# Patient Record
Sex: Female | Born: 1960
Health system: Southern US, Community
[De-identification: ages and names within clinical notes are randomized; demographics above are authoritative.]

## PROBLEM LIST (undated history)

## (undated) DIAGNOSIS — F419 Anxiety disorder, unspecified: Secondary | ICD-10-CM

## (undated) DIAGNOSIS — F32A Depression, unspecified: Secondary | ICD-10-CM

## (undated) DIAGNOSIS — M1990 Unspecified osteoarthritis, unspecified site: Secondary | ICD-10-CM

## (undated) DIAGNOSIS — T7840XA Allergy, unspecified, initial encounter: Secondary | ICD-10-CM

## (undated) DIAGNOSIS — E669 Obesity, unspecified: Secondary | ICD-10-CM

## (undated) DIAGNOSIS — F329 Major depressive disorder, single episode, unspecified: Secondary | ICD-10-CM

## (undated) DIAGNOSIS — M329 Systemic lupus erythematosus, unspecified: Secondary | ICD-10-CM

## (undated) DIAGNOSIS — R350 Frequency of micturition: Secondary | ICD-10-CM

## (undated) DIAGNOSIS — K219 Gastro-esophageal reflux disease without esophagitis: Secondary | ICD-10-CM

## (undated) DIAGNOSIS — I7300 Raynaud's syndrome without gangrene: Secondary | ICD-10-CM

## (undated) DIAGNOSIS — F17201 Nicotine dependence, unspecified, in remission: Secondary | ICD-10-CM

## (undated) DIAGNOSIS — M349 Systemic sclerosis, unspecified: Secondary | ICD-10-CM

## (undated) DIAGNOSIS — IMO0002 Reserved for concepts with insufficient information to code with codable children: Secondary | ICD-10-CM

## (undated) DIAGNOSIS — R7303 Prediabetes: Secondary | ICD-10-CM

## (undated) DIAGNOSIS — K5731 Diverticulosis of large intestine without perforation or abscess with bleeding: Secondary | ICD-10-CM

## (undated) DIAGNOSIS — I10 Essential (primary) hypertension: Secondary | ICD-10-CM

## (undated) DIAGNOSIS — I739 Peripheral vascular disease, unspecified: Secondary | ICD-10-CM

## (undated) DIAGNOSIS — F418 Other specified anxiety disorders: Secondary | ICD-10-CM

## (undated) DIAGNOSIS — K5730 Diverticulosis of large intestine without perforation or abscess without bleeding: Secondary | ICD-10-CM

## (undated) HISTORY — DX: Systemic sclerosis, unspecified: M34.9

## (undated) HISTORY — PX: VASCULAR SURGERY: SHX849

## (undated) HISTORY — DX: Raynaud's syndrome without gangrene: I73.00

## (undated) HISTORY — DX: Gastro-esophageal reflux disease without esophagitis: K21.9

## (undated) HISTORY — DX: Systemic lupus erythematosus, unspecified: M32.9

## (undated) HISTORY — DX: Other specified anxiety disorders: F41.8

## (undated) HISTORY — PX: FINGER AMPUTATION: SHX636

## (undated) HISTORY — DX: Nicotine dependence, unspecified, in remission: F17.201

## (undated) HISTORY — DX: Depression, unspecified: F32.A

## (undated) HISTORY — DX: Essential (primary) hypertension: I10

## (undated) HISTORY — DX: Major depressive disorder, single episode, unspecified: F32.9

## (undated) HISTORY — DX: Allergy, unspecified, initial encounter: T78.40XA

## (undated) HISTORY — DX: Prediabetes: R73.03

## (undated) HISTORY — DX: Anxiety disorder, unspecified: F41.9

## (undated) HISTORY — DX: Obesity, unspecified: E66.9

## (undated) HISTORY — PX: HERNIA REPAIR: SHX51

## (undated) HISTORY — DX: Reserved for concepts with insufficient information to code with codable children: IMO0002

## (undated) HISTORY — PX: TUBAL LIGATION: SHX77

---

## 1988-03-07 HISTORY — PX: ABDOMINAL HYSTERECTOMY: SHX81

## 1988-03-07 HISTORY — PX: HAND SURGERY: SHX662

## 1994-03-07 DIAGNOSIS — I10 Essential (primary) hypertension: Secondary | ICD-10-CM

## 1994-03-07 HISTORY — DX: Essential (primary) hypertension: I10

## 1996-03-07 DIAGNOSIS — M329 Systemic lupus erythematosus, unspecified: Secondary | ICD-10-CM

## 1996-03-07 DIAGNOSIS — M349 Systemic sclerosis, unspecified: Secondary | ICD-10-CM

## 1996-03-07 HISTORY — DX: Systemic lupus erythematosus, unspecified: M32.9

## 1996-03-07 HISTORY — DX: Systemic sclerosis, unspecified: M34.9

## 1999-07-30 ENCOUNTER — Encounter: Payer: Self-pay | Admitting: Family Medicine

## 1999-07-30 ENCOUNTER — Ambulatory Visit (HOSPITAL_COMMUNITY): Admission: RE | Admit: 1999-07-30 | Discharge: 1999-07-30 | Payer: Self-pay | Admitting: Family Medicine

## 2000-10-28 ENCOUNTER — Encounter: Payer: Self-pay | Admitting: Emergency Medicine

## 2000-10-28 ENCOUNTER — Emergency Department (HOSPITAL_COMMUNITY): Admission: EM | Admit: 2000-10-28 | Discharge: 2000-10-28 | Payer: Self-pay | Admitting: Emergency Medicine

## 2000-11-17 ENCOUNTER — Emergency Department (HOSPITAL_COMMUNITY): Admission: EM | Admit: 2000-11-17 | Discharge: 2000-11-17 | Payer: Self-pay | Admitting: *Deleted

## 2000-11-17 ENCOUNTER — Encounter: Payer: Self-pay | Admitting: *Deleted

## 2000-12-28 ENCOUNTER — Encounter: Admission: RE | Admit: 2000-12-28 | Discharge: 2000-12-28 | Payer: Self-pay | Admitting: Family Medicine

## 2000-12-28 ENCOUNTER — Encounter: Payer: Self-pay | Admitting: Family Medicine

## 2001-01-17 ENCOUNTER — Other Ambulatory Visit: Admission: RE | Admit: 2001-01-17 | Discharge: 2001-01-17 | Payer: Self-pay | Admitting: Obstetrics and Gynecology

## 2001-03-27 ENCOUNTER — Emergency Department (HOSPITAL_COMMUNITY): Admission: EM | Admit: 2001-03-27 | Discharge: 2001-03-27 | Payer: Self-pay | Admitting: Emergency Medicine

## 2001-03-27 ENCOUNTER — Encounter: Payer: Self-pay | Admitting: Emergency Medicine

## 2002-03-22 ENCOUNTER — Emergency Department (HOSPITAL_COMMUNITY): Admission: EM | Admit: 2002-03-22 | Discharge: 2002-03-22 | Payer: Self-pay | Admitting: Emergency Medicine

## 2002-03-29 ENCOUNTER — Ambulatory Visit (HOSPITAL_COMMUNITY): Admission: RE | Admit: 2002-03-29 | Discharge: 2002-03-29 | Payer: Self-pay | Admitting: Family Medicine

## 2002-03-29 ENCOUNTER — Encounter: Payer: Self-pay | Admitting: Family Medicine

## 2002-05-02 ENCOUNTER — Emergency Department (HOSPITAL_COMMUNITY): Admission: EM | Admit: 2002-05-02 | Discharge: 2002-05-02 | Payer: Self-pay | Admitting: Pediatrics

## 2003-01-15 ENCOUNTER — Ambulatory Visit (HOSPITAL_COMMUNITY): Admission: RE | Admit: 2003-01-15 | Discharge: 2003-01-15 | Payer: Self-pay | Admitting: Obstetrics and Gynecology

## 2003-02-17 ENCOUNTER — Emergency Department (HOSPITAL_COMMUNITY): Admission: EM | Admit: 2003-02-17 | Discharge: 2003-02-17 | Payer: Self-pay | Admitting: Emergency Medicine

## 2003-03-08 HISTORY — PX: COLONOSCOPY: SHX174

## 2003-06-19 ENCOUNTER — Ambulatory Visit (HOSPITAL_COMMUNITY): Admission: RE | Admit: 2003-06-19 | Discharge: 2003-06-19 | Payer: Self-pay | Admitting: Family Medicine

## 2003-10-31 ENCOUNTER — Emergency Department (HOSPITAL_COMMUNITY): Admission: EM | Admit: 2003-10-31 | Discharge: 2003-10-31 | Payer: Self-pay | Admitting: Emergency Medicine

## 2003-11-13 ENCOUNTER — Ambulatory Visit (HOSPITAL_COMMUNITY): Admission: RE | Admit: 2003-11-13 | Discharge: 2003-11-13 | Payer: Self-pay | Admitting: Internal Medicine

## 2003-12-05 ENCOUNTER — Encounter: Payer: Self-pay | Admitting: Family Medicine

## 2003-12-05 ENCOUNTER — Ambulatory Visit (HOSPITAL_COMMUNITY): Admission: RE | Admit: 2003-12-05 | Discharge: 2003-12-05 | Payer: Self-pay | Admitting: Internal Medicine

## 2004-10-04 ENCOUNTER — Ambulatory Visit (HOSPITAL_COMMUNITY): Admission: RE | Admit: 2004-10-04 | Discharge: 2004-10-04 | Payer: Self-pay | Admitting: Family Medicine

## 2004-12-08 ENCOUNTER — Emergency Department (HOSPITAL_COMMUNITY): Admission: EM | Admit: 2004-12-08 | Discharge: 2004-12-08 | Payer: Self-pay | Admitting: Emergency Medicine

## 2005-10-20 ENCOUNTER — Ambulatory Visit (HOSPITAL_COMMUNITY): Admission: RE | Admit: 2005-10-20 | Discharge: 2005-10-20 | Payer: Self-pay | Admitting: Family Medicine

## 2006-11-14 ENCOUNTER — Ambulatory Visit (HOSPITAL_COMMUNITY): Admission: RE | Admit: 2006-11-14 | Discharge: 2006-11-14 | Payer: Self-pay | Admitting: Family Medicine

## 2006-12-13 ENCOUNTER — Other Ambulatory Visit: Admission: RE | Admit: 2006-12-13 | Discharge: 2006-12-13 | Payer: Self-pay | Admitting: Obstetrics and Gynecology

## 2007-03-19 ENCOUNTER — Encounter (HOSPITAL_COMMUNITY): Admission: RE | Admit: 2007-03-19 | Discharge: 2007-04-18 | Payer: Self-pay | Admitting: Family Medicine

## 2007-06-06 ENCOUNTER — Emergency Department (HOSPITAL_COMMUNITY): Admission: EM | Admit: 2007-06-06 | Discharge: 2007-06-06 | Payer: Self-pay | Admitting: Emergency Medicine

## 2007-06-12 ENCOUNTER — Encounter: Admission: RE | Admit: 2007-06-12 | Discharge: 2007-06-12 | Payer: Self-pay | Admitting: Family Medicine

## 2007-06-22 ENCOUNTER — Ambulatory Visit (HOSPITAL_COMMUNITY): Admission: RE | Admit: 2007-06-22 | Discharge: 2007-06-22 | Payer: Self-pay | Admitting: Family Medicine

## 2007-08-11 ENCOUNTER — Emergency Department (HOSPITAL_COMMUNITY): Admission: EM | Admit: 2007-08-11 | Discharge: 2007-08-11 | Payer: Self-pay | Admitting: Emergency Medicine

## 2007-12-04 ENCOUNTER — Ambulatory Visit (HOSPITAL_COMMUNITY): Admission: RE | Admit: 2007-12-04 | Discharge: 2007-12-04 | Payer: Self-pay | Admitting: Family Medicine

## 2008-01-15 ENCOUNTER — Other Ambulatory Visit: Admission: RE | Admit: 2008-01-15 | Discharge: 2008-01-15 | Payer: Self-pay | Admitting: Obstetrics and Gynecology

## 2008-05-14 ENCOUNTER — Emergency Department (HOSPITAL_COMMUNITY): Admission: EM | Admit: 2008-05-14 | Discharge: 2008-05-14 | Payer: Self-pay | Admitting: Emergency Medicine

## 2008-05-27 ENCOUNTER — Emergency Department (HOSPITAL_COMMUNITY): Admission: EM | Admit: 2008-05-27 | Discharge: 2008-05-27 | Payer: Self-pay | Admitting: Emergency Medicine

## 2008-12-17 ENCOUNTER — Ambulatory Visit (HOSPITAL_COMMUNITY): Admission: RE | Admit: 2008-12-17 | Discharge: 2008-12-17 | Payer: Self-pay | Admitting: Family Medicine

## 2009-01-23 ENCOUNTER — Encounter: Payer: Self-pay | Admitting: Family Medicine

## 2009-01-23 ENCOUNTER — Other Ambulatory Visit: Admission: RE | Admit: 2009-01-23 | Discharge: 2009-01-23 | Payer: Self-pay | Admitting: Obstetrics and Gynecology

## 2009-04-02 ENCOUNTER — Ambulatory Visit: Payer: Self-pay | Admitting: Family Medicine

## 2009-04-02 DIAGNOSIS — F329 Major depressive disorder, single episode, unspecified: Secondary | ICD-10-CM

## 2009-04-02 DIAGNOSIS — I10 Essential (primary) hypertension: Secondary | ICD-10-CM

## 2009-04-27 ENCOUNTER — Telehealth: Payer: Self-pay | Admitting: Family Medicine

## 2009-05-01 ENCOUNTER — Ambulatory Visit: Payer: Self-pay | Admitting: Family Medicine

## 2009-05-01 DIAGNOSIS — R51 Headache: Secondary | ICD-10-CM

## 2009-05-01 DIAGNOSIS — R519 Headache, unspecified: Secondary | ICD-10-CM | POA: Insufficient documentation

## 2009-05-20 ENCOUNTER — Telehealth: Payer: Self-pay | Admitting: Family Medicine

## 2009-05-21 ENCOUNTER — Ambulatory Visit: Payer: Self-pay | Admitting: Family Medicine

## 2009-06-04 ENCOUNTER — Ambulatory Visit: Payer: Self-pay | Admitting: Family Medicine

## 2009-07-14 ENCOUNTER — Emergency Department (HOSPITAL_COMMUNITY): Admission: EM | Admit: 2009-07-14 | Discharge: 2009-07-14 | Payer: Self-pay | Admitting: Emergency Medicine

## 2009-08-06 ENCOUNTER — Ambulatory Visit: Payer: Self-pay | Admitting: Family Medicine

## 2009-08-14 ENCOUNTER — Ambulatory Visit: Payer: Self-pay | Admitting: Family Medicine

## 2009-08-14 DIAGNOSIS — G4700 Insomnia, unspecified: Secondary | ICD-10-CM

## 2009-08-20 ENCOUNTER — Ambulatory Visit (HOSPITAL_COMMUNITY): Admission: RE | Admit: 2009-08-20 | Discharge: 2009-08-20 | Payer: Self-pay | Admitting: Family Medicine

## 2009-08-20 ENCOUNTER — Ambulatory Visit: Payer: Self-pay | Admitting: Family Medicine

## 2009-08-20 DIAGNOSIS — K3189 Other diseases of stomach and duodenum: Secondary | ICD-10-CM

## 2009-08-20 DIAGNOSIS — R05 Cough: Secondary | ICD-10-CM

## 2009-08-20 DIAGNOSIS — R110 Nausea: Secondary | ICD-10-CM

## 2009-08-20 DIAGNOSIS — R1013 Epigastric pain: Secondary | ICD-10-CM

## 2009-08-20 DIAGNOSIS — R5383 Other fatigue: Secondary | ICD-10-CM | POA: Insufficient documentation

## 2009-08-20 LAB — CONVERTED CEMR LAB
Basophils Absolute: 0.1 10*3/uL (ref 0.0–0.1)
Basophils Relative: 1 % (ref 0–1)
Eosinophils Absolute: 0.1 10*3/uL (ref 0.0–0.7)
Eosinophils Relative: 1 % (ref 0–5)
HCT: 39 % (ref 36.0–46.0)
Lymphocytes Relative: 19 % (ref 12–46)
MCHC: 33.3 g/dL (ref 30.0–36.0)
Platelets: 428 10*3/uL — ABNORMAL HIGH (ref 150–400)
RDW: 13.3 % (ref 11.5–15.5)

## 2009-08-26 ENCOUNTER — Ambulatory Visit (HOSPITAL_COMMUNITY): Admission: RE | Admit: 2009-08-26 | Discharge: 2009-08-26 | Payer: Self-pay | Admitting: Family Medicine

## 2009-08-26 ENCOUNTER — Ambulatory Visit: Payer: Self-pay | Admitting: Family Medicine

## 2009-08-31 ENCOUNTER — Encounter: Payer: Self-pay | Admitting: Family Medicine

## 2009-09-04 ENCOUNTER — Ambulatory Visit (HOSPITAL_COMMUNITY): Admission: RE | Admit: 2009-09-04 | Discharge: 2009-09-04 | Payer: Self-pay | Admitting: Pulmonary Disease

## 2009-09-10 ENCOUNTER — Ambulatory Visit: Payer: Self-pay | Admitting: Family Medicine

## 2009-11-10 ENCOUNTER — Telehealth: Payer: Self-pay | Admitting: Family Medicine

## 2009-11-12 ENCOUNTER — Ambulatory Visit: Payer: Self-pay | Admitting: Family Medicine

## 2009-11-12 DIAGNOSIS — G4730 Sleep apnea, unspecified: Secondary | ICD-10-CM | POA: Insufficient documentation

## 2009-11-12 DIAGNOSIS — J209 Acute bronchitis, unspecified: Secondary | ICD-10-CM

## 2009-12-07 ENCOUNTER — Encounter: Payer: Self-pay | Admitting: Family Medicine

## 2009-12-24 ENCOUNTER — Encounter: Payer: Self-pay | Admitting: Family Medicine

## 2009-12-24 ENCOUNTER — Ambulatory Visit: Admission: RE | Admit: 2009-12-24 | Discharge: 2009-12-24 | Payer: Self-pay | Admitting: Family Medicine

## 2009-12-24 ENCOUNTER — Ambulatory Visit (HOSPITAL_COMMUNITY): Admission: RE | Admit: 2009-12-24 | Discharge: 2009-12-24 | Payer: Self-pay | Admitting: Family Medicine

## 2010-01-14 ENCOUNTER — Encounter: Payer: Self-pay | Admitting: Family Medicine

## 2010-01-25 ENCOUNTER — Emergency Department (HOSPITAL_COMMUNITY): Admission: EM | Admit: 2010-01-25 | Discharge: 2010-01-25 | Payer: Self-pay | Admitting: Emergency Medicine

## 2010-01-26 ENCOUNTER — Encounter: Payer: Self-pay | Admitting: Family Medicine

## 2010-01-26 ENCOUNTER — Other Ambulatory Visit: Admission: RE | Admit: 2010-01-26 | Discharge: 2010-01-26 | Payer: Self-pay | Admitting: Obstetrics and Gynecology

## 2010-01-27 ENCOUNTER — Encounter: Payer: Self-pay | Admitting: Family Medicine

## 2010-03-16 ENCOUNTER — Ambulatory Visit
Admission: RE | Admit: 2010-03-16 | Discharge: 2010-03-16 | Payer: Self-pay | Source: Home / Self Care | Attending: Family Medicine | Admitting: Family Medicine

## 2010-03-29 ENCOUNTER — Encounter: Payer: Self-pay | Admitting: Family Medicine

## 2010-04-08 NOTE — Assessment & Plan Note (Signed)
Summary: sick -room 2   Vital Signs:  Patient profile:   50 year old female Height:      66 inches Weight:      233.25 pounds BMI:     37.78 O2 Sat:      98 % on Room air Pulse rate:   93 / minute Resp:     16 per minute BP sitting:   100 / 68  (left arm)  Vitals Entered By: Adella Hare LPN (August 06, 1608 8:50 AM) CC: cough, chills Is Patient Diabetic? No Pain Assessment Patient in pain? no      Comments did not bring meds to ov   CC:  cough and chills.  History of Present Illness: Pt presents today with c/o a cough. She developed hoarseness 5 days ago & it has persisted.  A couple days later she started with a  cough.  The cough has worsened & is productive with thick discolored phlegm.  She occasionally is blowing her nose, but states she doesn't really have much nasal congestion. No post nasal drainage. No fever.  She is not using an over the counter cold meds.   Allergies (verified): 1)  ! Codeine  Past History:  Past medical history reviewed for relevance to current acute and chronic problems.  Past Medical History: Reviewed history from 04/02/2009 and no changes required. Hypertension x 1996 depression [facility]  1998 scleroderma  Dr bland  1998 Disabled since  1998 OBESITY  Review of Systems General:  Denies chills and fever. ENT:  Denies earache, nasal congestion, postnasal drainage, sinus pressure, and sore throat. CV:  Denies chest pain or discomfort. Resp:  Complains of cough and sputum productive; denies shortness of breath and wheezing.  Physical Exam  General:  Well-developed,well-nourished,in no acute distress; alert,appropriate and cooperative throughout examination Head:  Normocephalic and atraumatic without obvious abnormalities. No apparent alopecia or balding. Ears:  External ear exam shows no significant lesions or deformities.  Otoscopic examination reveals clear canals, tympanic membranes are intact bilaterally without  bulging, retraction, inflammation or discharge. Hearing is grossly normal bilaterally. Nose:  External nasal examination shows no deformity or inflammation. Nasal mucosa are pink and moist without lesions or exudates. Mouth:  Oral mucosa and oropharynx without lesions or exudates.  Hoarse voice noted. Neck:  No deformities, masses, or tenderness noted. Lungs:  Coarse BS bilat. normal respiratory effort, no intercostal retractions, no accessory muscle use, no crackles, and no wheezes.   Heart:  Normal rate and regular rhythm. S1 and S2 normal without gallop, murmur, click, rub or other extra sounds. Cervical Nodes:  No lymphadenopathy noted Psych:  Cognition and judgment appear intact. Alert and cooperative with normal attention span and concentration. No apparent delusions, illusions, hallucinations   Impression & Recommendations:  Problem # 1:  ACUTE BRONCHITIS (ICD-466.0) Assessment New  Her updated medication list for this problem includes:    Septra Ds 800-160 Mg Tabs (Sulfamethoxazole-trimethoprim) .Marland Kitchen... Take 1 two times a day for 10 days    Benzonatate 100 Mg Caps (Benzonatate) .Marland Kitchen... Take 1 capsule every 8 hrs as needed for cough  Problem # 2:  LARYNGITIS, ACUTE (ICD-464.00) Assessment: New  Problem # 3:  UNSPECIFIED ESSENTIAL HYPERTENSION (ICD-401.9) Assessment: Comment Only  Her updated medication list for this problem includes:    Amlodipine Besylate 10 Mg Tabs (Amlodipine besylate) .Marland Kitchen... Take 1 tablet by mouth once a day  BP today: 100/68 Prior BP: 110/80 (06/04/2009)  Complete Medication List: 1)  Amlodipine Besylate 10 Mg Tabs (Amlodipine besylate) .... Take 1 tablet by mouth once a day 2)  Zolpidem Tartrate 10 Mg Tabs (Zolpidem tartrate) .... Take 1 tab by mouth at bedtime 3)  Nexium 40 Mg Cpdr (Esomeprazole magnesium) .... Take 1 tablet by mouth once a day 4)  Tramadol Hcl 50 Mg Tabs (Tramadol hcl) .Marland Kitchen.. 1 or 2 every 6 hrs as needed 5)  Zoloft 50 Mg Tabs (Sertraline  hcl) .... Take 1 tablet by mouth once a day 6)  Alprazolam 1 Mg Tabs (Alprazolam) .... Take 1 tablet by mouth three times a day 7)  Septra Ds 800-160 Mg Tabs (Sulfamethoxazole-trimethoprim) .... Take 1 two times a day for 10 days 8)  Benzonatate 100 Mg Caps (Benzonatate) .... Take 1 capsule every 8 hrs as needed for cough 9)  Fluconazole 150 Mg Tabs (Fluconazole) .... Take 1 by mouth  Patient Instructions: 1)  Keep your next appt. 2)  Get plenty of rest, drink lots of clear liquids, and use Tylenol or Ibuprofen for fever and comfort. Return in 7-10 days if you're not better:sooner if you're feeling worse. 3)  Increase your fluids and voice rest for your laryngitis. 4)  I have prescribed an antibiotic and cough pill for you. 5)  It is important that you exercise regularly at least 20 minutes 5 times a week. If you develop chest pain, have severe difficulty breathing, or feel very tired , stop exercising immediately and seek medical attention. 6)  You need to lose weight. Consider a lower calorie diet and regular exercise.  Prescriptions: FLUCONAZOLE 150 MG TABS (FLUCONAZOLE) take 1 by mouth  #1 x 0   Entered and Authorized by:   Esperanza Sheets PA   Signed by:   Esperanza Sheets PA on 08/06/2009   Method used:   Electronically to        [facility] (retail)       [address]       [place], [place]  [address]       Ph: [phone] or [phone]       Fax: [phone]   RxID:   [id number] BENZONATATE 100 MG CAPS (BENZONATATE) take 1 capsule every 8 hrs as needed for cough  #30 x 0   Entered and Authorized by:   Esperanza Sheets PA   Signed by:   Esperanza Sheets PA on 08/06/2009   Method used:   Electronically to        [facility] (retail)       [address]       [place], [place]  [address]       Ph: [phone] or [phone]       Fax: [phone]   RxID:   [id number] SEPTRA DS 800-160 MG TABS  (SULFAMETHOXAZOLE-TRIMETHOPRIM) take 1 two times a day for 10 days  #20 x 0   Entered and Authorized by:   Esperanza Sheets PA   Signed by:   Esperanza Sheets PA on 08/06/2009   Method used:   Electronically to        [facility] (retail)       [address]       [place], [place]  [address]       Ph: [phone] or [phone]       Fax: [phone]   RxID:   [id number]

## 2010-04-08 NOTE — Assessment & Plan Note (Signed)
Summary: SICK- room 2   Vital Signs:  Patient profile:   50 year old female Height:      66 inches Weight:      233 pounds BMI:     37.74 Pulse rate:   77 / minute Resp:     16 per minute BP sitting:   100 / 70  (left arm)  Vitals Entered By: Adella Hare LPN (May 21, 2009 4:45 PM) CC: cough and runny nose Is Patient Diabetic? No Pain Assessment Patient in pain? no        CC:  cough and runny nose.  History of Present Illness: Pt is here today with c/o cough.  This started 1-2 wks ago.  Feels like what she had in Jan & the medicines Dr Lodema Hong gave her worked well.  The cough is mostly nonprod, but occasionally brings up some clear phlegm.  Also having some nasal congestion & drainage.  No fever or chills.  Pt is frustrated that she gained wt since her last visit here.  States she has been eating healthier. Eating salads for lunch with vegetable crackers.  Current Medications (verified): 1)  Amlodipine Besylate 10 Mg Tabs (Amlodipine Besylate) .... Take 1 Tablet By Mouth Once A Day 2)  Zolpidem Tartrate 10 Mg Tabs (Zolpidem Tartrate) .... Take 1 Tab By Mouth At Bedtime 3)  Nexium 40 Mg Cpdr (Esomeprazole Magnesium) .... Take 1 Tablet By Mouth Once A Day 4)  Tramadol Hcl 50 Mg Tabs (Tramadol Hcl) .Marland Kitchen.. 1 or 2 Every 6 Hrs As Needed 5)  Zoloft 50 Mg Tabs (Sertraline Hcl) .... Take 1 Tablet By Mouth Once A Day 6)  Alprazolam 1 Mg Tabs (Alprazolam) .... Take 1 Tablet By Mouth Three Times A Day 7)  Septra Ds 800-160 Mg Tabs (Sulfamethoxazole-Trimethoprim) .... Take 1 Tablet By Mouth Two Times A Day 8)  Tessalon Perles 100 Mg Caps (Benzonatate) .... Take 1 Capsule By Mouth Three Times A Day 9)  Fluconazole 150 Mg Tabs (Fluconazole) .... Take 1 Tablet By Mouth Once A Day As Needed  Allergies (verified): 1)  ! Codeine  Past History:  Past medical history reviewed for relevance to current acute and chronic problems.  Past Medical History: Reviewed history from 04/02/2009 and  no changes required. Hypertension x 1996 [facility]  [facility]  1998 scleroderma  Dr bland  1998 Disabled since  1998 OBESITY  Review of Systems General:  Denies chills and fever. ENT:  Complains of nasal congestion and postnasal drainage; denies earache, sinus pressure, and sore throat. CV:  Denies chest pain or discomfort and palpitations. Resp:  Complains of cough and sputum productive; denies shortness of breath and wheezing. Heme:  Denies enlarge lymph nodes.  Physical Exam  General:  Well-developed,well-nourished,in no acute distress; alert,appropriate and cooperative throughout examination  Hoarse voice noted. Head:  Normocephalic and atraumatic without obvious abnormalities. No apparent alopecia or balding. Ears:  External ear exam shows no significant lesions or deformities.  Otoscopic examination reveals clear canals, tympanic membranes are intact bilaterally without bulging, retraction, inflammation or discharge. Hearing is grossly normal bilaterally. Nose:  External nasal examination shows no deformity or inflammation. Nasal mucosa are pink and moist without lesions or exudates. Mouth:  Oral mucosa and oropharynx without lesions or exudates.  Teeth in good repair. Neck:  No deformities, masses, or tenderness noted. Lungs:  Normal respiratory effort, chest expands symmetrically. Lungs are clear to auscultation, no crackles or wheezes. Heart:  Normal rate and regular rhythm. S1  and S2 normal without gallop, murmur, click, rub or other extra sounds. Cervical Nodes:  No lymphadenopathy noted Psych:  Cognition and judgment appear intact. Alert and cooperative with normal attention span and concentration. No apparent delusions, illusions, hallucinations   Impression & Recommendations:  Problem # 1:  ACUTE SINUSITIS, UNSPECIFIED (ICD-461.9) Assessment New  Her updated medication list for this problem includes:    Septra Ds 800-160 Mg Tabs  (Sulfamethoxazole-trimethoprim) .Marland Kitchen... Take 1 tablet by mouth two times a day    Tessalon Perles 100 Mg Caps (Benzonatate) .Marland Kitchen... Take 1 capsule by mouth three times a day  Problem # 2:  ACUTE BRONCHITIS (ICD-466.0) Assessment: New  Her updated medication list for this problem includes:    Septra Ds 800-160 Mg Tabs (Sulfamethoxazole-trimethoprim) .Marland Kitchen... Take 1 tablet by mouth two times a day    Tessalon Perles 100 Mg Caps (Benzonatate) .Marland Kitchen... Take 1 capsule by mouth three times a day  Problem # 3:  OBESITY (ICD-278.00) Assessment: Deteriorated  Ht: 66 (05/21/2009)   Wt: 233 (05/21/2009)   BMI: 37.74 (05/21/2009)  Discussed diet.  Specifically salads & hidden calories & fat, even though they seem healthy.  Also discussed with her that the vegetable crackers that she is eating are mostly carbs.  She needs to look at the nutrition label to see what the serving size is, calories, etc.  Complete Medication List: 1)  Amlodipine Besylate 10 Mg Tabs (Amlodipine besylate) .... Take 1 tablet by mouth once a day 2)  Zolpidem Tartrate 10 Mg Tabs (Zolpidem tartrate) .... Take 1 tab by mouth at bedtime 3)  Nexium 40 Mg Cpdr (Esomeprazole magnesium) .... Take 1 tablet by mouth once a day 4)  Tramadol Hcl 50 Mg Tabs (Tramadol hcl) .Marland Kitchen.. 1 or 2 every 6 hrs as needed 5)  Zoloft 50 Mg Tabs (Sertraline hcl) .... Take 1 tablet by mouth once a day 6)  Alprazolam 1 Mg Tabs (Alprazolam) .... Take 1 tablet by mouth three times a day 7)  Septra Ds 800-160 Mg Tabs (Sulfamethoxazole-trimethoprim) .... Take 1 tablet by mouth two times a day 8)  Tessalon Perles 100 Mg Caps (Benzonatate) .... Take 1 capsule by mouth three times a day 9)  Fluconazole 150 Mg Tabs (Fluconazole) .... Take 1 tablet by mouth once a day as needed  Patient Instructions: 1)  Please schedule a follow-up appointment as needed. 2)  Get plenty of rest, drink lots of clear liquids, and use Tylenol or Ibuprofen for fever and comfort. Return in 7-10 days  if you're not better:sooner if you're feeling worse. 3)  I have sent your prescriptions to [facility]. Prescriptions: SEPTRA DS 800-160 MG TABS (SULFAMETHOXAZOLE-TRIMETHOPRIM) Take 1 tablet by mouth two times a day  #20 x 0   Entered and Authorized by:   Esperanza Sheets PA   Signed by:   Esperanza Sheets PA on 05/21/2009   Method used:   Electronically to        [facility] (retail)       [address]       [place], [place]  [address]       Ph: [phone] or [phone]       Fax: [phone]   RxID:   [id number] TESSALON PERLES 100 MG CAPS (BENZONATATE) Take 1 capsule by mouth three times a day  #30 x 0   Entered and Authorized by:   Esperanza Sheets PA   Signed by:   Esperanza Sheets  PA on 05/21/2009   Method used:   Electronically to        [facility] (retail)       [address]       [place], [place]  [address]       Ph: [phone] or [phone]       Fax: [phone]   RxID:   [id number] FLUCONAZOLE 150 MG TABS (FLUCONAZOLE) Take 1 tablet by mouth once a day as needed  #2 x 0   Entered and Authorized by:   Esperanza Sheets PA   Signed by:   Esperanza Sheets PA on 05/21/2009   Method used:   Electronically to        [facility] (retail)       [address]       [place], [place]  [address]       Ph: [phone] or [phone]       Fax: [phone]   RxID:   [id number]

## 2010-04-08 NOTE — Assessment & Plan Note (Signed)
Summary: F UP   Vital Signs:  Patient profile:   50 year old female Menstrual status:  hysterectomy Height:      66 inches Weight:      228.75 pounds BMI:     37.05 Pulse rate:   73 / minute Pulse rhythm:   regular Resp:     16 per minute BP sitting:   110 / 80  (left arm)  Vitals Entered By: Adella Hare LPN (September 11, 8411 11:08 AM)  Nutrition Counseling: Patient's BMI is greater than 25 and therefore counseled on weight management options. CC: follow-up visit Is Patient Diabetic? No Pain Assessment Patient in pain? no      Comments did not bring meds to ov     Menstrual Status hysterectomy   CC:  follow-up visit.  History of Present Illness: Pt reports that her cough is much better, she has seen the pulmonary specialist who prescribed no new meds, questioned the possibility of pulmonary ds, a restrictive type based on her rheumatologic history, and orderd pulmonary function testing. The reports of the study is non commitalas far as any pulmonary ds is concerned. thept has 2 separate dx iin her medical record, and i would like clarification and so does she. She reportedly has both lupus and the crest synd, attemp[ts will be mande to obtain relevant documents to sort this out. Shedenies depression oranxiety, she takes meds and sees a Energy manager. seh denies any fever or chills, excessive head congestion or sore throat. She does have postr nasal drainage, and also occasional cough which is for the most part , non productive.   Allergies (verified): 1)  ! Codeine  Review of Systems      See HPI General:  Complains of fatigue and sleep disorder; controlled by medivcation. Eyes:  Denies discharge and red eye. CV:  Denies chest pain or discomfort, palpitations, and swelling of feet. GI:  Denies abdominal pain, constipation, diarrhea, nausea, and vomiting. GU:  Denies dysuria and urinary frequency. MS:  Denies joint pain and stiffness. Endo:  Denies cold  intolerance, excessive hunger, excessive thirst, excessive urination, heat intolerance, polyuria, and weight change. Heme:  Denies abnormal bruising and bleeding. Allergy:  Complains of seasonal allergies.  Physical Exam  General:  Well-developed,overweight,in no acute distress; alert,appropriate and cooperative throughout examination HEENT: No facial asymmetry,  EOMI, No sinus tenderness, TM's Clear, oropharynx  pink and moist. Mild erythema and edema of nasal mucosa  Chest: Clear to auscultation bilaterally.  CVS: S1, S2, No murmurs, No S3.   Abd: Soft, Nontender.  MS: Adequate ROM spine, hips, shoulders and knees.  Ext: No edema.   CNS: CN 2-12 intact, power tone and sensation normal throughout.   Skin: Intact, no visible lesions or rashes.  Psych: Good eye contact, normal affect.  Memory intact, not anxious or depressed appearing.    Impression & Recommendations:  Problem # 1:  COUGH (ICD-786.2) Assessment Improved my feeling is that the pt may have been infected with bordetella  Problem # 2:  INSOMNIA (ICD-780.52) Assessment: Improved  Her updated medication list for this problem includes:    Zolpidem Tartrate 10 Mg Tabs (Zolpidem tartrate) .Marland Kitchen... Take 1 tab by mouth at bedtime  Discussed sleep hygiene.   Problem # 3:  DEPRESSION (ICD-311) Assessment: Improved  Her updated medication list for this problem includes:    Zoloft 50 Mg Tabs (Sertraline hcl) .Marland Kitchen... Take 1 tablet by mouth once a day    Alprazolam 1 Mg Tabs (Alprazolam) .Marland KitchenMarland KitchenMarland KitchenMarland Kitchen  Take 1 tablet by mouth three times a day  Problem # 4:  UNSPECIFIED ESSENTIAL HYPERTENSION (ICD-401.9) Assessment: Unchanged  Her updated medication list for this problem includes:    Amlodipine Besylate 10 Mg Tabs (Amlodipine besylate) .Marland Kitchen... Take 1 tablet by mouth once a day  BP today: 110/80 Prior BP: 112/60 (08/26/2009)  Problem # 5:  OBESITY (ICD-278.00) Assessment: Deteriorated  Ht: 66 (09/10/2009)   Wt: 228.75 (09/10/2009)    BMI: 37.05 (09/10/2009)  Complete Medication List: 1)  Amlodipine Besylate 10 Mg Tabs (Amlodipine besylate) .... Take 1 tablet by mouth once a day 2)  Zolpidem Tartrate 10 Mg Tabs (Zolpidem tartrate) .... Take 1 tab by mouth at bedtime 3)  Nexium 40 Mg Cpdr (Esomeprazole magnesium) .... Take 1 tablet by mouth once a day 4)  Tramadol Hcl 50 Mg Tabs (Tramadol hcl) .Marland Kitchen.. 1 or 2 every 6 hrs as needed 5)  Zoloft 50 Mg Tabs (Sertraline hcl) .... Take 1 tablet by mouth once a day 6)  Alprazolam 1 Mg Tabs (Alprazolam) .... Take 1 tablet by mouth three times a day 7)  Clarithromycin 500 Mg Tabs (Clarithromycin) .... Take 1 tablet by mouth two times a day 8)  Ventolin Hfa 108 (90 Base) Mcg/act Aers (Albuterol sulfate) .... Use 2 puffs every 4 hours as needed 9)  Hydrocodone-acetaminophen 5-500 Mg Tabs (Hydrocodone-acetaminophen) .... Take 1 every 6 hrs as needed for cough and rib pain  Patient Instructions: 1)  Please schedule a follow-up appointment in 2 months. 2)  It is important that you exercise regularly at least 20 minutes 5 times a week. If you develop chest pain, have severe difficulty breathing, or feel very tired , stop exercising immediately and seek medical attention. 3)  You need to lose weight. Consider a lower calorie diet and regular exercise.  4)  I will send for your record from [facility]. 5)  i will send for the lung function study you recently had done

## 2010-04-08 NOTE — Op Note (Signed)
Summary: colonoscpy with biopsy  colonoscpy with biopsy   Imported By: Lind Guest 04/07/2009 09:49:46  _____________________________________________________________________  External Attachment:    Type:   Image     Comment:   External Document

## 2010-04-08 NOTE — Miscellaneous (Signed)
Summary: FLU SHOT  Clinical Lists Changes  Observations: Added new observation of FLU VAX: Fluvax Non-MCR (12/04/2009 11:07)      Influenza Immunization History:    Influenza # 1:  Fluvax Non-MCR (12/04/2009)

## 2010-04-08 NOTE — Assessment & Plan Note (Signed)
Summary: sick?- room 1   Vital Signs:  Patient profile:   50 year old female O2 Sat:      97 % on Room air Pulse rate:   90 / minute Resp:     16 per minute BP sitting:   112 / 60  (left arm)  Vitals Entered By: Adella Hare LPN (August 26, 2009 1:39 PM) CC: dizzy and feeling bad Is Patient Diabetic? No Comments did not bring meds to ov   CC:  dizzy and feeling bad.  History of Present Illness: Pt presents today with c/o cough.  This is her 4th visit in the last 20 days for the same.  Cough is worse with lying down.  Spits up clear phlegm.She has taken 2 different antibiotics and has had Depo Medrol and oral steroids without improvement.  Pt states the cough makes her throw up. Also vomits if eats or drinks anything.  Hydrocodone some help with cough, and was able to sleep.  Also tried Tessalon, but no help.  Rib cage is now hurting too.  Worse with cough and deep breath.  Pts CBC and CXR neg. Abd [place] also Neg.  A HIDA scan has been ordered for her.   Allergies (verified): 1)  ! Codeine  Past History:  Past medical history reviewed for relevance to current acute and chronic problems.  Past Medical History: Reviewed history from 04/02/2009 and no changes required. Hypertension x 1996 depression anxiety [facility]ERD [facility]  [facility]  1998 scleroderma  Dr bland  1998 Disabled since  1998 OBESITY  Review of Systems General:  Denies chills and fever. ENT:  Complains of nasal congestion; denies earache, postnasal drainage, sinus pressure, and sore throat. CV:  Complains of chest pain or discomfort; denies palpitations. Resp:  Complains of cough, shortness of breath, and sputum productive; denies wheezing.  Physical Exam  General:  alert and overweight-appearing.  Frequent cough noted during visit and pt appears to not feel well but  NAD.  Head:  Normocephalic and atraumatic without obvious abnormalities. No apparent alopecia or balding. Ears:  External ear exam shows no significant  lesions or deformities.  Otoscopic examination reveals clear canals, tympanic membranes are intact bilaterally without bulging, retraction, inflammation or discharge. Hearing is grossly normal bilaterally. Nose:  External nasal examination shows no deformity or inflammation. Nasal mucosa are pink and moist without lesions or exudates. Mouth:  Oral mucosa and oropharynx without lesions or exudates.  Teeth in good repair. Neck:  No deformities, masses, or tenderness noted. Lungs:  Normal respiratory effort, chest expands symmetrically. Lungs are clear to auscultation, no crackles or wheezes.  After NMT lungs are still CTA but pt does have improved air movement and cough is noted to have stopped. Heart:  Normal rate and regular rhythm. S1 and S2 normal without gallop, murmur, click, rub or other extra sounds. Cervical Nodes:  No lymphadenopathy noted Psych:  Cognition and judgment appear intact. Alert and cooperative with normal attention span and concentration. No apparent delusions, illusions, hallucinations   Impression & Recommendations:  Problem # 1:  COUGH (ICD-786.2) Assessment Unchanged Pt to complete current antibiotic prescription.   Rxd albuterol inhaler to use every 4 hrs as needed. Orders: Pulmonary Referral (Pulmonary) Nebulizer Tx ([address])  Complete Medication List: 1)  Amlodipine Besylate 10 Mg Tabs (Amlodipine besylate) .... Take 1 tablet by mouth once a day 2)  Zolpidem Tartrate 10 Mg Tabs (Zolpidem tartrate) .... Take 1 tab by mouth at bedtime 3)  Nexium 40 Mg Cpdr (  Esomeprazole magnesium) .... Take 1 tablet by mouth once a day 4)  Tramadol Hcl 50 Mg Tabs (Tramadol hcl) .Marland Kitchen.. 1 or 2 every 6 hrs as needed 5)  Zoloft 50 Mg Tabs (Sertraline hcl) .... Take 1 tablet by mouth once a day 6)  Alprazolam 1 Mg Tabs (Alprazolam) .... Take 1 tablet by mouth three times a day 7)  Clarithromycin 500 Mg Tabs (Clarithromycin) .... Take 1 tablet by mouth two times a day 8)  Ventolin Hfa 108  (90 Base) Mcg/act Aers (Albuterol sulfate) .... Use 2 puffs every 4 hours as needed 9)  Hydrocodone-acetaminophen 5-500 Mg Tabs (Hydrocodone-acetaminophen) .... Take 1 every 6 hrs as needed for cough and rib pain  Patient Instructions: 1)  Please schedule a follow-up appointment as needed. 2)  I have referred you to a pulmonologist (lung dr). 3)  I have refilled the Hydrocodone for cough.  This will also help with your rib pain. 4)  I have prescribed an inhaler for you to use. Prescriptions: HYDROCODONE-ACETAMINOPHEN 5-500 MG TABS (HYDROCODONE-ACETAMINOPHEN) take 1 every 6 hrs as needed for cough and rib pain  #30 x 0   Entered and Authorized by:   Esperanza Sheets PA   Signed by:   Esperanza Sheets PA on 08/26/2009   Method used:   Printed then faxed to ...       [facility]* (retail)       [address]       [place], [place]  [address]       Ph: [phone] or [phone]       Fax: [phone]   RxID:   [id number] VENTOLIN HFA 108 (90 BASE) MCG/ACT AERS (ALBUTEROL SULFATE) use 2 puffs every 4 hours as needed  #1 x 0   Entered and Authorized by:   Esperanza Sheets PA   Signed by:   Esperanza Sheets PA on 08/26/2009   Method used:   Electronically to        [facility]* (retail)       [address]       [place], [place]  [address]       Ph: [phone] or [phone]       Fax: [phone]   RxID:   [id number]  hydrocodone was given verbally to [facility]  Appended Document: sick?- room 1   Medication Administration  Medication # 1:    Medication: Albuterol Sulfate Sol 1mg  unit dose    Diagnosis: COUGH (ICD-786.2)    Dose: 2.5    Route: inhaled    Exp Date: 8/11    Lot #: [id number]    Mfr: nephron pharm    Patient tolerated medication without complications    Given by: Adella Hare LPN (August 26, 2009 4:40 PM)  Orders Added: 1)  Albuterol Sulfate Sol 1mg  unit dose [J7613] 2)  Nebulizer Tx [85462]

## 2010-04-08 NOTE — Letter (Signed)
Summary: NOCTURNAL POLYSOMNOGRAM  NOCTURNAL POLYSOMNOGRAM   Imported By: Lind Guest 01/14/2010 10:27:27  _____________________________________________________________________  External Attachment:    Type:   Image     Comment:   External Document

## 2010-04-08 NOTE — Assessment & Plan Note (Signed)
Summary: FLU   Vital Signs:  Patient profile:   50 year old female Height:      66 inches Weight:      230 pounds BMI:     37.26 O2 Sat:      96 % Temp:     98.7 degrees F oral Pulse rate:   77 / minute Pulse rhythm:   regular Resp:     16 per minute BP sitting:   110 / 88  (right arm)  Vitals Entered By: Everitt Amber LPN (May 01, 2009 10:34 AM)  Nutrition Counseling: Patient's BMI is greater than 25 and therefore counseled on weight management options. CC: Cough, keeping up all night, is able to bring up green phlegm, chills, hot and cold flashes. Just started yesetrday   CC:  Cough, keeping up all night, is able to bring up green phlegm, chills, and hot and cold flashes. Just started yesetrday.  History of Present Illness: 2 day h/o acute head and chest congestion , cough , body achesfeverabnd chills. Positive sick contact. Prior to this she was well.  Current Medications (verified): 1)  Amlodipine Besylate 10 Mg Tabs (Amlodipine Besylate) .... Take 1 Tablet By Mouth Once A Day 2)  Zolpidem Tartrate 10 Mg Tabs (Zolpidem Tartrate) .... Take 1 Tab By Mouth At Bedtime 3)  Nexium 40 Mg Cpdr (Esomeprazole Magnesium) .... Take 1 Tablet By Mouth Once A Day 4)  Tramadol Hcl 50 Mg Tabs (Tramadol Hcl) .Marland Kitchen.. 1 or 2 Every 6 Hrs As Needed 5)  Zoloft 50 Mg Tabs (Sertraline Hcl) .... Take 1 Tablet By Mouth Once A Day 6)  Alprazolam 1 Mg Tabs (Alprazolam) .... Take 1 Tablet By Mouth Three Times A Day  Allergies (verified): 1)  ! Codeine  Review of Systems      See HPI General:  Complains of chills, fatigue, fever, and malaise. Eyes:  Denies discharge and red eye. ENT:  Complains of nasal congestion, postnasal drainage, and sinus pressure; denies sore throat. CV:  Denies chest pain or discomfort and palpitations. Resp:  Complains of cough and sputum productive. GI:  Denies abdominal pain, constipation, diarrhea, nausea, and vomiting. GU:  Denies dysuria and urinary frequency. MS:   Complains of joint pain, muscle aches, and muscle weakness. Derm:  Denies itching, lesion(s), and rash. Neuro:  Complains of headaches; denies seizures and sensation of room spinning. Psych:  Complains of anxiety and depression; denies suicidal thoughts/plans, thoughts of violence, and unusual visions or sounds. Endo:  Denies cold intolerance, excessive hunger, excessive thirst, excessive urination, heat intolerance, polyuria, and weight change. Heme:  Denies abnormal bruising and bleeding. Allergy:  Denies hives or rash and sneezing.  Physical Exam  General:  Well-developed,obese,in no acute distress; alert,appropriate and cooperative throughout examination. ill appearing. HEENT: No facial asymmetry,  EOMI, posive frontal sinus tenderness, TM's Clear, oropharynx  pink and moist.   Chest: decreased air entry, scattered crackles no wheezes CVS: S1, S2, No murmurs, No S3.   Abd: Soft, Nontender.  MS: Adequate ROM spine, hips, shoulders and knees.  Ext: No edema.   CNS: CN 2-12 intact, power tone and sensation normal throughout.   Skin: Intact, no visible lesions or rashes.  Psych: Good eye contact, normal affect.  Memory intact, not anxious or depressed appearing.      Impression & Recommendations:  Problem # 1:  ACUTE SINUSITIS, UNSPECIFIED (ICD-461.9) Assessment Comment Only  Her updated medication list for this problem includes:    Septra Ds 800-160 Mg Tabs (  Sulfamethoxazole-trimethoprim) .Marland Kitchen... Take 1 tablet by mouth two times a day    Tessalon Perles 100 Mg Caps (Benzonatate) .Marland Kitchen... Take 1 capsule by mouth three times a day rocephin 500mg  Im administered  Orders: Rocephin  250mg  (W4315) Admin of Therapeutic Inj  intramuscular or subcutaneous (40086)  Problem # 2:  ACUTE BRONCHITIS (ICD-466.0) Assessment: Comment Only  Her updated medication list for this problem includes:    Septra Ds 800-160 Mg Tabs (Sulfamethoxazole-trimethoprim) .Marland Kitchen... Take 1 tablet by mouth two times a  day    Tessalon Perles 100 Mg Caps (Benzonatate) .Marland Kitchen... Take 1 capsule by mouth three times a day Rocephin 500mg  im administered  Orders: Depo- Medrol 80mg  (J1040)  Problem # 3:  HEADACHE (ICD-784.0) Assessment: Comment Only  Her updated medication list for this problem includes:    Tramadol Hcl 50 Mg Tabs (Tramadol hcl) .Marland Kitchen... 1 or 2 every 6 hrs as needed Toradol 60 mg im and depomedrol 80mg  iM administered  Orders: Ketorolac-Toradol 15mg  (P6195)  Problem # 4:  OBESITY (ICD-278.00) Assessment: Unchanged  Ht: 66 (05/01/2009)   Wt: 230 (05/01/2009)   BMI: 37.26 (05/01/2009)  Problem # 5:  UNSPECIFIED ESSENTIAL HYPERTENSION (ICD-401.9) Assessment: Improved  Her updated medication list for this problem includes:    Amlodipine Besylate 10 Mg Tabs (Amlodipine besylate) .Marland Kitchen... Take 1 tablet by mouth once a day  BP today: 110/88 Prior BP: 120/90 (04/02/2009)  Complete Medication List: 1)  Amlodipine Besylate 10 Mg Tabs (Amlodipine besylate) .... Take 1 tablet by mouth once a day 2)  Zolpidem Tartrate 10 Mg Tabs (Zolpidem tartrate) .... Take 1 tab by mouth at bedtime 3)  Nexium 40 Mg Cpdr (Esomeprazole magnesium) .... Take 1 tablet by mouth once a day 4)  Tramadol Hcl 50 Mg Tabs (Tramadol hcl) .Marland Kitchen.. 1 or 2 every 6 hrs as needed 5)  Zoloft 50 Mg Tabs (Sertraline hcl) .... Take 1 tablet by mouth once a day 6)  Alprazolam 1 Mg Tabs (Alprazolam) .... Take 1 tablet by mouth three times a day 7)  Septra Ds 800-160 Mg Tabs (Sulfamethoxazole-trimethoprim) .... Take 1 tablet by mouth two times a day 8)  Tessalon Perles 100 Mg Caps (Benzonatate) .... Take 1 capsule by mouth three times a day 9)  Fluconazole 150 Mg Tabs (Fluconazole) .... Take 1 tablet by mouth once a day as needed  Patient Instructions: 1)  f/u as before. 2)  you are being treated for acute sinusitis and bronchitis. you will get injections in the office and meds are also sent to your pharmacy 3)  Get plenty of rest, drink lots  of clear liquids, and use Tylenol or Ibuprofen for fever and comfort. calln in 7-10 days if you're not better:sooner if you're feeling worse. 4)  you will get injections today in the office and meds are also sent to your pharmacy.  Prescriptions: SEPTRA DS 800-160 MG TABS (SULFAMETHOXAZOLE-TRIMETHOPRIM) Take 1 tablet by mouth two times a day  #20 x 0   Entered by:   Adella Hare LPN   Authorized by:   Syliva Overman MD   Signed by:   Adella Hare LPN on 09/32/6712   Method used:   Electronically to        [facility]* (retail)       [address]       [place], [place]  [address]       Ph: [phone] or [phone]       Fax:  [phone]   RxID:   [id number] TESSALON PERLES 100 MG CAPS (BENZONATATE) Take 1 capsule by mouth three times a day  #30 x 0   Entered by:   Adella Hare LPN   Authorized by:   Syliva Overman MD   Signed by:   Adella Hare LPN on 30/86/5784   Method used:   Electronically to        [facility]* (retail)       [address]       [place], [place]  [address]       Ph: [phone] or [phone]       Fax: [phone]   RxID:   [id number] FLUCONAZOLE 150 MG TABS (FLUCONAZOLE) Take 1 tablet by mouth once a day as needed  #2 x 0   Entered by:   Adella Hare LPN   Authorized by:   Syliva Overman MD   Signed by:   Adella Hare LPN on 16/60/6301   Method used:   Electronically to        [facility]* (retail)       [address]       [place], [place]  [address]       Ph: [phone] or [phone]       Fax: [phone]   RxID:   [id number] FLUCONAZOLE 150 MG TABS (FLUCONAZOLE) Take 1 tablet by mouth once a day as needed  #2 x 0   Entered and Authorized by:   Syliva Overman MD   Signed by:   Syliva Overman MD on 05/01/2009   Method used:   Electronically to        [facility] (retail)       [address]       [place], [place]  [address]       Ph: [phone]       Fax: [phone]   RxID:   [id number] TESSALON PERLES 100 MG CAPS (BENZONATATE) Take 1 capsule by mouth three times a day  #30 x 0   Entered and Authorized by:   Syliva Overman MD   Signed by:   Syliva Overman MD on 05/01/2009   Method used:   Electronically to        [facility] (retail)       [address]       [place], [place]  [address]       Ph: [phone]       Fax: [phone]   RxID:   [id number] SEPTRA DS 800-160 MG TABS (SULFAMETHOXAZOLE-TRIMETHOPRIM) Take 1 tablet by mouth two times a day  #20 x 0   Entered and Authorized by:   Syliva Overman MD   Signed by:   Syliva Overman MD on 05/01/2009   Method used:   Electronically to        [facility] (retail)       [address]       [place], [place]  [address]       Ph: [phone]       Fax: [phone]   RxID:   [id number]   Prevention & Chronic Care Immunizations   Influenza vaccine: Not documented    Tetanus booster: Not documented  Pneumococcal vaccine: Not documented  Colorectal Screening   Hemoccult: Not documented    Colonoscopy: Not documented  Other Screening   Pap smear: Not documented    Mammogram: ASSESSMENT: Negative - BI-RADS 1^MM DIGITAL SCREENING  (12/17/2008)   Smoking status: Not documented  Lipids   Total Cholesterol: Not documented   LDL: Not documented   LDL Direct: Not documented   HDL: Not documented   Triglycerides: Not documented  Hypertension   Last Blood Pressure: 110 / 88  (05/01/2009)   Serum creatinine: Not documented   Serum potassium Not documented  Self-Management Support :    Hypertension self-management support: Not documented   Medication Administration  Injection # 1:    Medication: Depo- Medrol 80mg     Diagnosis: ACUTE BRONCHITIS (ICD-466.0)    Route: IM    Site: RUOQ gluteus    Exp Date:  01/2010    Lot #: [id number]    Mfr: Pharmacia    Comments: 80mg  given     Patient tolerated injection without complications    Given by: Everitt Amber LPN (May 01, 2009 12:05 PM)  Injection # 2:    Medication: Ketorolac-Toradol 15mg     Diagnosis: HEADACHE (ICD-784.0)    Route: IM    Site: RUOQ gluteus    Exp Date: 10/2010    Lot #: [id number]    Mfr: [email or web address]    Comments: 60mg  given     Patient tolerated injection without complications    Given by: Everitt Amber LPN (May 01, 2009 12:05 PM)  Injection # 3:    Medication: Rocephin  250mg     Diagnosis: ACUTE SINUSITIS, UNSPECIFIED (ICD-461.9)    Route: IM    Site: RUOQ gluteus    Exp Date: 06/2011    Lot #: [id number]    Mfr: [email or web address]    Comments: 500mg  given     Patient tolerated injection without complications    Given by: Everitt Amber LPN (May 01, 2009 12:06 PM)  Orders Added: 1)  Est. Patient Level IV [04540] 2)  Depo- Medrol 80mg  [J1040] 3)  Ketorolac-Toradol 15mg  [J1885] 4)  Rocephin  250mg  [J0696] 5)  Admin of Therapeutic Inj  intramuscular or subcutaneous [98119]

## 2010-04-08 NOTE — Assessment & Plan Note (Signed)
Summary: new patient   Vital Signs:  Patient profile:   50 year old female Height:      66 inches Weight:      230.25 pounds BMI:     37.30 O2 Sat:      96 % Pulse rate:   76 / minute Pulse rhythm:   regular Resp:     16 per minute BP sitting:   120 / 90  (right arm)  Vitals Entered By: Everitt Amber (April 02, 2009 2:04 PM)  Nutrition Counseling: Patient's BMI is greater than 25 and therefore counseled on weight management options. CC: New Patient, mom is in nursing home and she has alot on her and she is really stressed out, began crying when going over family history   CC:  New Patient, mom is in nursing home and she has alot on her and she is really stressed out, and began crying when going over family history.  History of Present Illness: This is a new pt evaluation , for this middle aged female wih problems of severe depression and isolation from her family. She reports beng stressed out , seemingly the only one responsible for her mother's routine f/u in the nursing home, since her other siblings work.She has a strained relationship with 1 son who was taken from herin her youth because of her wild behavior , she feels that from that time her family did not stand up for her, and recently at a fam gathering she states she was told in the open that a gentleman to whom she was engaged wa family so they could not marry, hi was in 2008. Since that time she has been to no family gatherings. She remains withdrawn and depressed, and in August wa suicidal, she has been in therapy since, which has helped.  she does not exercise and is aware of the need to do so as well as to change her diet to facilitate weight loss and improvedhealth.   Her cancer screening tests are reportedly up to date , and attempts will be made to obtain them.     Reports  that she is otherwise doing well. Denies recent fever or chills. Denies sinus pressure, nasal congestion , ear pain or sore throat. Denies  chest congestion, or cough productive of sputum. Denies chest pain, palpitations, PND, orthopnea or leg swelling. Denies abdominal pain, nausea, vomitting, diarrhea or constipation. Denies change in bowel movements or bloody stool. Denies dysuria , frequency, incontinence or hesitancy. Denies  joint pain, swelling, or reduced mobility. Denies headaches, vertigo, seizures.  Denies  rash, lesions, or itch.     Preventive Screening-Counseling & Management      Drug Use:  no.    Current Medications (verified): 1)  Amlodipine Besylate 10 Mg Tabs (Amlodipine Besylate) .... Take 1 Tablet By Mouth Once A Day 2)  Zolpidem Tartrate 10 Mg Tabs (Zolpidem Tartrate) .... Take 1 Tab By Mouth At Bedtime 3)  Nexium 40 Mg Cpdr (Esomeprazole Magnesium) .... Take 1 Tablet By Mouth Once A Day 4)  Tramadol Hcl 50 Mg Tabs (Tramadol Hcl) .Marland Kitchen.. 1 or 2 Every 6 Hrs As Needed 5)  Alprazolam 1 Mg Tabs (Alprazolam) .... Take 1 Tablet By Mouth Three Times A Day  Allergies (verified): No Known Drug Allergies  Past History:  Past Medical History: Hypertension x 1996 depression anxiety [facility]ERD [facility]  1998 scleroderma  Dr bland  1998 Disabled since  1998 OBESITY  Past Surgical History: Surgery (middle fingers) on both  hands, removed tips   [facility] Hyste[facility] x 20 years  cancer Caesarean section x 2  vAscular surgery   on both hands   [facility]  Family History: Mother-Living-Rheumatoid Arthritis, HTN, dementia agwe 72 in rest home for 20 months Father-deceased- at age 53 Cirrosis of the liver, alcoholic Brother-Deceased-lymph node cancer  died at age 28, another brother died at age 27, congenital heart ds 2 brothers-living-healthy 3 sisters-healthy  Social History: Occupation: disability since 1997, was employedd in [employer] Single Quit smoking in 4 years Alcohol use- was an alcoholic quit in 1610 Drug use  was on pot quit 1994 Drug Use:  no  Review of Systems      See  HPI Eyes:  Denies blurring, discharge, and red eye. Psych:  Complains of anxiety, depression, easily angered, easily tearful, irritability, mental problems, suicidal thoughts/plans, and unusual visions or sounds; depression  felt suicidal as recently as August, has been at [facility] since August, hears voices when no-one is around, s. Heme:  Denies abnormal bruising and bleeding. Allergy:  Denies hives or rash and itching eyes.  Physical Exam  General:  Well-developed,obese,in no acute distress; alert,appropriate and cooperative throughout examination. Tearful at times HEENT: No facial asymmetry,  EOMI, No sinus tenderness, TM's Clear, oropharynx  pink and moist.   Chest: Clear to auscultation bilaterally.  CVS: S1, S2, No murmurs, No S3.   Abd: Soft, Nontender.  MS: Adequate ROM spine, hips, shoulders and knees.  Ext: No edema.   CNS: CN 2-12 intact, power tone and sensation normal throughout.   Skin: Intact, no visible lesions or rashes.  Psych: Good eye contact, normal affect.  Memory intact, not anxious or depressed appearing.    Impression & Recommendations:  Problem # 1:  DEPRESSION (ICD-311) Assessment Deteriorated  Her updated medication list for this problem includes:    Zoloft 50 Mg Tabs (Sertraline hcl) .Marland Kitchen... Take 1 tablet by mouth once a day    Alprazolam 1 Mg Tabs (Alprazolam) .Marland Kitchen... Take 1 tablet by mouth three times a day Pt recently started therapy , which she has found to be helpful, she is encouraged to continue this  Problem # 2:  OBESITY (ICD-278.00) Assessment: Comment Only  Ht: 66 (04/02/2009)   Wt: 230.25 (04/02/2009)   BMI: 37.30 (04/02/2009)  Problem # 3:  UNSPECIFIED ESSENTIAL HYPERTENSION (ICD-401.9) Assessment: Comment Only  Her updated medication list for this problem includes:    Amlodipine Besylate 10 Mg Tabs (Amlodipine besylate) .Marland Kitchen... Take 1 tablet by mouth once a day  BP today: 120/90, dASH diet and impt of exercise and weight loss  stressed  Complete Medication List: 1)  Amlodipine Besylate 10 Mg Tabs (Amlodipine besylate) .... Take 1 tablet by mouth once a day 2)  Zolpidem Tartrate 10 Mg Tabs (Zolpidem tartrate) .... Take 1 tab by mouth at bedtime 3)  Nexium 40 Mg Cpdr (Esomeprazole magnesium) .... Take 1 tablet by mouth once a day 4)  Tramadol Hcl 50 Mg Tabs (Tramadol hcl) .Marland Kitchen.. 1 or 2 every 6 hrs as needed 5)  Zoloft 50 Mg Tabs (Sertraline hcl) .... Take 1 tablet by mouth once a day 6)  Alprazolam 1 Mg Tabs (Alprazolam) .... Take 1 tablet by mouth three times a day  Patient Instructions: 1)  Please schedule a follow-up appointment in 2 months. 2)  It is important that you exercise regularly at least 20 minutes 5 times a week. If you develop chest pain, have severe difficulty breathing, or feel very tired ,  stop exercising immediately and seek medical attention. 3)  You need to lose weight. Consider a lower calorie diet and regular exercise.  4)  New med for depression, continuie all other meds Prescriptions: ZOLPIDEM TARTRATE 10 MG TABS (ZOLPIDEM TARTRATE) Take 1 tab by mouth at bedtime  #30 x 5   Entered by:   Worthy Keeler LPN   Authorized by:   Syliva Overman MD   Signed by:   Worthy Keeler LPN on 52/84/1324   Method used:   Printed then faxed to ...       [facility] [address] (retail)       [address]       [place], [place]  [address]       Ph: [phone]       Fax: [phone]   RxID:   [id number] ALPRAZOLAM 1 MG TABS (ALPRAZOLAM) Take 1 tablet by mouth three times a day  #90 x 3   Entered and Authorized by:   Syliva Overman MD   Signed by:   Syliva Overman MD on 04/02/2009   Method used:   Printed then faxed to ...       [facility]  [address] (retail)       [address]       [place], [place]  [address]       Ph: [phone]       Fax: [phone]   RxID:   [id number] ZOLOFT 50 MG TABS (SERTRALINE HCL) Take 1 tablet by mouth once a  day  #30 x 3   Entered and Authorized by:   Syliva Overman MD   Signed by:   Syliva Overman MD on 04/02/2009   Method used:   Printed then faxed to ...         RxID:   [id number]

## 2010-04-08 NOTE — Letter (Signed)
Summary: family tree  family tree   Imported By: Lind Guest 01/27/2010 13:08:19  _____________________________________________________________________  External Attachment:    Type:   Image     Comment:   External Document

## 2010-04-08 NOTE — Progress Notes (Signed)
Summary: hawkins  hawkins   Imported By: Lind Guest 09/04/2009 14:34:19  _____________________________________________________________________  External Attachment:    Type:   Image     Comment:   External Document

## 2010-04-08 NOTE — Assessment & Plan Note (Signed)
Summary: office visit   Vital Signs:  Patient profile:   50 year old female Height:      66 inches Weight:      231.25 pounds BMI:     37.46 Pulse rate:   72 / minute Pulse rhythm:   regular Resp:     16 per minute BP sitting:   114 / 74  (left arm) Cuff size:   large  Vitals Entered By: Everitt Amber LPN (August 20, 2009 2:16 PM)  Nutrition Counseling: Patient's BMI is greater than 25 and therefore counseled on weight management options. CC: Follow up chronic problems, still is hoarse but feels alot better   CC:  Follow up chronic problems and still is hoarse but feels alot better.  History of Present Illness: 3 WEEK H/O UNCONTROLLED HACKING COUGH LIKE A BARK. This is her 3rd ov in the past 3 weeks. She has had no vomitting , but has nausea associated with the cough. Never had fever or chills.. She has been around someone with a similar cough at school. She is also experiencing head congestion with sinus pressure, waking up between 2 and 5 am coughing, has to put pillows behind her back using 5 pillows.. She has had doxycycline and hydrocodone tabs , no relief.  Current Medications (verified): 1)  Amlodipine Besylate 10 Mg Tabs (Amlodipine Besylate) .... Take 1 Tablet By Mouth Once A Day 2)  Zolpidem Tartrate 10 Mg Tabs (Zolpidem Tartrate) .... Take 1 Tab By Mouth At Bedtime 3)  Nexium 40 Mg Cpdr (Esomeprazole Magnesium) .... Take 1 Tablet By Mouth Once A Day 4)  Tramadol Hcl 50 Mg Tabs (Tramadol Hcl) .Marland Kitchen.. 1 or 2 Every 6 Hrs As Needed 5)  Zoloft 50 Mg Tabs (Sertraline Hcl) .... Take 1 Tablet By Mouth Once A Day 6)  Alprazolam 1 Mg Tabs (Alprazolam) .... Take 1 Tablet By Mouth Three Times A Day 7)  Doxycycline Hyclate 100 Mg Caps (Doxycycline Hyclate) .... Take 1 Two Times A Day For 10 Days 8)  Vicodin 5-500 Mg Tabs (Hydrocodone-Acetaminophen) .... Take 1 Every 6 Hrs As Needed For Cough  Allergies (verified): 1)  ! Codeine  Review of Systems      See HPI General:  Complains of  fatigue; denies chills and fever. Eyes:  Denies blurring, discharge, and red eye. ENT:  Complains of hoarseness and postnasal drainage; denies sore throat. CV:  Complains of shortness of breath with exertion; denies chest pain or discomfort, difficulty breathing while lying down, palpitations, and swelling of feet. Resp:  Complains of cough and shortness of breath; denies sputum productive. GI:  Complains of nausea; chronic bloating and belching x months and nausea. GU:  Denies dysuria and urinary frequency. MS:  Denies joint pain and stiffness. Psych:  Complains of anxiety and depression; denies mental problems, suicidal thoughts/plans, thoughts of violence, and unusual visions or sounds; improved. Endo:  Denies cold intolerance, excessive hunger, excessive thirst, excessive urination, heat intolerance, polyuria, and weight change. Heme:  Denies abnormal bruising and bleeding. Allergy:  Complains of seasonal allergies and sneezing; uncontrolled.  Physical Exam  General:  Well-developed,well-nourished,in no acute distress; alert,appropriate and cooperative throughout examination HEENT: No facial asymmetry,  EOMI, No sinus tenderness, TM's Clear, oropharynx  pink and moist.   Chest: decreased air entry , scattered crackles, , no wheezes CVS: S1, S2, No murmurs, No S3.   Abd: Soft, mild epigastric tenderness MS: Adequate ROM spine, hips, shoulders and knees.  Ext: No edema.   CNS: CN  2-12 intact, power tone and sensation normal throughout.   Skin: Intact, no visible lesions or rashes.  Psych: Good eye contact, normal affect.  Memory intact, not anxious or depressed appearing.    Impression & Recommendations:  Problem # 1:  FATIGUE (ICD-780.79) Assessment Deteriorated  Orders: T-CBC w/Diff (04540-98119)  Problem # 2:  DYSPEPSIA, CHRONIC (ICD-536.8) Assessment: Comment Only GBUS and hida if negative  Problem # 3:  COUGH (ICD-786.2) Assessment: Unchanged  Orders: CXR- 2view  (CXR) Depo- Medrol 80mg  (J1040)  Problem # 4:  DEPRESSION (ICD-311) Assessment: Improved  Her updated medication list for this problem includes:    Zoloft 50 Mg Tabs (Sertraline hcl) .Marland Kitchen... Take 1 tablet by mouth once a day    Alprazolam 1 Mg Tabs (Alprazolam) .Marland Kitchen... Take 1 tablet by mouth three times a day  Problem # 5:  HEADACHE (ICD-784.0) Assessment: Unchanged  Her updated medication list for this problem includes:    Tramadol Hcl 50 Mg Tabs (Tramadol hcl) .Marland Kitchen... 1 or 2 every 6 hrs as needed aggravated by excessive coughing  Problem # 6:  UNSPECIFIED ESSENTIAL HYPERTENSION (ICD-401.9) Assessment: Unchanged  Her updated medication list for this problem includes:    Amlodipine Besylate 10 Mg Tabs (Amlodipine besylate) .Marland Kitchen... Take 1 tablet by mouth once a day  BP today: 114/74 Prior BP: 114/84 (08/14/2009)  Problem # 7:  NAUSEA (ICD-787.02) Assessment: Deteriorated  Orders: Zofran 1mg . injection (J4782) Admin of Therapeutic Inj  intramuscular or subcutaneous (96372)Future Orders: Radiology Referral (Radiology) ... 08/21/2009  Complete Medication List: 1)  Amlodipine Besylate 10 Mg Tabs (Amlodipine besylate) .... Take 1 tablet by mouth once a day 2)  Zolpidem Tartrate 10 Mg Tabs (Zolpidem tartrate) .... Take 1 tab by mouth at bedtime 3)  Nexium 40 Mg Cpdr (Esomeprazole magnesium) .... Take 1 tablet by mouth once a day 4)  Tramadol Hcl 50 Mg Tabs (Tramadol hcl) .Marland Kitchen.. 1 or 2 every 6 hrs as needed 5)  Zoloft 50 Mg Tabs (Sertraline hcl) .... Take 1 tablet by mouth once a day 6)  Alprazolam 1 Mg Tabs (Alprazolam) .... Take 1 tablet by mouth three times a day 7)  Clarithromycin 500 Mg Tabs (Clarithromycin) .... Take 1 tablet by mouth two times a day 8)  Prednisone (pak) 5 Mg Tabs (Prednisone) .... Use as directed 9)  Fluconazole 150 Mg Tabs (Fluconazole) .... Take 1 tablet by mouth once a day as needed  Patient Instructions: 1)  Please schedule a follow-up appointment in 3  weeks. 2)  PLS STOP DOXY, AND START THE NEW ANTIBIOTIC FOR YOUR COUGH AS WELL AS A PREDNISONE DOSE PACK. iF YOUR SYMPTOMS ARE NO BETTER, THEN YOU WILL need to see a lung specialist, pls call back next week if no better. 3)  CXR today, also CBC today 4)  Injections today for nausea and cough Prescriptions: FLUCONAZOLE 150 MG TABS (FLUCONAZOLE) Take 1 tablet by mouth once a day as needed  #3 x 0   Entered and Authorized by:   Syliva Overman MD   Signed by:   Syliva Overman MD on 08/20/2009   Method used:   Electronically to        [facility] (retail)       [address]       [place], [place]  [address]       Ph: [phone]       Fax: [phone]   RxID:   [id number] PREDNISONE (PAK)  5 MG TABS (PREDNISONE) Use as directed  #21 x 0   Entered and Authorized by:   Syliva Overman MD   Signed by:   Syliva Overman MD on 08/20/2009   Method used:   Electronically to        [facility] (retail)       [address]       [place], [place]  [address]       Ph: [phone]       Fax: [phone]   RxID:   [id number] CLARITHROMYCIN 500 MG TABS (CLARITHROMYCIN) Take 1 tablet by mouth two times a day  #20 x 0   Entered and Authorized by:   Syliva Overman MD   Signed by:   Syliva Overman MD on 08/20/2009   Method used:   Electronically to        [facility] [address] (retail)       [address]       [place], [place]  [address]       Ph: [phone]       Fax: [phone]   RxID:   [id number]  meds telephoned to [facility] pharmacy  Medication Administration  Injection # 1:    Medication: Depo- Medrol 80mg     Diagnosis: COUGH (ICD-786.2)    Route: IM    Site: RUOQ gluteus    Exp Date: 04/2010    Lot #: [id number]    Mfr: Pharmacia    Comments: 80mg  given     Patient tolerated injection without complications    Given by: Everitt Amber LPN (August 20, 2009 3:41  PM)  Injection # 2:    Medication: Zofran 1mg . injection    Diagnosis: NAUSEA (ICD-787.02)    Route: IM    Site: LUOQ gluteus    Exp Date: 12/2010    Lot #: [id number]    Mfr: [email or web address]    Comments: 4mg  given     Patient tolerated injection without complications    Given by: Everitt Amber LPN (August 20, 2009 3:42 PM)  Orders Added: 1)  CXR- 2view [CXR] 2)  Est. Patient Level IV [32355] 3)  T-CBC w/Diff [73220-25427] 4)  Depo- Medrol 80mg  [J1040] 5)  Zofran 1mg . injection [J2405] 6)  Admin of Therapeutic Inj  intramuscular or subcutaneous [96372] 7)  Radiology Referral [Radiology]   Prevention & Chronic Care Immunizations   Influenza vaccine: Not documented    Tetanus booster: Not documented    Pneumococcal vaccine: Not documented  Colorectal Screening   Hemoccult: Not documented    Colonoscopy: Not documented  Other Screening   Pap smear: Not documented    Mammogram: ASSESSMENT: Negative - BI-RADS 1^MM DIGITAL SCREENING  (12/17/2008)   Smoking status: Not documented  Lipids   Total Cholesterol: Not documented   LDL: Not documented   LDL Direct: Not documented   HDL: Not documented   Triglycerides: Not documented  Hypertension   Last Blood Pressure: 114 / 74  (08/20/2009)   Serum creatinine: Not documented   Serum potassium Not documented  Self-Management Support :    Hypertension self-management support: Not documented

## 2010-04-08 NOTE — Assessment & Plan Note (Signed)
Summary: F UP   Vital Signs:  Patient profile:   50 year old female Menstrual status:  hysterectomy Height:      66 inches Weight:      232 pounds BMI:     37.58 O2 Sat:      94 % Pulse rate:   80 / minute Pulse rhythm:   regular Resp:     16 per minute BP sitting:   110 / 82  (right arm)  Vitals Entered By: Everitt Amber LPN  Nutrition Counseling: Patient's BMI is greater than 25 and therefore counseled on weight management options. CC: Follow up chronic problems   CC:  Follow up chronic problems.  History of Present Illness: c/o 4 day h/o chest congestion and cough, she has had similar symptoms in the recent past. Reports  that  prior to this she had been doing well, and she is less depressed , now she states she is  love.  Denies sinus pressure, nasal congestion , ear pain or sore throat.  Denies chest pain, palpitations, PND, orthopnea or leg swelling. Denies abdominal pain, nausea, vomitting, diarrhea or constipation. Denies change in bowel movements or bloody stool. Denies dysuria , frequency, incontinence or hesitancy. Denies  joint pain, swelling, or reduced mobility. Denies headaches, vertigo, seizures. Denies depression, anxiety or insomnia. Denies  rash, lesions, or itch.     Current Medications (verified): 1)  Amlodipine Besylate 10 Mg Tabs (Amlodipine Besylate) .... Take 1 Tablet By Mouth Once A Day 2)  Zolpidem Tartrate 10 Mg Tabs (Zolpidem Tartrate) .... Take 1 Tab By Mouth At Bedtime 3)  Nexium 40 Mg Cpdr (Esomeprazole Magnesium) .... Take 1 Tablet By Mouth Once A Day 4)  Tramadol Hcl 50 Mg Tabs (Tramadol Hcl) .Marland Kitchen.. 1 or 2 Every 6 Hrs As Needed 5)  Zoloft 50 Mg Tabs (Sertraline Hcl) .... Take 1 Tablet By Mouth Once A Day 6)  Alprazolam 1 Mg Tabs (Alprazolam) .... Take 1 Tablet By Mouth Three Times A Day 7)  Ventolin Hfa 108 (90 Base) Mcg/act Aers (Albuterol Sulfate) .... Use 2 Puffs Every 4 Hours As Needed 8)  Hydrocodone-Acetaminophen 5-500 Mg Tabs  (Hydrocodone-Acetaminophen) .... Take 1 Every 6 Hrs As Needed For Cough and Rib Pain  Allergies (verified): 1)  ! Codeine  Review of Systems      See HPI General:  Complains of fatigue and sleep disorder. Resp:  Complains of cough and sputum productive; 4 day h/o deep cough which is recurrent, no fevr or chills, has used robitussin with some relief. Endo:  Denies excessive thirst and excessive urination. Heme:  Denies abnormal bruising and bleeding. Allergy:  Complains of seasonal allergies.  Physical Exam  General:  Well-developed,overweight,in no acute distress; alert,appropriate and cooperative throughout examination HEENT: No facial asymmetry,  EOMI, No sinus tenderness, TM's Clear, oropharynx  pink and moist. Mild erythema and edema of nasal mucosa  Chest: Clear to auscultation bilaterally. decreased air entry, bilateral crackles and wheezes CVS: S1, S2, No murmurs, No S3.   Abd: Soft, Nontender.  MS: Adequate ROM spine, hips, shoulders and knees.  Ext: No edema.   CNS: CN 2-12 intact, power tone and sensation normal throughout.   Skin: Intact, no visible lesions or rashes.  Psych: Good eye contact, normal affect.  Memory intact, not anxious or depressed appearing.    Impression & Recommendations:  Problem # 1:  ACUTE BRONCHITIS (ICD-466.0) Assessment Comment Only  The following medications were removed from the medication list:    Clarithromycin  500 Mg Tabs (Clarithromycin) .Marland Kitchen... Take 1 tablet by mouth two times a day Her updated medication list for this problem includes:    Ventolin Hfa 108 (90 Base) Mcg/act Aers (Albuterol sulfate) ..... Use 2 puffs every 4 hours as needed    Metronidazole 500 Mg Tabs (Metronidazole) .Marland Kitchen... Take 1 tablet by mouth two times a day    Tessalon Perles 100 Mg Caps (Benzonatate) .Marland Kitchen... Take 1 capsule by mouth three times a day  Orders: Depo- Medrol 80mg  (J1040) Rocephin  250mg  (E4540) Admin of Therapeutic Inj  intramuscular or subcutaneous  (98119) Albuterol Sulfate Sol 1mg  unit dose (J4782) Ipratropium inhalation sol. unit dose (N5621) Nebulizer Tx (30865)  Problem # 2:  OBESITY (ICD-278.00) Assessment: Unchanged  Ht: 66 (11/12/2009)   Wt: 232 (11/12/2009)   BMI: 37.58 (11/12/2009) therapeutic lifestyle change discussed and encouraged  Problem # 3:  DEPRESSION (ICD-311) Assessment: Improved  Her updated medication list for this problem includes:    Zoloft 50 Mg Tabs (Sertraline hcl) .Marland Kitchen... Take 1 tablet by mouth once a day    Alprazolam 1 Mg Tabs (Alprazolam) .Marland Kitchen... Take 1 tablet by mouth three times a day pt treated y psych  Complete Medication List: 1)  Amlodipine Besylate 10 Mg Tabs (Amlodipine besylate) .... Take 1 tablet by mouth once a day 2)  Zolpidem Tartrate 10 Mg Tabs (Zolpidem tartrate) .... Take 1 tab by mouth at bedtime 3)  Nexium 40 Mg Cpdr (Esomeprazole magnesium) .... Take 1 tablet by mouth once a day 4)  Tramadol Hcl 50 Mg Tabs (Tramadol hcl) .Marland Kitchen.. 1 or 2 every 6 hrs as needed 5)  Zoloft 50 Mg Tabs (Sertraline hcl) .... Take 1 tablet by mouth once a day 6)  Alprazolam 1 Mg Tabs (Alprazolam) .... Take 1 tablet by mouth three times a day 7)  Ventolin Hfa 108 (90 Base) Mcg/act Aers (Albuterol sulfate) .... Use 2 puffs every 4 hours as needed 8)  Hydrocodone-acetaminophen 5-500 Mg Tabs (Hydrocodone-acetaminophen) .... Take 1 every 6 hrs as needed for cough and rib pain 9)  Metronidazole 500 Mg Tabs (Metronidazole) .... Take 1 tablet by mouth two times a day 10)  Fluconazole 150 Mg Tabs (Fluconazole) .... Take 1 tablet by mouth once a day as needed 11)  Tessalon Perles 100 Mg Caps (Benzonatate) .... Take 1 capsule by mouth three times a day  Other Orders: Sleep Disorder Referral (Sleep Disorder)  Patient Instructions: 1)  Please schedule a follow-up appointment in 4 months. 2)  It is important that you exercise regularly at least 20 minutes 5 times a week. If you develop chest pain, have severe difficulty  breathing, or feel very tired , stop exercising immediately and seek medical attention. 3)  You need to lose weight. Consider a lower calorie diet and regular exercise.  4)  You are being treated for bronchiitis Prescriptions: ALPRAZOLAM 1 MG TABS (ALPRAZOLAM) Take 1 tablet by mouth three times a day  #90 x 3   Entered by:   Adella Hare LPN   Authorized by:   Syliva Overman MD   Signed by:   Adella Hare LPN on 78/46/9629   Method used:   Printed then faxed to ...        Pharmacy* (retail)       [address]       [place], [place]  [address]       Ph: [phone] or [phone]  Fax: [phone]   RxID:   [id number] NEXIUM 40 MG CPDR (ESOMEPRAZOLE MAGNESIUM) Take 1 tablet by mouth once a day  #30 x 3   Entered by:   Adella Hare LPN   Authorized by:   Syliva Overman MD   Signed by:   Adella Hare LPN on 66/44/0347   Method used:   Electronically to        [facility]* (retail)       [address]       [place], [place]  [address]       Ph: [phone] or [phone]       Fax: [phone]   RxID:   [id number] AMLODIPINE BESYLATE 10 MG TABS (AMLODIPINE BESYLATE) Take 1 tablet by mouth once a day  #30 x 3   Entered by:   Adella Hare LPN   Authorized by:   Syliva Overman MD   Signed by:   Adella Hare LPN on 23/76/2831   Method used:   Electronically to        [facility]* (retail)       [address]       [place], [place]  [address]       Ph: [phone] or [phone]       Fax: [phone]   RxID:   [id number] PREDNISONE (PAK) 5 MG TABS (PREDNISONE) Use as directed  #21 x 0   Entered and Authorized by:   Syliva Overman MD   Signed by:   Syliva Overman MD on 11/12/2009   Method used:   Electronically to        [facility]* (retail)       [address]       [place], [place]  [address]       Ph: [phone] or  [phone]       Fax: [phone]   RxID:   [id number] TESSALON PERLES 100 MG CAPS (BENZONATATE) Take 1 capsule by mouth three times a day  #30 x 0   Entered and Authorized by:   Syliva Overman MD   Signed by:   Syliva Overman MD on 11/12/2009   Method used:   Electronically to        [facility]* (retail)       [address]       [place], [place]  [address]       Ph: [phone] or [phone]       Fax: [phone]   RxID:   [id number] FLUCONAZOLE 150 MG TABS (FLUCONAZOLE) Take 1 tablet by mouth once a day as needed  #3 x 0   Entered and Authorized by:   Syliva Overman MD   Signed by:   Syliva Overman MD on 11/12/2009   Method used:   Electronically to        [facility]* (retail)       [address]       [place], [place]  [address]       Ph: [phone] or [phone]       Fax: [phone]   RxID:   [id number] METRONIDAZOLE 500 MG TABS (METRONIDAZOLE) Take 1 tablet by mouth two times a day  #20 x 0   Entered and Authorized by:   Syliva Overman MD  Signed by:   Syliva Overman MD on 11/12/2009   Method used:   Electronically to        [facility]* (retail)       [address]       [place], [place]  [address]       Ph: [phone] or [phone]       Fax: [phone]   RxID:   [id number]    Medication Administration  Injection # 1:    Medication: Depo- Medrol 80mg     Diagnosis: ACUTE BRONCHITIS (ICD-466.0)    Route: IM    Site: RUOQ gluteus    Exp Date: 6/12    Lot #: [id number]    Mfr: Pharmacia    Patient tolerated injection without complications    Given by: Adella Hare LPN (November 12, 2009 3:48 PM)  Injection # 2:    Medication: Rocephin  250mg     Diagnosis: ACUTE BRONCHITIS (ICD-466.0)    Route: IM    Site: LUOQ gluteus    Exp Date: 12/13    Lot #: [id number]    Mfr: novaplus    Comments: rocephin 500mg  given    Patient  tolerated injection without complications    Given by: Adella Hare LPN (November 12, 2009 3:49 PM)  Medication # 1:    Medication: Albuterol Sulfate Sol 1mg  unit dose    Diagnosis: ACUTE BRONCHITIS (ICD-466.0)    Dose: 2.5mg     Route: inhaled    Exp Date: 8/12    Lot #: [id number]    Mfr: nephron pharm    Patient tolerated medication without complications    Given by: Adella Hare LPN (November 12, 2009 3:50 PM)  Medication # 2:    Medication: Ipratropium inhalation sol. unit dose    Diagnosis: ACUTE BRONCHITIS (ICD-466.0)    Dose: 0.5mg     Route: inhaled    Exp Date: 10/12    Lot #: [id number]    Mfr: nephron pham    Patient tolerated medication without complications    Given by: Adella Hare LPN (November 12, 2009 3:51 PM)  Orders Added: 1)  Est. Patient Level IV [63875] 2)  Depo- Medrol 80mg  [J1040] 3)  Rocephin  250mg  [J0696] 4)  Admin of Therapeutic Inj  intramuscular or subcutaneous [96372] 5)  Albuterol Sulfate Sol 1mg  unit dose [J7613] 6)  Ipratropium inhalation sol. unit dose [J7644] 7)  Nebulizer Tx [94640] 8)  Sleep Disorder Referral [Sleep Disorder]

## 2010-04-08 NOTE — Progress Notes (Signed)
Summary: MEDS  Phone Note Call from Patient   Summary of Call: NEEDS HER NEXIUM SEND OVER TO [facility]. Initial call taken by: Lind Guest,  April 27, 2009 10:50 AM  Follow-up for Phone Call        meds sent Follow-up by: Adella Hare LPN,  April 27, 2009 11:08 AM    Prescriptions: NEXIUM 40 MG CPDR (ESOMEPRAZOLE MAGNESIUM) Take 1 tablet by mouth once a day  #30 x 4   Entered by:   Adella Hare LPN   Authorized by:   Syliva Overman MD   Signed by:   Adella Hare LPN on 16/12/9602   Method used:   Electronically to        [facility]* (retail)       [address]       [place], [place]  [address]       Ph: [phone] or [phone]       Fax: [phone]   RxID:   [id number]

## 2010-04-08 NOTE — Assessment & Plan Note (Signed)
Summary: office visit   Vital Signs:  Patient profile:   50 year old female Height:      66 inches Weight:      227 pounds BMI:     36.77 O2 Sat:      96 % Pulse rate:   63 / minute Pulse rhythm:   regular Resp:     16 per minute BP sitting:   110 / 80  (right arm)  Vitals Entered By: Everitt Amber LPN (June 04, 2009 2:15 PM)  Nutrition Counseling: Patient's BMI is greater than 25 and therefore counseled on weight management options. CC: Follow up chronic problems, lost 6 lbs!!  16  CC:  Follow up chronic problems and lost 6 lbs!!.  History of Present Illness: Reports  that she has been doing well. Denies recent fever or chills. Denies sinus pressure, nasal congestion , ear pain or sore throat. Denies chest congestion, or cough productive of sputum. Denies chest pain, palpitations, PND, orthopnea or leg swelling. Denies abdominal pain, nausea, vomitting, diarrhea or constipation. Denies change in bowel movements or bloody stool. Denies dysuria , frequency, incontinence or hesitancy. Denies  joint pain, swelling, or reduced mobility. Denies headaches, vertigo, seizures. Denies depression, anxiety or insomnia. Denies  rash, lesions, or itch. She has changed her eaating and exercise 4 days per week for approx 1 hr with a grup of friends and states that she really feels much better as a result.     Current Medications (verified): 1)  Amlodipine Besylate 10 Mg Tabs (Amlodipine Besylate) .... Take 1 Tablet By Mouth Once A Day 2)  Zolpidem Tartrate 10 Mg Tabs (Zolpidem Tartrate) .... Take 1 Tab By Mouth At Bedtime 3)  Nexium 40 Mg Cpdr (Esomeprazole Magnesium) .... Take 1 Tablet By Mouth Once A Day 4)  Tramadol Hcl 50 Mg Tabs (Tramadol Hcl) .Marland Kitchen.. 1 or 2 Every 6 Hrs As Needed 5)  Zoloft 50 Mg Tabs (Sertraline Hcl) .... Take 1 Tablet By Mouth Once A Day 6)  Alprazolam 1 Mg Tabs (Alprazolam) .... Take 1 Tablet By Mouth Three Times A Day  Allergies (verified): 1)  !  Codeine  Review of Systems      See HPI Eyes:  Denies blurring and discharge. Psych:  Complains of anxiety, depression, and mental problems; denies irritability, sense of great danger, suicidal thoughts/plans, thoughts of violence, and unusual visions or sounds; reports marked improvement in her symptoms . Endo:  Denies cold intolerance, excessive hunger, excessive thirst, excessive urination, heat intolerance, polyuria, and weight change. Heme:  Denies abnormal bruising and bleeding. Allergy:  Complains of seasonal allergies.  Physical Exam  General:  Well-developed,obese,in no acute distress; alert,appropriate and cooperative throughout examination HEENT: No facial asymmetry,  EOMI, No sinus tenderness, TM's Clear, oropharynx  pink and moist.   Chest: Clear to auscultation bilaterally.  CVS: S1, S2, No murmurs, No S3.   Abd: Soft, Nontender.  MS: Adequate ROM spine, hips, shoulders and knees.  Ext: No edema.   CNS: CN 2-12 intact, power tone and sensation normal throughout.   Skin: Intact, no visible lesions or rashes.  Psych: Good eye contact, normal affect.  Memory intact, not anxious or depressed appearing.    Impression & Recommendations:  Problem # 1:  OBESITY (ICD-278.00) Assessment Improved  Ht: 66 (06/04/2009)   Wt: 227 (06/04/2009)   BMI: 36.77 (06/04/2009)  Problem # 2:  DEPRESSION (ICD-311) Assessment: Improved  Her updated medication list for this problem includes:    Zoloft 50 Mg  Tabs (Sertraline hcl) .Marland Kitchen... Take 1 tablet by mouth once a day    Alprazolam 1 Mg Tabs (Alprazolam) .Marland Kitchen... Take 1 tablet by mouth three times a day  Problem # 3:  UNSPECIFIED ESSENTIAL HYPERTENSION (ICD-401.9) Assessment: Unchanged  Her updated medication list for this problem includes:    Amlodipine Besylate 10 Mg Tabs (Amlodipine besylate) .Marland Kitchen... Take 1 tablet by mouth once a day  BP today: 110/80 Prior BP: 100/70 (05/21/2009)  Complete Medication List: 1)  Amlodipine Besylate  10 Mg Tabs (Amlodipine besylate) .... Take 1 tablet by mouth once a day 2)  Zolpidem Tartrate 10 Mg Tabs (Zolpidem tartrate) .... Take 1 tab by mouth at bedtime 3)  Nexium 40 Mg Cpdr (Esomeprazole magnesium) .... Take 1 tablet by mouth once a day 4)  Tramadol Hcl 50 Mg Tabs (Tramadol hcl) .Marland Kitchen.. 1 or 2 every 6 hrs as needed 5)  Zoloft 50 Mg Tabs (Sertraline hcl) .... Take 1 tablet by mouth once a day 6)  Alprazolam 1 Mg Tabs (Alprazolam) .... Take 1 tablet by mouth three times a day  Patient Instructions: 1)  Please schedule a follow-up appointment in 2.5 months. 2)  pls start daily meds for allergies, either claritin or zyrtec. 3)  It is important that you exercise regularly at least 60 minutes 4 times a week. If you develop chest pain, have severe difficulty breathing, or feel very tired , stop exercising immediately and seek medical attention. 4)  You need to lose weight. Consider a lower calorie diet and regular exercise. Congr[place]ationson weight loss pLs keep itup, goal is 7 pounds

## 2010-04-08 NOTE — Progress Notes (Signed)
Summary: HER MEDICINES WHERE TO SEND THEM  Phone Note Call from Patient   Summary of Call: HER AMLODIPINE GOES TO [facility] ZOLPIDEM GOES TO [facility] TRAMADOL GOES TO [facility] NEXIUM GOES TO [facility]. THE ONLY 2 THAT GOES TO [facility] IS ALPRAZOLAM AND  ZOLOFT  SHE DOES NOT WANT THIS TROUBLE ANYMORE LOOKING FOR HER MEDICINE Initial call taken by: Lind Guest,  April 27, 2009 1:56 PM  Follow-up for Phone Call        noted and spoke with patient reguarding this matter, advised using multiple phamacies does create confusion, patient states she does this do to cost reasons, noted chart on what meds go where, advised the best thing to do is have appropriate pharmacy send refill requests Follow-up by: Adella Hare LPN,  April 27, 2009 2:06 PM

## 2010-04-08 NOTE — Progress Notes (Signed)
  Phone Note From Pharmacy   Caller: [facility]  [address] Summary of Call: REQUESTING REFILL ON FLUCONAZOLE 150, TESSALON PEARLES, AND SULFAMETH/TRIETH 800/160 Initial call taken by: Adella Hare LPN,  May 20, 2009 11:04 AM  Follow-up for Phone Call        Please check with pt.  If she is ill she needs an appt. Follow-up by: Esperanza Sheets PA,  May 20, 2009 11:35 AM  Additional Follow-up for Phone Call Additional follow up Details #1::        called patient, left message appt. today Additional Follow-up by: Adella Hare LPN,  May 20, 2009 11:58 AM    Additional Follow-up for Phone Call Additional follow up Details #2::    no fever, no chills, no body aches, states nasal drainage green but coughs up clear sputum, states cough is keeping her up at night   [phone] Follow-up by: Adella Hare LPN,  May 21, 2009 8:28 AM  Additional Follow-up for Phone Call Additional follow up Details #3:: Details for Additional Follow-up Action Taken: Pt needs to sched an appt. Additional Follow-up by: Esperanza Sheets PA,  May 21, 2009 10:08 AM

## 2010-04-08 NOTE — Progress Notes (Signed)
Summary: cough med  Phone Note Call from Patient   Summary of Call: pt is requesting to get some cough meds. [phone]  Initial call taken by: Rudene Anda,  November 10, 2009 11:57 AM  Follow-up for Phone Call        returned call, no answer Follow-up by: Adella Hare LPN,  November 10, 2009 1:41 PM  Additional Follow-up for Phone Call Additional follow up Details #1::        cough only, runny nose, clear mucous no other symptoms  wants cough med Additional Follow-up by: Adella Hare LPN,  November 10, 2009 2:36 PM    Additional Follow-up for Phone Call Additional follow up Details #2::    adviserobitussin DM directions as on bottle, if this does not work needs oV Follow-up by: Syliva Overman MD,  November 10, 2009 4:50 PM  Additional Follow-up for Phone Call Additional follow up Details #3:: Details for Additional Follow-up Action Taken: called pt and left message for her to try robitussin dm and if didn't work call us back and we would schedule appt.  Additional Follow-up by: Rudene Anda,  November 11, 2009 8:35 AM

## 2010-04-08 NOTE — Assessment & Plan Note (Signed)
Summary: office visit   Vital Signs:  Patient profile:   50 year old female Menstrual status:  hysterectomy Height:      66 inches Weight:      240.25 pounds BMI:     38.92 O2 Sat:      94 % Pulse rate:   85 / minute Pulse rhythm:   regular Resp:     16 per minute BP sitting:   120 / 82  (left arm)  Vitals Entered By: Everitt Amber LPN (March 16, 2010 2:30 PM)  Nutrition Counseling: Patient's BMI is greater than 25 and therefore counseled on weight management options. CC: Follow up chronic problems   CC:  Follow up chronic problems.  History of Present Illness: Reports  thatshe has been doing well except for continued weight gain, she wants help. Denies recent fever or chills. Denies sinus pressure, nasal congestion , ear pain or sore throat. Denies chest congestion, or cough productive of sputum. Denies chest pain, palpitations, PND, orthopnea or leg swelling. Denies abdominal pain, nausea, vomitting, diarrhea or constipation. Denies change in bowel movements or bloody stool. Denies dysuria , frequency, incontinence or hesitancy. Denies  joint pain, swelling, or reduced mobility. Denies headaches, vertigo, seizures. Denies depression, anxiety or insomnia. Denies  rash, lesions, or itch.     Current Medications (verified): 1)  Amlodipine Besylate 10 Mg Tabs (Amlodipine Besylate) .... Take 1 Tablet By Mouth Once A Day 2)  Nexium 40 Mg Cpdr (Esomeprazole Magnesium) .... Take 1 Tablet By Mouth Once A Day 3)  Tramadol Hcl 50 Mg Tabs (Tramadol Hcl) .Marland Kitchen.. 1 or 2 Every 6 Hrs As Needed 4)  Alprazolam 1 Mg Tabs (Alprazolam) .... Take 1 Tablet By Mouth Three Times A Day  Allergies (verified): 1)  ! Codeine  Review of Systems      See HPI General:  Complains of fatigue. Eyes:  Denies blurring, discharge, eye pain, and red eye. Endo:  Denies cold intolerance, excessive hunger, excessive thirst, and excessive urination. Heme:  Denies abnormal bruising and bleeding. Allergy:   Denies hives or rash and itching eyes.  Physical Exam  General:  Well-developedobese,in no acute distress; alert,appropriate and cooperative throughout examination HEENT: No facial asymmetry,  EOMI, No sinus tenderness, TM's Clear, oropharynx  pink and moist.   Chest: Clear to auscultation bilaterally.  CVS: S1, S2, No murmurs, No S3.   Abd: Soft, Nontender.  MS: Adequate ROM spine, hips, shoulders and knees.  Ext: No edema.   CNS: CN 2-12 intact, power tone and sensation normal throughout.   Skin: Intact, no visible lesions or rashes.  Psych: Good eye contact, normal affect.  Memory intact, not anxious or depressed appearing.    Impression & Recommendations:  Problem # 1:  OBESITY (ICD-278.00) Assessment Deteriorated  Orders: Medicare Electronic Prescription [id number])  Ht: 66 (03/16/2010)   Wt: 240.25 (03/16/2010)   BMI: 38.92 (03/16/2010) therapeutic lifestyle change discussed and encouraged pt to start phentermine also discused adverse and potentially life threateneing cardiopulmonary effects, she understands  Problem # 2:  DEPRESSION (ICD-311) Assessment: Improved  The following medications were removed from the medication list:    Zoloft 50 Mg Tabs (Sertraline hcl) .Marland Kitchen... Take 1 tablet by mouth once a day Her updated medication list for this problem includes:    Alprazolam 1 Mg Tabs (Alprazolam) .Marland Kitchen... Take 1 tablet by mouth three times a day  Problem # 3:  UNSPECIFIED ESSENTIAL HYPERTENSION (ICD-401.9) Assessment: Unchanged  Her updated medication list for this problem includes:  Amlodipine Besylate 10 Mg Tabs (Amlodipine besylate) .Marland Kitchen... Take 1 tablet by mouth once a day  Orders: Medicare Electronic Prescription [phone])  BP today: 120/82 Prior BP: 110/82 (11/12/2009)  Complete Medication List: 1)  Amlodipine Besylate 10 Mg Tabs (Amlodipine besylate) .... Take 1 tablet by mouth once a day 2)  Nexium 40 Mg Cpdr (Esomeprazole magnesium) .... Take 1 tablet by mouth  once a day 3)  Tramadol Hcl 50 Mg Tabs (Tramadol hcl) .Marland Kitchen.. 1 or 2 every 6 hrs as needed 4)  Alprazolam 1 Mg Tabs (Alprazolam) .... Take 1 tablet by mouth three times a day 5)  Phentermine Hcl 37.5 Mg Tabs (Phentermine hcl) .... Take 1 tablet by mouth once a day  Patient Instructions: 1)  Please schedule a follow-up appointment in 2 months. 2)  It is important that you exercise regularly at least 30 minutes 5 times a week. If you develop chest pain, have severe difficulty breathing, or feel very tired , stop exercising immediately and seek medical attention. 3)  You need to lose weight. Consider a lower calorie diet and regular exercise.  4)  pLS mEASURE and restrict your carbs. 5)  pLS use hALF phentermine once daily, the 30 tabs will last 2 months 6)  Eat 3 meals every day on schedule, and plan on 3 snacks of fruit or almonds Prescriptions: NEXIUM 40 MG CPDR (ESOMEPRAZOLE MAGNESIUM) Take 1 tablet by mouth once a day  #30 x 3   Entered by:   Adella Hare LPN   Authorized by:   Syliva Overman MD   Signed by:   Adella Hare LPN on 60/45/4098   Method used:   Electronically to        [facility]* (retail)       [address]       [place], [place]  [address]       Ph: [phone] or [phone]       Fax: [phone]   RxID:   [id number] PHENTERMINE HCL 37.5 MG TABS (PHENTERMINE HCL) Take 1 tablet by mouth once a day  #30 x 0   Entered and Authorized by:   Syliva Overman MD   Signed by:   Syliva Overman MD on 03/16/2010   Method used:   Printed then faxed to ...       [facility]* (retail)       [address]       [place], [place]  [address]       Ph: [phone] or [phone]       Fax: [phone]   RxID:   [id number]    Orders Added: 1)  Est. Patient Level IV [23762] 2)  Medicare Electronic Prescription [phone]

## 2010-04-08 NOTE — Assessment & Plan Note (Signed)
Summary: HARDLY CAN TALK   Vital Signs:  Patient profile:   50 year old female Weight:      228 pounds Pulse rate:   76 / minute Resp:     16 per minute BP sitting:   114 / 84  History of Present Illness: Pt is here today as f/u from her 08-06-09 OV.  She states she is having icreased post nasal drainage and cough. She states it is a hacking cough, with clear mucus.  Her cough is irritating her throat and her voice is still hoarse. She is taking Lawyer and no help at all.  She has been taking 2 Xanax along with her Ambien at night to try to help her to sleep but still coughs all night.  No fever or chills.  She denies difficulty breathing or wheezing.  She states her prev dr gave her a sample of Maxair before when she was like this but it has expired.  Hx of HTN.  Is taking medications daily.  No side effects from meds.  No chest pain or pressure.   Allergies: 1)  ! Codeine  Past History:  Past medical history reviewed for relevance to current acute and chronic problems.  Past Medical History: Reviewed history from 04/02/2009 and no changes required. Hypertension x 1996 depression anxiety [facility]ERD [facility]  [facility]  1998 scleroderma  Dr bland  1998 Disabled since  1998 OBESITY  Review of Systems General:  Denies chills, fever, and loss of appetite. ENT:  Complains of postnasal drainage and sore throat; denies earache, nasal congestion, and sinus pressure. CV:  Denies chest pain or discomfort and palpitations. Resp:  Complains of cough and sputum productive; denies shortness of breath and wheezing. Heme:  Denies fevers.  Physical Exam  General:  Well-developed,well-nourished,in no acute distress; alert,appropriate and cooperative throughout examination Head:  Normocephalic and atraumatic without obvious abnormalities. No apparent alopecia or balding. Ears:  External ear exam shows no significant lesions or deformities.  Otoscopic examination reveals clear canals, tympanic  membranes are intact bilaterally without bulging, retraction, inflammation or discharge. Hearing is grossly normal bilaterally. Nose:  External nasal examination shows no deformity or inflammation. Nasal mucosa are pink and moist without lesions or exudates. Mouth:  Oral mucosa and oropharynx without lesions or exudates.  Teeth in good repair. Neck:  No deformities, masses, or tenderness noted. Lungs:  Normal respiratory effort, chest expands symmetrically. Lungs are clear to auscultation, no crackles or wheezes. Heart:  Normal rate and regular rhythm. S1 and S2 normal without gallop, murmur, click, rub or other extra sounds. Cervical Nodes:  No lymphadenopathy noted Psych:  Oriented X3, normally interactive, good eye contact, tearful, and slightly anxious.     Impression & Recommendations:  Problem # 1:  ACUTE BRONCHITIS (ICD-466.0) Assessment Deteriorated  The following medications were removed from the medication list:    Septra Ds 800-160 Mg Tabs (Sulfamethoxazole-trimethoprim) .Marland Kitchen... Take 1 two times a day for 10 days    Benzonatate 100 Mg Caps (Benzonatate) .Marland Kitchen... Take 1 capsule every 8 hrs as needed for cough    Tussionex Pennkinetic Er 8-10 Mg/35ml Lqcr (Chlorpheniramine-hydrocodone) .Marland Kitchen... Take 1 tsp two times a day for cough Her updated medication list for this problem includes:    Doxycycline Hyclate 100 Mg Caps (Doxycycline hyclate) .Marland Kitchen... Take 1 two times a day for 10 days  Orders: Depo- Medrol 80mg  (J1040) Admin of Therapeutic Inj  intramuscular or subcutaneous ([address])  Problem # 2:  LARYNGITIS, ACUTE (ICD-464.00) Assessment: Unchanged  Problem # 3:  INSOMNIA (ICD-780.52) Assessment: Deteriorated Warned pt about combining medications or increasing doses without medical recommendation to do so.  Advised pt that this can suppress or stop her breathing or increase her risk for injury from a fall.  Her updated medication list for this problem includes:    Zolpidem Tartrate 10  Mg Tabs (Zolpidem tartrate) .Marland Kitchen... Take 1 tab by mouth at bedtime  Problem # 4:  UNSPECIFIED ESSENTIAL HYPERTENSION (ICD-401.9) Assessment: Comment Only  Her updated medication list for this problem includes:    Amlodipine Besylate 10 Mg Tabs (Amlodipine besylate) .Marland Kitchen... Take 1 tablet by mouth once a day  BP today: 114/84 Prior BP: 100/68 (08/06/2009)  Complete Medication List: 1)  Amlodipine Besylate 10 Mg Tabs (Amlodipine besylate) .... Take 1 tablet by mouth once a day 2)  Zolpidem Tartrate 10 Mg Tabs (Zolpidem tartrate) .... Take 1 tab by mouth at bedtime 3)  Nexium 40 Mg Cpdr (Esomeprazole magnesium) .... Take 1 tablet by mouth once a day 4)  Tramadol Hcl 50 Mg Tabs (Tramadol hcl) .Marland Kitchen.. 1 or 2 every 6 hrs as needed 5)  Zoloft 50 Mg Tabs (Sertraline hcl) .... Take 1 tablet by mouth once a day 6)  Alprazolam 1 Mg Tabs (Alprazolam) .... Take 1 tablet by mouth three times a day 7)  Doxycycline Hyclate 100 Mg Caps (Doxycycline hyclate) .... Take 1 two times a day for 10 days 8)  Vicodin 5-500 Mg Tabs (Hydrocodone-acetaminophen) .... Take 1 every 6 hrs as needed for cough  Patient Instructions: 1)  Follow up appt in 5 days.   2)  I have changed your antibiotic. Stop taking the previously prescribed antibiotic. 3)  I have prescribed some cough medicine for you. 4)  Do not take Ambien at night with this cough medicine. 5)  You have recieved a shot of Depo Medrol today to help. Prescriptions: VICODIN 5-500 MG TABS (HYDROCODONE-ACETAMINOPHEN) take 1 every 6 hrs as needed for cough  #30 x 0   Entered and Authorized by:   Esperanza Sheets PA   Signed by:   Esperanza Sheets PA on 08/14/2009   Method used:   Historical   RxID:   [id number] DOXYCYCLINE HYCLATE 100 MG CAPS (DOXYCYCLINE HYCLATE) take 1 two times a day for 10 days  #20 x 0   Entered and Authorized by:   Esperanza Sheets PA   Signed by:   Esperanza Sheets PA on 08/14/2009   Method used:   Electronically to        [facility]*  (retail)       [address]       [place], [place]  [address]       Ph: [phone] or [phone]       Fax: [phone]   RxID:   [id number] DOXYCYCLINE HYCLATE 100 MG CAPS (DOXYCYCLINE HYCLATE) take 1 two times a day for 10 days  #20 x 0   Entered and Authorized by:   Esperanza Sheets PA   Signed by:   Esperanza Sheets PA on 08/14/2009   Method used:   Printed then faxed to ...       [facility] ([facility]ail[facility]       [address]       [place], [place]  [address]       Ph: [phone]       Fax: [phone]  RxID:   [id number] Sandria Senter ER 8-10 MG/5ML LQCR (CHLORPHENIRAMINE-HYDROCODONE) take 1 tsp two times a day for cough  #4 oz x 0   Entered and Authorized by:   Esperanza Sheets PA   Signed by:   Esperanza Sheets PA on 08/14/2009   Method used:   Printed then faxed to ...       [facility] (retail)       [address]       [place], [place]  [address]       Ph: [phone]       Fax: [phone]   RxID:   [id number]  Phone discussion with pharmacist.  Pt is allergic to codeine but states she has taken Vicoden without problem.  Tussionex is not covered by ins,nor are any other cough syrups.   Verbal order given for Vicoden 5/500 as above. Esperanza Sheets PA  August 14, 2009 11:09 AM   Medication Administration  Injection # 1:    Medication: Depo- Medrol 80mg     Diagnosis: ACUTE BRONCHITIS (ICD-466.0)    Route: IM    Site: L deltoid    Exp Date: 04/2010    Lot #: [id number]    Mfr: Pharmacia    Comments: 80mg  given     Patient tolerated injection without complications    Given by: Everitt Amber LPN (August 14, 2009 11:42 AM)  Orders Added: 1)  Est. Patient Level IV [47425] 2)  Depo- Medrol 80mg  [J1040] 3)  Admin of Therapeutic Inj  intramuscular or subcutaneous [956[address]]

## 2010-04-08 NOTE — Letter (Signed)
Summary: MEDICAL RELEASE  MEDICAL RELEASE   Imported By: Lind Guest 09/15/2009 14:50:34  _____________________________________________________________________  External Attachment:    Type:   Image     Comment:   External Document

## 2010-04-08 NOTE — Letter (Signed)
Summary: [facility]  [facility]   Imported By: Lind Guest 01/14/2010 10:17:02  _____________________________________________________________________  External Attachment:    Type:   Image     Comment:   External Document

## 2010-05-17 ENCOUNTER — Encounter: Payer: Self-pay | Admitting: Family Medicine

## 2010-05-17 ENCOUNTER — Ambulatory Visit (INDEPENDENT_AMBULATORY_CARE_PROVIDER_SITE_OTHER): Payer: No Typology Code available for payment source | Admitting: Family Medicine

## 2010-05-17 DIAGNOSIS — R05 Cough: Secondary | ICD-10-CM

## 2010-05-17 DIAGNOSIS — E669 Obesity, unspecified: Secondary | ICD-10-CM

## 2010-05-17 DIAGNOSIS — J209 Acute bronchitis, unspecified: Secondary | ICD-10-CM

## 2010-05-17 DIAGNOSIS — R059 Cough, unspecified: Secondary | ICD-10-CM

## 2010-05-17 DIAGNOSIS — J309 Allergic rhinitis, unspecified: Secondary | ICD-10-CM | POA: Insufficient documentation

## 2010-05-21 ENCOUNTER — Encounter: Payer: Self-pay | Admitting: Family Medicine

## 2010-06-03 NOTE — Assessment & Plan Note (Signed)
Summary: F UP   Vital Signs:  Patient profile:   50 year old female Menstrual status:  hysterectomy Height:      66 inches Weight:      227 pounds BMI:     36.77 O2 Sat:      98 % Pulse rate:   85 / minute Pulse rhythm:   regular Resp:     16 per minute BP sitting:   118 / 86  (right arm) Cuff size:   large  Vitals Entered By: Everitt Amber LPN (May 17, 2010 2:46 PM)  Nutrition Counseling: Patient's BMI is greater than 25 and therefore counseled on weight management options. CC: c/o cough since saturday, bringing up clear phlegm, sinus drainage   CC:  c/o cough since saturday, bringing up clear phlegm, and sinus drainage.  History of Present Illness: Reports  that she had been well up until 3 days ago when she developed cough and chest congestion. she has been exercising alot, has cut back her caloric intake an has done extremely well with the phentermne , which she wants tocontinue. Denies recent fever or chills. Denies sinus pressure, nasal congestion , ear pain or sore throat.  Denies chest pain, palpitations, PND, orthopnea or leg swelling. Denies abdominal pain, nausea, vomitting, diarrhea or constipation. Denies change in bowel movements or bloody stool. Denies dysuria , frequency, incontinence or hesitancy. Denies  joint pain, swelling, or reduced mobility. Denies headaches, vertigo, seizures. Denies uncontrolled depression, anxiety or insomnia. Denies  rash, lesions, or itch.     Current Medications (verified): 1)  Amlodipine Besylate 10 Mg Tabs (Amlodipine Besylate) .... Take 1 Tablet By Mouth Once A Day 2)  Nexium 40 Mg Cpdr (Esomeprazole Magnesium) .... Take 1 Tablet By Mouth Once A Day 3)  Tramadol Hcl 50 Mg Tabs (Tramadol Hcl) .Marland Kitchen.. 1 or 2 Every 6 Hrs As Needed 4)  Alprazolam 1 Mg Tabs (Alprazolam) .... Take 1 Tablet By Mouth Three Times A Day 5)  Phentermine Hcl 37.5 Mg Tabs (Phentermine Hcl) .... Take 1 Tablet By Mouth Once A Day  Allergies  (verified): 1)  ! Codeine  Review of Systems      See HPI Eyes:  Denies discharge and eye pain. Resp:  Complains of cough and sputum productive; 3 dasy history of cough productive of clear sputum and clear runny nose . Endo:  Denies cold intolerance, excessive hunger, excessive thirst, and excessive urination. Heme:  Denies abnormal bruising and bleeding. Allergy:  Complains of seasonal allergies; denies hives or rash and itching eyes; increased symptoms x 3 days.  Physical Exam  General:  Well-developedobese,in no acute distress; alert,appropriate and cooperative throughout examination HEENT: No facial asymmetry,  EOMI, No sinus tenderness, TM's Clear, oropharynx  pink and moist.   Chest:adequate air entry , scattered  wheezes, no crackles. CVS: S1, S2, No murmurs, No S3.   Abd: Soft, Nontender.  MS: Adequate ROM spine, hips, shoulders and knees.  Ext: No edema.   CNS: CN 2-12 intact, power tone and sensation normal throughout.   Skin: Intact, no visible lesions or rashes.  Psych: Good eye contact, normal affect.  Memory intact, not anxious or depressed appearing.    Impression & Recommendations:  Problem # 1:  COUGH (ICD-786.2) Assessment Deteriorated  Orders: Albuterol Sulfate Sol 1mg  unit dose (J8119) Ipratropium inhalation sol. unit dose (J4782) Nebulizer Tx ([address])  Problem # 2:  OBESITY (ICD-278.00)  Ht: 66 (05/17/2010)   Wt: 227 (05/17/2010)   BMI: 36.77 (05/17/2010)  Problem #  3:  UNSPECIFIED ESSENTIAL HYPERTENSION (ICD-401.9) Assessment: Unchanged  Her updated medication list for this problem includes:    Amlodipine Besylate 10 Mg Tabs (Amlodipine besylate) .Marland Kitchen... Take 1 tablet by mouth once a day  BP today: 118/86 Prior BP: 120/82 (03/16/2010)  Problem # 4:  DEPRESSION (ICD-311) Assessment: Improved  Her updated medication list for this problem includes:    Alprazolam 1 Mg Tabs (Alprazolam) .Marland Kitchen... Take 1 tablet by mouth three times a day  Complete  Medication List: 1)  Amlodipine Besylate 10 Mg Tabs (Amlodipine besylate) .... Take 1 tablet by mouth once a day 2)  Nexium 40 Mg Cpdr (Esomeprazole magnesium) .... Take 1 tablet by mouth once a day 3)  Tramadol Hcl 50 Mg Tabs (Tramadol hcl) .Marland Kitchen.. 1 or 2 every 6 hrs as needed 4)  Alprazolam 1 Mg Tabs (Alprazolam) .... Take 1 tablet by mouth three times a day 5)  Phentermine Hcl 37.5 Mg Caps (Phentermine hcl) .... Take 1 tablet by mouth once a day  Other Orders: Depo- Medrol 80mg  (J1040) Admin of Therapeutic Inj  intramuscular or subcutaneous ([address]130)  Patient Instructions: 1)  Please schedule a follow-up appointment in 3 months. 2)  It is important that you exercise regularly at least 40 minutes 6 times a week. If you develop chest pain, have severe difficulty breathing, or feel very tired , stop exercising immediately and seek medical attention. 3)  You need to lose weight. Consider a lower calorie diet and regular exerciseCONGRATS ON WEIGHT LOSS, PLS KEEP IT UP! 4)  YOU ARE GETTING DEPOMEDROL FOR THE UNCONTROLLED ALLERGIES AND COUGH Prescriptions: ALPRAZOLAM 1 MG TABS (ALPRAZOLAM) Take 1 tablet by mouth three times a day  #90 x 3   Entered by:   Adella Hare LPN   Authorized by:   Syliva Overman MD   Signed by:   Adella Hare LPN on 86/57/8469   Method used:   Printed then faxed to ...       [facility]* (retail)       [address]       [place], [place]  [address]       Ph: [phone] or [phone]       Fax: [phone]   RxID:   [id number] AMLODIPINE BESYLATE 10 MG TABS (AMLODIPINE BESYLATE) Take 1 tablet by mouth once a day  #30 x 3   Entered by:   Adella Hare LPN   Authorized by:   Syliva Overman MD   Signed by:   Adella Hare LPN on 60/63/0160   Method used:   Printed then faxed to ...       [facility]* (retail)       [address]       [place], [place]  [address]       Ph: [phone] or  [phone]       Fax: [phone]   RxID:   [id number] PREDNISONE (PAK) 5 MG TABS (PREDNISONE) Use as directed  #21 x 0   Entered and Authorized by:   Syliva Overman MD   Signed by:   Syliva Overman MD on 05/17/2010   Method used:   Electronically to        [facility]* (retail)       [address]       [place], [place]  [address]  Ph: [phone] or [phone]       Fax: [phone]   RxID:   [id number] PHENTERMINE HCL 37.5 MG CAPS (PHENTERMINE HCL) Take 1 tablet by mouth once a day  #30 x 2   Entered and Authorized by:   Syliva Overman MD   Signed by:   Syliva Overman MD on 05/17/2010   Method used:   Printed then faxed to ...       [facility]* (retail)       [address]       [place], [place]  [address]       Ph: [phone] or [phone]       Fax: [phone]   RxID:   [id number]    Medication Administration  Injection # 1:    Medication: Depo- Medrol 80mg     Diagnosis: ALLERGIC RHINITIS CAUSE UNSPECIFIED (ICD-477.9)    Route: IM    Site: RUOQ gluteus    Exp Date: 11/12    Lot #: [id number]    Mfr: novaplus    Patient tolerated injection without complications    Given by: Adella Hare LPN (May 17, 2010 4:03 PM)  Medication # 1:    Medication: Albuterol Sulfate Sol 1mg  unit dose    Diagnosis: COUGH (ICD-786.2)    Dose: 2.5mg     Route: inhaled    Exp Date: 06/13    Lot #: [id number]    Mfr: TRC    Patient tolerated medication without complications    Given by: Adella Hare LPN (May 17, 2010 4:05 PM)  Medication # 2:    Medication: Ipratropium inhalation sol. unit dose    Diagnosis: COUGH (ICD-786.2)    Dose: 0.5mg     Route: inhaled    Exp Date: 06/13    Lot #: [id number]    Mfr: nephron pharm    Patient tolerated medication without complications    Given by: Adella Hare LPN (May 17, 2010 4:05 PM)  Orders Added: 1)  Est. Patient Level IV [73220] 2)  Depo- Medrol  80mg  [J1040] 3)  Admin of Therapeutic Inj  intramuscular or subcutaneous [96372] 4)  Albuterol Sulfate Sol 1mg  unit dose [J7613] 5)  Ipratropium inhalation sol. unit dose [J7644] 6)  Nebulizer Tx [94640]     Medication Administration  Injection # 1:    Medication: Depo- Medrol 80mg     Diagnosis: ALLERGIC RHINITIS CAUSE UNSPECIFIED (ICD-477.9)    Route: IM    Site: RUOQ gluteus    Exp Date: 11/12    Lot #: [id number]    Mfr: novaplus    Patient tolerated injection without complications    Given by: Adella Hare LPN (May 17, 2010 4:03 PM)  Medication # 1:    Medication: Albuterol Sulfate Sol 1mg  unit dose    Diagnosis: COUGH (ICD-786.2)    Dose: 2.5mg     Route: inhaled    Exp Date: 06/13    Lot #: [id number]    Mfr: TRC    Patient tolerated medication without complications    Given by: Adella Hare LPN (May 17, 2010 4:05 PM)  Medication # 2:    Medication: Ipratropium inhalation sol. unit dose    Diagnosis: COUGH (ICD-786.2)    Dose: 0.5mg     Route: inhaled    Exp Date: 06/13    Lot #: [id number]    Mfr: nephron pharm    Patient tolerated medication without complications    Given by: Adella Hare LPN (May 17, 2010  4:05 PM)  Orders Added: 1)  Est. Patient Level IV [54098] 2)  Depo- Medrol 80mg  [J1040] 3)  Admin of Therapeutic Inj  intramuscular or subcutaneous [96372] 4)  Albuterol Sulfate Sol 1mg  unit dose [J7613] 5)  Ipratropium inhalation sol. unit dose [J7644] 6)  Nebulizer Tx [phone]

## 2010-06-10 ENCOUNTER — Telehealth: Payer: Self-pay | Admitting: Family Medicine

## 2010-06-12 ENCOUNTER — Emergency Department (HOSPITAL_COMMUNITY)
Admission: EM | Admit: 2010-06-12 | Discharge: 2010-06-12 | Disposition: A | Discharge status: Home / Self Care | Payer: No Typology Code available for payment source | Attending: Emergency Medicine | Admitting: Emergency Medicine

## 2010-06-12 DIAGNOSIS — S335XXA Sprain of ligaments of lumbar spine, initial encounter: Secondary | ICD-10-CM | POA: Insufficient documentation

## 2010-06-12 DIAGNOSIS — Y998 Other external cause status: Secondary | ICD-10-CM | POA: Insufficient documentation

## 2010-06-14 NOTE — Telephone Encounter (Signed)
Called patient left message

## 2010-06-15 ENCOUNTER — Telehealth: Payer: Self-pay

## 2010-06-15 NOTE — Telephone Encounter (Signed)
Called patient back again and left message stating if she still needed me to call me back at the office

## 2010-06-16 ENCOUNTER — Other Ambulatory Visit: Payer: Self-pay | Admitting: *Deleted

## 2010-06-16 MED ORDER — ESOMEPRAZOLE MAGNESIUM 40 MG PO CPDR: 40.0000 mg | DELAYED_RELEASE_CAPSULE | Freq: Every day | ORAL | Status: DC

## 2010-06-21 ENCOUNTER — Telehealth: Payer: Self-pay

## 2010-06-21 ENCOUNTER — Other Ambulatory Visit: Payer: Self-pay

## 2010-06-21 MED ORDER — CYCLOBENZAPRINE HCL 10 MG PO TABS: 10.0000 mg | ORAL_TABLET | Freq: Every evening | ORAL | Status: AC | PRN

## 2010-06-21 NOTE — Telephone Encounter (Signed)
noted 

## 2010-06-21 NOTE — Telephone Encounter (Signed)
Refilled to [facility] pharmacy
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
## 2010-07-14 ENCOUNTER — Other Ambulatory Visit: Payer: Self-pay

## 2010-07-14 MED ORDER — ESOMEPRAZOLE MAGNESIUM 40 MG PO CPDR: 40.0000 mg | DELAYED_RELEASE_CAPSULE | Freq: Every day | ORAL | Status: DC

## 2010-07-23 NOTE — Consult Note (Signed)
NAME:  Jenna Woods, Jenna Woods NO.:  [id number]   MEDICAL RECORD NO.:  [id number]                    PATIENT TYPE:   LOCATION:                                       FACILITY:   PHYSICIAN:  R. Roetta Sessions, M.D.              DATE OF BIRTH:  02-Mar-1961   DATE OF CONSULTATION:  DATE OF DISCHARGE:                                   CONSULTATION   REQUESTING PHYSICIAN:  Dr. Despina Hidden.   PRIMARY CARE PHYSICIAN:  Dr. Parke Simmers in [place].   REASON FOR CONSULTATION:  Hemoccult positive stool.   HISTORY OF PRESENT ILLNESS:  Jenna Woods is a 50 year old African-American  female who notes an episode two weeks ago where she had severe left lower  quadrant abdominal pain which she rated a 6/10. This was followed by perfuse  diarrhea as well as large amounts of both dark and bright rectal bleeding.  She also had nausea and vomiting once and was running a fever around 102.  She became dizzy and lightheaded and sought care at [facility] emergency  room. She notes they treated her pain and gave her some IV fluids and sent  her home. She was later seen by Jenna Woods, nurse practitioner at Dr.  Forestine Chute office for her yearly exam and was found to have Hemoccult-positive  stool. She continues to complain of crampy left sided abdominal pain which  she rates a 6/10. She is also complaining of constipation now with bowel  movements about every 3 days. She denies any further fever. She does have  history of chronic GERD which is well controlled on Nexium for the last 5  years. She notes history of EGD and colonoscopy about 2 to 3 years ago;  however, she does not have any details on this and cannot remember exact  location. She denies any aspirin or NSAID use.   PAST MEDICAL HISTORY:  1.  Hypertension.  2.  SLE.  3.  Anxiety.  4.  Peptic ulcer disease 10 years ago.  5.  Urinary incontinence.  6.  Raynaud's syndrome.  7.  Chronic GERD on Nexium 5 years.   PAST SURGICAL  HISTORY:  1.  Partial hysterectomy 17 years ago.  2.  Cesarean section x2.  3.  Hand surgery and right foot surgery.   CURRENT MEDICATIONS:  1.  Procardia 60 mg daily.  2.  Xanax 1 mg t.i.d.  3.  Nexium 40 mg daily.  4.  Hydrocodone 5/500 mg p.r.n.  5.  Ambien 10 mg q.h.s. p.r.n.  6.  Detrol 4 mg daily.   ALLERGIES:  CODEINE which causes rash and ASPIRIN which causes rash.   FAMILY HISTORY:  No known family history of colorectal carcinoma, liver, or  chronic GI problems. Mother is alive at age 23 with history of hypertension.  Father deceased at age 29 with history of alcohol. She has three healthy  sisters. One brother deceased  secondary to lymphoma. Two alive and healthy.   SOCIAL HISTORY:  [name] is single and lives alone. She has two grown  healthy children. She is currently disabled. She reports a 25-year history  of smoking approximately a pack or so per week. Consumes about 1 or 2 social  drinks per mouth. Denies any current drug use. Does report remote marijuana  use.   REVIEW OF SYSTEMS:  CONSTITUTIONAL:  Weight is stable. Appetite is decreased  secondary to significant abdominal pain. Denies any fevers or chills  currently. CARDIOVASCULAR:  Denies any chest pain or palpitations.  PULMONARY:  Denies any shortness of breath, dyspnea, cough, or hemoptysis.  GENITOURINARY:  Has been followed for urinary incontinence by Dr. Parke Simmers. Is  currently on Detrol. Denies any dysuria, hematuria, or increased urinary  frequency. HEMATOLOGICAL:  Denies any history of sickle cell disease or  anemia. Does report easy bruising. Denies any epistaxis. GASTROINTESTINAL:  See HPI. Denies any dysphagia or odynophagia.   PHYSICAL EXAMINATION:  VITAL SIGNS:  Weight 222.5 pounds, height 66 inches,  blood pressure 110/88, pulse 84.  GENERAL:  [name] is a 50 year old obese African-American female who  is alert, oriented, pleasant, cooperative, and in no acute distress today.  She is  accompanied by her sister.  HEENT:  Sclerae are clear. Nonicteric. Conjunctivae pink. Oropharynx pink  and moist without any lesions.  NECK:  Supple without any mass or thyromegaly.  CHEST:  Heart regular rate and rhythm with normal S1 and S2 without any  murmurs, clicks, rubs, or gallops.  LUNGS:  Clear to auscultation bilaterally.  ABDOMEN:  Protuberant with positive bowel sounds x4. No bruits auscultated.  Soft, nondistended. She does have significant left lower quadrant tenderness  on exam. She also has mild tenderness to the right lower quadrant as well as  rebound tenderness. Negative Murphy's sign.  RECTAL:  No external lesions visualized. She does have relatively poor  sphincter tone for a lady of her age. There were no internal masses  palpated. Small amount of light brown Hemoccult negative stool was obtained  from the vault.  SKIN:  Brown, warm and dry without any rashes or jaundice.   ASSESSMENT:  [name] is a 50 year old African-American female with  episode of perfuse bloody diarrhea as well as significant left sided  abdominal pain. Diarrhea has resolved. Hematochezia and hemoccult positive  stool persists. She continues to have significant left lower quadrant  abdominal pain, and she may have diverticulitis. I feel that this is  pertinent that it be ruled out. Otherwise, she may have developed food-borne  illness although I feel abdominal pain should have subsided by now. Further  evaluation is necessary. If CT is positive for diverticulitis, will treat  with antibiotics. Otherwise would recommend further evaluation with  colonoscopy to rule out colorectal carcinoma, diverticular disease, or  colitis.   RECOMMENDATIONS:  1.  Labs today to include CBC, LFTs, and serum creatinine.  2.  CT scan of abdomen and pelvis with IV normal contrast medium as soon as      possible.  3.  If evidence of diverticulitis, will recommend antibiotic treatment.     Otherwise will  need colonoscopy in the near future.  4.  Will attempt to get old records from Dr. Janine Ores in [place] as to whether      she has had recent EGD and colonoscopy.   Would like to thank Jenna Woods, nurse practitioner, and Dr. Despina Hidden for  allowing [place] to participate in  the care of Ms. Gildner.     ________________________________________  ___________________________________________  Nicholas Lose, N.P.                  Jonathon Bellows, M.D.   KC/MEDQ  D:  11/12/2003  T:  11/12/2003  Job:  [id number]   cc:   Lazaro Arms, M.D.  [address], [address]  [place]  [place] [address]  Fax: [phone]   Renaye Rakers, M.D.  [phone] [address]. [address], [address]  [place]  [place] [address]  Fax: [phone]

## 2010-07-23 NOTE — Op Note (Signed)
NAME:  Jenna Woods, Jenna Woods                 ACCOUNT NO.:  o   MEDICAL RECORD NO.:  [id number]          PATIENT TYPE:  AMB   LOCATION:  DAY                           FACILITY:  [facility]   PHYSICIAN:  R. Roetta Sessions, M.D. DATE OF BIRTH:  1960-04-18   DATE OF PROCEDURE:  12/05/2003  DATE OF DISCHARGE:                                 OPERATIVE REPORT   PROCEDURE:  Colonoscopy with biopsy.   INDICATIONS:  The patient is a 50 year old lady who approximately four to  five weeks ago developed acute illness characterized by bilateral lower  quadrant abdominal pain and probably diarrhea and then aggressive  hematochezia,  over a couple of days.  She was seen in the office and was  referred for colonoscopy earlier this month, but was not able to keep the  appointment.  She again is clinically done well, never had symptoms prior to  this episode or since this episode.  She does have SLE and she does smoke.  Lab work was unrevealing except for a slightly elevated white count of  11,000.  Subsequent abdominal CT scan demonstrated only diverticulosis.  Colonoscopy is now being done. This procedure has been discussed with the  patient at length.  The potential risks, benefits and alternatives have been  reviewed, questions answered and she is agreeable.  Please see the  documentation in the medical record and our consultation of November 12, 2003.  No interim change.  She is at low risk for conscious sedation.   DESCRIPTION OF PROCEDURE:  Oxygen saturation, blood pressure, pulse and  respiration were monitored throughout the entirety of the procedure.  Conscious sedation with Versed 7 mg IV and Demerol 100 mg IV in divided  doses.  The instrument was the Olympus video chip system.   FINDINGS:  Digital rectal exam initially revealed no abnormalities.   ENDOSCOPIC FINDINGS:  Prep was good.   Rectum:  Examination of the rectal mucosa including retroflexed view of  the  anal verge revealed only a single  anal papilla; otherwise rectal mucosa  appeared normal.   Colon:  Colonic mucosa was surveyed from the rectosigmoid junction through  the left, transverse,  right colon to the appendiceal orifice, ileocecal  valve and cecum.  From this level  the scope was slowly withdrawn.  All  previously mentioned mucosal surfaces were again seen.  The patient had  numerous pancolonic diverticula and a 3 mm polyp at 25 cm was cold biopsy  removed.  The remainder of the mucosa appeared normal.  The patient  tolerated the procedure well and was reactive after endoscopy.   IMPRESSION:  1.  Single anal papilla; otherwise normal rectum.  2.  Pancolonic diverticula.  3.  Diminutive polyp at 25 cm, cold biopsied/removed.  4.  The remainder of the colonic mucosa normal.   Clinically this lady experienced a bout of segmental or ischemic colitis.  Smoking and SLE puts her at increased risk for such a phenomenon.  Fortunately, the best majority of these episodes are not associated with a  recurrence.   RECOMMENDATIONS:  1.  The patient is strongly admonished to stop smoking.  2.  Should she have recurrence in the future, then we wound need to consider      doing mesenteric angiography.  3.  Otherwise no specific further intervention is warranted but follow up on      pathology of polyp taken out today.  4.  Daily Metamucil or Citrucel fiber supplement.  5.  Diverticulosis literature provided to Jenna Woods.  6.  Further recommendations to follow.     Otelia Sergeant   RMR/MEDQ  D:  12/05/2003  T:  12/06/2003  Job:  [id number]   cc:   Lazaro Arms, M.D.  [address]  [place]  [place] [address]  Fax: [phone]   Renaye Rakers, M.D.  [phone] [address], [address]  [place]  [place] [address]  Fax: [phone]

## 2010-08-13 ENCOUNTER — Encounter: Payer: Self-pay | Admitting: Family Medicine

## 2010-08-17 ENCOUNTER — Ambulatory Visit (INDEPENDENT_AMBULATORY_CARE_PROVIDER_SITE_OTHER): Payer: No Typology Code available for payment source | Admitting: Family Medicine

## 2010-08-17 ENCOUNTER — Encounter: Payer: Self-pay | Admitting: Family Medicine

## 2010-08-17 DIAGNOSIS — Z13220 Encounter for screening for lipoid disorders: Secondary | ICD-10-CM

## 2010-08-17 DIAGNOSIS — K3189 Other diseases of stomach and duodenum: Secondary | ICD-10-CM

## 2010-08-17 DIAGNOSIS — I10 Essential (primary) hypertension: Secondary | ICD-10-CM

## 2010-08-17 DIAGNOSIS — R1013 Epigastric pain: Secondary | ICD-10-CM

## 2010-08-17 DIAGNOSIS — R079 Chest pain, unspecified: Secondary | ICD-10-CM | POA: Insufficient documentation

## 2010-08-17 DIAGNOSIS — E669 Obesity, unspecified: Secondary | ICD-10-CM

## 2010-08-17 DIAGNOSIS — R5381 Other malaise: Secondary | ICD-10-CM

## 2010-08-17 LAB — CBC WITH DIFFERENTIAL/PLATELET
Basophils Absolute: 0 10*3/uL (ref 0.0–0.1)
Basophils Relative: 0 % (ref 0–1)
Eosinophils Absolute: 0.2 10*3/uL (ref 0.0–0.7)
Eosinophils Relative: 2 % (ref 0–5)
MCH: 30.9 pg (ref 26.0–34.0)
MCHC: 33.8 g/dL (ref 30.0–36.0)
Neutrophils Relative %: 71 % (ref 43–77)
Platelets: 345 10*3/uL (ref 150–400)
RBC: 4.3 MIL/uL (ref 3.87–5.11)
RDW: 13.8 % (ref 11.5–15.5)

## 2010-08-17 LAB — BASIC METABOLIC PANEL
Calcium: 9.3 mg/dL (ref 8.4–10.5)
Creat: 0.74 mg/dL (ref 0.50–1.10)
Sodium: 141 mEq/L (ref 135–145)

## 2010-08-17 LAB — LIPID PANEL
Cholesterol: 147 mg/dL (ref 0–200)
HDL: 62 mg/dL (ref >39)
Total CHOL/HDL Ratio: 2.4 Ratio

## 2010-08-17 NOTE — Progress Notes (Signed)
  Subjective:    Patient ID: Jenna Woods, female    DOB: 1960-05-16, 50 y.o.   MRN: [id number]  HPI Intermittent chest tightness x2 months, radiates to lower abdomen, back through both arms and to neck, No associated  Nausea, diaphoresis or light headedness. Duration 5 to 10 mins , approx 3 times per week , rated at an 8 Activity does not aggravate this, happens at rest.No palpitations, pND , orthopneaa or leg swelling. No fam h/o heart disease Denies depression and anxiety has lessened she has now "fostered" a 63 y/o girl who has been her neighbor and needs help this has caused great joy  Review of Systems Denies recent fever or chills. Denies sinus pressure, nasal congestion, ear pain or sore throat. Denies chest congestion, productive cough or wheezing.  Denies abdominal pain, nausea, vomiting,diarrhea or constipation.  Denies rectal bleeding or change in bowel movement. Denies dysuria, frequency, hesitancy or incontinence. Denies joint pain, swelling and limitation in mobility. Denies headaches, seizure, numbness, or tingling. Denies depression, anxiety or insomnia. Denies skin break down or rash.        Objective:   Physical Exam Patient alert and oriented and in no Cardiopulmonary distress.  HEENT: No facial asymmetry, EOMI, no sinus tenderness, TM's clear, Oropharynx pink and moist.  Neck supple no adenopathy.  Chest: Clear to auscultation bilaterally.No chest wall tenderness  CVS: S1, S2 no murmurs, no S3.  ABD: Soft non tender. Bowel sounds normal.  Ext: No edema  MS: Adequate ROM spine, shoulders, hips and knees.  Skin: Intact, no ulcerations or rash noted.  Psych: Good eye contact, normal affect. Memory intact not anxious or depressed appearing.  CNS: CN 2-12 intact, power, tone and sensation normal throughout.        Assessment & Plan:

## 2010-08-17 NOTE — Patient Instructions (Signed)
F/u in 8 to 10 weeks.  It is important that you exercise regularly at least 30 minutes 5 times a week. If you develop chest pain, have severe difficulty breathing, or feel very tired, stop exercising immediately and seek medical attention  A healthy diet is rich in fruit, vegetables and whole grains. Poultry fish, nuts and beans are a healthy choice for protein rather then red meat. A low sodium diet and drinking 64 ounces of water daily is generally recommended. Oils and sweet should be limited. Carbohydrates especially for those who are diabetic or overweight, should be limited to 34-45 gram per meal. It is important to eat on a regular schedule, at least 3 times daily. Snacks should be primarily fruits, vegetables or nuts.   No phentermine until the heart specialist sees you, and I will do an EKG in the office today.  Fasting labs asap

## 2010-08-18 NOTE — Assessment & Plan Note (Signed)
New onset of non specific chest pain, history is not classic for heart disease, however warrants further cardiac eval, EKG shows non specific t changes. She is to discontinue phentermine until cardiology has evaluated her

## 2010-08-18 NOTE — Assessment & Plan Note (Signed)
Unchanged, pt encouraged to reduce caloric intake, she is to continue daily exercise

## 2010-08-18 NOTE — Assessment & Plan Note (Signed)
Controlled, no change in medication  

## 2010-08-19 ENCOUNTER — Encounter: Payer: Self-pay | Admitting: Family Medicine

## 2010-08-24 ENCOUNTER — Ambulatory Visit (INDEPENDENT_AMBULATORY_CARE_PROVIDER_SITE_OTHER): Payer: No Typology Code available for payment source | Admitting: Cardiology

## 2010-08-24 ENCOUNTER — Encounter: Payer: Self-pay | Admitting: Cardiology

## 2010-08-24 DIAGNOSIS — M349 Systemic sclerosis, unspecified: Secondary | ICD-10-CM

## 2010-08-24 DIAGNOSIS — I10 Essential (primary) hypertension: Secondary | ICD-10-CM

## 2010-08-24 DIAGNOSIS — F17201 Nicotine dependence, unspecified, in remission: Secondary | ICD-10-CM

## 2010-08-24 DIAGNOSIS — K219 Gastro-esophageal reflux disease without esophagitis: Secondary | ICD-10-CM

## 2010-08-24 DIAGNOSIS — R079 Chest pain, unspecified: Secondary | ICD-10-CM

## 2010-08-24 DIAGNOSIS — G4730 Sleep apnea, unspecified: Secondary | ICD-10-CM

## 2010-08-24 DIAGNOSIS — M329 Systemic lupus erythematosus, unspecified: Secondary | ICD-10-CM

## 2010-08-24 DIAGNOSIS — E669 Obesity, unspecified: Secondary | ICD-10-CM

## 2010-08-24 NOTE — Progress Notes (Signed)
HPI:  Jenna Woods is seen at the kind request of Dr. Lodema Hong for evaluation of chest discomfort.  This nice woman has enjoyed generally excellent health with few cardiovascular risk factors.  She has had problems with obesity, but has no hypertension, diabetes nor hyperlipidemia.  She has a remote and modest history of tobacco use.  She has never had any known cardiac problems nor been evaluated by a cardiologist.  Over the past 2 months, she has noted episodes of chest tightness.  These typically occur while she is at rest and resolved spontaneously.  There is been no associated diaphoresis, dyspnea nor nausea.  There is no relationship to exertion, which she tolerates without difficulty.  She does describe some emotional issues both remote and recent.  She has chronically been treated with benzodiazepines and sees a psychologist for assistance with anxiety and depression.  She is experiencing vasomotor instability and tests by her gynecologist suggests that she is perimenopausal.  Current Outpatient Prescriptions on File Prior to Visit  Medication Sig Dispense Refill  . ALPRAZolam (XANAX) 1 MG tablet Take 1 mg by mouth 3 (three) times daily.        Marland Kitchen amLODipine (NORVASC) 10 MG tablet Take 10 mg by mouth daily.       Marland Kitchen esomeprazole (NEXIUM) 40 MG capsule Take 1 capsule (40 mg total) by mouth daily before breakfast.  30 capsule  2  . phentermine 37.5 MG capsule Take 37.5 mg by mouth daily.          Allergies  Allergen Reactions  . Codeine       Past Medical History  Diagnosis Date  . Hypertension 1996  . Depression with anxiety   . GERD (gastroesophageal reflux disease)   . Scleroderma 1998  . Obesity   . Systemic lupus erythematosus 1998    [facility] at [facility]     Past Surgical History  Procedure Date  . Finger amputation     Third finger, bilateral, distal  . Abdominal hysterectomy 1990    Neoplasm  . Cesarean section     x2  . Vascular surgery     Hands, bilaterally, [facility]  . Colonoscopy 2005     Family History  Problem Relation Age of Onset  . Rheum arthritis Mother   . Dementia Mother   . Hypertension Mother   . Cirrhosis Father     liver   . Alcohol abuse Father   . Cancer Brother     lymph node   . Heart disease Brother      History   Social History  . Marital Status: Single    Spouse Name: N/A    Number of Children: N/A  . Years of Education: N/A   Occupational History  . 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 Smoking status: Former Games developer  . Smokeless tobacco: Not on file  . Alcohol Use: Yes     quit in 2004  . Drug Use: Yes     use to use pot   . Sexually Active: Not on file   Other Topics Concern  . Not on file   Social History Narrative  . No narrative on file     ROS: Requires corrective lenses for near vision; occasional palpitations; history of peptic ulcer disease and gastroesophageal reflux disease; urinary frequency; diffuse arthritic discomfort.    All other systems reviewed and are negative.  PHYSICAL EXAM:  BP 123/86  Pulse 91  Ht 5\' 6"  (1.676 m)  Wt 224 lb (101.606 kg)  BMI 36.15 kg/m2  SpO2 94%  General-Well-developed; no acute distress Body Habitus-proportionate weight and height HEENT-[place]/AT; PERRL; EOM intact; conjunctiva and lids nl Neck-No JVD; no carotid bruits Endocrine-No thyromegaly Lungs-Clear lung fields; resonant percussion; normal I-to-E ratio Cardiovascular- normal PMI; normal S1 and S2 Abdomen-BS normal; soft and non-tender without masses or organomegaly Musculoskeletal-No deformities, cyanosis or clubbing Neurologic-Nl cranial nerves; symmetric strength and tone Skin- Warm, no significant lesions Extremities-Nl distal pulses; no edema  EKG:  Normal sinus rhythm; left atrial abnormality; borderline QT prolongation; otherwise normal.  No previous tracing for comparison.  ASSESSMENT AND PLAN:

## 2010-08-24 NOTE — Patient Instructions (Signed)
Your physician recommends that you continue on your current medications as directed. Please refer to the Current Medication list given to you today.  Your physician has requested that you have a stress echocardiogram. For further information please visit [email or web address] Please follow instruction sheet as given.  Your physician recommends that you schedule a follow-up appointment in: as needed, we will contact you with results on your Stress Echo

## 2010-08-27 ENCOUNTER — Encounter: Payer: Self-pay | Admitting: Cardiology

## 2010-08-27 ENCOUNTER — Encounter: Payer: Self-pay | Admitting: *Deleted

## 2010-08-27 DIAGNOSIS — F17218 Nicotine dependence, cigarettes, with other nicotine-induced disorders: Secondary | ICD-10-CM | POA: Insufficient documentation

## 2010-08-27 NOTE — Assessment & Plan Note (Addendum)
Chest discomfort is atypical and likely not of cardiac origin.  A stress echocardiogram will be performed to verify that impression.  If negative, I see no need for continuing Cardiology care and will not plan a routine return office visit.

## 2010-08-27 NOTE — Assessment & Plan Note (Signed)
Blood pressure control is excellent with current medication.  Recent chemistry profile, CBC and TSH are normal.

## 2010-08-27 NOTE — Assessment & Plan Note (Signed)
Weight loss, reduced caloric intake and increased exercise recommended.  I doubt that phentermine offers substantial benefit, but I see no specific risk in continuing that therapy.

## 2010-09-01 ENCOUNTER — Ambulatory Visit (HOSPITAL_COMMUNITY): Payer: No Typology Code available for payment source

## 2010-09-01 ENCOUNTER — Ambulatory Visit (HOSPITAL_COMMUNITY)
Admission: RE | Admit: 2010-09-01 | Discharge: 2010-09-01 | Disposition: A | Discharge status: Home / Self Care | Payer: No Typology Code available for payment source | Source: Ambulatory Visit | Attending: Cardiology | Admitting: Cardiology

## 2010-09-01 DIAGNOSIS — R079 Chest pain, unspecified: Secondary | ICD-10-CM | POA: Insufficient documentation

## 2010-09-01 DIAGNOSIS — R072 Precordial pain: Secondary | ICD-10-CM

## 2010-09-01 DIAGNOSIS — I10 Essential (primary) hypertension: Secondary | ICD-10-CM | POA: Insufficient documentation

## 2010-09-02 ENCOUNTER — Encounter: Payer: Self-pay | Admitting: Cardiology

## 2010-09-06 ENCOUNTER — Other Ambulatory Visit: Payer: Self-pay | Admitting: Family Medicine

## 2010-09-06 ENCOUNTER — Telehealth: Payer: Self-pay | Admitting: Family Medicine

## 2010-09-06 NOTE — Telephone Encounter (Signed)
Pt reports an approx 5 year h/o urinary incontinence in the past, has seen urologist in the past, not much help, states her incontinence is messing up her entire life ,needs referral to dr Annabell Howells or [place] for eval and mx ofincontinence, referral entered

## 2010-09-20 ENCOUNTER — Telehealth: Payer: Self-pay | Admitting: Cardiology

## 2010-09-20 NOTE — Telephone Encounter (Signed)
Patient would like results of stress echo/tg

## 2010-09-20 NOTE — Telephone Encounter (Signed)
Results of stress test left on machine

## 2010-09-21 ENCOUNTER — Ambulatory Visit (INDEPENDENT_AMBULATORY_CARE_PROVIDER_SITE_OTHER): Payer: No Typology Code available for payment source | Admitting: Urology

## 2010-09-21 DIAGNOSIS — N3941 Urge incontinence: Secondary | ICD-10-CM

## 2010-09-21 DIAGNOSIS — R3915 Urgency of urination: Secondary | ICD-10-CM

## 2010-09-21 DIAGNOSIS — R350 Frequency of micturition: Secondary | ICD-10-CM

## 2010-10-13 ENCOUNTER — Other Ambulatory Visit: Payer: Self-pay | Admitting: Family Medicine

## 2010-10-25 ENCOUNTER — Encounter: Payer: Self-pay | Admitting: Family Medicine

## 2010-10-26 ENCOUNTER — Ambulatory Visit (INDEPENDENT_AMBULATORY_CARE_PROVIDER_SITE_OTHER): Payer: No Typology Code available for payment source | Admitting: Family Medicine

## 2010-10-26 ENCOUNTER — Encounter: Payer: Self-pay | Admitting: Family Medicine

## 2010-10-26 VITALS — BP 118/84 | HR 80 | Resp 16 | Ht 65.25 in | Wt 224.8 lb

## 2010-10-26 DIAGNOSIS — R32 Unspecified urinary incontinence: Secondary | ICD-10-CM | POA: Insufficient documentation

## 2010-10-26 DIAGNOSIS — I10 Essential (primary) hypertension: Secondary | ICD-10-CM

## 2010-10-26 DIAGNOSIS — F329 Major depressive disorder, single episode, unspecified: Secondary | ICD-10-CM

## 2010-10-26 DIAGNOSIS — F3289 Other specified depressive episodes: Secondary | ICD-10-CM

## 2010-10-26 DIAGNOSIS — E669 Obesity, unspecified: Secondary | ICD-10-CM

## 2010-10-26 DIAGNOSIS — K219 Gastro-esophageal reflux disease without esophagitis: Secondary | ICD-10-CM

## 2010-10-26 MED ORDER — SOLIFENACIN SUCCINATE 10 MG PO TABS: 10.0000 mg | ORAL_TABLET | Freq: Every day | ORAL | Status: DC

## 2010-10-26 NOTE — Patient Instructions (Signed)
F/u in 3 months.  It is important that you exercise regularly at least  60 minutes 5 times a week. If you develop chest pain, have severe difficulty breathing, or feel very tired, stop exercising immediately and seek medical attention   Pls follow a 1200 to 1500 cal diet.  Resume phentermine one daily.  Goal weight loss is 10 pounds in the next 3 months  Ne [facility] ed sent for incontinence, vesicare.  tdaP today, and flu vaccine available sept 12 or after

## 2010-10-26 NOTE — Assessment & Plan Note (Signed)
Reports excellent response to vesicare, tried by urology, wants script

## 2010-10-29 ENCOUNTER — Telehealth: Payer: Self-pay

## 2010-10-29 MED ORDER — OXYBUTYNIN CHLORIDE 5 MG PO TABS: 5.0000 mg | ORAL_TABLET | Freq: Two times a day (BID) | ORAL | Status: DC

## 2010-10-29 NOTE — Telephone Encounter (Signed)
Changed to alternative per Dr Jeanice Lim

## 2010-10-29 NOTE — Telephone Encounter (Signed)
Change to alternative and let her know pls

## 2010-11-03 ENCOUNTER — Other Ambulatory Visit: Payer: Self-pay

## 2010-11-03 MED ORDER — ALPRAZOLAM 1 MG PO TABS: 1.0000 mg | ORAL_TABLET | Freq: Three times a day (TID) | ORAL | Status: DC

## 2010-11-08 NOTE — Assessment & Plan Note (Signed)
Markedly improved, on xanax only at this time

## 2010-11-08 NOTE — Progress Notes (Signed)
  Subjective:    Patient ID: Jenna Woods, female    DOB: October 31, 1960, 50 y.o.   MRN: [id number]  HPI The PT is here for follow up and re-evaluation of chronic medical conditions, medication management and review of any available recent lab and radiology data.  Preventive health is updated, specifically  Cancer screening and Immunization.   Questions or concerns regarding consultations or procedures which the PT has had in the interim are  addressed. The PT denies any adverse reactions to current medications since the last visit.  Recently had a trial of vesicare by urology, very pleased with result and wants a script There are no new concerns.  There are no specific complaints      Review of Systems See HPI Denies recent fever or chills. Denies sinus pressure, nasal congestion, ear pain or sore throat. Denies chest congestion, productive cough or wheezing. Denies chest pains, palpitations and leg swelling Denies abdominal pain, nausea, vomiting,diarrhea or constipation.   Denies dysuria, frequency, hesitancy and reports improvement in her incontinence. Denies joint pain, swelling and limitation in mobility. Denies headaches, seizures, numbness, or tingling. Denies depression,or uncontrolled  anxiety or insomnia. Denies skin break down or rash.        Objective:   Physical Exam Patient alert and oriented and in no cardiopulmonary distress.  HEENT: No facial asymmetry, EOMI, no sinus tenderness,  oropharynx pink and moist.  Neck supple no adenopathy.  Chest: Clear to auscultation bilaterally.  CVS: S1, S2 no murmurs, no S3.  ABD: Soft non tender. Bowel sounds normal.  Ext: No edema  MS: Adequate ROM spine, shoulders, hips and knees.  Skin: Intact, no ulcerations or rash noted.  Psych: Good eye contact, normal affect. Memory intact not anxious or depressed appearing.  CNS: CN 2-12 intact, power, tone and sensation normal throughout.        Assessment & Plan:

## 2010-11-08 NOTE — Assessment & Plan Note (Signed)
Controlled, no change in medication  

## 2010-11-08 NOTE — Assessment & Plan Note (Signed)
Improved. Pt applauded on succesful weight loss through lifestyle change, and encouraged to continue same. Weight loss goal set for the next several months. Continue phentermine as before, and regular exercise

## 2010-11-24 ENCOUNTER — Telehealth: Payer: Self-pay | Admitting: Family Medicine

## 2010-11-24 NOTE — Telephone Encounter (Signed)
CALLED PATIENT, NO ANSWER °

## 2010-11-25 NOTE — Telephone Encounter (Signed)
Called patient, no answer 

## 2010-12-02 ENCOUNTER — Other Ambulatory Visit: Payer: Self-pay | Admitting: Family Medicine

## 2010-12-02 DIAGNOSIS — Z139 Encounter for screening, unspecified: Secondary | ICD-10-CM

## 2010-12-12 ENCOUNTER — Other Ambulatory Visit: Payer: Self-pay | Admitting: Family Medicine

## 2010-12-27 ENCOUNTER — Ambulatory Visit (HOSPITAL_COMMUNITY)
Admission: RE | Admit: 2010-12-27 | Discharge: 2010-12-27 | Disposition: A | Discharge status: Home / Self Care | Payer: No Typology Code available for payment source | Source: Ambulatory Visit | Attending: Family Medicine | Admitting: Family Medicine

## 2010-12-27 ENCOUNTER — Telehealth: Payer: Self-pay | Admitting: Family Medicine

## 2010-12-27 DIAGNOSIS — Z1231 Encounter for screening mammogram for malignant neoplasm of breast: Secondary | ICD-10-CM | POA: Insufficient documentation

## 2010-12-27 DIAGNOSIS — Z139 Encounter for screening, unspecified: Secondary | ICD-10-CM

## 2010-12-27 NOTE — Telephone Encounter (Signed)
I tried to directly speak with pt,had  To leave a message.  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 Any other concerns let me know

## 2010-12-28 ENCOUNTER — Encounter: Payer: Self-pay | Admitting: Family Medicine

## 2010-12-29 ENCOUNTER — Encounter: Payer: Self-pay | Admitting: Family Medicine

## 2010-12-29 ENCOUNTER — Ambulatory Visit (INDEPENDENT_AMBULATORY_CARE_PROVIDER_SITE_OTHER): Payer: No Typology Code available for payment source | Admitting: Family Medicine

## 2010-12-29 VITALS — BP 108/78 | HR 81 | Ht 66.0 in | Wt 216.1 lb

## 2010-12-29 DIAGNOSIS — Z0289 Encounter for other administrative examinations: Secondary | ICD-10-CM

## 2010-12-29 DIAGNOSIS — R32 Unspecified urinary incontinence: Secondary | ICD-10-CM

## 2010-12-29 DIAGNOSIS — K219 Gastro-esophageal reflux disease without esophagitis: Secondary | ICD-10-CM

## 2010-12-29 DIAGNOSIS — Z024 Encounter for examination for driving license: Secondary | ICD-10-CM

## 2010-12-29 DIAGNOSIS — Z23 Encounter for immunization: Secondary | ICD-10-CM

## 2010-12-29 DIAGNOSIS — E669 Obesity, unspecified: Secondary | ICD-10-CM

## 2010-12-29 DIAGNOSIS — I10 Essential (primary) hypertension: Secondary | ICD-10-CM

## 2010-12-29 MED ORDER — OXYBUTYNIN CHLORIDE ER 5 MG PO TB24: 5.0000 mg | ORAL_TABLET | Freq: Every day | ORAL | Status: DC

## 2010-12-29 MED ORDER — INFLUENZA VAC TYPES A & B PF IM SUSP: 0.5000 mL | Freq: Once | INTRAMUSCULAR | Status: DC

## 2010-12-29 NOTE — Progress Notes (Signed)
  Subjective:    Patient ID: Jenna Woods, female    DOB: 17-Aug-1960, 50 y.o.   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 Current dose states xanax 1mg  3 times daily, last filled 08/30. Pt states she has been taking one daily since telephone  discussion, she has 42 left. She wants to be able to drive to inc her income Dec 31, she is to take 1 daily for 3 more weeks then half daily . She has no other concerns and will f/u on her chronic problems while here  Review of Systems See HPI Denies recent fever or chills. Denies sinus pressure, nasal congestion, ear pain or sore throat. Denies chest congestion, productive cough or wheezing. Denies chest pains, palpitations and leg swelling Denies abdominal pain, nausea, vomiting,diarrhea or constipation.   Denies dysuria, frequency, hesitancy or incontinence. Denies joint pain, swelling and limitation in mobility. Denies headaches, seizures, numbness, or tingling. Denies depression, anxiety or insomnia. Denies skin break down or rash.        Objective:   Physical Exam Patient alert and oriented and in no cardiopulmonary distress.  HEENT: No facial asymmetry, EOMI, no sinus tenderness,  oropharynx pink and moist.  Neck supple no adenopathy.  Chest: Clear to auscultation bilaterally.  CVS: S1, S2 no murmurs, no S3.  ABD: Soft non tender. Bowel sounds normal.  Ext: No edema  MS: Adequate ROM spine, shoulders, hips and knees.  Skin: Intact, no ulcerations or rash noted.  Psych: Good eye contact, normal affect. Memory intact not anxious or depressed appearing.  CNS: CN 2-12 intact, power, tone and sensation normal throughout.        Assessment & Plan:

## 2010-12-29 NOTE — Telephone Encounter (Signed)
Has a appointment today.

## 2010-12-29 NOTE — Patient Instructions (Addendum)
Cancel November appt., f/u end January  Congrats on weight loss keep it up!!  You may collect your driver's license form today.   TdAP and flu vaccine today  Reduce xanax to half tablet at night by the 3rd week in November, and by Dec 31 no more xanax.

## 2011-01-10 ENCOUNTER — Ambulatory Visit: Payer: No Typology Code available for payment source | Admitting: Family Medicine

## 2011-01-10 NOTE — Assessment & Plan Note (Signed)
Controlled, no change in medication  

## 2011-01-10 NOTE — Assessment & Plan Note (Signed)
Improved. Pt applauded on succesful weight loss through lifestyle change, and encouraged to continue same. Weight loss goal set for the next several months.  

## 2011-01-10 NOTE — Assessment & Plan Note (Signed)
Improved and controlled on current med 

## 2011-01-31 ENCOUNTER — Ambulatory Visit: Payer: No Typology Code available for payment source | Admitting: Family Medicine

## 2011-01-31 ENCOUNTER — Emergency Department (HOSPITAL_COMMUNITY)
Admission: EM | Admit: 2011-01-31 | Discharge: 2011-01-31 | Discharge status: Against Medical Advice | Payer: No Typology Code available for payment source

## 2011-02-15 ENCOUNTER — Telehealth: Payer: Self-pay | Admitting: Family Medicine

## 2011-02-15 ENCOUNTER — Encounter: Payer: Self-pay | Admitting: Family Medicine

## 2011-02-15 NOTE — Telephone Encounter (Signed)
pls give her appt tomorrow in the slot we discussed, I have also sent a flag

## 2011-02-15 NOTE — Telephone Encounter (Signed)
Appointment scheduled for 02/16/11 at 2:30

## 2011-02-16 ENCOUNTER — Encounter: Payer: Self-pay | Admitting: Family Medicine

## 2011-02-16 ENCOUNTER — Ambulatory Visit (INDEPENDENT_AMBULATORY_CARE_PROVIDER_SITE_OTHER): Payer: Medicaid Other | Admitting: Family Medicine

## 2011-02-16 VITALS — BP 130/70 | HR 98 | Temp 98.6°F | Resp 16 | Ht 66.0 in | Wt 212.1 lb

## 2011-02-16 DIAGNOSIS — H109 Unspecified conjunctivitis: Secondary | ICD-10-CM | POA: Insufficient documentation

## 2011-02-16 DIAGNOSIS — I10 Essential (primary) hypertension: Secondary | ICD-10-CM

## 2011-02-16 DIAGNOSIS — J209 Acute bronchitis, unspecified: Secondary | ICD-10-CM

## 2011-02-16 DIAGNOSIS — R32 Unspecified urinary incontinence: Secondary | ICD-10-CM

## 2011-02-16 DIAGNOSIS — J019 Acute sinusitis, unspecified: Secondary | ICD-10-CM | POA: Insufficient documentation

## 2011-02-16 MED ORDER — ALBUTEROL SULFATE (2.5 MG/3ML) 0.083% IN NEBU
2.5000 mg | INHALATION_SOLUTION | Freq: Once | RESPIRATORY_TRACT | Status: AC
  Administered 2011-02-16: 2.5 mg via RESPIRATORY_TRACT

## 2011-02-16 MED ORDER — PROMETHAZINE-DM 6.25-15 MG/5ML PO SYRP: ORAL_SOLUTION | ORAL | Status: DC

## 2011-02-16 MED ORDER — POLYMYXIN B-TRIMETHOPRIM 10000-0.1 UNIT/ML-% OP SOLN: 1.0000 [drp] | OPHTHALMIC | Status: AC

## 2011-02-16 MED ORDER — IPRATROPIUM BROMIDE 0.02 % IN SOLN
0.5000 mg | Freq: Once | RESPIRATORY_TRACT | Status: AC
  Administered 2011-02-16: 0.5 mg via RESPIRATORY_TRACT

## 2011-02-16 MED ORDER — OXYBUTYNIN CHLORIDE ER 5 MG PO TB24: 5.0000 mg | ORAL_TABLET | Freq: Every day | ORAL | Status: DC

## 2011-02-16 MED ORDER — ESOMEPRAZOLE MAGNESIUM 40 MG PO CPDR: DELAYED_RELEASE_CAPSULE | ORAL | Status: DC

## 2011-02-16 MED ORDER — PENICILLIN V POTASSIUM 500 MG PO TABS: 500.0000 mg | ORAL_TABLET | Freq: Three times a day (TID) | ORAL | Status: AC

## 2011-02-16 MED ORDER — CEFTRIAXONE SODIUM 500 MG IJ SOLR
500.0000 mg | Freq: Once | INTRAMUSCULAR | Status: AC
  Administered 2011-02-16: 500 mg via INTRAMUSCULAR

## 2011-02-16 MED ORDER — BENZONATATE 100 MG PO CAPS: 100.0000 mg | ORAL_CAPSULE | Freq: Four times a day (QID) | ORAL | Status: DC | PRN

## 2011-02-16 NOTE — Assessment & Plan Note (Signed)
Rocephin in the office and penicillin prescribed

## 2011-02-16 NOTE — Assessment & Plan Note (Addendum)
3 day history topical antibiotic prescribed

## 2011-02-16 NOTE — Patient Instructions (Addendum)
F/u as before.  You are being treated for conjunctivitis of both eyes, and will get information on this.  You are being treated for bronchitis, neb ion office and rocephin, cxr, and meds to pharmacy, this will also treat the sinusitis   Conjunctivitis Conjunctivitis is commonly called "pink eye." Conjunctivitis can be caused by bacterial or viral infection, allergies, or injuries. There is usually redness of the lining of the eye, itching, discomfort, and sometimes discharge. There may be deposits of matter along the eyelids. A viral infection usually causes a watery discharge, while a bacterial infection causes a yellowish, thick discharge. Pink eye is very contagious and spreads by direct contact. You may be given antibiotic eyedrops as part of your treatment. Before using your eye medicine, remove all drainage from the eye by washing gently with warm water and cotton balls. Continue to use the medication until you have awakened 2 mornings in a row without discharge from the eye. Do not rub your eye. This increases the irritation and helps spread infection. Use separate towels from other household members. Wash your hands with soap and water before and after touching your eyes. Use cold compresses to reduce pain and sunglasses to relieve irritation from light. Do not wear contact lenses or wear eye makeup until the infection is gone. SEEK MEDICAL CARE IF:   Your symptoms are not better after 3 days of treatment.   You have increased pain or trouble seeing.   The outer eyelids become very red or swollen.  Document Released: 03/31/2004 Document Revised: 11/03/2010 Document Reviewed: 02/21/2005 [facility] Patient Information 2012 [place], [place].

## 2011-02-17 ENCOUNTER — Other Ambulatory Visit: Payer: Self-pay

## 2011-02-18 ENCOUNTER — Other Ambulatory Visit: Payer: Self-pay

## 2011-02-18 MED ORDER — FLUCONAZOLE 150 MG PO TABS: 150.0000 mg | ORAL_TABLET | Freq: Once | ORAL | Status: AC

## 2011-02-18 NOTE — Progress Notes (Signed)
Subjective:     Patient ID: Charlynn Court, female   DOB: 1960/06/30, 50 y.o.   MRN: [id number]  HPI 5 day h/o head and chest congestion, chills, low grade fever with yellow nasal drainage and cough productive of yellow sputu. Eyes have been itchy with yellow drainage and stickiness. No pain, no loss of vision  Review of Systems See HPI  Denies chest pains, palpitations and leg swelling Denies abdominal pain, nausea, vomiting,diarrhea or constipation.   Denies dysuria, frequency, hesitancy or incontinence. Denies joint pain, swelling and limitation in mobility. Denies headaches, seizures, numbness, or tingling. Denies depression, anxiety or insomnia. Denies skin break down or rash.        Objective:   Physical Exam Patient alert and oriented and in no cardiopulmonary distress.  HEENT: No facial asymmetry, EOMI, maxillary  sinus tenderness,  oropharynx pink and moist.  Neck supple anterior adenopathy.Erythema of conjunctiva with yellow drainage  Chest: decreased air entry bilateral crackles, no wheezes CVS: S1, S2 no murmurs, no S3.  ABD: Soft non tender. Bowel sounds normal.  Ext: No edema  MS: Adequate ROM spine, shoulders, hips and knees.  Skin: Intact, no ulcerations or rash noted.  Psych: Good eye contact, normal affect. Memory intact not anxious or depressed appearing.  CNS: CN 2-12 intact, power, tone and sensation normal throughout.     Assessment:         Plan:

## 2011-02-18 NOTE — Assessment & Plan Note (Signed)
Antibiotics prescribed and administered

## 2011-02-18 NOTE — Assessment & Plan Note (Signed)
Controlled, no change in medication  

## 2011-02-24 ENCOUNTER — Telehealth: Payer: Self-pay | Admitting: Family Medicine

## 2011-02-24 ENCOUNTER — Other Ambulatory Visit: Payer: Self-pay | Admitting: Family Medicine

## 2011-02-24 MED ORDER — FLUCONAZOLE 150 MG PO TABS: 150.0000 mg | ORAL_TABLET | Freq: Once | ORAL | Status: AC

## 2011-02-24 NOTE — Telephone Encounter (Signed)
rx sent in pls let her know

## 2011-02-24 NOTE — Telephone Encounter (Signed)
Can I send in diflucan? She wanted this sent at the visit and I thought I sent a flag, sorry

## 2011-03-08 DIAGNOSIS — R7303 Prediabetes: Secondary | ICD-10-CM

## 2011-03-08 HISTORY — DX: Prediabetes: R73.03

## 2011-03-23 ENCOUNTER — Encounter: Payer: Self-pay | Admitting: Family Medicine

## 2011-03-28 ENCOUNTER — Ambulatory Visit (HOSPITAL_COMMUNITY)
Admission: RE | Admit: 2011-03-28 | Discharge: 2011-03-28 | Disposition: A | Discharge status: Home / Self Care | Payer: No Typology Code available for payment source | Source: Ambulatory Visit | Attending: Family Medicine | Admitting: Family Medicine

## 2011-03-28 ENCOUNTER — Encounter: Payer: Self-pay | Admitting: Family Medicine

## 2011-03-28 ENCOUNTER — Ambulatory Visit (INDEPENDENT_AMBULATORY_CARE_PROVIDER_SITE_OTHER): Payer: No Typology Code available for payment source | Admitting: Family Medicine

## 2011-03-28 VITALS — BP 124/76 | HR 105 | Resp 18 | Ht 66.0 in | Wt 217.1 lb

## 2011-03-28 DIAGNOSIS — I10 Essential (primary) hypertension: Secondary | ICD-10-CM | POA: Insufficient documentation

## 2011-03-28 DIAGNOSIS — R059 Cough, unspecified: Secondary | ICD-10-CM | POA: Insufficient documentation

## 2011-03-28 DIAGNOSIS — E669 Obesity, unspecified: Secondary | ICD-10-CM

## 2011-03-28 DIAGNOSIS — J209 Acute bronchitis, unspecified: Secondary | ICD-10-CM

## 2011-03-28 DIAGNOSIS — R32 Unspecified urinary incontinence: Secondary | ICD-10-CM

## 2011-03-28 DIAGNOSIS — R918 Other nonspecific abnormal finding of lung field: Secondary | ICD-10-CM | POA: Insufficient documentation

## 2011-03-28 DIAGNOSIS — R05 Cough: Secondary | ICD-10-CM | POA: Insufficient documentation

## 2011-03-28 DIAGNOSIS — J309 Allergic rhinitis, unspecified: Secondary | ICD-10-CM

## 2011-03-28 DIAGNOSIS — J189 Pneumonia, unspecified organism: Secondary | ICD-10-CM

## 2011-03-28 MED ORDER — ESOMEPRAZOLE MAGNESIUM 40 MG PO CPDR: DELAYED_RELEASE_CAPSULE | ORAL | Status: DC

## 2011-03-28 MED ORDER — OXYBUTYNIN CHLORIDE ER 5 MG PO TB24: 5.0000 mg | ORAL_TABLET | Freq: Every day | ORAL | Status: DC

## 2011-03-28 MED ORDER — AMLODIPINE BESYLATE 10 MG PO TABS: 10.0000 mg | ORAL_TABLET | Freq: Every day | ORAL | Status: DC

## 2011-03-28 MED ORDER — FLUTICASONE PROPIONATE 50 MCG/ACT NA SUSP: 2.0000 | Freq: Every day | NASAL | Status: DC

## 2011-03-28 MED ORDER — ALBUTEROL SULFATE HFA 108 (90 BASE) MCG/ACT IN AERS: 2.0000 | INHALATION_SPRAY | Freq: Four times a day (QID) | RESPIRATORY_TRACT | Status: DC | PRN

## 2011-03-28 MED ORDER — FLUCONAZOLE 150 MG PO TABS: ORAL_TABLET | ORAL | Status: DC

## 2011-03-28 MED ORDER — MOXIFLOXACIN HCL 400 MG PO TABS: 400.0000 mg | ORAL_TABLET | Freq: Every day | ORAL | Status: AC

## 2011-03-28 MED ORDER — CEFTRIAXONE SODIUM 1 G IJ SOLR
500.0000 mg | Freq: Once | INTRAMUSCULAR | Status: AC
  Administered 2011-03-28: 500 mg via INTRAMUSCULAR

## 2011-03-28 MED ORDER — BENZONATATE 100 MG PO CAPS: 100.0000 mg | ORAL_CAPSULE | Freq: Four times a day (QID) | ORAL | Status: DC | PRN

## 2011-03-28 NOTE — Assessment & Plan Note (Signed)
Controlled, no change in medication  

## 2011-03-28 NOTE — Patient Instructions (Addendum)
F/U feb 27 or after. You have pneumonia on cXR. Antibiotic is prescribed for 10 days, you need to take all of this. You will get rocephin in the office today also.  You need CBC and dif today  It is vital you get a f/u cXR on feb 25, this has been ordered , you will get the form.  Pls call if you feel worse

## 2011-03-28 NOTE — Assessment & Plan Note (Signed)
Deteriorated. Patient re-educated about  the importance of commitment to a  minimum of 150 minutes of exercise per week. The importance of healthy food choices with portion control discussed. Encouraged to start a food diary, count calories and to consider  joining a support group. Sample diet sheets offered. Goals set by the patient for the next several months.    

## 2011-03-29 ENCOUNTER — Ambulatory Visit (INDEPENDENT_AMBULATORY_CARE_PROVIDER_SITE_OTHER): Payer: No Typology Code available for payment source

## 2011-03-29 VITALS — BP 130/80 | Wt 217.0 lb

## 2011-03-29 DIAGNOSIS — J189 Pneumonia, unspecified organism: Secondary | ICD-10-CM

## 2011-03-29 LAB — CBC WITH DIFFERENTIAL/PLATELET
Basophils Absolute: 0 10*3/uL (ref 0.0–0.1)
Basophils Relative: 0 % (ref 0–1)
Lymphocytes Relative: 11 % — ABNORMAL LOW (ref 12–46)
Neutro Abs: 14.1 10*3/uL — ABNORMAL HIGH (ref 1.7–7.7)
Neutrophils Relative %: 85 % — ABNORMAL HIGH (ref 43–77)
Platelets: 639 10*3/uL — ABNORMAL HIGH (ref 150–400)
RDW: 13.7 % (ref 11.5–15.5)
WBC: 16.5 10*3/uL — ABNORMAL HIGH (ref 4.0–10.5)

## 2011-03-29 MED ORDER — CEFTRIAXONE SODIUM 500 MG IJ SOLR
500.0000 mg | Freq: Once | INTRAMUSCULAR | Status: AC
  Administered 2011-03-29: 500 mg via INTRAMUSCULAR

## 2011-03-30 ENCOUNTER — Ambulatory Visit (INDEPENDENT_AMBULATORY_CARE_PROVIDER_SITE_OTHER): Payer: No Typology Code available for payment source

## 2011-03-30 VITALS — BP 120/80 | Wt 218.0 lb

## 2011-03-30 DIAGNOSIS — J189 Pneumonia, unspecified organism: Secondary | ICD-10-CM

## 2011-03-30 MED ORDER — CEFTRIAXONE SODIUM 500 MG IJ SOLR
500.0000 mg | Freq: Once | INTRAMUSCULAR | Status: AC
  Administered 2011-03-30: 500 mg via INTRAMUSCULAR

## 2011-03-31 ENCOUNTER — Ambulatory Visit (INDEPENDENT_AMBULATORY_CARE_PROVIDER_SITE_OTHER): Payer: No Typology Code available for payment source

## 2011-03-31 DIAGNOSIS — J189 Pneumonia, unspecified organism: Secondary | ICD-10-CM

## 2011-03-31 MED ORDER — CEFTRIAXONE SODIUM 1 G IJ SOLR
500.0000 mg | Freq: Once | INTRAMUSCULAR | Status: AC
  Administered 2011-03-31: 500 mg via INTRAMUSCULAR

## 2011-03-31 NOTE — Progress Notes (Signed)
Pt in for rocephin injection #4 of 5.  Rocephin given in left gluteal. No complications noted.

## 2011-04-01 ENCOUNTER — Observation Stay (HOSPITAL_COMMUNITY): Payer: No Typology Code available for payment source

## 2011-04-01 ENCOUNTER — Encounter (HOSPITAL_COMMUNITY): Payer: Self-pay | Admitting: General Practice

## 2011-04-01 ENCOUNTER — Observation Stay (HOSPITAL_COMMUNITY)
Admission: AD | Admit: 2011-04-01 | Discharge: 2011-04-02 | Disposition: A | Discharge status: Home / Self Care | Payer: No Typology Code available for payment source | Source: Ambulatory Visit | Attending: Internal Medicine | Admitting: Internal Medicine

## 2011-04-01 ENCOUNTER — Ambulatory Visit (INDEPENDENT_AMBULATORY_CARE_PROVIDER_SITE_OTHER): Payer: No Typology Code available for payment source

## 2011-04-01 VITALS — BP 126/68 | Wt 218.1 lb

## 2011-04-01 DIAGNOSIS — K219 Gastro-esophageal reflux disease without esophagitis: Secondary | ICD-10-CM | POA: Insufficient documentation

## 2011-04-01 DIAGNOSIS — Z79899 Other long term (current) drug therapy: Secondary | ICD-10-CM | POA: Insufficient documentation

## 2011-04-01 DIAGNOSIS — J189 Pneumonia, unspecified organism: Principal | ICD-10-CM | POA: Insufficient documentation

## 2011-04-01 DIAGNOSIS — E669 Obesity, unspecified: Secondary | ICD-10-CM

## 2011-04-01 DIAGNOSIS — G4733 Obstructive sleep apnea (adult) (pediatric): Secondary | ICD-10-CM | POA: Insufficient documentation

## 2011-04-01 DIAGNOSIS — J209 Acute bronchitis, unspecified: Secondary | ICD-10-CM

## 2011-04-01 DIAGNOSIS — J019 Acute sinusitis, unspecified: Secondary | ICD-10-CM

## 2011-04-01 DIAGNOSIS — M349 Systemic sclerosis, unspecified: Secondary | ICD-10-CM | POA: Insufficient documentation

## 2011-04-01 DIAGNOSIS — F17201 Nicotine dependence, unspecified, in remission: Secondary | ICD-10-CM

## 2011-04-01 DIAGNOSIS — I10 Essential (primary) hypertension: Secondary | ICD-10-CM | POA: Insufficient documentation

## 2011-04-01 DIAGNOSIS — R5381 Other malaise: Secondary | ICD-10-CM

## 2011-04-01 DIAGNOSIS — M329 Systemic lupus erythematosus, unspecified: Secondary | ICD-10-CM | POA: Insufficient documentation

## 2011-04-01 DIAGNOSIS — H109 Unspecified conjunctivitis: Secondary | ICD-10-CM

## 2011-04-01 DIAGNOSIS — F341 Dysthymic disorder: Secondary | ICD-10-CM | POA: Insufficient documentation

## 2011-04-01 DIAGNOSIS — G4730 Sleep apnea, unspecified: Secondary | ICD-10-CM

## 2011-04-01 DIAGNOSIS — J309 Allergic rhinitis, unspecified: Secondary | ICD-10-CM

## 2011-04-01 DIAGNOSIS — F329 Major depressive disorder, single episode, unspecified: Secondary | ICD-10-CM

## 2011-04-01 LAB — COMPREHENSIVE METABOLIC PANEL
ALT: 8 U/L (ref 0–35)
AST: 9 U/L (ref 0–37)
Alkaline Phosphatase: 71 U/L (ref 39–117)
CO2: 28 mEq/L (ref 19–32)
Calcium: 9 mg/dL (ref 8.4–10.5)
GFR calc Af Amer: >90 mL/min (ref >90)
Glucose, Bld: 110 mg/dL — ABNORMAL HIGH (ref 70–99)
Potassium: 3.5 mEq/L (ref 3.5–5.1)
Sodium: 138 mEq/L (ref 135–145)
Total Protein: 6.8 g/dL (ref 6.0–8.3)

## 2011-04-01 LAB — CBC WITH DIFFERENTIAL/PLATELET
Basophils Relative: 0 % (ref 0–1)
Hemoglobin: 11.8 g/dL — ABNORMAL LOW (ref 12.0–15.0)
Lymphs Abs: 1 10*3/uL (ref 0.7–4.0)
Monocytes Relative: 3 % (ref 3–12)
Neutro Abs: 16.1 10*3/uL — ABNORMAL HIGH (ref 1.7–7.7)
Neutrophils Relative %: 90 % — ABNORMAL HIGH (ref 43–77)
Platelets: 595 10*3/uL — ABNORMAL HIGH (ref 150–400)
RBC: 3.98 MIL/uL (ref 3.87–5.11)

## 2011-04-01 LAB — CBC
HCT: 33.2 % — ABNORMAL LOW (ref 36.0–46.0)
Hemoglobin: 11.1 g/dL — ABNORMAL LOW (ref 12.0–15.0)
MCH: 30.2 pg (ref 26.0–34.0)
MCHC: 33.4 g/dL (ref 30.0–36.0)
RDW: 13.6 % (ref 11.5–15.5)

## 2011-04-01 LAB — DIFFERENTIAL
Basophils Relative: 0 % (ref 0–1)
Eosinophils Absolute: 0.2 10*3/uL (ref 0.0–0.7)
Eosinophils Relative: 2 % (ref 0–5)
Lymphs Abs: 1.8 10*3/uL (ref 0.7–4.0)
Neutrophils Relative %: 78 % — ABNORMAL HIGH (ref 43–77)

## 2011-04-01 MED ORDER — ALUM & MAG HYDROXIDE-SIMETH 200-200-20 MG/5ML PO SUSP: 30.0000 mL | Freq: Four times a day (QID) | ORAL | Status: DC | PRN

## 2011-04-01 MED ORDER — ACETAMINOPHEN 650 MG RE SUPP: 650.0000 mg | Freq: Four times a day (QID) | RECTAL | Status: DC | PRN

## 2011-04-01 MED ORDER — AMLODIPINE BESYLATE 5 MG PO TABS
10.0000 mg | ORAL_TABLET | Freq: Every day | ORAL | Status: DC
  Administered 2011-04-01 – 2011-04-02 (×2): 10 mg via ORAL
  Filled 2011-04-01 (×2): qty 2

## 2011-04-01 MED ORDER — CEFTAROLINE FOSAMIL 600 MG IV SOLR
600.0000 mg | Freq: Two times a day (BID) | INTRAVENOUS | Status: DC
  Administered 2011-04-01 – 2011-04-02 (×3): 600 mg via INTRAVENOUS
  Filled 2011-04-01 (×7): qty 600

## 2011-04-01 MED ORDER — PANTOPRAZOLE SODIUM 40 MG PO TBEC
40.0000 mg | DELAYED_RELEASE_TABLET | Freq: Every day | ORAL | Status: DC
  Administered 2011-04-01 – 2011-04-02 (×2): 40 mg via ORAL
  Filled 2011-04-01 (×2): qty 1

## 2011-04-01 MED ORDER — OXYBUTYNIN CHLORIDE ER 5 MG PO TB24
5.0000 mg | ORAL_TABLET | Freq: Every day | ORAL | Status: DC
  Administered 2011-04-01 – 2011-04-02 (×2): 5 mg via ORAL
  Filled 2011-04-01 (×2): qty 1

## 2011-04-01 MED ORDER — SODIUM CHLORIDE 0.9 % IJ SOLN
3.0000 mL | Freq: Two times a day (BID) | INTRAMUSCULAR | Status: DC
  Administered 2011-04-01 – 2011-04-02 (×3): 3 mL via INTRAVENOUS
  Filled 2011-04-01 (×2): qty 3

## 2011-04-01 MED ORDER — ALBUTEROL SULFATE (5 MG/ML) 0.5% IN NEBU: 2.5000 mg | INHALATION_SOLUTION | RESPIRATORY_TRACT | Status: DC | PRN

## 2011-04-01 MED ORDER — ACETAMINOPHEN 325 MG PO TABS: 650.0000 mg | ORAL_TABLET | Freq: Four times a day (QID) | ORAL | Status: DC | PRN

## 2011-04-01 MED ORDER — SODIUM CHLORIDE 0.9 % IJ SOLN
3.0000 mL | INTRAMUSCULAR | Status: DC | PRN
  Administered 2011-04-01: 3 mL via INTRAVENOUS
  Filled 2011-04-01: qty 3

## 2011-04-01 MED ORDER — CEFTRIAXONE SODIUM 1 G IJ SOLR
500.0000 mg | Freq: Once | INTRAMUSCULAR | Status: AC
  Administered 2011-04-01: 500 mg via INTRAMUSCULAR

## 2011-04-01 MED ORDER — AZITHROMYCIN 250 MG PO TABS
250.0000 mg | ORAL_TABLET | Freq: Every day | ORAL | Status: DC
  Administered 2011-04-02: 250 mg via ORAL
  Filled 2011-04-01: qty 1

## 2011-04-01 MED ORDER — AZITHROMYCIN 250 MG PO TABS
500.0000 mg | ORAL_TABLET | Freq: Every day | ORAL | Status: AC
Start: 2011-04-01 — End: 2011-04-01
  Administered 2011-04-01: 500 mg via ORAL
  Filled 2011-04-01: qty 2

## 2011-04-01 MED ORDER — FLUTICASONE PROPIONATE 50 MCG/ACT NA SUSP
2.0000 | Freq: Every day | NASAL | Status: DC
  Administered 2011-04-01 – 2011-04-02 (×2): 2 via NASAL
  Filled 2011-04-01: qty 16

## 2011-04-01 MED ORDER — MAGNESIUM HYDROXIDE 400 MG/5ML PO SUSP: 30.0000 mL | Freq: Every day | ORAL | Status: DC | PRN

## 2011-04-01 MED ORDER — DM-GUAIFENESIN ER 30-600 MG PO TB12
1.0000 | ORAL_TABLET | Freq: Two times a day (BID) | ORAL | Status: DC
  Administered 2011-04-01 – 2011-04-02 (×3): 1 via ORAL
  Filled 2011-04-01 (×7): qty 1

## 2011-04-01 MED ORDER — TRAZODONE HCL 50 MG PO TABS
50.0000 mg | ORAL_TABLET | Freq: Every evening | ORAL | Status: DC | PRN
  Administered 2011-04-01: 50 mg via ORAL
  Filled 2011-04-01: qty 1

## 2011-04-01 MED ORDER — HYDROCODONE-HOMATROPINE 5-1.5 MG/5ML PO SYRP
5.0000 mL | ORAL_SOLUTION | Freq: Four times a day (QID) | ORAL | Status: DC | PRN
  Administered 2011-04-02: 5 mL via ORAL
  Filled 2011-04-01: qty 5

## 2011-04-01 MED ORDER — SODIUM CHLORIDE 0.9 % IV SOLN: 250.0000 mL | INTRAVENOUS | Status: DC | PRN

## 2011-04-01 MED ORDER — PROMETHAZINE HCL 25 MG/ML IJ SOLN: 12.5000 mg | Freq: Four times a day (QID) | INTRAMUSCULAR | Status: DC | PRN

## 2011-04-01 MED ORDER — PROMETHAZINE HCL 12.5 MG PO TABS
12.5000 mg | ORAL_TABLET | Freq: Four times a day (QID) | ORAL | Status: DC | PRN
  Administered 2011-04-01: 12.5 mg via ORAL
  Filled 2011-04-01: qty 1

## 2011-04-01 NOTE — Progress Notes (Signed)
Pt in for injection of rocephin.  Rocephin given in right gluteal. No complications noted

## 2011-04-01 NOTE — Assessment & Plan Note (Signed)
Radiologic evidence of pneumonia supported by elevated WBC with a shift, aggressive out pt antibiotics with close follow up

## 2011-04-01 NOTE — Progress Notes (Signed)
  Subjective:    Patient ID: Jenna Woods, female    DOB: [birth year], 51 y.o.   MRN: [id number]  HPI Pt in for f/u and states she continues to have chest congestion ,the sputum when she does have any is clear. She denies significant sinus pressure . She has no other significant concerns, just a bit frustrated with her weight but intends to work on this. Of note, pt should have had a cXR in the fall but did not, at that time she also had a cough   Review of Systems See HPI Denies chest congestion, productive cough or wheezing. Denies chest pains, palpitations and leg swelling Denies abdominal pain, nausea, vomiting,diarrhea or constipation.   Denies dysuria, frequency, hesitancy or incontinence. Denies joint pain, swelling and limitation in mobility. Denies headaches, seizures, numbness, or tingling. Denies depression, anxiety or insomnia. Denies skin break down or rash.        Objective:   Physical Exam Patient alert and oriented and in no cardiopulmonary distress.  HEENT: No facial asymmetry, EOMI, no sinus tenderness,  oropharynx pink and moist.  Neck supple no adenopathy.  Chest: decreased though adequate air entry, scattered crackles, no wheezes  CVS: S1, S2 no murmurs, no S3.  ABD: Soft non tender. Bowel sounds normal.  Ext: No edema  MS: Adequate ROM spine, shoulders, hips and knees.  Skin: Intact, no ulcerations or rash noted.  Psych: Good eye contact, normal affect. Memory intact not anxious or depressed appearing.  CNS: CN 2-12 intact, power, tone and sensation normal throughout.        Assessment & Plan:

## 2011-04-01 NOTE — H&P (Signed)
Hospital Admission Note Date: 04/01/2011  Patient name: Jenna Woods Medical record number: [id number] Date of birth: [birth year] Age: 51 y.o. Gender: female PCP: Syliva Overman, MD, MD  Attending physician: Christiane Ha, MD  Chief Complaint: Pneumonia.  History of Present Illness:  Jenna Woods is an 51 y.o. female who was directly admitted from Dr. Anthony Sar office with bilateral community acquired pneumonia. She has been treated as an outpatient for the past 4 days or so with oral levofloxacin and intramuscular Rocephin injections. Her white blood cell count has been hovering about 16,000 to 17,000. Dr. Lodema Hong is concerned about lack of improvement in her white count and therefore requests inpatient management for IV antibiotics. The patient reports that her cough started about a month ago. She had no other symptoms initially. However, about a week ago she began feeling poorly with subjective fevers chills myalgias rhinorrhea worsening cough productive of clear sputum. She thought she might have the flu. Since then, she feels a bit better but still has a cough and rhinorrhea.  Past Medical History  Diagnosis Date  . Hypertension 1996  . Depression with anxiety   . GERD (gastroesophageal reflux disease)   . Scleroderma 1998  . Obesity   . Systemic lupus erythematosus 1998    Treated at [facility]  . Tobacco abuse, in remission     10-pack-years discontinued in 2007  . Obstructive sleep apnea     Mild    Meds: Prescriptions prior to admission  Medication Sig Dispense Refill  . albuterol (PROVENTIL HFA;VENTOLIN HFA) 108 (90 BASE) MCG/ACT inhaler Inhale 2 puffs into the lungs every 6 (six) hours as needed for wheezing.  18 g  0  . amLODipine (NORVASC) 10 MG tablet Take 1 tablet (10 mg total) by mouth daily.  30 tablet  3  . benzonatate (TESSALON PERLES) 100 MG capsule Take 1 capsule (100 mg total) by mouth every 6 (six) hours as needed for cough.  30 capsule  0  .  cetirizine (ZYRTEC) 10 MG tablet Take 10 mg by mouth daily.      Marland Kitchen esomeprazole (NEXIUM) 40 MG capsule TAKE ONE CAPSULE DAILY BEFORE BREAKFAST.  30 capsule  4  . fluticasone (FLONASE) 50 MCG/ACT nasal spray Place 2 sprays into the nose daily.  16 g  2  . moxifloxacin (AVELOX) 400 MG tablet Take 1 tablet (400 mg total) by mouth daily.  10 tablet  0  . oxybutynin (DITROPAN-XL) 5 MG 24 hr tablet Take 1 tablet (5 mg total) by mouth daily.  30 tablet  4    Allergies: Bee; Codeine; and Sesame oil History   Social History  . Marital Status: Single    Spouse Name: N/A    Number of Children: N/A  . Years of Education: N/A   Occupational History  . 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 Smoking status: Former Games developer  . Smokeless tobacco: Not on file  . Alcohol Use: Yes     quit in 2004  . Drug Use: Yes     use to use pot   . Sexually Active: Not on file   Other Topics Concern  . Not on file   Social History Narrative  . No narrative on file   Family History  Problem Relation Age of Onset  . Arthritis Mother     Rheumatoid  . Dementia Mother   . Hypertension Mother   .  Cirrhosis Father   . Alcohol abuse Father   . Cancer Brother     lymph node   . Heart disease Brother    Past Surgical History  Procedure Date  . Finger amputation     Third finger, bilateral, distal  . Abdominal hysterectomy 1990    Neoplasm  . Cesarean section     x2  . Vascular surgery     Hands, bilaterally, [facility]  . Colonoscopy 2005    Review of Systems: Systems reviewed and as per HPI, otherwise negative.  Physical Exam: There were no vitals taken for this visit. There were no vitals taken for this visit.  General Appearance:    Alert, cooperative, no distress, appears stated age. Noted to be walking around the nursing unit without any difficulty.   Head:    Normocephalic, without obvious abnormality, atraumatic    Eyes:    PERRL, conjunctiva/corneas clear, EOM's intact, fundi    benign, both eyes  Ears:    Normal TM's and external ear canals, both ears  Nose:   Nares normal, septum midline, mucosa normal, no drainage    or sinus tenderness  Throat:   Lips, mucosa, and tongue normal; teeth and gums normal. Voice is hoarse   Neck:   supple. Shotty lymphadenopathy submandibular area   Back:     Symmetric, no curvature, ROM normal, no CVA tenderness  Lungs:     Clear to auscultation bilaterally, respirations unlabored  Chest Wall:    No tenderness or deformity   Heart:    Regular rate and rhythm, S1 and S2 normal, no murmur, rub   or gallop     Abdomen:     Soft, non-tender, bowel sounds active all four quadrants,    no masses, no organomegaly        Extremities:   Extremities normal, atraumatic, no cyanosis or edema  Pulses:   2+ and symmetric all extremities  Skin:   Skin color, texture, turgor normal, no rashes or lesions  Lymph nodes:   Cervical, supraclavicular, and axillary nodes normal  Neurologic:   CNII-XII intact, normal strength, sensation and reflexes    throughout    Psychiatric: Normal affect.   Imaging results:  No results found.  Assessment & Plan: Principal Problem:  *Pneumonia Active Problems:  Hypertension  GERD (gastroesophageal reflux disease)  Will repeat labs and PA and lateral chest x-ray. Change antibiotics to Ceftin fairly and azithromycin. Check urine for Legionella and strep pneumonia antigen. Clinically the patient appears stable. Continue other outpatient medications.  Christiane Ha 04/01/2011, 3:28 PM

## 2011-04-02 LAB — STREP PNEUMONIAE URINARY ANTIGEN: Strep Pneumo Urinary Antigen: NEGATIVE

## 2011-04-02 MED ORDER — HYDROCODONE-HOMATROPINE 5-1.5 MG/5ML PO SYRP: 5.0000 mL | ORAL_SOLUTION | Freq: Four times a day (QID) | ORAL | Status: AC | PRN

## 2011-04-02 NOTE — Discharge Summary (Signed)
Physician Discharge Summary  Patient ID: Jenna Woods MRN: [id number] DOB/AGE: 1961-02-28 51 y.o.  Admit date: 04/01/2011 Discharge date: 04/02/2011  Discharge Diagnoses:  Principal Problem:  *Pneumonia Active Problems:  Hypertension  GERD (gastroesophageal reflux disease)   Medication List  As of 04/02/2011  9:59 AM   TAKE these medications         acetaminophen 500 MG tablet   Commonly known as: TYLENOL   Take 1,000 mg by mouth every 6 (six) hours as needed. Pain      albuterol 108 (90 BASE) MCG/ACT inhaler   Commonly known as: PROVENTIL HFA;VENTOLIN HFA   Inhale 2 puffs into the lungs every 6 (six) hours as needed for wheezing.      amLODipine 10 MG tablet   Commonly known as: NORVASC   Take 1 tablet (10 mg total) by mouth daily.      benzonatate 100 MG capsule   Commonly known as: TESSALON   Take 1 capsule (100 mg total) by mouth every 6 (six) hours as needed for cough.      cetirizine 10 MG tablet   Commonly known as: ZYRTEC   Take 10 mg by mouth daily.      esomeprazole 40 MG capsule   Commonly known as: NEXIUM   Take 40 mg by mouth daily before breakfast.      fluticasone 50 MCG/ACT nasal spray   Commonly known as: FLONASE   Place 2 sprays into the nose daily.      Hair/Skin/Nails Tabs   Take 1 tablet by mouth daily.      HYDROcodone-homatropine 5-1.5 MG/5ML syrup   Commonly known as: HYCODAN   Take 5 mLs by mouth every 6 (six) hours as needed for cough.      moxifloxacin 400 MG tablet   Commonly known as: AVELOX   Take 400 mg by mouth daily.      oxybutynin 5 MG 24 hr tablet   Commonly known as: DITROPAN-XL   Take 1 tablet (5 mg total) by mouth daily.            Discharge Orders    Future Appointments: Provider: Department: Dept Phone: Center:   05/09/2011 2:30 PM Syliva Overman, MD [facility] [phone] [facility]     Future Orders Please Complete By Expires   Diet - low sodium heart healthy      Activity as tolerated - No  restrictions         Follow-up Information    Follow up with Syliva Overman, MD. (If symptoms worsen)    Contact information:   [address] [place] [address] [phone]          Disposition: home  Discharged Condition: stable  Consults:  none  Labs:   Results for orders placed during the hospital encounter of 04/01/11 (from the past 48 hour(s))  COMPREHENSIVE METABOLIC PANEL     Status: Abnormal   Collection Time   04/01/11  3:58 PM      Component Value Range Comment   Sodium 138  135 - 145 (mEq/L)    Potassium 3.5  3.5 - 5.1 (mEq/L)    Chloride 102  96 - 112 (mEq/L)    CO2 28  19 - 32 (mEq/L)    Glucose, Bld 110 (*) 70 - 99 (mg/dL)    BUN 11  6 - 23 (mg/dL)    Creatinine, Ser 7.84  0.50 - 1.10 (mg/dL)    Calcium 9.0  8.4 - 10.5 (mg/dL)    Total Protein 6.8  6.0 - 8.3 (g/dL)    Albumin 3.1 (*) 3.5 - 5.2 (g/dL)    AST 9  0 - 37 (U/L)    ALT 8  0 - 35 (U/L)    Alkaline Phosphatase 71  39 - 117 (U/L)    Total Bilirubin 0.1 (*) 0.3 - 1.2 (mg/dL)    GFR calc non Af Amer >90  >90 (mL/min)    GFR calc Af Amer >90  >90 (mL/min)   CBC     Status: Abnormal   Collection Time   04/01/11  3:58 PM      Component Value Range Comment   WBC 10.7 (*) 4.0 - 10.5 (K/uL)    RBC 3.68 (*) 3.87 - 5.11 (MIL/uL)    Hemoglobin 11.1 (*) 12.0 - 15.0 (g/dL)    HCT 40.9 (*) 81.1 - 46.0 (%)    MCV 90.2  78.0 - 100.0 (fL)    MCH 30.2  26.0 - 34.0 (pg)    MCHC 33.4  30.0 - 36.0 (g/dL)    RDW 91.4  78.2 - 95.6 (%)    Platelets 539 (*) 150 - 400 (K/uL)   DIFFERENTIAL     Status: Abnormal   Collection Time   04/01/11  3:58 PM      Component Value Range Comment   Neutrophils Relative 78 (*) 43 - 77 (%)    Neutro Abs 8.3 (*) 1.7 - 7.7 (K/uL)    Lymphocytes Relative 17  12 - 46 (%)    Lymphs Abs 1.8  0.7 - 4.0 (K/uL)    Monocytes Relative 3  3 - 12 (%)    Monocytes Absolute 0.3  0.1 - 1.0 (K/uL)    Eosinophils Relative 2  0 - 5 (%)    Eosinophils Absolute 0.2   0.0 - 0.7 (K/uL)    Basophils Relative 0  0 - 1 (%)    Basophils Absolute 0.0  0.0 - 0.1 (K/uL)     Diagnostics:  Dg Chest 2 View  04/01/2011  *RADIOLOGY REPORT*  Clinical Data: History of pneumonia.  Follow-up.  CHEST - 2 VIEW  Comparison: 03/28/2011.  Findings: Cardiac silhouette is upper normal size.  Aortic tortuosity is stable.  The haziness in the right midlung zone is no longer evident.  There is decrease in the area of increased markings in the left lung base.  No consolidation, pleural effusion, or new lesion is evident.  There is slightly osteopenic appearance of bones.  IMPRESSION: Haziness in right midlung zone is no longer evident.  In the area of left basilar infiltrative density on prior study there is decreased  density with only a few residual increased markings.  No consolidation or new lesion is evident.  Original Report Authenticated By: Crawford Givens, M.D.   Dg Chest 2 View  03/28/2011  *RADIOLOGY REPORT*  Clinical Data: Cough, hypertension, acute bronchitis  CHEST - 2 VIEW  Comparison: 08/20/2009  Findings: Upper-normal size of cardiac silhouette. Tortuous aorta. Pulmonary vascularity normal. Hazy right mid lung and lingular infiltrates consistent with pneumonia. No pleural effusion or pneumothorax. Bones demineralized.  IMPRESSION: Bilateral pulmonary infiltrates consistent with pneumonia.  Original Report Authenticated By: Lollie Marrow, M.D.    Hospital Course: See H&P for complete admission details. The patient is a 51 year old female with pneumonia. She was directly admitted from Dr. Anthony Sar office. She had been on Avelox and Rocephin IM daily for several days for community-acquired pneumonia. She  had a white blood cell count of 16,000. Her white count was staying high, so Dr. Lodema Hong felt patient would benefit from admission for IV antibiotics. Patient has had no fevers or chills. She has had no shortness of breath or wheezing. She has had a productive cough. Her appetite has  been good. Here, her repeat chest x-ray shows improvement in her bilateral pneumonia. Also, her leukocytosis has resolved. She has been able to ambulate without difficulty. She has clear lung sounds. She feels well and is stable for discharge. She can resume Avelox but does not need the Rocephin, as it does not add anything to coverage for community-acquired pneumonia. I have given her a prescription for Hycodan, as hacking cough at night is her main complaint. She's not to drive while taking this. She does have Tessalon Perles at home which she may take during the day.  Discharge Exam:  Blood pressure 112/74, pulse 86, temperature 98 F (36.7 C), temperature source Oral, resp. rate 18, height 5\' 6"  (1.676 m), weight 99.383 kg (219 lb 1.6 oz), SpO2 92.00%.  Exam unchanged from 04/01/2011   Signed: Crista Curb L 04/02/2011, 9:59 AM

## 2011-04-02 NOTE — Progress Notes (Signed)
Patient to be discharged, discharge instructions provided to patient - patient verbalized understanding, iv site removed, no voiced complaints or questions at this time, patient left  floor via ambulation to personal vehicle

## 2011-04-03 LAB — LEGIONELLA ANTIGEN, URINE

## 2011-04-22 ENCOUNTER — Encounter (HOSPITAL_COMMUNITY): Payer: Self-pay | Admitting: *Deleted

## 2011-04-22 ENCOUNTER — Emergency Department (HOSPITAL_COMMUNITY)
Admission: EM | Admit: 2011-04-22 | Discharge: 2011-04-22 | Disposition: A | Discharge status: Home / Self Care | Payer: No Typology Code available for payment source | Attending: Emergency Medicine | Admitting: Emergency Medicine

## 2011-04-22 DIAGNOSIS — M329 Systemic lupus erythematosus, unspecified: Secondary | ICD-10-CM | POA: Insufficient documentation

## 2011-04-22 DIAGNOSIS — T7840XA Allergy, unspecified, initial encounter: Secondary | ICD-10-CM

## 2011-04-22 DIAGNOSIS — M349 Systemic sclerosis, unspecified: Secondary | ICD-10-CM | POA: Insufficient documentation

## 2011-04-22 DIAGNOSIS — S68118A Complete traumatic metacarpophalangeal amputation of other finger, initial encounter: Secondary | ICD-10-CM | POA: Insufficient documentation

## 2011-04-22 DIAGNOSIS — I10 Essential (primary) hypertension: Secondary | ICD-10-CM | POA: Insufficient documentation

## 2011-04-22 DIAGNOSIS — K219 Gastro-esophageal reflux disease without esophagitis: Secondary | ICD-10-CM | POA: Insufficient documentation

## 2011-04-22 DIAGNOSIS — Z9079 Acquired absence of other genital organ(s): Secondary | ICD-10-CM | POA: Insufficient documentation

## 2011-04-22 DIAGNOSIS — Z87891 Personal history of nicotine dependence: Secondary | ICD-10-CM | POA: Insufficient documentation

## 2011-04-22 DIAGNOSIS — F341 Dysthymic disorder: Secondary | ICD-10-CM | POA: Insufficient documentation

## 2011-04-22 DIAGNOSIS — L500 Allergic urticaria: Secondary | ICD-10-CM | POA: Insufficient documentation

## 2011-04-22 HISTORY — DX: Allergy, unspecified, initial encounter: T78.40XA

## 2011-04-22 MED ORDER — DIPHENHYDRAMINE HCL 25 MG PO CAPS
50.0000 mg | ORAL_CAPSULE | Freq: Once | ORAL | Status: AC
  Administered 2011-04-22: 50 mg via ORAL
  Filled 2011-04-22: qty 2

## 2011-04-22 MED ORDER — DEXAMETHASONE SODIUM PHOSPHATE 4 MG/ML IJ SOLN
10.0000 mg | Freq: Once | INTRAMUSCULAR | Status: AC
  Administered 2011-04-22: 10 mg via INTRAMUSCULAR
  Filled 2011-04-22: qty 3

## 2011-04-22 MED ORDER — DEXAMETHASONE SODIUM PHOSPHATE 4 MG/ML IJ SOLN: 10.0000 mg | Freq: Once | INTRAMUSCULAR | Status: DC

## 2011-04-22 MED ORDER — FAMOTIDINE 20 MG PO TABS
20.0000 mg | ORAL_TABLET | Freq: Once | ORAL | Status: AC
  Administered 2011-04-22: 20 mg via ORAL
  Filled 2011-04-22: qty 1

## 2011-04-22 NOTE — ED Notes (Signed)
See triage note.

## 2011-04-22 NOTE — Discharge Instructions (Signed)

## 2011-04-22 NOTE — ED Provider Notes (Signed)
History     CSN: [id number]  Arrival date & time 04/22/11  1448   First MD Initiated Contact with Patient 04/22/11 1453      Chief Complaint  Patient presents with  . Rash    (Consider location/radiation/quality/duration/timing/severity/associated sxs/prior treatment) Patient is a 51 y.o. female presenting with rash. The history is provided by the patient. No language interpreter was used.  Rash  This is a new problem. The current episode started 1 to 2 hours ago. The problem has not changed since onset.Associated with: pt ate a small bag of cheetos.  she thinks they had sesame oil in them which she skows she is allergic to.  she has broken out in hives primarily on her arms.  no diff breathing or swallowing. The patient is experiencing no pain. She has tried nothing for the symptoms.    Past Medical History  Diagnosis Date  . Hypertension 1996  . Depression with anxiety   . GERD (gastroesophageal reflux disease)   . Scleroderma 1998  . Obesity   . Systemic lupus erythematosus 1998    Treated at [facility]  . Tobacco abuse, in remission     10-pack-years discontinued in 2007  . Depression   . Anxiety   . Allergic reaction     Past Surgical History  Procedure Date  . Finger amputation     Third finger, bilateral, distal  . Abdominal hysterectomy 1990    Neoplasm  . Cesarean section     x2  . Vascular surgery     Hands, bilaterally, [facility]  . Colonoscopy 2005    Family History  Problem Relation Age of Onset  . Arthritis Mother     Rheumatoid  . Dementia Mother   . Hypertension Mother   . Cirrhosis Father   . Alcohol abuse Father   . Cancer Brother     lymph node   . Heart disease Brother     History  Substance Use Topics  . Smoking status: Former Games developer  . Smokeless tobacco: Not on file  . Alcohol Use: No     quit in 2004    OB History    Grav Para Term Preterm Abortions TAB SAB Ect Mult Living                  Review of Systems  Skin:  Positive for rash.  All other systems reviewed and are negative.    Allergies  Sesame oil; Bee; and Codeine  Home Medications   Current Outpatient Rx  Name Route Sig Dispense Refill  . ACETAMINOPHEN 500 MG PO TABS Oral Take 1,000 mg by mouth every 6 (six) hours as needed. Pain    . ALBUTEROL SULFATE HFA 108 (90 BASE) MCG/ACT IN AERS Inhalation Inhale 2 puffs into the lungs every 6 (six) hours as needed for wheezing. 18 g 0  . AMLODIPINE BESYLATE 10 MG PO TABS Oral Take 1 tablet (10 mg total) by mouth daily. 30 tablet 3  . BENZONATATE 100 MG PO CAPS Oral Take 1 capsule (100 mg total) by mouth every 6 (six) hours as needed for cough. 30 capsule 0  . CETIRIZINE HCL 10 MG PO TABS Oral Take 10 mg by mouth daily.    Marland Kitchen ESOMEPRAZOLE MAGNESIUM 40 MG PO CPDR Oral Take 40 mg by mouth daily before breakfast.    . FLUTICASONE PROPIONATE 50 MCG/ACT NA SUSP Nasal Place 2 sprays into the nose daily. 16 g 2  . MOXIFLOXACIN HCL 400 MG  PO TABS Oral Take 400 mg by mouth daily.    Marland Kitchen HAIR/SKIN/NAILS PO TABS Oral Take 1 tablet by mouth daily.    . OXYBUTYNIN CHLORIDE ER 5 MG PO TB24 Oral Take 1 tablet (5 mg total) by mouth daily. 30 tablet 4    Dose change effective 12/29/2010    BP 135/86  Pulse 97  Temp(Src) 97.7 F (36.5 C) (Oral)  Resp 20  Ht 5\' 6"  (1.676 m)  Wt 219 lb (99.338 kg)  BMI 35.35 kg/m2  SpO2 98%  Physical Exam  Nursing note and vitals reviewed. Constitutional: She is oriented to person, place, and time. She appears well-developed and well-nourished. No distress.  HENT:  Head: Normocephalic and atraumatic.  Mouth/Throat: Oropharynx is clear and moist.  Eyes: EOM are normal.  Neck: Normal range of motion.  Cardiovascular: Normal rate, regular rhythm and normal heart sounds.   Pulmonary/Chest: Effort normal and breath sounds normal. No stridor. No respiratory distress. She has no decreased breath sounds. She has no wheezes. She has no rhonchi. She has no rales. She exhibits no  tenderness.  Abdominal: Soft. She exhibits no distension. There is no tenderness.  Musculoskeletal: Normal range of motion.  Neurological: She is alert and oriented to person, place, and time.  Skin: Skin is warm and dry. Rash noted. Rash is urticarial.  Psychiatric: She has a normal mood and affect. Judgment normal.    ED Course  Procedures (including critical care time)  Labs Reviewed - No data to display No results found.   No diagnosis found.    MDM          Worthy Rancher, PA 04/22/11 1525

## 2011-04-22 NOTE — ED Notes (Addendum)
Pt presents to er with c/o rash to bilateral arms,  States that it started suddenly this afternoon prior to arrival, pt denies any new exposures, pt does have swelling to hands,  pt denies any sob, does have hx of allergic reactions, has epi pen prescribed for her. Pt states that she did eat baked cheeto's at school around 10:30 am, thinks that the company may bake the cheetos in sesame oil, pt states that she is allergic to sesome oil, pt denies any swelling to lips or throat area.

## 2011-04-23 NOTE — ED Provider Notes (Signed)
Medical screening examination/treatment/procedure(s) were performed by non-physician practitioner and as supervising physician I was immediately available for consultation/collaboration.   Shelda Jakes, MD 04/23/11 [phone]

## 2011-05-09 ENCOUNTER — Encounter: Payer: Self-pay | Admitting: Family Medicine

## 2011-05-09 ENCOUNTER — Ambulatory Visit: Payer: No Typology Code available for payment source | Admitting: Family Medicine

## 2011-05-10 ENCOUNTER — Encounter: Payer: Self-pay | Admitting: Family Medicine

## 2011-05-10 ENCOUNTER — Ambulatory Visit (INDEPENDENT_AMBULATORY_CARE_PROVIDER_SITE_OTHER): Payer: No Typology Code available for payment source | Admitting: Family Medicine

## 2011-05-10 VITALS — BP 128/80 | HR 97 | Resp 18 | Ht 66.0 in | Wt 218.0 lb

## 2011-05-10 DIAGNOSIS — I10 Essential (primary) hypertension: Secondary | ICD-10-CM

## 2011-05-10 DIAGNOSIS — F411 Generalized anxiety disorder: Secondary | ICD-10-CM

## 2011-05-10 DIAGNOSIS — J309 Allergic rhinitis, unspecified: Secondary | ICD-10-CM

## 2011-05-10 DIAGNOSIS — F419 Anxiety disorder, unspecified: Secondary | ICD-10-CM

## 2011-05-10 DIAGNOSIS — Z0000 Encounter for general adult medical examination without abnormal findings: Secondary | ICD-10-CM

## 2011-05-10 DIAGNOSIS — E669 Obesity, unspecified: Secondary | ICD-10-CM

## 2011-05-10 DIAGNOSIS — Z0189 Encounter for other specified special examinations: Secondary | ICD-10-CM

## 2011-05-10 DIAGNOSIS — K219 Gastro-esophageal reflux disease without esophagitis: Secondary | ICD-10-CM

## 2011-05-10 MED ORDER — LORATADINE 10 MG PO TABS: 10.0000 mg | ORAL_TABLET | Freq: Every day | ORAL | Status: DC

## 2011-05-10 MED ORDER — PHENTERMINE HCL 37.5 MG PO TABS: 37.5000 mg | ORAL_TABLET | Freq: Every day | ORAL | Status: DC

## 2011-05-10 MED ORDER — ALPRAZOLAM 1 MG PO TABS: 1.0000 mg | ORAL_TABLET | Freq: Three times a day (TID) | ORAL | Status: DC

## 2011-05-10 NOTE — Patient Instructions (Signed)
F/u in 4 month  You can resume xanax as before, you will get the script today.  Please break the phentermine in HALF and take half daily to curb appetite   Cbc, fasting lipid, chem 7, HBA1c, and TSH in 4 months  It is important that you exercise regularly at least 30 minutes 5 times a week. If you develop chest pain, have severe difficulty breathing, or feel very tired, stop exercising immediately and seek medical attention    A healthy diet is rich in fruit, vegetables and whole grains. Poultry fish, nuts and beans are a healthy choice for protein rather then red meat. A low sodium diet and drinking 64 ounces of water daily is generally recommended. Oils and sweet should be limited. Carbohydrates especially for those who are diabetic or overweight, should be limited to 30-45 gram per meal. It is important to eat on a regular schedule, at least 3 times daily. Snacks should be primarily fruits, vegetables or nuts.  Weight loss goal of 6 to 8 pounds  Use flonase, and loratidine every day for allergies, flush your nostrils twice daily as needed with salt water flushes , we will  provide the info as to how to make it

## 2011-05-15 NOTE — Assessment & Plan Note (Signed)
Controlled, no change in medication  

## 2011-05-15 NOTE — Assessment & Plan Note (Signed)
Deteriorated. Patient re-educated about  the importance of commitment to a  minimum of 150 minutes of exercise per week. The importance of healthy food choices with portion control discussed. Encouraged to start a food diary, count calories and to consider  joining a support group. Sample diet sheets offered. Goals set by the patient for the next several months.   Pt toresume phentermine  

## 2011-05-15 NOTE — Assessment & Plan Note (Signed)
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

## 2011-05-15 NOTE — Progress Notes (Signed)
  Subjective:    Patient ID: Jenna Woods, female    DOB: Aug 04, 1960, 51 y.o.   MRN: [id number]  HPI The PT is here for follow up and re-evaluation of chronic medical conditions, medication management and review of any available recent lab and radiology data.  Preventive health is updated, specifically  Cancer screening and Immunization.   Questions or concerns regarding consultations or procedures which the PT has had in the interim are  Addressed.Recently hospitalized with pneumonia and symptoms have totally resolved The PT denies any adverse reactions to current medications since the last visit.  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 Denies sinus pressure, nasal congestion, ear pain or sore throat.Increased allergy symptoms in the past week which are expected to worsen as Spring comes in Denies chest congestion, productive cough or wheezing. Denies chest pains, palpitations and leg swelling Denies abdominal pain, nausea, vomiting,diarrhea or constipation.   Denies dysuria, frequency, hesitancy or incontinence. Denies joint pain, swelling and limitation in mobility. Denies headaches, seizures, numbness, or tingling. Denies skin break down or rash.        Objective:   Physical Exam Patient alert and oriented and in no cardiopulmonary distress.  HEENT: No facial asymmetry, EOMI, no sinus tenderness,  oropharynx pink and moist.  Neck supple no adenopathy.  Chest: Clear to auscultation bilaterally.  CVS: S1, S2 no murmurs, no S3.  ABD: Soft non tender. Bowel sounds normal.  Ext: No edema  MS: Adequate ROM spine, shoulders, hips and knees.  Skin: Intact, no ulcerations or rash noted.  Psych: Good eye contact, normal affect. Memory intact  anxious  appearing.  CNS: CN 2-12 intact, power, tone  and sensation normal throughout.        Assessment & Plan:

## 2011-05-15 NOTE — Assessment & Plan Note (Signed)
Increased and uncontrolled symptoms, start claritin

## 2011-07-07 ENCOUNTER — Other Ambulatory Visit: Payer: Self-pay | Admitting: Family Medicine

## 2011-07-18 ENCOUNTER — Other Ambulatory Visit: Payer: Self-pay | Admitting: Family Medicine

## 2011-07-18 ENCOUNTER — Telehealth: Payer: Self-pay | Admitting: Family Medicine

## 2011-07-18 DIAGNOSIS — J329 Chronic sinusitis, unspecified: Secondary | ICD-10-CM

## 2011-07-18 NOTE — Telephone Encounter (Signed)
pls refer to Dr Suszanne Conners eval and treat chronic sinusitis, let her know

## 2011-07-19 NOTE — Telephone Encounter (Signed)
Pt was referred to dr. Suszanne Conners and has appt in June. Pt is aware

## 2011-08-05 ENCOUNTER — Other Ambulatory Visit: Payer: Self-pay | Admitting: Family Medicine

## 2011-08-18 ENCOUNTER — Ambulatory Visit (INDEPENDENT_AMBULATORY_CARE_PROVIDER_SITE_OTHER): Payer: No Typology Code available for payment source | Admitting: Otolaryngology

## 2011-08-18 DIAGNOSIS — H6120 Impacted cerumen, unspecified ear: Secondary | ICD-10-CM

## 2011-08-18 DIAGNOSIS — H903 Sensorineural hearing loss, bilateral: Secondary | ICD-10-CM

## 2011-08-19 ENCOUNTER — Other Ambulatory Visit (INDEPENDENT_AMBULATORY_CARE_PROVIDER_SITE_OTHER): Payer: Self-pay | Admitting: Otolaryngology

## 2011-08-19 DIAGNOSIS — J310 Chronic rhinitis: Secondary | ICD-10-CM

## 2011-08-23 ENCOUNTER — Ambulatory Visit (HOSPITAL_COMMUNITY): Payer: No Typology Code available for payment source

## 2011-08-23 ENCOUNTER — Other Ambulatory Visit (INDEPENDENT_AMBULATORY_CARE_PROVIDER_SITE_OTHER): Payer: Self-pay | Admitting: Otolaryngology

## 2011-08-26 ENCOUNTER — Ambulatory Visit (HOSPITAL_COMMUNITY)
Admission: RE | Admit: 2011-08-26 | Discharge: 2011-08-26 | Disposition: A | Discharge status: Home / Self Care | Payer: No Typology Code available for payment source | Source: Ambulatory Visit | Attending: Otolaryngology | Admitting: Otolaryngology

## 2011-08-26 ENCOUNTER — Other Ambulatory Visit (INDEPENDENT_AMBULATORY_CARE_PROVIDER_SITE_OTHER): Payer: Self-pay | Admitting: Otolaryngology

## 2011-08-26 ENCOUNTER — Encounter (HOSPITAL_COMMUNITY): Payer: Self-pay

## 2011-08-26 DIAGNOSIS — J3489 Other specified disorders of nose and nasal sinuses: Secondary | ICD-10-CM | POA: Insufficient documentation

## 2011-08-26 DIAGNOSIS — G9601 Cranial cerebrospinal fluid leak, spontaneous: Secondary | ICD-10-CM | POA: Insufficient documentation

## 2011-08-26 MED ORDER — ACETAMINOPHEN 325 MG PO TABS
ORAL_TABLET | ORAL | Status: AC
  Filled 2011-08-26: qty 2

## 2011-08-26 MED ORDER — IOHEXOL 180 MG/ML  SOLN
20.0000 mL | Freq: Once | INTRAMUSCULAR | Status: AC | PRN
  Administered 2011-08-26: 20 mL via INTRATHECAL

## 2011-08-26 MED ORDER — ACETAMINOPHEN 325 MG PO TABS
650.0000 mg | ORAL_TABLET | Freq: Four times a day (QID) | ORAL | Status: DC | PRN
  Administered 2011-08-26: 650 mg via ORAL

## 2011-08-26 NOTE — Procedures (Signed)
Lumbar puncture L34 for injection of Omnipaque.  CT to follow.  No complications

## 2011-08-26 NOTE — Discharge Instructions (Signed)
Myelography Care After These instructions give you information on caring for yourself after your procedure. Your doctor may also give you specific instructions. Call your doctor if you have any problems or questions after your procedure. HOME CARE  Lie down for 24 hours. Lie in any position with 1 pillow under your head.   For 24 hours, get up only to eat or use the bathroom. Take only 10 minutes to eat.   For 24 hours, drink enough fluids to keep your pee (urine) clear or pale yellow. No alcohol.   Take all medicine as told by your doctor.   Avoid heavy lifting and activity for 48 hours.   You may take the bandage off the day after your myelography.   Do not take a bath for 24 hours. Ask your doctor if it is okay to take a shower.  Finding out the results of your test Ask your doctor when your test results will be ready. Make sure you follow up and get the test results. GET HELP RIGHT AWAY IF:   Any of the places where the needles were put in:   Are puffy (swollen) or red.   Are sore or hot to the touch.   Are draining yellowish-white fluid (pus).   Are bleeding after 10 minutes of pressing down on the site. Have someone press on any place that is bleeding until seen by a doctor.   You have a lasting headache that is not helped by medicine.   You have a bad headache with a stiff neck or fever.   You have trouble breathing.   You feel sick to your stomach (nauseous) or throw up (vomit).   You have pain or cramping in your belly (abdomen).   You have a fever.  If you go to the emergency room, tell the doctor you had a myelogram. Take this paper with you to show the doctor. MAKE SURE YOU:  Understand these instructions.   Will watch your condition.   Will get help right away if you are not doing well or get worse.  Document Released: 12/01/2007 Document Revised: 02/10/2011 Document Reviewed: 12/01/2007 Exit[employer] Patient Information 2012 [employer]. 

## 2011-09-01 ENCOUNTER — Other Ambulatory Visit: Payer: Self-pay | Admitting: Family Medicine

## 2011-09-01 ENCOUNTER — Ambulatory Visit (INDEPENDENT_AMBULATORY_CARE_PROVIDER_SITE_OTHER): Payer: No Typology Code available for payment source | Admitting: Otolaryngology

## 2011-09-05 ENCOUNTER — Encounter (HOSPITAL_BASED_OUTPATIENT_CLINIC_OR_DEPARTMENT_OTHER)
Admission: RE | Admit: 2011-09-05 | Discharge: 2011-09-05 | Disposition: A | Discharge status: Home / Self Care | Payer: No Typology Code available for payment source | Source: Ambulatory Visit | Attending: Otolaryngology | Admitting: Otolaryngology

## 2011-09-05 ENCOUNTER — Encounter (HOSPITAL_BASED_OUTPATIENT_CLINIC_OR_DEPARTMENT_OTHER): Payer: Self-pay | Admitting: *Deleted

## 2011-09-05 LAB — BASIC METABOLIC PANEL
Chloride: 105 mEq/L (ref 96–112)
Creatinine, Ser: 0.75 mg/dL (ref 0.50–1.10)
GFR calc Af Amer: >90 mL/min (ref >90)
GFR calc non Af Amer: >90 mL/min (ref >90)
Potassium: 4.6 mEq/L (ref 3.5–5.1)

## 2011-09-05 NOTE — Progress Notes (Signed)
To come in for bmet-ekg Has had this leak for 5 months

## 2011-09-06 ENCOUNTER — Encounter (HOSPITAL_BASED_OUTPATIENT_CLINIC_OR_DEPARTMENT_OTHER): Payer: Self-pay | Admitting: Anesthesiology

## 2011-09-06 ENCOUNTER — Ambulatory Visit (HOSPITAL_BASED_OUTPATIENT_CLINIC_OR_DEPARTMENT_OTHER)
Admission: RE | Admit: 2011-09-06 | Discharge: 2011-09-06 | Disposition: A | Discharge status: Home / Self Care | Payer: No Typology Code available for payment source | Source: Ambulatory Visit | Attending: Otolaryngology | Admitting: Otolaryngology

## 2011-09-06 ENCOUNTER — Encounter (HOSPITAL_BASED_OUTPATIENT_CLINIC_OR_DEPARTMENT_OTHER): Payer: Self-pay

## 2011-09-06 ENCOUNTER — Ambulatory Visit (HOSPITAL_BASED_OUTPATIENT_CLINIC_OR_DEPARTMENT_OTHER): Payer: No Typology Code available for payment source | Admitting: Anesthesiology

## 2011-09-06 ENCOUNTER — Encounter (HOSPITAL_BASED_OUTPATIENT_CLINIC_OR_DEPARTMENT_OTHER)
Admission: RE | Disposition: A | Discharge status: Home / Self Care | Payer: Self-pay | Source: Ambulatory Visit | Attending: Otolaryngology

## 2011-09-06 DIAGNOSIS — G9601 Cranial cerebrospinal fluid leak, spontaneous: Secondary | ICD-10-CM | POA: Insufficient documentation

## 2011-09-06 DIAGNOSIS — Z01812 Encounter for preprocedural laboratory examination: Secondary | ICD-10-CM | POA: Insufficient documentation

## 2011-09-06 DIAGNOSIS — G960 Cerebrospinal fluid leak: Secondary | ICD-10-CM

## 2011-09-06 DIAGNOSIS — Z01810 Encounter for preprocedural cardiovascular examination: Secondary | ICD-10-CM | POA: Insufficient documentation

## 2011-09-06 HISTORY — DX: Peripheral vascular disease, unspecified: I73.9

## 2011-09-06 HISTORY — DX: Frequency of micturition: R35.0

## 2011-09-06 HISTORY — PX: NASAL SINUS SURGERY: SHX719

## 2011-09-06 LAB — POCT HEMOGLOBIN-HEMACUE: Hemoglobin: 11.8 g/dL — ABNORMAL LOW (ref 12.0–15.0)

## 2011-09-06 SURGERY — SINUS SURGERY, ENDOSCOPIC
Anesthesia: General | Site: Nose | Wound class: Clean Contaminated

## 2011-09-06 MED ORDER — FENTANYL CITRATE 0.05 MG/ML IJ SOLN
INTRAMUSCULAR | Status: DC | PRN
  Administered 2011-09-06 (×2): 50 ug via INTRAVENOUS

## 2011-09-06 MED ORDER — LIDOCAINE HCL 4 % MT SOLN
OROMUCOSAL | Status: DC | PRN
  Administered 2011-09-06: 4 mL via TOPICAL

## 2011-09-06 MED ORDER — MIDAZOLAM HCL 5 MG/5ML IJ SOLN
INTRAMUSCULAR | Status: DC | PRN
  Administered 2011-09-06: 2 mg via INTRAVENOUS

## 2011-09-06 MED ORDER — DEXTROSE 5 % IV SOLN
1.0000 g | Freq: Once | INTRAVENOUS | Status: DC
  Filled 2011-09-06: qty 10

## 2011-09-06 MED ORDER — LIDOCAINE HCL (CARDIAC) 20 MG/ML IV SOLN
INTRAVENOUS | Status: DC | PRN
  Administered 2011-09-06: 60 mg via INTRAVENOUS

## 2011-09-06 MED ORDER — BACITRACIN ZINC 500 UNIT/GM EX OINT
TOPICAL_OINTMENT | CUTANEOUS | Status: DC | PRN
  Administered 2011-09-06: 1 via TOPICAL

## 2011-09-06 MED ORDER — HYDROCODONE-ACETAMINOPHEN 5-500 MG PO TABS: 1.0000 | ORAL_TABLET | ORAL | Status: AC | PRN

## 2011-09-06 MED ORDER — FLUCONAZOLE 150 MG PO TABS: 150.0000 mg | ORAL_TABLET | Freq: Once | ORAL | Status: DC

## 2011-09-06 MED ORDER — LIDOCAINE-EPINEPHRINE 1 %-1:100000 IJ SOLN
INTRAMUSCULAR | Status: DC | PRN
  Administered 2011-09-06: 3 mL

## 2011-09-06 MED ORDER — PROPOFOL 10 MG/ML IV EMUL
INTRAVENOUS | Status: DC | PRN
  Administered 2011-09-06: 200 mg via INTRAVENOUS

## 2011-09-06 MED ORDER — AMOXICILLIN-POT CLAVULANATE 875-125 MG PO TABS: 1.0000 | ORAL_TABLET | Freq: Two times a day (BID) | ORAL | Status: AC

## 2011-09-06 MED ORDER — OXYCODONE HCL 5 MG PO TABS
5.0000 mg | ORAL_TABLET | Freq: Once | ORAL | Status: AC | PRN
  Administered 2011-09-06: 5 mg via ORAL

## 2011-09-06 MED ORDER — HYDROMORPHONE HCL PF 1 MG/ML IJ SOLN
0.2500 mg | INTRAMUSCULAR | Status: DC | PRN
  Administered 2011-09-06 (×4): 0.5 mg via INTRAVENOUS

## 2011-09-06 MED ORDER — ACETAMINOPHEN 10 MG/ML IV SOLN
1000.0000 mg | Freq: Once | INTRAVENOUS | Status: AC
  Administered 2011-09-06: 1000 mg via INTRAVENOUS

## 2011-09-06 MED ORDER — METOCLOPRAMIDE HCL 5 MG/ML IJ SOLN
INTRAMUSCULAR | Status: DC | PRN
  Administered 2011-09-06: 10 mg via INTRAVENOUS

## 2011-09-06 MED ORDER — SUCCINYLCHOLINE CHLORIDE 20 MG/ML IJ SOLN
INTRAMUSCULAR | Status: DC | PRN
  Administered 2011-09-06: 100 mg via INTRAVENOUS

## 2011-09-06 MED ORDER — ONDANSETRON HCL 4 MG/2ML IJ SOLN
INTRAMUSCULAR | Status: DC | PRN
  Administered 2011-09-06: 4 mg via INTRAVENOUS

## 2011-09-06 MED ORDER — METOCLOPRAMIDE HCL 5 MG/ML IJ SOLN: 10.0000 mg | Freq: Once | INTRAMUSCULAR | Status: DC | PRN

## 2011-09-06 MED ORDER — OXYMETAZOLINE HCL 0.05 % NA SOLN
NASAL | Status: DC | PRN
  Administered 2011-09-06: 1 via NASAL

## 2011-09-06 MED ORDER — LACTATED RINGERS IV SOLN
INTRAVENOUS | Status: DC
  Administered 2011-09-06: 11:00:00 via INTRAVENOUS
  Administered 2011-09-06: 20 mL/h via INTRAVENOUS
  Administered 2011-09-06: 12:00:00 via INTRAVENOUS

## 2011-09-06 MED ORDER — DEXTROSE 5 % IV SOLN
1.0000 g | Freq: Once | INTRAVENOUS | Status: AC
  Administered 2011-09-06: 1 g via INTRAVENOUS

## 2011-09-06 MED ORDER — EPHEDRINE SULFATE 50 MG/ML IJ SOLN
INTRAMUSCULAR | Status: DC | PRN
  Administered 2011-09-06: 10 mg via INTRAVENOUS

## 2011-09-06 MED ORDER — DEXAMETHASONE SODIUM PHOSPHATE 4 MG/ML IJ SOLN
INTRAMUSCULAR | Status: DC | PRN
  Administered 2011-09-06: 10 mg via INTRAVENOUS

## 2011-09-06 SURGICAL SUPPLY — 56 items
ADH SKN CLS APL DERMABOND .7 (GAUZE/BANDAGES/DRESSINGS) ×1
ATTRACTOMAT 16X20 MAGNETIC DRP (DRAPES) IMPLANT
BLADE RAD40 ROTATE 4M 4 5PK (BLADE) IMPLANT
BLADE RAD60 ROTATE M4 4 5PK (BLADE) IMPLANT
BLADE TRICUT ROTATE M4 4 5PK (BLADE) IMPLANT
BUR HS RAD FRONTAL 3 (BURR) IMPLANT
CANISTER SUC SOCK COL 7 IN (MISCELLANEOUS) ×2 IMPLANT
CANISTER SUCTION 1200CC (MISCELLANEOUS) ×2 IMPLANT
CATH SINUS BALLN 7X16 (CATHETERS) IMPLANT
CATH SINUS BALLN RELIEV 6X16 (SINUPLASTY) IMPLANT
CATH SINUS GUIDE F-70 (CATHETERS) IMPLANT
CATH SINUS GUIDE M/110 (CATHETERS) IMPLANT
CATH SINUS IRRIGATION 2.0 (CATHETERS) IMPLANT
CLOTH BEACON ORANGE TIMEOUT ST (SAFETY) ×2 IMPLANT
COAGULATOR SUCT 8FR VV (MISCELLANEOUS) ×1 IMPLANT
COAGULATOR SUCT SWTCH 10FR 6 (ELECTROSURGICAL) IMPLANT
DECANTER SPIKE VIAL GLASS SM (MISCELLANEOUS) IMPLANT
DERMABOND ADVANCED (GAUZE/BANDAGES/DRESSINGS) ×1
DERMABOND ADVANCED .7 DNX12 (GAUZE/BANDAGES/DRESSINGS) IMPLANT
DEVICE INFLATION 20/61 (MISCELLANEOUS) IMPLANT
DRSG NASAL KENNEDY LMNT 8CM (GAUZE/BANDAGES/DRESSINGS) ×2 IMPLANT
DRSG NASOPORE 8CM (GAUZE/BANDAGES/DRESSINGS) ×1 IMPLANT
DRSG TELFA 3X8 NADH (GAUZE/BANDAGES/DRESSINGS) IMPLANT
ELECT REM PT RETURN 9FT ADLT (ELECTROSURGICAL) ×2
ELECTRODE REM PT RTRN 9FT ADLT (ELECTROSURGICAL) IMPLANT
GLOVE BIO SURGEON STRL SZ7.5 (GLOVE) ×2 IMPLANT
GLOVE INDICATOR 7.0 STRL GRN (GLOVE) ×1 IMPLANT
GLOVE SKINSENSE NS SZ7.0 (GLOVE) ×2
GLOVE SKINSENSE STRL SZ7.0 (GLOVE) IMPLANT
GOWN PREVENTION PLUS XLARGE (GOWN DISPOSABLE) ×4 IMPLANT
HANDLE SINUS GUIDE LP (INSTRUMENTS) IMPLANT
HANDPIECE HYDRODEBRIDER FRONT (BLADE) IMPLANT
HEMOSTAT SURGICEL 2X14 (HEMOSTASIS) IMPLANT
IV NS 500ML (IV SOLUTION)
IV NS 500ML BAXH (IV SOLUTION) ×1 IMPLANT
NEEDLE 27GAX1X1/2 (NEEDLE) ×2 IMPLANT
NEEDLE SPNL 25GX3.5 QUINCKE BL (NEEDLE) IMPLANT
NS IRRIG 1000ML POUR BTL (IV SOLUTION) ×2 IMPLANT
PACK BASIN DAY SURGERY FS (CUSTOM PROCEDURE TRAY) ×2 IMPLANT
PACK ENT DAY SURGERY (CUSTOM PROCEDURE TRAY) ×2 IMPLANT
SET EXT MALE ROTATING LL 32IN (MISCELLANEOUS) ×2 IMPLANT
SET IV EXT TUBING FEMALE 31 (MISCELLANEOUS) IMPLANT
SLEEVE SCD COMPRESS KNEE MED (MISCELLANEOUS) ×1 IMPLANT
SOLUTION BUTLER CLEAR DIP (MISCELLANEOUS) ×3 IMPLANT
SPLINT NASAL DOYLE BI-VL (GAUZE/BANDAGES/DRESSINGS) IMPLANT
SPONGE GAUZE 2X2 8PLY STRL LF (GAUZE/BANDAGES/DRESSINGS) ×2 IMPLANT
SPONGE NEURO XRAY DETECT 1X3 (DISPOSABLE) ×2 IMPLANT
SUT ETHILON 3 0 PS 1 (SUTURE) IMPLANT
SUT PLAIN 4 0 ~~LOC~~ 1 (SUTURE) IMPLANT
SUT VICRYL 4-0 PS2 18IN ABS (SUTURE) ×2 IMPLANT
SYSTEM HYDRODEBRIDER (MISCELLANEOUS) IMPLANT
SYSTEM RELIEVA LUMA ILLUM (SINUPLASTY) IMPLANT
TOWEL OR 17X24 6PK STRL BLUE (TOWEL DISPOSABLE) ×2 IMPLANT
TUBE CONNECTING 20X1/4 (TUBING) ×2 IMPLANT
WATER STERILE IRR 1000ML POUR (IV SOLUTION) ×1 IMPLANT
YANKAUER SUCT BULB TIP NO VENT (SUCTIONS) ×1 IMPLANT

## 2011-09-06 NOTE — Anesthesia Postprocedure Evaluation (Signed)
  Anesthesia Post-op Note  Patient: Jenna Woods  Procedure(s) Performed: Procedure(s) (LRB): ENDOSCOPIC SINUS SURGERY (N/A)  Patient Location: PACU  Anesthesia Type: General  Level of Consciousness: awake, alert  and oriented  Airway and Oxygen Therapy: Patient Spontanous Breathing  Post-op Pain: mild  Post-op Assessment: Post-op Vital signs reviewed  Post-op Vital Signs: Reviewed  Complications: No apparent anesthesia complications

## 2011-09-06 NOTE — Anesthesia Preprocedure Evaluation (Signed)
Anesthesia Evaluation  Patient identified by MRN, date of birth, ID band Patient awake    Reviewed: Allergy & Precautions, H&P , NPO status , Patient's Chart, lab work & pertinent test results, reviewed documented beta blocker date and time   Airway Mallampati: II TM Distance: >3 FB Neck ROM: full    Dental   Pulmonary sleep apnea ,          Cardiovascular hypertension, On Medications     Neuro/Psych PSYCHIATRIC DISORDERS negative neurological ROS     GI/Hepatic Neg liver ROS, GERD-  Medicated and Controlled,  Endo/Other  Morbid obesity  Renal/GU negative Renal ROS  negative genitourinary   Musculoskeletal   Abdominal   Peds  Hematology negative hematology ROS (+)   Anesthesia Other Findings See surgeon's H&P   Reproductive/Obstetrics negative OB ROS                           Anesthesia Physical Anesthesia Plan  ASA: III  Anesthesia Plan: General   Post-op Pain Management:    Induction: Intravenous  Airway Management Planned: Oral ETT  Additional Equipment:   Intra-op Plan:   Post-operative Plan: Extubation in OR  Informed Consent: I have reviewed the patients History and Physical, chart, labs and discussed the procedure including the risks, benefits and alternatives for the proposed anesthesia with the patient or authorized representative who has indicated his/her understanding and acceptance.   Dental Advisory Given  Plan Discussed with: CRNA and Surgeon  Anesthesia Plan Comments:         Anesthesia Quick Evaluation

## 2011-09-06 NOTE — H&P (Signed)
  H&P Update  Pt's original H&P dated 08/18/11 reviewed and placed in chart (to be scanned).  I personally examined the patient today.  No change in health. Proceed with endoscopic repair of cerebrospinal fluid rhinorrhea. Temporalis fascia graft and auricular cartilage graft will be used as the repair materials.

## 2011-09-06 NOTE — Transfer of Care (Signed)
Immediate Anesthesia Transfer of Care Note  Patient: Jenna Woods  Procedure(s) Performed: Procedure(s) (LRB): ENDOSCOPIC SINUS SURGERY (N/A)  Patient Location: PACU  Anesthesia Type: General  Level of Consciousness: sedated  Airway & Oxygen Therapy: Patient Spontanous Breathing and Patient connected to face mask oxygen  Post-op Assessment: Report given to PACU RN and Post -op Vital signs reviewed and stable  Post vital signs: Reviewed and stable  Complications: No apparent anesthesia complications

## 2011-09-06 NOTE — Brief Op Note (Signed)
09/06/2011  5:57 PM  PATIENT:  Jenna Woods  51 y.o. female  PRE-OPERATIVE DIAGNOSIS:  cerebrospinal fluid leak  POST-OPERATIVE DIAGNOSIS:  cerebrospinal fluid leak  PROCEDURE:  Procedure(s) (LRB): 1) ENDOSCOPIC repair of nasal cerebrospinal fluid leak, ethmoid region 2) Left auricular cartilage graft 3) Left temporalis fascia graft  SURGEON:  Surgeon(s) and Role:    * Darletta Moll, MD - Primary  PHYSICIAN ASSISTANT:   ASSISTANTS: none   ANESTHESIA:   general  EBL:  Total I/O In: 2810 [P.O.:210; I.V.:2600] Out: -   BLOOD ADMINISTERED:none  DRAINS: none   LOCAL MEDICATIONS USED:  LIDOCAINE   SPECIMEN:  No Specimen  DISPOSITION OF SPECIMEN:  N/A  COUNTS:  YES  TOURNIQUET:  * No tourniquets in log *  DICTATION: .Other Dictation: Dictation Number [id number]  PLAN OF CARE: Discharge to home after PACU  PATIENT DISPOSITION:  PACU - hemodynamically stable.   Delay start of Pharmacological VTE agent (>24hrs) due to surgical blood loss or risk of bleeding: not applicable

## 2011-09-07 NOTE — Op Note (Signed)
NAMEPHILIS, DOKE NO.:  [id number]  MEDICAL RECORD NO.:  [id number]  LOCATION:                                 FACILITY:  PHYSICIAN:  Newman Pies, MD            DATE OF BIRTH:  [birth year]  DATE OF PROCEDURE:  09/06/2011 DATE OF DISCHARGE:                              OPERATIVE REPORT   SURGEON:  Newman Pies, MD  PREOPERATIVE DIAGNOSES:  Cerebrospinal fluid rhinorrhea (from cribriform plate/ethmoid region).  POSTOPERATIVE DIAGNOSIS:  Cerebrospinal fluid rhinorrhea (from cribriform plate/ethmoid region).  PROCEDURE PERFORMED: 1. Endoscopic repair of cerebrospinal fluid rhinorrhea (cribriform plate/ethmoid     region). 2. Left auricular cartilage graft. 3. Left temporalis fascia graft (separate incision).  ANESTHESIA:  General endotracheal tube anesthesia.  COMPLICATIONS:  None.  ESTIMATED BLOOD LOSS:  Minimal.  INDICATION FOR THE PROCEDURE:  The patient is a 51 year old female with a 35-month history of left-sided clear rhinorrhea.  The patient was previously treated for allergy, however, she continues to be symptomatic despite medical treatment.  On examination, she was noted to have a copious amount of clear left-sided rhinorrhea.  The rhinorrhea was collected and was tested for beta 2 transferrin.  It was strongly positive.  She subsequently underwent a CT cisternogram.  The results were suggestive of CSF leak from the cribriform plate region.  Based on the above findings, the decision was made for the patient to undergo endoscopic repair of her CSF leak with auricular cartilage and temporalis fascia graft.  The risks, benefits, alternatives, and details of the procedure were discussed with the patient.  Questions were invited and answered.  Informed consent was obtained.  DESCRIPTION OF THE PROCEDURE:  The patient was taken to the operating room and placed supine on the operating table.  General endotracheal tube anesthesia was administered by the  anesthesiologist.  Preop IV antibiotics (Rocephin) was given to the patient.  Pledgets soaked with Afrin were placed in the left nasal cavity for vasoconstriction.  The pledgets were subsequently removed.  With a 0 degree endoscope, the left nasal cavity was examined.  Clear rhinorrhea was noted within the left nasal cavity.  Inspection of the superior nasal cavity reveals an area of dehiscence, with approximately 1 cm of dura tissue herniating into the nasal cavity.  Clear rhinorrhea could be seen from the exposed dura.  Attention was then focused on obtaining the graft necessary for the CSF leak repair.  A 1% lidocaine with 1:100,000 epinephrine was injected onto the left auricular cartilage and the postauricular crease. Attention was first focused on obtaining the temporalis fascia graft. The 2 cm incisions were made along the superior left postauricular crease.  The incision was carried down to the level of the temporalis fascia.  A 2 x 3 cm temporalis fascia graft was obtained.  The surgical site was copiously irrigated.  Hemostasis was achieved with suction electrocautery.  The incision was closed in layers with 4-0 Vicryl and Dermabond.  Attention was then focused on obtaining the cartilage graft.  A 2 cm curved incision was made on the posterior aspect of the left auricle. The incision was carried to the level of the auricular  cartilage.  The soft tissue was carefully elevated free from the auricular cartilage. The entire concha cartilage was then removed from its skin attachment. The surgical site was again irrigated.  Through and through quilting sutures were placed.  The incision was closed with 4-0 Vicryl sutures.  With a 0 degree endoscope, the left nasal cavity was again examined. The herniated portion of the dura was carefully reduced.  Strips of cartilage was then used to reinforce the superior aspect of the nasal cavity.  The surrounding nasal mucosa was then carefully  stripped from its underlying bony attachment, creating recipient bed.  The temporalis fascia graft was then used in an overlay fashion to cover the entire superior nasal cavity.  In order to reinforce the layered repair, strips of Nasopore was then used to buttress the layered repair.  Strips of 0.5 inch sterile gauze was then used to pack the superior and the medial aspect of the nasal cavity.  Finally 8 cm Merocel was placed in the inferior portion of the nasal cavity.  That conclude the CSF leak repair for the patient.  The care of the patient was turned over to the anesthesiologist.  The patient was awakened from anesthesia without difficulty.  She was extubated and transferred to the recovery room in good condition.  OPERATIVE FINDINGS:  A 1 cm dehiscent area was noted at the left superior nasal cavity.  Herniating dura was noted.  Active CSF leak was noted from that area.  The CSF leak was repaired in layers with auricular cartilage and temporalis fascia graft.  The repair was reinforced with Nasapore, 0.5 inch sterile strips, and Merocel packing.  SPECIMEN:  None.  FOLLOWUP CARE:  The patient will follow up in my office in 10 days for packing removal.  The patient was instructed to avoid any heavy exertion.  She will be placed on Augmentin 875 mg p.o. b.i.d. for 10 days, and Vicodin p.r.n. pain.     Newman Pies, MD     ST/MEDQ  D:  09/06/2011  T:  09/07/2011  Job:  [id number]  cc:   Milus Mallick. Lodema Hong, M.D.

## 2011-09-09 ENCOUNTER — Encounter (HOSPITAL_BASED_OUTPATIENT_CLINIC_OR_DEPARTMENT_OTHER): Payer: Self-pay | Admitting: Otolaryngology

## 2011-09-11 ENCOUNTER — Other Ambulatory Visit: Payer: Self-pay | Admitting: Family Medicine

## 2011-09-13 ENCOUNTER — Encounter (HOSPITAL_BASED_OUTPATIENT_CLINIC_OR_DEPARTMENT_OTHER): Payer: Self-pay

## 2011-09-15 ENCOUNTER — Telehealth: Payer: Self-pay | Admitting: Family Medicine

## 2011-09-15 ENCOUNTER — Ambulatory Visit (INDEPENDENT_AMBULATORY_CARE_PROVIDER_SITE_OTHER): Payer: No Typology Code available for payment source | Admitting: Otolaryngology

## 2011-09-15 MED ORDER — ESOMEPRAZOLE MAGNESIUM 40 MG PO CPDR: 40.0000 mg | DELAYED_RELEASE_CAPSULE | Freq: Every day | ORAL | Status: DC

## 2011-09-15 NOTE — Telephone Encounter (Signed)
Refilled as requested  

## 2011-09-22 ENCOUNTER — Ambulatory Visit (INDEPENDENT_AMBULATORY_CARE_PROVIDER_SITE_OTHER): Payer: No Typology Code available for payment source | Admitting: Family Medicine

## 2011-09-22 ENCOUNTER — Encounter: Payer: Self-pay | Admitting: Family Medicine

## 2011-09-22 VITALS — BP 120/82 | HR 90 | Resp 16 | Ht 66.0 in | Wt 239.4 lb

## 2011-09-22 DIAGNOSIS — E669 Obesity, unspecified: Secondary | ICD-10-CM

## 2011-09-22 DIAGNOSIS — E559 Vitamin D deficiency, unspecified: Secondary | ICD-10-CM

## 2011-09-22 DIAGNOSIS — F419 Anxiety disorder, unspecified: Secondary | ICD-10-CM

## 2011-09-22 DIAGNOSIS — F411 Generalized anxiety disorder: Secondary | ICD-10-CM

## 2011-09-22 DIAGNOSIS — J309 Allergic rhinitis, unspecified: Secondary | ICD-10-CM

## 2011-09-22 DIAGNOSIS — Z13220 Encounter for screening for lipoid disorders: Secondary | ICD-10-CM

## 2011-09-22 DIAGNOSIS — I10 Essential (primary) hypertension: Secondary | ICD-10-CM

## 2011-09-22 DIAGNOSIS — F329 Major depressive disorder, single episode, unspecified: Secondary | ICD-10-CM

## 2011-09-22 DIAGNOSIS — F3289 Other specified depressive episodes: Secondary | ICD-10-CM

## 2011-09-22 DIAGNOSIS — R5381 Other malaise: Secondary | ICD-10-CM

## 2011-09-22 LAB — LIPID PANEL
HDL: 56 mg/dL (ref >39)
LDL Cholesterol: 70 mg/dL (ref 0–99)
Total CHOL/HDL Ratio: 2.5 Ratio

## 2011-09-22 NOTE — Patient Instructions (Addendum)
Annual wellnes exam in 3 month  HBA1C, tSH, vit D, fasting lipid today.  It is important that you exercise regularly at least 30 minutes 5 times a week. If you develop chest pain, have severe difficulty breathing, or feel very tired, stop exercising immediately and seek medical attention   A healthy diet is rich in fruit, vegetables and whole grains. Poultry fish, nuts and beans are a healthy choice for protein rather then red meat. A low sodium diet and drinking 64 ounces of water daily is generally recommended. Oils and sweet should be limited. Carbohydrates especially for those who are diabetic or overweight, should be limited to 30-45 gram per meal. It is important to eat on a regular schedule, at least 3 times daily. Snacks should be primarily fruits, vegetables or nuts.   OK to resume phentermine

## 2011-09-23 DIAGNOSIS — E559 Vitamin D deficiency, unspecified: Secondary | ICD-10-CM | POA: Insufficient documentation

## 2011-09-24 ENCOUNTER — Encounter: Payer: Self-pay | Admitting: Family Medicine

## 2011-09-24 NOTE — Assessment & Plan Note (Signed)
Recently decompensated due to poor health and actually had to call suicide line, had become withdrawn from everyone

## 2011-09-24 NOTE — Assessment & Plan Note (Signed)
Controlled, no change in medication  

## 2011-09-24 NOTE — Assessment & Plan Note (Signed)
Chronic , uncontrolled, continue xanax

## 2011-09-24 NOTE — Progress Notes (Signed)
  Subjective:    Patient ID: Jenna Woods, female    DOB: [birth year], 51 y.o.   MRN: [id number]  HPI The PT is here for follow up and re-evaluation of chronic medical conditions, medication management and review of any available recent lab and radiology data.  Preventive health is updated, specifically  Cancer screening and Immunization.   Pt has been significantly ill since March with unilateral nasal drainage due to CSF leak, she recently had successful surgery and feels much better. She fortunately hd no physical complications like headache or infection, but emotionally  and mentally decompensated, became withdrawn and siucicdal The PT denies any adverse reactions to current medications since the last visit.        Review of Systems .cho1 Denies recent fever or chills. Denies sinus pressure, nasal congestion, ear pain or sore throat. Denies chest congestion, productive cough or wheezing. Denies chest pains, palpitations and leg swelling Denies abdominal pain, nausea, vomiting,diarrhea or constipation.   Denies dysuria, frequency, hesitancy or incontinence. Denies joint pain, swelling and limitation in mobility. Denies headaches, seizures, numbness, or tingling.  Denies skin break down or rash.        Objective:   Physical Exam  Patient alert and oriented and in no cardiopulmonary distress.  HEENT: No facial asymmetry, EOMI, no sinus tenderness,  oropharynx pink and moist.  Neck supple no adenopathy.  Chest: Clear to auscultation bilaterally.  CVS: S1, S2 no murmurs, no S3.  ABD: Soft non tender. Bowel sounds normal.  Ext: No edema  MS: Adequate ROM spine, shoulders, hips and knees.  Skin: Intact, no ulcerations or rash noted.  Psych: Good eye contact, normal affect. Memory intact  Mildly anxious  And  depressed appearing.  CNS: CN 2-12 intact, power, tone and sensation normal throughout.       Assessment & Plan:

## 2011-09-24 NOTE — Assessment & Plan Note (Signed)
Deteriorated. Patient re-educated about  the importance of commitment to a  minimum of 150 minutes of exercise per week. The importance of healthy food choices with portion control discussed. Encouraged to start a food diary, count calories and to consider  joining a support group. Sample diet sheets offered. Goals set by the patient for the next several months.    

## 2011-09-28 ENCOUNTER — Other Ambulatory Visit: Payer: Self-pay

## 2011-09-28 DIAGNOSIS — E559 Vitamin D deficiency, unspecified: Secondary | ICD-10-CM

## 2011-09-28 MED ORDER — ERGOCALCIFEROL 1.25 MG (50000 UT) PO CAPS: 50000.0000 [IU] | ORAL_CAPSULE | ORAL | Status: DC

## 2011-10-06 ENCOUNTER — Ambulatory Visit (INDEPENDENT_AMBULATORY_CARE_PROVIDER_SITE_OTHER): Payer: No Typology Code available for payment source | Admitting: Otolaryngology

## 2011-10-30 ENCOUNTER — Other Ambulatory Visit: Payer: Self-pay | Admitting: Family Medicine

## 2011-10-31 ENCOUNTER — Other Ambulatory Visit: Payer: Self-pay

## 2011-10-31 ENCOUNTER — Telehealth: Payer: Self-pay | Admitting: Family Medicine

## 2011-10-31 MED ORDER — ALPRAZOLAM 1 MG PO TABS: 1.0000 mg | ORAL_TABLET | Freq: Three times a day (TID) | ORAL | Status: DC

## 2011-10-31 NOTE — Telephone Encounter (Signed)
Med phoned in °

## 2011-11-09 ENCOUNTER — Ambulatory Visit (INDEPENDENT_AMBULATORY_CARE_PROVIDER_SITE_OTHER): Payer: No Typology Code available for payment source

## 2011-11-09 VITALS — BP 122/88 | Wt 230.0 lb

## 2011-11-09 DIAGNOSIS — Z111 Encounter for screening for respiratory tuberculosis: Secondary | ICD-10-CM

## 2011-11-10 ENCOUNTER — Other Ambulatory Visit: Payer: Self-pay | Admitting: Family Medicine

## 2011-11-11 LAB — TB SKIN TEST
Induration: 0 mm
TB Skin Test: NEGATIVE

## 2011-11-23 ENCOUNTER — Other Ambulatory Visit: Payer: Self-pay | Admitting: Family Medicine

## 2011-11-23 DIAGNOSIS — Z139 Encounter for screening, unspecified: Secondary | ICD-10-CM

## 2011-11-29 ENCOUNTER — Other Ambulatory Visit: Payer: Self-pay | Admitting: Family Medicine

## 2011-12-05 ENCOUNTER — Telehealth: Payer: Self-pay | Admitting: Family Medicine

## 2011-12-05 ENCOUNTER — Other Ambulatory Visit: Payer: Self-pay

## 2011-12-05 MED ORDER — PHENTERMINE HCL 37.5 MG PO TABS: 37.5000 mg | ORAL_TABLET | Freq: Every day | ORAL | Status: DC

## 2011-12-05 NOTE — Telephone Encounter (Signed)
Do you want to refill phentermine? 

## 2011-12-05 NOTE — Telephone Encounter (Signed)
pls send in 1 month, and advise she needs appt in Novemebr  Remind her to follow calorie restricted diet and start regular physical activity

## 2011-12-06 NOTE — Telephone Encounter (Signed)
Patient aware and states that she has an appointment in oct.

## 2011-12-29 ENCOUNTER — Ambulatory Visit (HOSPITAL_COMMUNITY)
Admission: RE | Admit: 2011-12-29 | Discharge: 2011-12-29 | Disposition: A | Discharge status: Home / Self Care | Payer: No Typology Code available for payment source | Source: Ambulatory Visit | Attending: Family Medicine | Admitting: Family Medicine

## 2011-12-29 DIAGNOSIS — Z139 Encounter for screening, unspecified: Secondary | ICD-10-CM

## 2011-12-29 DIAGNOSIS — Z1231 Encounter for screening mammogram for malignant neoplasm of breast: Secondary | ICD-10-CM | POA: Insufficient documentation

## 2012-01-03 ENCOUNTER — Encounter: Payer: No Typology Code available for payment source | Admitting: Family Medicine

## 2012-01-08 ENCOUNTER — Other Ambulatory Visit: Payer: Self-pay | Admitting: Family Medicine

## 2012-01-10 ENCOUNTER — Other Ambulatory Visit: Payer: Self-pay

## 2012-01-10 ENCOUNTER — Ambulatory Visit (INDEPENDENT_AMBULATORY_CARE_PROVIDER_SITE_OTHER): Payer: No Typology Code available for payment source

## 2012-01-10 DIAGNOSIS — Z23 Encounter for immunization: Secondary | ICD-10-CM

## 2012-01-10 MED ORDER — ESOMEPRAZOLE MAGNESIUM 40 MG PO CPDR: 40.0000 mg | DELAYED_RELEASE_CAPSULE | Freq: Every day | ORAL | Status: DC

## 2012-01-10 MED ORDER — ALPRAZOLAM 1 MG PO TABS: 1.0000 mg | ORAL_TABLET | Freq: Three times a day (TID) | ORAL | Status: DC

## 2012-01-10 MED ORDER — PHENTERMINE HCL 37.5 MG PO TABS: 37.5000 mg | ORAL_TABLET | Freq: Every day | ORAL | Status: DC

## 2012-01-29 ENCOUNTER — Other Ambulatory Visit: Payer: Self-pay | Admitting: Family Medicine

## 2012-02-10 ENCOUNTER — Encounter: Payer: Self-pay | Admitting: Family Medicine

## 2012-02-10 ENCOUNTER — Other Ambulatory Visit: Payer: Self-pay | Admitting: Family Medicine

## 2012-02-10 ENCOUNTER — Ambulatory Visit (INDEPENDENT_AMBULATORY_CARE_PROVIDER_SITE_OTHER): Payer: No Typology Code available for payment source | Admitting: Family Medicine

## 2012-02-10 VITALS — BP 112/80 | HR 84 | Resp 15 | Ht 66.0 in | Wt 223.8 lb

## 2012-02-10 DIAGNOSIS — I10 Essential (primary) hypertension: Secondary | ICD-10-CM

## 2012-02-10 DIAGNOSIS — R5381 Other malaise: Secondary | ICD-10-CM

## 2012-02-10 DIAGNOSIS — D649 Anemia, unspecified: Secondary | ICD-10-CM

## 2012-02-10 DIAGNOSIS — E559 Vitamin D deficiency, unspecified: Secondary | ICD-10-CM

## 2012-02-10 DIAGNOSIS — Z0000 Encounter for general adult medical examination without abnormal findings: Secondary | ICD-10-CM

## 2012-02-10 DIAGNOSIS — E669 Obesity, unspecified: Secondary | ICD-10-CM

## 2012-02-10 DIAGNOSIS — R5383 Other fatigue: Secondary | ICD-10-CM

## 2012-02-10 NOTE — Patient Instructions (Addendum)
F/U in 3 month, call if you need me before  Congrats on weight loss.Continue half phentermine once daily  Work on improved eating habits, and continue exercise.   Weight loss goal of 4 pounds per month  Start once daily calcium with D 1200mg  /1000Iu  Gel capsule.  Fasting chem 7, HBA1C , vit D, tsh cbc  And anemia panel in 3 month
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
## 2012-02-12 DIAGNOSIS — Z0000 Encounter for general adult medical examination without abnormal findings: Secondary | ICD-10-CM | POA: Insufficient documentation

## 2012-02-12 NOTE — Assessment & Plan Note (Addendum)
Annual wellness exam documented. Pt is very functional, initially placed on disability for autoimmune disease and she has a long standing /o depression with past suicidal ideation. Advanaced directives were discussed, she assures me that she already has a living will  Full code, bur does not want to be kept on lifesupport "indefinietely" if prognosis is poor Jenna Woods is brain dead The need to continue to develop good eating habits and continue and increase physical activity to promote improved health and weight loss is discussed. Cancer screening and immunization is Up to date

## 2012-02-13 ENCOUNTER — Other Ambulatory Visit: Payer: Self-pay

## 2012-02-13 ENCOUNTER — Other Ambulatory Visit: Payer: Self-pay | Admitting: Family Medicine

## 2012-02-13 DIAGNOSIS — J309 Allergic rhinitis, unspecified: Secondary | ICD-10-CM

## 2012-02-13 MED ORDER — LORATADINE 10 MG PO TABS: 10.0000 mg | ORAL_TABLET | Freq: Every day | ORAL | Status: DC

## 2012-02-13 MED ORDER — ALPRAZOLAM 1 MG PO TABS: ORAL_TABLET | ORAL | Status: DC

## 2012-02-13 MED ORDER — ESOMEPRAZOLE MAGNESIUM 40 MG PO CPDR: 40.0000 mg | DELAYED_RELEASE_CAPSULE | Freq: Every day | ORAL | Status: DC

## 2012-02-13 MED ORDER — AMLODIPINE BESYLATE 10 MG PO TABS: ORAL_TABLET | ORAL | Status: DC

## 2012-03-21 DIAGNOSIS — M722 Plantar fascial fibromatosis: Secondary | ICD-10-CM | POA: Diagnosis not present

## 2012-03-21 DIAGNOSIS — M79609 Pain in unspecified limb: Secondary | ICD-10-CM | POA: Diagnosis not present

## 2012-04-26 ENCOUNTER — Ambulatory Visit (INDEPENDENT_AMBULATORY_CARE_PROVIDER_SITE_OTHER): Payer: No Typology Code available for payment source | Admitting: Otolaryngology

## 2012-05-18 ENCOUNTER — Ambulatory Visit: Payer: No Typology Code available for payment source | Admitting: Family Medicine

## 2012-05-22 ENCOUNTER — Ambulatory Visit: Payer: No Typology Code available for payment source | Admitting: Family Medicine

## 2012-05-29 ENCOUNTER — Other Ambulatory Visit: Payer: Self-pay | Admitting: Family Medicine

## 2012-06-02 ENCOUNTER — Other Ambulatory Visit: Payer: Self-pay | Admitting: Family Medicine

## 2012-06-05 ENCOUNTER — Encounter: Payer: Self-pay | Admitting: Family Medicine

## 2012-06-05 ENCOUNTER — Ambulatory Visit (INDEPENDENT_AMBULATORY_CARE_PROVIDER_SITE_OTHER): Payer: No Typology Code available for payment source | Admitting: Family Medicine

## 2012-06-05 VITALS — BP 110/80 | HR 92 | Resp 18 | Ht 66.0 in | Wt 223.0 lb

## 2012-06-05 DIAGNOSIS — E559 Vitamin D deficiency, unspecified: Secondary | ICD-10-CM

## 2012-06-05 DIAGNOSIS — R7309 Other abnormal glucose: Secondary | ICD-10-CM

## 2012-06-05 DIAGNOSIS — F419 Anxiety disorder, unspecified: Secondary | ICD-10-CM

## 2012-06-05 DIAGNOSIS — K219 Gastro-esophageal reflux disease without esophagitis: Secondary | ICD-10-CM

## 2012-06-05 DIAGNOSIS — F411 Generalized anxiety disorder: Secondary | ICD-10-CM

## 2012-06-05 DIAGNOSIS — J309 Allergic rhinitis, unspecified: Secondary | ICD-10-CM

## 2012-06-05 DIAGNOSIS — R5381 Other malaise: Secondary | ICD-10-CM

## 2012-06-05 DIAGNOSIS — I10 Essential (primary) hypertension: Secondary | ICD-10-CM

## 2012-06-05 DIAGNOSIS — E669 Obesity, unspecified: Secondary | ICD-10-CM

## 2012-06-05 DIAGNOSIS — R7302 Impaired glucose tolerance (oral): Secondary | ICD-10-CM | POA: Insufficient documentation

## 2012-06-05 MED ORDER — MOMETASONE FUROATE 50 MCG/ACT NA SUSP: 2.0000 | Freq: Every day | NASAL | Status: DC

## 2012-06-05 MED ORDER — EPINEPHRINE 0.3 MG/0.3ML IJ DEVI: INTRAMUSCULAR | Status: AC

## 2012-06-05 NOTE — Assessment & Plan Note (Signed)
Updated lab needed Patient educated about the importance of limiting  Carbohydrate intake , the need to commit to daily physical activity for a minimum of 30 minutes , and to commit weight loss. The fact that changes in all these areas will reduce or eliminate all together the development of diabetes is stressed.    

## 2012-06-05 NOTE — Patient Instructions (Addendum)
F/u in 4 month  Weight loss goal of 8 pounds.   CBC, TSH. Chem 7, HBa1C, vit D  It is important that you exercise regularly at least 30 minutes 5 times a week. If you develop chest pain, have severe difficulty breathing, or feel very tired, stop exercising immediately and seek medical attention   A healthy diet is rich in fruit, vegetables and whole grains. Poultry fish, nuts and beans are a healthy choice for protein rather then red meat. A low sodium diet and drinking 64 ounces of water daily is generally recommended. Oils and sweet should be limited. Carbohydrates especially for those who are diabetic or overweight, should be limited to 30-45 gram per meal. It is important to eat on a regular schedule, at least 3 times daily. Snacks should be primarily fruits, vegetables or nuts.  Blood pressure is excellent

## 2012-06-05 NOTE — Assessment & Plan Note (Signed)
Unchanged. Patient re-educated about  the importance of commitment to a  minimum of 150 minutes of exercise per week. The importance of healthy food choices with portion control discussed. Encouraged to start a food diary, count calories and to consider  joining a support group. Sample diet sheets offered. Goals set by the patient for the next several months.    

## 2012-06-05 NOTE — Assessment & Plan Note (Signed)
Increased symptoms in the Spring , add nasal steroid

## 2012-06-05 NOTE — Progress Notes (Signed)
  Subjective:    Patient ID: Jenna Woods, female    DOB: Nov 09, 1960, 52 y.o.   MRN: [id number]  HPI The PT is here for follow up and re-evaluation of chronic medical conditions, medication management and review of any available recent lab and radiology data.  Preventive health is updated, specifically  Cancer screening and Immunization.   Questions or concerns regarding consultations or procedures which the PT has had in the interim are  addressed. The PT denies any adverse reactions to current medications since the last visit.  There are no new concerns. Anticipates increased allergies in the Spring with nasal congestion and sneeze, currently on tablet Plans to commit to more regular exercise and focus on ongoing weight loss     Review of Systems See HPI Denies recent fever or chills. Denies sinus pressure, nasal congestion, ear pain or sore throat. Denies chest congestion, productive cough or wheezing. Denies chest pains, palpitations and leg swelling Denies abdominal pain, nausea, vomiting,diarrhea or constipation.   Denies dysuria, frequency, hesitancy or incontinence. Denies joint pain, swelling and limitation in mobility. Denies headaches, seizures, numbness, or tingling. Denies depression, anxiety or insomnia. Denies skin break down or rash.        Objective:   Physical Exam  Patient alert and oriented and in no cardiopulmonary distress.  HEENT: No facial asymmetry, EOMI, no sinus tenderness,  oropharynx pink and moist.  Neck supple no adenopathy.  Chest: Clear to auscultation bilaterally.  CVS: S1, S2 no murmurs, no S3.  ABD: Soft non tender. Bowel sounds normal.  Ext: No edema  MS: Adequate ROM spine, shoulders, hips and knees.  Skin: Intact, no ulcerations or rash noted.  Psych: Good eye contact, normal affect. Memory intact not anxious or depressed appearing.  CNS: CN 2-12 intact, power, tone and sensation normal throughout.       Assessment &  Plan:

## 2012-06-05 NOTE — Assessment & Plan Note (Signed)
Controlled, no change in medication  

## 2012-06-05 NOTE — Assessment & Plan Note (Signed)
Controlled, no change in medication DASH diet and commitment to daily physical activity for a minimum of 30 minutes discussed and encouraged, as a part of hypertension management. The importance of attaining a healthy weight is also discussed.  

## 2012-06-06 ENCOUNTER — Other Ambulatory Visit: Payer: Self-pay

## 2012-06-06 ENCOUNTER — Telehealth: Payer: Self-pay | Admitting: Family Medicine

## 2012-06-06 MED ORDER — ALPRAZOLAM 1 MG PO TABS: ORAL_TABLET | ORAL | Status: DC

## 2012-06-06 MED ORDER — PHENTERMINE HCL 37.5 MG PO TABS: ORAL_TABLET | ORAL | Status: DC

## 2012-06-07 LAB — HEMOGLOBIN A1C: Mean Plasma Glucose: 134 mg/dL — ABNORMAL HIGH (ref <117)

## 2012-06-07 LAB — BASIC METABOLIC PANEL
BUN: 15 mg/dL (ref 6–23)
CO2: 28 mEq/L (ref 19–32)
Chloride: 108 mEq/L (ref 96–112)
Creat: 0.8 mg/dL (ref 0.50–1.10)
Glucose, Bld: 94 mg/dL (ref 70–99)
Potassium: 4.2 mEq/L (ref 3.5–5.3)

## 2012-06-07 LAB — CBC
HCT: 37.9 % (ref 36.0–46.0)
Hemoglobin: 13.1 g/dL (ref 12.0–15.0)
MCH: 29.6 pg (ref 26.0–34.0)
MCHC: 34.6 g/dL (ref 30.0–36.0)
MCV: 85.7 fL (ref 78.0–100.0)
RDW: 13.2 % (ref 11.5–15.5)

## 2012-06-07 NOTE — Telephone Encounter (Signed)
Already sent in

## 2012-06-25 ENCOUNTER — Other Ambulatory Visit: Payer: Self-pay | Admitting: Family Medicine

## 2012-08-02 ENCOUNTER — Other Ambulatory Visit: Payer: Self-pay | Admitting: Family Medicine

## 2012-10-05 ENCOUNTER — Encounter: Payer: Self-pay | Admitting: Family Medicine

## 2012-10-05 ENCOUNTER — Ambulatory Visit (INDEPENDENT_AMBULATORY_CARE_PROVIDER_SITE_OTHER): Payer: No Typology Code available for payment source | Admitting: Family Medicine

## 2012-10-05 VITALS — BP 128/82 | HR 90 | Resp 18 | Ht 66.0 in | Wt 215.0 lb

## 2012-10-05 DIAGNOSIS — R32 Unspecified urinary incontinence: Secondary | ICD-10-CM

## 2012-10-05 DIAGNOSIS — E669 Obesity, unspecified: Secondary | ICD-10-CM

## 2012-10-05 DIAGNOSIS — I10 Essential (primary) hypertension: Secondary | ICD-10-CM

## 2012-10-05 DIAGNOSIS — F411 Generalized anxiety disorder: Secondary | ICD-10-CM

## 2012-10-05 DIAGNOSIS — R7302 Impaired glucose tolerance (oral): Secondary | ICD-10-CM

## 2012-10-05 DIAGNOSIS — F419 Anxiety disorder, unspecified: Secondary | ICD-10-CM

## 2012-10-05 DIAGNOSIS — J309 Allergic rhinitis, unspecified: Secondary | ICD-10-CM

## 2012-10-05 DIAGNOSIS — R7309 Other abnormal glucose: Secondary | ICD-10-CM

## 2012-10-05 DIAGNOSIS — Z13220 Encounter for screening for lipoid disorders: Secondary | ICD-10-CM

## 2012-10-05 MED ORDER — PHENTERMINE HCL 37.5 MG PO TABS: 37.5000 mg | ORAL_TABLET | Freq: Every day | ORAL | Status: DC

## 2012-10-05 MED ORDER — ALPRAZOLAM 1 MG PO TABS: ORAL_TABLET | ORAL | Status: DC

## 2012-10-05 MED ORDER — AMLODIPINE BESYLATE 10 MG PO TABS: ORAL_TABLET | ORAL | Status: DC

## 2012-10-05 NOTE — Patient Instructions (Addendum)
F/U in 4 month, call if you need me before   Flu vaccine available in October   Fasting lipid, chem 7, HBA1C, I will send result to you on "My chart"  Non fasting HBa1C in December   Weight loss goal of 3 pounds per month  pls sched mammogram for October   It is important that you exercise regularly at least 30 minutes 5 times a week. If you develop chest pain, have severe difficulty breathing, or feel very tired, stop exercising immediately and seek medical attention   No med changes at this time

## 2012-10-05 NOTE — Progress Notes (Signed)
  Subjective:    Patient ID: Jenna Woods, female    DOB: January 08, 1961, 52 y.o.   MRN: [id number]  HPI The PT is here for follow up and re-evaluation of chronic medical conditions, medication management and review of any available recent lab and radiology data.  Preventive health is updated, specifically  Cancer screening and Immunization.   Questions or concerns regarding consultations or procedures which the PT has had in the interim are  addressed. The PT denies any adverse reactions to current medications since the last visit.  There are no new concerns.  There are no specific complaints       Review of Systems See HPI Denies recent fever or chills. Denies sinus pressure, nasal congestion, ear pain or sore throat. Denies chest congestion, productive cough or wheezing. Denies chest pains, palpitations and leg swelling Denies abdominal pain, nausea, vomiting,diarrhea or constipation.   Denies dysuria, frequency, hesitancy or incontinence. Denies joint pain, swelling and limitation in mobility. Denies headaches, seizures, numbness, or tingling. Denies depression, anxiety or insomnia. Denies skin break down or rash.        Objective:   Physical Exam  Patient alert and oriented and in no cardiopulmonary distress.  HEENT: No facial asymmetry, EOMI, no sinus tenderness,  oropharynx pink and moist.  Neck supple no adenopathy.  Chest: Clear to auscultation bilaterally.  CVS: S1, S2 no murmurs, no S3.  ABD: Soft non tender. Bowel sounds normal.  Ext: No edema  MS: Adequate ROM spine, shoulders, hips and knees.  Skin: Intact, no ulcerations or rash noted.  Psych: Good eye contact, normal affect. Memory intact not anxious or depressed appearing.  CNS: CN 2-12 intact, power, tone and sensation normal throughout.       Assessment & Plan:

## 2012-10-06 NOTE — Assessment & Plan Note (Signed)
Controlled, no change in medication  

## 2012-10-06 NOTE — Assessment & Plan Note (Signed)
Improved. Pt applauded on succesful weight loss through lifestyle change, and encouraged to continue same. Weight loss goal set for the next several months.  

## 2012-10-06 NOTE — Assessment & Plan Note (Signed)
Updated lab needed Patient educated about the importance of limiting  Carbohydrate intake , the need to commit to daily physical activity for a minimum of 30 minutes , and to commit weight loss. The fact that changes in all these areas will reduce or eliminate all together the development of diabetes is stressed.    

## 2012-10-06 NOTE — Assessment & Plan Note (Signed)
Controlled, no change in medication DASH diet and commitment to daily physical activity for a minimum of 30 minutes discussed and encouraged, as a part of hypertension management. The importance of attaining a healthy weight is also discussed.  

## 2012-10-09 LAB — LIPID PANEL
LDL Cholesterol: 58 mg/dL (ref 0–99)
Triglycerides: 85 mg/dL (ref <150)
VLDL: 17 mg/dL (ref 0–40)

## 2012-10-09 LAB — BASIC METABOLIC PANEL
CO2: 26 mEq/L (ref 19–32)
Glucose, Bld: 103 mg/dL — ABNORMAL HIGH (ref 70–99)
Potassium: 4.1 mEq/L (ref 3.5–5.3)
Sodium: 140 mEq/L (ref 135–145)

## 2012-11-21 ENCOUNTER — Other Ambulatory Visit: Payer: Self-pay | Admitting: Family Medicine

## 2012-11-21 DIAGNOSIS — Z139 Encounter for screening, unspecified: Secondary | ICD-10-CM

## 2012-11-26 ENCOUNTER — Other Ambulatory Visit: Payer: Self-pay | Admitting: Family Medicine

## 2012-11-29 ENCOUNTER — Other Ambulatory Visit: Payer: Self-pay

## 2012-11-29 ENCOUNTER — Encounter: Payer: Self-pay | Admitting: Family Medicine

## 2012-11-29 ENCOUNTER — Ambulatory Visit (INDEPENDENT_AMBULATORY_CARE_PROVIDER_SITE_OTHER): Payer: No Typology Code available for payment source | Admitting: Family Medicine

## 2012-11-29 ENCOUNTER — Ambulatory Visit (HOSPITAL_COMMUNITY)
Admission: RE | Admit: 2012-11-29 | Discharge: 2012-11-29 | Disposition: A | Discharge status: Home / Self Care | Payer: No Typology Code available for payment source | Source: Ambulatory Visit | Attending: Family Medicine | Admitting: Family Medicine

## 2012-11-29 VITALS — BP 132/82 | HR 60 | Resp 16 | Wt 216.1 lb

## 2012-11-29 DIAGNOSIS — R7302 Impaired glucose tolerance (oral): Secondary | ICD-10-CM

## 2012-11-29 DIAGNOSIS — M7989 Other specified soft tissue disorders: Secondary | ICD-10-CM | POA: Insufficient documentation

## 2012-11-29 DIAGNOSIS — S6990XA Unspecified injury of unspecified wrist, hand and finger(s), initial encounter: Secondary | ICD-10-CM | POA: Insufficient documentation

## 2012-11-29 DIAGNOSIS — M79609 Pain in unspecified limb: Secondary | ICD-10-CM

## 2012-11-29 DIAGNOSIS — J309 Allergic rhinitis, unspecified: Secondary | ICD-10-CM

## 2012-11-29 DIAGNOSIS — K219 Gastro-esophageal reflux disease without esophagitis: Secondary | ICD-10-CM

## 2012-11-29 DIAGNOSIS — F419 Anxiety disorder, unspecified: Secondary | ICD-10-CM

## 2012-11-29 DIAGNOSIS — F411 Generalized anxiety disorder: Secondary | ICD-10-CM

## 2012-11-29 DIAGNOSIS — Z23 Encounter for immunization: Secondary | ICD-10-CM

## 2012-11-29 DIAGNOSIS — M349 Systemic sclerosis, unspecified: Secondary | ICD-10-CM | POA: Insufficient documentation

## 2012-11-29 DIAGNOSIS — R7309 Other abnormal glucose: Secondary | ICD-10-CM

## 2012-11-29 DIAGNOSIS — IMO0002 Reserved for concepts with insufficient information to code with codable children: Secondary | ICD-10-CM | POA: Insufficient documentation

## 2012-11-29 DIAGNOSIS — M329 Systemic lupus erythematosus, unspecified: Secondary | ICD-10-CM

## 2012-11-29 DIAGNOSIS — M79644 Pain in right finger(s): Secondary | ICD-10-CM

## 2012-11-29 DIAGNOSIS — I10 Essential (primary) hypertension: Secondary | ICD-10-CM

## 2012-11-29 DIAGNOSIS — E669 Obesity, unspecified: Secondary | ICD-10-CM

## 2012-11-29 MED ORDER — CEPHALEXIN 500 MG PO CAPS: 500.0000 mg | ORAL_CAPSULE | Freq: Four times a day (QID) | ORAL | Status: AC

## 2012-11-29 MED ORDER — HYDROCODONE-ACETAMINOPHEN 5-300 MG PO TABS: ORAL_TABLET | ORAL | Status: DC

## 2012-11-29 MED ORDER — HYDROCODONE-ACETAMINOPHEN 5-325 MG PO TABS: 1.0000 | ORAL_TABLET | Freq: Two times a day (BID) | ORAL | Status: DC | PRN

## 2012-11-29 MED ORDER — FLUCONAZOLE 150 MG PO TABS: ORAL_TABLET | ORAL | Status: AC

## 2012-11-29 NOTE — Patient Instructions (Addendum)
F/u  In 5 weeks, call if you need me before  Flu vaccine today  X ray of right index finger today  You are referred to orthopedic doc specializing in labs, [place] at 1:15 if no call , to see who, when and where  Keflex, fluconazole and hydrocodone sent to your pharmacy

## 2012-11-29 NOTE — Progress Notes (Signed)
  Subjective:    Patient ID: Jenna Woods, female    DOB: Feb 18, 1961, 52 y.o.   MRN: [id number]  HPI Crushed hand on the job accidentally, has not seen MD since the incident, left the ED too many people. Now comes in afraid, dose not want her case to worsen, wants best management asap No bleeding since incident, finger deformed, painful and has limited mobility Prior to this, she reports that she has been doing well, has been focused on lifestyle change to improve blood sugar and health, with success   Review of Systems ,See HPI Denies recent fever or chills. Denies sinus pressure, nasal congestion, ear pain or sore throat. Denies chest congestion, productive cough or wheezing. Denies chest pains, palpitations and leg swelling Denies abdominal pain, nausea, vomiting,diarrhea or constipation.   Denies dysuria, frequency, hesitancy or incontinence.  Denies headaches, seizures, numbness, or tingling. Denies depression, anxiety or insomnia. Denies skin break down or rash.         Objective:   Physical Exam  Patient alert and oriented and in no cardiopulmonary distress.  HEENT: No facial asymmetry, EOMI, no sinus tenderness,  oropharynx pink and moist.  Neck supple no adenopathy.  Chest: Clear to auscultation bilaterally.  CVS: S1, S2 no murmurs, no S3.  ABD: Soft non tender. Bowel sounds normal.  Ext: No edema  MS: Adequate ROM spine, shoulders, hips and knees.Right mid finger deformed like a crush injury , with limitation in movement , and tender to touch  Skin: Intact, no ulcerations or rash noted.  Psych: Good eye contact, normal affect. Memory intact tearful and  anxious and depressed appearing.  CNS: CN 2-12 intact, power, tone and sensation normal throughout.       Assessment & Plan:

## 2012-12-01 ENCOUNTER — Encounter: Payer: Self-pay | Admitting: Family Medicine

## 2012-12-01 NOTE — Assessment & Plan Note (Signed)
Controlled, no change in medication  

## 2012-12-01 NOTE — Assessment & Plan Note (Signed)
Controlled, no change in medication DASH diet and commitment to daily physical activity for a minimum of 30 minutes discussed and encouraged, as a part of hypertension management. The importance of attaining a healthy weight is also discussed.  

## 2012-12-01 NOTE — Assessment & Plan Note (Signed)
Improved, pt to continue lifestyle change to furhter imporve Patient educated about the importance of limiting  Carbohydrate intake , the need to commit to daily physical activity for a minimum of 30 minutes , and to commit weight loss. The fact that changes in all these areas will reduce or eliminate all together the development of diabetes is stressed.

## 2012-12-01 NOTE — Assessment & Plan Note (Signed)
Acute pain and reduced mobility following injury 5 days ago. Ortho specializing in hands asap, also antibiotic coverage, and xray today

## 2012-12-01 NOTE — Assessment & Plan Note (Signed)
recnt xray fo finger showed soft tissue changes associated with scleroderma, will refer for rheumatology re eval in mid  November , I discussed this with pt, she agrees, and is choosing to have medical eval after upcoming surgery next week on the damaged finger, and then a cruise planned forearly November

## 2012-12-01 NOTE — Assessment & Plan Note (Signed)
Currently increased and uncontrolled, verbalizes extreme fear of medical mismanagement, no change in med at this time

## 2012-12-01 NOTE — Assessment & Plan Note (Signed)
Unchanged. Patient re-educated about  the importance of commitment to a  minimum of 150 minutes of exercise per week. The importance of healthy food choices with portion control discussed. Encouraged to start a food diary, count calories and to consider  joining a support group. Sample diet sheets offered. Goals set by the patient for the next several months.    

## 2012-12-07 ENCOUNTER — Telehealth: Payer: Self-pay | Admitting: Family Medicine

## 2012-12-07 NOTE — Telephone Encounter (Signed)
Please advise.  Would it be correct to tell patient that she needs to contact surgeon?

## 2012-12-07 NOTE — Telephone Encounter (Signed)
Definitely needs to be in contact with the surgeon about her concerns, psl explain to her why so that she follows through and does not get the wrong impression that we are "putting her off"

## 2012-12-07 NOTE — Telephone Encounter (Signed)
Called and left message for patient with advise.

## 2012-12-14 ENCOUNTER — Telehealth: Payer: Self-pay | Admitting: Family Medicine

## 2012-12-14 ENCOUNTER — Other Ambulatory Visit: Payer: Self-pay | Admitting: Family Medicine

## 2012-12-14 MED ORDER — HYDROCODONE-ACETAMINOPHEN 5-325 MG PO TABS: ORAL_TABLET | ORAL | Status: DC

## 2012-12-14 NOTE — Telephone Encounter (Signed)
Please advise 

## 2012-12-14 NOTE — Telephone Encounter (Signed)
Multiple attempts made to contact patient about pain medicine. Messages left on home and cell #s.

## 2012-12-14 NOTE — Telephone Encounter (Signed)
pls provide info on how many pills, what pills and when collected by pt from surgeon for pain,. Call pharmacy pls

## 2012-12-14 NOTE — Telephone Encounter (Signed)
Advise I have prescribed 12 tablets for her and will be unable to prescribe anymore, she will need re eval by the surgeon if she requires more

## 2012-12-14 NOTE — Telephone Encounter (Signed)
[facility] and they confirmed that patient received Hydrocodone 5/325 #40 on 10/2 from Dr. Melvyn Novas

## 2012-12-17 NOTE — Telephone Encounter (Signed)
Patient returned call and stated that she would come and collect script today.

## 2012-12-28 ENCOUNTER — Other Ambulatory Visit: Payer: Self-pay | Admitting: Family Medicine

## 2012-12-28 MED ORDER — SCOPOLAMINE 1 MG/3DAYS TD PT72: 1.0000 | MEDICATED_PATCH | TRANSDERMAL | Status: DC

## 2012-12-28 NOTE — Telephone Encounter (Signed)
Patient is calling back about going on a cruise and needing a patch for nausea. Her return phone number is [phone].

## 2012-12-28 NOTE — Telephone Encounter (Signed)
Trans scop patch sent in please let her know

## 2012-12-28 NOTE — Telephone Encounter (Signed)
Please advise.  Patient is asking for a patch for motion sickness.  Do you want to advise otc?

## 2012-12-28 NOTE — Telephone Encounter (Signed)
Patient aware.

## 2012-12-31 ENCOUNTER — Ambulatory Visit (HOSPITAL_COMMUNITY)
Admission: RE | Admit: 2012-12-31 | Discharge: 2012-12-31 | Disposition: A | Discharge status: Home / Self Care | Payer: No Typology Code available for payment source | Source: Ambulatory Visit | Attending: Family Medicine | Admitting: Family Medicine

## 2012-12-31 DIAGNOSIS — Z1231 Encounter for screening mammogram for malignant neoplasm of breast: Secondary | ICD-10-CM | POA: Insufficient documentation

## 2012-12-31 DIAGNOSIS — Z139 Encounter for screening, unspecified: Secondary | ICD-10-CM

## 2013-01-05 ENCOUNTER — Other Ambulatory Visit: Payer: Self-pay | Admitting: Family Medicine

## 2013-01-09 ENCOUNTER — Ambulatory Visit: Payer: No Typology Code available for payment source | Attending: Orthopedic Surgery | Admitting: Occupational Therapy

## 2013-01-09 DIAGNOSIS — M25549 Pain in joints of unspecified hand: Secondary | ICD-10-CM | POA: Insufficient documentation

## 2013-01-09 DIAGNOSIS — IMO0001 Reserved for inherently not codable concepts without codable children: Secondary | ICD-10-CM | POA: Insufficient documentation

## 2013-01-24 ENCOUNTER — Other Ambulatory Visit: Payer: Self-pay | Admitting: Family Medicine

## 2013-02-04 ENCOUNTER — Telehealth: Payer: Self-pay | Admitting: Family Medicine

## 2013-02-04 NOTE — Telephone Encounter (Signed)
I spoke with pt, she needs to see ortho on 12/9 to get a return to work date that i can see before I can finish completing the form, please look out for this form and et me get  It , she understands.I will leave the form with you, Luann, until the information is in Pls also send for last OV from the ortho Doc nothing in her electronic record, name of Doc is under referrla column, I will send another msg wit the name  Dr is Melvyn Novas, Mer[facility]

## 2013-02-05 ENCOUNTER — Ambulatory Visit: Payer: No Typology Code available for payment source | Admitting: Family Medicine

## 2013-02-05 DIAGNOSIS — M359 Systemic involvement of connective tissue, unspecified: Secondary | ICD-10-CM | POA: Insufficient documentation

## 2013-02-05 DIAGNOSIS — I7300 Raynaud's syndrome without gangrene: Secondary | ICD-10-CM | POA: Insufficient documentation

## 2013-02-06 ENCOUNTER — Ambulatory Visit: Payer: No Typology Code available for payment source | Admitting: Family Medicine

## 2013-02-12 ENCOUNTER — Telehealth: Payer: Self-pay | Admitting: Family Medicine

## 2013-02-12 NOTE — Telephone Encounter (Signed)
Noted  

## 2013-02-12 NOTE — Telephone Encounter (Signed)
pls send for a copy of Dr Marney Doctor note .She wants work excuse and I need to see this Only 1 month xanaxx, did not keep recent appt , no phentermine if requested or 1 month only

## 2013-02-12 NOTE — Telephone Encounter (Signed)
Patient called and stated that she went seen by Dr. Orlan Leavens and signed the papers to have her notes released to this office and that the doctor stated that she will not work for at least 6 to  8 months because it is healing from the inside to the outside and her place of work will not let her wear the finger guard she has another appointment with Dr. Orlan Leavens 1.9.2015

## 2013-02-13 ENCOUNTER — Telehealth: Payer: Self-pay | Admitting: Family Medicine

## 2013-02-13 NOTE — Telephone Encounter (Signed)
Noted, Will wait on notes

## 2013-02-13 NOTE — Telephone Encounter (Signed)
Patient has signed a release at Dr. Tonna Corner office  for her office notes to come here

## 2013-02-13 NOTE — Telephone Encounter (Signed)
Faxed back and said that they have no release on file for patient and Jenna Woods states she pain $10 to have the records sent to [place]. She is going to call them back and let us know what they say

## 2013-02-14 NOTE — Telephone Encounter (Signed)
Office called and said when the office notes were available they will fax. See previous message they left.

## 2013-02-25 ENCOUNTER — Other Ambulatory Visit: Payer: Self-pay | Admitting: Family Medicine

## 2013-03-15 ENCOUNTER — Telehealth: Payer: Self-pay | Admitting: Family Medicine

## 2013-03-15 DIAGNOSIS — IMO0002 Reserved for concepts with insufficient information to code with codable children: Secondary | ICD-10-CM | POA: Diagnosis not present

## 2013-03-15 DIAGNOSIS — Z4789 Encounter for other orthopedic aftercare: Secondary | ICD-10-CM | POA: Diagnosis not present

## 2013-03-19 NOTE — Telephone Encounter (Signed)
This form was completed and handed to front desk staff last week. Pls call pt and let her know about it

## 2013-03-20 NOTE — Telephone Encounter (Signed)
Patient is aware that paper is ready

## 2013-03-27 ENCOUNTER — Telehealth: Payer: Self-pay | Admitting: Family Medicine

## 2013-03-27 NOTE — Telephone Encounter (Signed)
Patient has appt with surgeon tomorrow at 9:15

## 2013-03-28 ENCOUNTER — Other Ambulatory Visit: Payer: Self-pay | Admitting: Family Medicine

## 2013-03-28 DIAGNOSIS — IMO0002 Reserved for concepts with insufficient information to code with codable children: Secondary | ICD-10-CM | POA: Diagnosis not present

## 2013-03-29 ENCOUNTER — Other Ambulatory Visit: Payer: Self-pay

## 2013-03-29 MED ORDER — FLUCONAZOLE 150 MG PO TABS: 150.0000 mg | ORAL_TABLET | Freq: Once | ORAL | Status: AC

## 2013-04-01 ENCOUNTER — Other Ambulatory Visit: Payer: Self-pay | Admitting: Family Medicine

## 2013-04-02 ENCOUNTER — Encounter: Payer: Self-pay | Admitting: Family Medicine

## 2013-04-02 ENCOUNTER — Telehealth: Payer: Self-pay | Admitting: Family Medicine

## 2013-04-02 ENCOUNTER — Ambulatory Visit (INDEPENDENT_AMBULATORY_CARE_PROVIDER_SITE_OTHER): Payer: Medicare Other | Admitting: Family Medicine

## 2013-04-02 VITALS — BP 112/76 | HR 80 | Resp 18 | Ht 66.0 in | Wt 229.1 lb

## 2013-04-02 DIAGNOSIS — F419 Anxiety disorder, unspecified: Secondary | ICD-10-CM

## 2013-04-02 DIAGNOSIS — F341 Dysthymic disorder: Secondary | ICD-10-CM

## 2013-04-02 DIAGNOSIS — S6990XA Unspecified injury of unspecified wrist, hand and finger(s), initial encounter: Secondary | ICD-10-CM

## 2013-04-02 DIAGNOSIS — R32 Unspecified urinary incontinence: Secondary | ICD-10-CM

## 2013-04-02 DIAGNOSIS — E669 Obesity, unspecified: Secondary | ICD-10-CM

## 2013-04-02 DIAGNOSIS — R7309 Other abnormal glucose: Secondary | ICD-10-CM

## 2013-04-02 DIAGNOSIS — K219 Gastro-esophageal reflux disease without esophagitis: Secondary | ICD-10-CM

## 2013-04-02 DIAGNOSIS — J309 Allergic rhinitis, unspecified: Secondary | ICD-10-CM

## 2013-04-02 DIAGNOSIS — F411 Generalized anxiety disorder: Secondary | ICD-10-CM

## 2013-04-02 DIAGNOSIS — R7302 Impaired glucose tolerance (oral): Secondary | ICD-10-CM

## 2013-04-02 DIAGNOSIS — S6980XA Other specified injuries of unspecified wrist, hand and finger(s), initial encounter: Secondary | ICD-10-CM

## 2013-04-02 DIAGNOSIS — F418 Other specified anxiety disorders: Secondary | ICD-10-CM | POA: Insufficient documentation

## 2013-04-02 DIAGNOSIS — I10 Essential (primary) hypertension: Secondary | ICD-10-CM

## 2013-04-02 LAB — HEMOGLOBIN A1C
Hgb A1c MFr Bld: 5.8 % — ABNORMAL HIGH (ref <5.7)
MEAN PLASMA GLUCOSE: 120 mg/dL — AB (ref <117)

## 2013-04-02 MED ORDER — ALPRAZOLAM 1 MG PO TABS: 1.0000 mg | ORAL_TABLET | Freq: Three times a day (TID) | ORAL | Status: DC

## 2013-04-02 MED ORDER — VENLAFAXINE HCL ER 37.5 MG PO CP24: 37.5000 mg | ORAL_CAPSULE | Freq: Every day | ORAL | Status: DC

## 2013-04-02 MED ORDER — SERTRALINE HCL 25 MG PO TABS: 25.0000 mg | ORAL_TABLET | Freq: Every day | ORAL | Status: DC

## 2013-04-02 MED ORDER — PHENTERMINE HCL 37.5 MG PO TABS: 37.5000 mg | ORAL_TABLET | Freq: Every day | ORAL | Status: DC

## 2013-04-02 NOTE — Patient Instructions (Addendum)
F/u in 3 month HBa1C today  New for depression is effexor one daily and you are referred to therapist at [facility]  Xanax as before  I hope that you do well with the upcoming surgery  Pls sign for notes from rheumatology visit in December  It is important that you exercise regularly at least 30 minutes 5 times a week. If you develop chest pain, have severe difficulty breathing, or feel very tired, stop exercising immediately and seek medical attention   Weight loss goal  Of 3 pounds per month  Hope  Your stress lessens over time, I am sure that it will, and CONGRATS on engagement!

## 2013-04-02 NOTE — Progress Notes (Signed)
Subjective:    Patient ID: Jenna Woods, female    DOB: [birth year], 53 y.o.   MRN: [id number]  HPI  Pt in for follow up. She has upcoming surgery this Thursday in 2 days for further partial amputation of the finger which she injures on the job last Fall. Has been under a lot of stress her Mom has been very ill so still needs more time out of work.Very  Depressed about her Mom. Now engaged, states she was the only one of the  6 living kids who is looking after her mom, and she is upset aboput this. States she was fit for mental hospital. Interested in therapy and any needed help for her mental state, as she feels overwhelmed and is depressed.Not suicidal or homicidal, but has overwhelming sense of worthlessness, no matter what she does Saw rheumatology in December no further appts, however, she is unclear as to test results and ask that I look into this     Review of Systems See HPI Denies recent fever or chills. Denies sinus pressure, nasal congestion, ear pain or sore throat. Denies chest congestion, productive cough or wheezing. Denies chest pains, palpitations and leg swelling Denies abdominal pain, nausea, vomiting,diarrhea or constipation.   Denies dysuria, frequency, hesitancy or uncontrolled  incontinence. Denies headaches, seizures, numbness, or tingling.  Denies skin break down or rash.        Objective:   Physical Exam BP 112/76  Pulse 80  Resp 18  Ht 5\' 6"  (1.676 m)  Wt 229 lb 1.3 oz (103.91 kg)  BMI 36.99 kg/m2  SpO2 99% Patient alert and oriented and in no cardiopulmonary distress.Pt anxiouis , tearful and depressed  HEENT: No facial asymmetry, EOMI, no sinus tenderness,  oropharynx pink and moist.  Neck supple no adenopathy.  Chest: Clear to auscultation bilaterally.  CVS: S1, S2 no murmurs, no S3.  ABD: Soft non tender. Bowel sounds normal.  Ext: No edema  MS: Adequate ROM spine, shoulders, hips and knees.  Skin:open surgical wound , no erythema  or drainage, in area of previous surgery of affected finger  Psych: Good eye contact, anxious and  depressed appearing.  CNS: CN 2-12 intact, power, tone and sensation normal throughout.        Assessment & Plan:  Depression with anxiety Pt started on effexor and referred to therapist, she may also need to see psychiatrist, depending on her response to medication and counselling  Urinary incontinence Improved and conbtrolled on current med  Anxiety Increased and uncontrolled , due to ill health of her mother and family infighting, feels as if all the responsibility of caring for her mother is being left to her  IGT (impaired glucose tolerance) Improved Patient educated about the importance of limiting  Carbohydrate intake , the need to commit to daily physical activity for a minimum of 30 minutes , and to commit weight loss. The fact that changes in all these areas will reduce or eliminate all together the development of diabetes is stressed.     Hypertension Controlled, no change in medication DASH diet and commitment to daily physical activity for a minimum of 30 minutes discussed and encouraged, as a part of hypertension management. The importance of attaining a healthy weight is also discussed.   OBESITY Deteriorated. Patient re-educated about  the importance of commitment to a  minimum of 150 minutes of exercise per week. The importance of healthy food choices with portion control discussed. Encouraged to start a food diary,  count calories and to consider  joining a support group. Sample diet sheets offered. Goals set by the patient for the next several months.     Finger injury Has upcoming surgery this week again, finger not healing as was expected, followingg traumatic injury on the job approx 4 months ago. Out of work since the incident  GERD (gastroesophageal reflux disease) Controlled, no change in medication   ALLERGIC RHINITIS CAUSE  UNSPECIFIED Controlled, no change in medication

## 2013-04-02 NOTE — Telephone Encounter (Signed)
Her medication was faxed at her office visit and then refaxed again after this message was received

## 2013-04-03 ENCOUNTER — Encounter: Payer: Self-pay | Admitting: Family Medicine

## 2013-04-04 ENCOUNTER — Telehealth: Payer: Self-pay | Admitting: Family Medicine

## 2013-04-04 ENCOUNTER — Telehealth: Payer: Self-pay

## 2013-04-04 DIAGNOSIS — S090XXS Injury of blood vessels of head, not elsewhere classified, sequela: Secondary | ICD-10-CM | POA: Diagnosis not present

## 2013-04-04 DIAGNOSIS — S55909S Unspecified injury of unspecified blood vessel at forearm level, unspecified arm, sequela: Secondary | ICD-10-CM | POA: Diagnosis not present

## 2013-04-04 DIAGNOSIS — IMO0002 Reserved for concepts with insufficient information to code with codable children: Secondary | ICD-10-CM | POA: Diagnosis not present

## 2013-04-04 DIAGNOSIS — L98499 Non-pressure chronic ulcer of skin of other sites with unspecified severity: Secondary | ICD-10-CM | POA: Diagnosis not present

## 2013-04-04 DIAGNOSIS — X58XXXS Exposure to other specified factors, sequela: Secondary | ICD-10-CM | POA: Diagnosis not present

## 2013-04-04 DIAGNOSIS — X58XXXA Exposure to other specified factors, initial encounter: Secondary | ICD-10-CM | POA: Diagnosis not present

## 2013-04-04 NOTE — Telephone Encounter (Signed)
Yes will continue with both, pls let pharmacist know

## 2013-04-04 NOTE — Telephone Encounter (Signed)
Spoke directly with pharmacist to clarify and give the go ahead for the effexor with the phentermine , as I have not had a problem with this. I am aware of possibility of serootonin syndrome and  Asked the pharmacist to counsel the pt about this.

## 2013-04-09 ENCOUNTER — Telehealth: Payer: Self-pay | Admitting: Family Medicine

## 2013-04-09 NOTE — Telephone Encounter (Signed)
Pls contact pt and let her know that I did get a response from the rheumatologist. They have tried to contact her re the labs , some of which are not normal. She needs to contact the office , if she has not been able to spk with Doc or make an arrangement for a return visit  For f/u of labs if she/ the Doc prefer that

## 2013-04-11 NOTE — Telephone Encounter (Signed)
Patient aware and states that she will contact the office.  She has been preoccupied with caring for her mother.

## 2013-04-14 NOTE — Assessment & Plan Note (Signed)
Deteriorated. Patient re-educated about  the importance of commitment to a  minimum of 150 minutes of exercise per week. The importance of healthy food choices with portion control discussed. Encouraged to start a food diary, count calories and to consider  joining a support group. Sample diet sheets offered. Goals set by the patient for the next several months.    

## 2013-04-14 NOTE — Assessment & Plan Note (Signed)
Controlled, no change in medication  

## 2013-04-14 NOTE — Assessment & Plan Note (Signed)
Controlled, no change in medication DASH diet and commitment to daily physical activity for a minimum of 30 minutes discussed and encouraged, as a part of hypertension management. The importance of attaining a healthy weight is also discussed.  

## 2013-04-14 NOTE — Assessment & Plan Note (Signed)
Pt started on effexor and referred to therapist, she may also need to see psychiatrist, depending on her response to medication and counselling

## 2013-04-14 NOTE — Assessment & Plan Note (Signed)
Improved Patient educated about the importance of limiting  Carbohydrate intake , the need to commit to daily physical activity for a minimum of 30 minutes , and to commit weight loss. The fact that changes in all these areas will reduce or eliminate all together the development of diabetes is stressed.

## 2013-04-14 NOTE — Assessment & Plan Note (Signed)
Increased and uncontrolled , due to ill health of her mother and family infighting, feels as if all the responsibility of caring for her mother is being left to her

## 2013-04-14 NOTE — Assessment & Plan Note (Addendum)
Has upcoming surgery this week again, finger not healing as was expected, followingg traumatic injury on the job approx 4 months ago. Out of work since the incident

## 2013-04-14 NOTE — Assessment & Plan Note (Signed)
Improved and conbtrolled on current med

## 2013-04-19 ENCOUNTER — Other Ambulatory Visit: Payer: Self-pay

## 2013-04-19 DIAGNOSIS — J189 Pneumonia, unspecified organism: Secondary | ICD-10-CM

## 2013-04-19 MED ORDER — FLUCONAZOLE 150 MG PO TABS: ORAL_TABLET | ORAL | Status: AC

## 2013-04-20 ENCOUNTER — Other Ambulatory Visit: Payer: Self-pay | Admitting: Family Medicine

## 2013-04-26 ENCOUNTER — Telehealth (HOSPITAL_COMMUNITY): Payer: Self-pay | Admitting: *Deleted

## 2013-04-29 ENCOUNTER — Telehealth (HOSPITAL_COMMUNITY): Payer: Self-pay | Admitting: *Deleted

## 2013-05-30 DIAGNOSIS — IMO0002 Reserved for concepts with insufficient information to code with codable children: Secondary | ICD-10-CM | POA: Diagnosis not present

## 2013-06-06 ENCOUNTER — Other Ambulatory Visit: Payer: Self-pay | Admitting: Family Medicine

## 2013-07-09 DIAGNOSIS — Z0279 Encounter for issue of other medical certificate: Secondary | ICD-10-CM

## 2013-07-10 ENCOUNTER — Other Ambulatory Visit: Payer: Self-pay | Admitting: Family Medicine

## 2013-07-25 DIAGNOSIS — Z5189 Encounter for other specified aftercare: Secondary | ICD-10-CM | POA: Diagnosis not present

## 2013-08-01 ENCOUNTER — Other Ambulatory Visit: Payer: Self-pay | Admitting: Family Medicine

## 2013-08-05 ENCOUNTER — Ambulatory Visit (INDEPENDENT_AMBULATORY_CARE_PROVIDER_SITE_OTHER): Payer: Medicare Other | Admitting: Family Medicine

## 2013-08-05 ENCOUNTER — Encounter: Payer: Self-pay | Admitting: Family Medicine

## 2013-08-05 VITALS — BP 120/84 | HR 80 | Resp 16 | Wt 230.1 lb

## 2013-08-05 DIAGNOSIS — R7301 Impaired fasting glucose: Secondary | ICD-10-CM | POA: Diagnosis not present

## 2013-08-05 DIAGNOSIS — IMO0002 Reserved for concepts with insufficient information to code with codable children: Secondary | ICD-10-CM | POA: Diagnosis not present

## 2013-08-05 DIAGNOSIS — R5383 Other fatigue: Secondary | ICD-10-CM

## 2013-08-05 DIAGNOSIS — R7302 Impaired glucose tolerance (oral): Secondary | ICD-10-CM

## 2013-08-05 DIAGNOSIS — K219 Gastro-esophageal reflux disease without esophagitis: Secondary | ICD-10-CM

## 2013-08-05 DIAGNOSIS — M1712 Unilateral primary osteoarthritis, left knee: Secondary | ICD-10-CM

## 2013-08-05 DIAGNOSIS — N951 Menopausal and female climacteric states: Secondary | ICD-10-CM

## 2013-08-05 DIAGNOSIS — R5381 Other malaise: Secondary | ICD-10-CM | POA: Diagnosis not present

## 2013-08-05 DIAGNOSIS — F341 Dysthymic disorder: Secondary | ICD-10-CM

## 2013-08-05 DIAGNOSIS — M1710 Unilateral primary osteoarthritis, unspecified knee: Secondary | ICD-10-CM | POA: Diagnosis not present

## 2013-08-05 DIAGNOSIS — I10 Essential (primary) hypertension: Secondary | ICD-10-CM

## 2013-08-05 DIAGNOSIS — R7309 Other abnormal glucose: Secondary | ICD-10-CM

## 2013-08-05 DIAGNOSIS — E559 Vitamin D deficiency, unspecified: Secondary | ICD-10-CM

## 2013-08-05 DIAGNOSIS — E669 Obesity, unspecified: Secondary | ICD-10-CM

## 2013-08-05 DIAGNOSIS — F418 Other specified anxiety disorders: Secondary | ICD-10-CM

## 2013-08-05 MED ORDER — IBUPROFEN 800 MG PO TABS: 800.0000 mg | ORAL_TABLET | Freq: Three times a day (TID) | ORAL | Status: DC | PRN

## 2013-08-05 MED ORDER — PHENTERMINE HCL 37.5 MG PO TABS: 37.5000 mg | ORAL_TABLET | Freq: Every day | ORAL | Status: DC

## 2013-08-05 MED ORDER — VENLAFAXINE HCL ER 37.5 MG PO CP24: 37.5000 mg | ORAL_CAPSULE | Freq: Every day | ORAL | Status: DC

## 2013-08-05 MED ORDER — METHYLPREDNISOLONE ACETATE 80 MG/ML IJ SUSP
80.0000 mg | Freq: Once | INTRAMUSCULAR | Status: AC
  Administered 2013-08-05: 80 mg via INTRAMUSCULAR

## 2013-08-05 MED ORDER — ESOMEPRAZOLE MAGNESIUM 40 MG PO CPDR: DELAYED_RELEASE_CAPSULE | ORAL | Status: DC

## 2013-08-05 MED ORDER — KETOROLAC TROMETHAMINE 60 MG/2ML IM SOLN
60.0000 mg | Freq: Once | INTRAMUSCULAR | Status: AC
  Administered 2013-08-05: 60 mg via INTRAMUSCULAR

## 2013-08-05 MED ORDER — PREDNISONE 5 MG PO TABS: 5.0000 mg | ORAL_TABLET | Freq: Two times a day (BID) | ORAL | Status: AC

## 2013-08-05 NOTE — Patient Instructions (Signed)
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

## 2013-08-06 LAB — HEMOGLOBIN A1C
Hgb A1c MFr Bld: 6 % — ABNORMAL HIGH (ref <5.7)
MEAN PLASMA GLUCOSE: 126 mg/dL — AB (ref <117)

## 2013-08-06 LAB — BASIC METABOLIC PANEL
BUN: 10 mg/dL (ref 6–23)
CHLORIDE: 106 meq/L (ref 96–112)
CO2: 27 meq/L (ref 19–32)
CREATININE: 0.68 mg/dL (ref 0.50–1.10)
Calcium: 9.3 mg/dL (ref 8.4–10.5)
GLUCOSE: 93 mg/dL (ref 70–99)
POTASSIUM: 4.3 meq/L (ref 3.5–5.3)
Sodium: 138 mEq/L (ref 135–145)

## 2013-08-06 LAB — VITAMIN D 25 HYDROXY (VIT D DEFICIENCY, FRACTURES): Vit D, 25-Hydroxy: 35 ng/mL (ref 30–89)

## 2013-08-06 LAB — TSH: TSH: 0.93 u[IU]/mL (ref 0.350–4.500)

## 2013-08-18 DIAGNOSIS — N951 Menopausal and female climacteric states: Secondary | ICD-10-CM | POA: Insufficient documentation

## 2013-08-18 NOTE — Assessment & Plan Note (Signed)
Unchnaged. Patient re-educated about  the importance of commitment to a  minimum of 150 minutes of exercise per week. The importance of healthy food choices with portion control discussed. Encouraged to start a food diary, count calories and to consider  joining a support group. Sample diet sheets offered. Goals set by the patient for the next several months.    

## 2013-08-18 NOTE — Assessment & Plan Note (Signed)
impoved but still out of control, since her Mother's condition may be worsening , and is certainly not improving Will start effexor for hot flashes and this should be of benefit to her mental health also Resumption of daily walking also encouraged

## 2013-08-18 NOTE — Assessment & Plan Note (Signed)
Increase in symptom severity of hot flashes. General pt ed discussed and she is to start effexor also

## 2013-08-18 NOTE — Assessment & Plan Note (Signed)
Deteriorated Patient educated about the importance of limiting  Carbohydrate intake , the need to commit to daily physical activity for a minimum of 30 minutes , and to commit weight loss. The fact that changes in all these areas will reduce or eliminate all together the development of diabetes is stressed.    

## 2013-08-18 NOTE — Assessment & Plan Note (Signed)
Controlled, no change in medication DASH diet and commitment to daily physical activity for a minimum of 30 minutes discussed and encouraged, as a part of hypertension management. The importance of attaining a healthy weight is also discussed.  

## 2013-08-18 NOTE — Progress Notes (Signed)
Subjective:    Patient ID: Jenna Woods, female    DOB: [birth year], 53 y.o.   MRN: [id number]  HPI The PT is here for follow up and re-evaluation of chronic medical conditions, medication management and review of any available recent lab and radiology data.  Preventive health is updated, specifically  Cancer screening and Immunization.    The PT denies any adverse reactions to current medications since the last visit.  2 week h/o increased knee pain and swelling, no direct trauma incited this, at times pain causes some instability, no falls Increased and uncontrolled anxiety an also mild depression, not suicidal or homicidal, mainly due to her Mom's health and the fact tat she continues to feel as though she is taking the brunt of the responsibility of care for her Mom since her other 2 sisters work      Review of Systems See HPI Denies recent fever or chills. Denies sinus pressure, nasal congestion, ear pain or sore throat. Denies chest congestion, productive cough or wheezing. Denies chest pains, palpitations and leg swelling Denies abdominal pain, nausea, vomiting,diarrhea or constipation.   Denies dysuria, frequency, hesitancy or incontinence.  Denies headaches, seizures, numbness, or tingling. Denies skin break down or rash.        Objective:   Physical Exam  BP 120/84  Pulse 80  Resp 16  Wt 230 lb 1.9 oz (104.382 kg) Patient alert and oriented and in no cardiopulmonary distress.Pt in pain  HEENT: No facial asymmetry, EOMI,   oropharynx pink and moist.  Neck supple no JVD, no mass.  Chest: Clear to auscultation bilaterally.  CVS: S1, S2 no murmurs, no S3.  ABD: Soft non tender.   Ext: No edema  MS: Adequate ROM spine shoulders, hips and right  Knee. Swelling, tenderness , warmth and crepitus in left knee with reduced mobility  Skin: Intact, no ulcerations or rash noted.  Psych: Good eye contact, normal affect. Memory intact not anxious or depressed  appearing.  CNS: CN 2-12 intact, power,  normal throughout.no focal deficits noted.       Assessment & Plan:  Osteoarthritis of left knee 2 week flare of pain and swelling, anti inflammatories in office , then oral meds to follow. Weight loss encouraged to reduce rate of deterioration of joint  OBESITY Unchnaged Patient re-educated about  the importance of commitment to a  minimum of 150 minutes of exercise per week. The importance of healthy food choices with portion control discussed. Encouraged to start a food diary, count calories and to consider  joining a support group. Sample diet sheets offered. Goals set by the patient for the next several months.     Depression with anxiety impoved but still out of control, since her Mother's condition may be worsening , and is certainly not improving Will start effexor for hot flashes and this should be of benefit to her mental health also Resumption of daily walking also encouraged  IGT (impaired glucose tolerance) Deteriorated Patient educated about the importance of limiting  Carbohydrate intake , the need to commit to daily physical activity for a minimum of 30 minutes , and to commit weight loss. The fact that changes in all these areas will reduce or eliminate all together the development of diabetes is stressed.     Hypertension Controlled, no change in medication DASH diet and commitment to daily physical activity for a minimum of 30 minutes discussed and encouraged, as a part of hypertension management. The importance of attaining a  healthy weight is also discussed.   GERD (gastroesophageal reflux disease) Controlled, no change in medication   Hot flashes, menopausal Increase in symptom severity of hot flashes. General pt ed discussed and she is to start effexor also

## 2013-08-18 NOTE — Assessment & Plan Note (Signed)
Controlled, no change in medication  

## 2013-08-18 NOTE — Assessment & Plan Note (Signed)
2 week flare of pain and swelling, anti inflammatories in office , then oral meds to follow. Weight loss encouraged to reduce rate of deterioration of joint

## 2013-09-21 ENCOUNTER — Encounter (HOSPITAL_COMMUNITY): Payer: Self-pay | Admitting: Emergency Medicine

## 2013-09-21 ENCOUNTER — Emergency Department (HOSPITAL_COMMUNITY)
Admission: EM | Admit: 2013-09-21 | Discharge: 2013-09-21 | Disposition: A | Discharge status: Home / Self Care | Payer: Medicare Other | Attending: Emergency Medicine | Admitting: Emergency Medicine

## 2013-09-21 DIAGNOSIS — F329 Major depressive disorder, single episode, unspecified: Secondary | ICD-10-CM | POA: Insufficient documentation

## 2013-09-21 DIAGNOSIS — E669 Obesity, unspecified: Secondary | ICD-10-CM | POA: Insufficient documentation

## 2013-09-21 DIAGNOSIS — Z79899 Other long term (current) drug therapy: Secondary | ICD-10-CM | POA: Insufficient documentation

## 2013-09-21 DIAGNOSIS — M436 Torticollis: Secondary | ICD-10-CM | POA: Insufficient documentation

## 2013-09-21 DIAGNOSIS — I10 Essential (primary) hypertension: Secondary | ICD-10-CM | POA: Diagnosis not present

## 2013-09-21 DIAGNOSIS — K219 Gastro-esophageal reflux disease without esophagitis: Secondary | ICD-10-CM | POA: Insufficient documentation

## 2013-09-21 DIAGNOSIS — M25519 Pain in unspecified shoulder: Secondary | ICD-10-CM | POA: Insufficient documentation

## 2013-09-21 DIAGNOSIS — Z87891 Personal history of nicotine dependence: Secondary | ICD-10-CM | POA: Insufficient documentation

## 2013-09-21 DIAGNOSIS — F411 Generalized anxiety disorder: Secondary | ICD-10-CM | POA: Diagnosis not present

## 2013-09-21 DIAGNOSIS — F3289 Other specified depressive episodes: Secondary | ICD-10-CM | POA: Insufficient documentation

## 2013-09-21 DIAGNOSIS — M542 Cervicalgia: Secondary | ICD-10-CM | POA: Diagnosis present

## 2013-09-21 MED ORDER — CYCLOBENZAPRINE HCL 10 MG PO TABS: 10.0000 mg | ORAL_TABLET | Freq: Three times a day (TID) | ORAL | Status: DC | PRN

## 2013-09-21 MED ORDER — HYDROCODONE-ACETAMINOPHEN 5-325 MG PO TABS
1.0000 | ORAL_TABLET | Freq: Once | ORAL | Status: AC
  Administered 2013-09-21: 1 via ORAL
  Filled 2013-09-21: qty 1

## 2013-09-21 MED ORDER — CYCLOBENZAPRINE HCL 10 MG PO TABS
10.0000 mg | ORAL_TABLET | Freq: Once | ORAL | Status: AC
  Administered 2013-09-21: 10 mg via ORAL
  Filled 2013-09-21: qty 1

## 2013-09-21 MED ORDER — HYDROCODONE-ACETAMINOPHEN 5-325 MG PO TABS: ORAL_TABLET | ORAL | Status: DC

## 2013-09-21 NOTE — ED Notes (Signed)
Patient with no complaints at this time. Respirations even and unlabored. Skin warm/dry. Discharge instructions reviewed with patient at this time. Patient given opportunity to voice concerns/ask questions. Patient discharged at this time and left Emergency Department with steady gait.   

## 2013-09-21 NOTE — ED Notes (Signed)
Pt c/o left shoulder pain that is worse with movement of left shoulder and turning her head to the left. Cms intact distal, denies any injury.

## 2013-09-21 NOTE — ED Notes (Signed)
Woke one morning this week w/L neck and shoulder stiffness.  Pain has progressively worsened throughout week.  Has been using ibuprofen and heat w/out much relief.

## 2013-09-21 NOTE — Discharge Instructions (Signed)

## 2013-09-22 NOTE — ED Provider Notes (Signed)
CSN: [id number]     Arrival date & time 09/21/13  1337 History   First MD Initiated Contact with Patient 09/21/13 1420     Chief Complaint  Patient presents with  . Shoulder Pain     (Consider location/radiation/quality/duration/timing/severity/associated sxs/prior Treatment) Patient is a 53 y.o. female presenting with shoulder pain. The history is provided by the patient.  Shoulder Pain This is a new problem. The current episode started in the past 7 days. The problem occurs constantly. The problem has been unchanged. Associated symptoms include arthralgias, myalgias and neck pain. Pertinent negatives include no abdominal pain, chest pain, chills, fever, headaches, joint swelling, numbness, rash, sore throat, swollen glands, urinary symptoms, vertigo, visual change, vomiting or weakness. The symptoms are aggravated by twisting. She has tried NSAIDs and heat for the symptoms. The treatment provided mild relief.   Jenna Woods is a 53 y.o. female who presents to the Emergency Department complaining of left neck and shoulder pain for several days.  She states that she has been under increased stress recently and that she was sleeping in a chair in the hospital last week while staying with her elderly mother who died last week.  She states the pain is worse with movement of her head and left arm.  She denies chest pain, shortness of breath, fever, chills, numbness or weakness of the extremities,  or facial weakness.   Past Medical History  Diagnosis Date  . Hypertension 1996  . Depression with anxiety   . GERD (gastroesophageal reflux disease)   . Scleroderma 1998  . Obesity   . Systemic lupus erythematosus 1998    T[facility] at [facility]  . Tobacco abuse, in remission     10-pack-years discontinued in 2007  . Anxiety   . Allergic reaction   . Urinary frequency   . Peripheral vascular disease   . Depression     h/o suicidal ideation in 2011  . Prediabetes 2013  . Allergy     perrenial     Past Surgical History  Procedure Laterality Date  . Finger amputation      Third finger, bilateral, distal  . Abdominal hysterectomy  1990    Neoplasm  . Cesarean section      x2  . Colonoscopy  2005  . Vascular surgery      Hands, bilaterally, [facility]  . Nasal sinus surgery  09/06/2011    Procedure: ENDOSCOPIC SINUS SURGERY;  Surgeon: Ascencion Dike, MD;  Location: [facility];  Service: ENT;  Laterality: N/A;  Nasal cerebrospinal fluid leak repair with Left temporalis fascia graft and Left Ear cartilage graft   Family History  Problem Relation Age of Onset  . Arthritis Mother     Rheumatoid  . Dementia Mother   . Hypertension Mother   . Cirrhosis Father   . Alcohol abuse Father   . Cancer Brother     lymph node   . Heart disease Brother    History  Substance Use Topics  . Smoking status: Former Smoker    Quit date: 10/05/2009  . Smokeless tobacco: Not on file  . Alcohol Use: No     Comment: quit in 2004   OB History   Grav Para Term Preterm Abortions TAB SAB Ect Mult Living                 Review of Systems  Constitutional: Negative for fever and chills.  HENT: Negative for sore throat.   Cardiovascular: Negative  for chest pain.  Gastrointestinal: Negative for vomiting and abdominal pain.  Genitourinary: Negative for dysuria and difficulty urinating.  Musculoskeletal: Positive for arthralgias, myalgias and neck pain. Negative for back pain and joint swelling.  Skin: Negative for color change, rash and wound.  Neurological: Negative for dizziness, vertigo, syncope, facial asymmetry, speech difficulty, weakness, numbness and headaches.  All other systems reviewed and are negative.     Allergies  Sesame oil; Molds & smuts; Codeine; and Nutritional supplements  Home Medications   Prior to Admission medications   Medication Sig Start Date End Date Taking? Authorizing Provider  ALPRAZolam Duanne Moron) 1 MG tablet TAKE ONE (1) TABLET THREE (3)  TIMES EACH DAY   Yes Fayrene Helper, MD  amLODipine (NORVASC) 10 MG tablet TAKE ONE (1) TABLET BY MOUTH EVERY DAY 07/10/13  Yes Fayrene Helper, MD  Calcium Carbonate-Vit D-Min (CALCIUM 1200) 1200-1000 MG-UNIT CHEW Chew 1 tablet by mouth daily.   Yes Historical Provider, MD  esomeprazole (NEXIUM) 40 MG capsule TAKE ONE CAPSULE BY MOUTH DAILY BEFORE BREAKFAST 08/05/13  Yes Fayrene Helper, MD  NASONEX 50 MCG/ACT nasal spray USE 2 SPRAYS IN EACH NOSTRIL DAILY   Yes Fayrene Helper, MD  oxybutynin (DITROPAN-XL) 5 MG 24 hr tablet TAKE ONE (1) TABLET EACH DAY   Yes Fayrene Helper, MD  venlafaxine XR (EFFEXOR-XR) 37.5 MG 24 hr capsule Take 1 capsule (37.5 mg total) by mouth daily with breakfast. 08/05/13  Yes Fayrene Helper, MD  cyclobenzaprine (FLEXERIL) 10 MG tablet Take 1 tablet (10 mg total) by mouth 3 (three) times daily as needed. 09/21/13   Meesha Sek L. Waino Mounsey, PA-C  HYDROcodone-acetaminophen (NORCO/VICODIN) 5-325 MG per tablet Take one-two tabs po q 4-6 hrs prn pain 09/21/13   Meagen Limones L. Demauri Advincula, PA-C   BP 140/85  Pulse 102  Temp(Src) 98.2 F (36.8 C) (Oral)  Resp 20  Ht 5\' 8"  (1.727 m)  Wt 198 lb (89.812 kg)  BMI 30.11 kg/m2  SpO2 97% Physical Exam  Nursing note and vitals reviewed. Constitutional: She is oriented to person, place, and time. She appears well-developed and well-nourished. No distress.  HENT:  Head: Normocephalic and atraumatic.  Mouth/Throat: Oropharynx is clear and moist.  Eyes: EOM are normal. Pupils are equal, round, and reactive to light.  Neck: Phonation normal. Muscular tenderness present. No spinous process tenderness present. No rigidity. Decreased range of motion present. No erythema present. No Brudzinski's sign and no Kernig's sign noted. No thyromegaly present.  ttp of the left cervical paraspinal muscles and along the left trapezius muscle.  Grip strength is strong and equal bilaterally.  Distal sensation intact,  CR < 2 sec.  Pain to the left neck  is reproduced with rotation and palpation.    Cardiovascular: Normal rate, regular rhythm, normal heart sounds and intact distal pulses.   No murmur heard. Pulmonary/Chest: Effort normal and breath sounds normal. No respiratory distress. She exhibits no tenderness.  Musculoskeletal: She exhibits tenderness. She exhibits no edema.       Cervical back: She exhibits tenderness. She exhibits normal range of motion, no bony tenderness, no swelling, no deformity, no spasm and normal pulse.  See neck exam  Lymphadenopathy:    She has no cervical adenopathy.  Neurological: She is alert and oriented to person, place, and time. She has normal strength. No sensory deficit. She exhibits normal muscle tone. Coordination normal.  Reflex Scores:      Tricep reflexes are 2+ on the right side and 2+  on the left side.      Bicep reflexes are 2+ on the right side and 2+ on the left side. Skin: Skin is warm and dry.    ED Course  Procedures (including critical care time) Labs Review Labs Reviewed - No data to display  Imaging Review No results found.   EKG Interpretation None      MDM   Final diagnoses:  Torticollis    Pain to left neck is reproduced with palpation and neck movement.  No focal neuro deficits, pt denies other sx's.  She is well appearing and VSS.  She agrees to symptomatic treatment with heat/ice, flexeril and vicodin.  i have also advised her to return here for any worsening symptoms and she agrees to plan and appears stable for d/c    Raif Chachere L. Van[place], PA-C 09/22/13 1822

## 2013-09-23 NOTE — ED Provider Notes (Signed)
History/physical exam/procedure(s) were performed by non-physician practitioner and as supervising physician I was immediately available for consultation/collaboration. I have reviewed all notes and am in agreement with care and plan.   Shaune Pollack, MD 09/23/13 [phone]

## 2013-09-24 DIAGNOSIS — Z5189 Encounter for other specified aftercare: Secondary | ICD-10-CM | POA: Diagnosis not present

## 2013-10-02 ENCOUNTER — Ambulatory Visit (INDEPENDENT_AMBULATORY_CARE_PROVIDER_SITE_OTHER): Payer: Medicare Other | Admitting: Adult Health

## 2013-10-02 ENCOUNTER — Encounter: Payer: Self-pay | Admitting: Adult Health

## 2013-10-02 VITALS — BP 110/90 | HR 76 | Ht 64.0 in | Wt 228.5 lb

## 2013-10-02 DIAGNOSIS — N951 Menopausal and female climacteric states: Secondary | ICD-10-CM

## 2013-10-02 DIAGNOSIS — Z01419 Encounter for gynecological examination (general) (routine) without abnormal findings: Secondary | ICD-10-CM | POA: Diagnosis not present

## 2013-10-02 NOTE — Progress Notes (Signed)
Patient ID: Jenna Woods, female   DOB: 10-Jan-1961, 53 y.o.   MRN: [id number] History of Present Illness: Jenna Woods is a 53 year old black female, engaged in for a gyn physical and she is complaining of hot flashes and Dr Moshe Cipro has placed her on Effexor.Her Mom died in 05-Oct-2022. She also had right 3rd finger amputated this year after injury at work.  Current Medications, Allergies, Past Medical History, Past Surgical History, Family History and Social History were reviewed in [employer] record.   Past Medical History  Diagnosis Date  . Hypertension 1996  . Depression with anxiety   . GERD (gastroesophageal reflux disease)   . Scleroderma 1998  . Obesity   . Systemic lupus erythematosus 1998    Treated at [facility]  . Tobacco abuse, in remission     10-pack-years discontinued in 2007  . Anxiety   . Allergic reaction   . Urinary frequency   . Peripheral vascular disease   . Depression     h/o suicidal ideation in 2011  . Prediabetes 2013  . Allergy     perrenial   Current outpatient prescriptions:ALPRAZolam (XANAX) 1 MG tablet, TAKE ONE (1) TABLET THREE (3) TIMES EACH DAY, Disp: 90 tablet, Rfl: 3;  amLODipine (NORVASC) 10 MG tablet, TAKE ONE (1) TABLET BY MOUTH EVERY DAY, Disp: 30 tablet, Rfl: 2;  Calcium Carbonate-Vit D-Min (CALCIUM 1200) 1200-1000 MG-UNIT CHEW, Chew 1 tablet by mouth daily., Disp: , Rfl:  cyclobenzaprine (FLEXERIL) 10 MG tablet, Take 1 tablet (10 mg total) by mouth 3 (three) times daily as needed., Disp: 21 tablet, Rfl: 0;  esomeprazole (NEXIUM) 40 MG capsule, TAKE ONE CAPSULE BY MOUTH DAILY BEFORE BREAKFAST, Disp: 30 capsule, Rfl: 3;  HYDROcodone-acetaminophen (NORCO/VICODIN) 5-325 MG per tablet, Take one-two tabs po q 4-6 hrs prn pain, Disp: 20 tablet, Rfl: 0 oxybutynin (DITROPAN-XL) 5 MG 24 hr tablet, TAKE ONE (1) TABLET EACH DAY, Disp: 30 tablet, Rfl: 3;  venlafaxine XR (EFFEXOR-XR) 37.5 MG 24 hr capsule, Take 1 capsule (37.5 mg total) by mouth  daily with breakfast., Disp: 90 capsule, Rfl: 0  Review of Systems: Patient denies any headaches, blurred vision, shortness of breath, chest pain, abdominal pain, problems with bowel movements, urination, or intercourse. No joint pain or mood swings.But is having hot flashes and night sweats.    Physical Exam:BP 110/90  Pulse 76  Ht 5\' 4"  (1.626 m)  Wt 228 lb 8 oz (103.647 kg)  BMI 39.20 kg/m2 General:  Well developed, well nourished, no acute distress Skin:  Warm and dry Neck:  Midline trachea, normal thyroid Lungs; Clear to auscultation bilaterally Breast:  No dominant palpable mass, retraction, or nipple discharge Cardiovascular: Regular rate and rhythm Abdomen:  Soft, non tender, no hepatosplenomegaly Pelvic:  External genitalia is normal in appearance.  The vagina has loss of color, moisture and rugae.The cervix and uterus are absent.  No   adnexal masses or tenderness noted. Rectal: Good sphincter tone, no polyps, or hemorrhoids felt.  Hemoccult negative.+ rectocele. Extremities:  No swelling or varicosities noted Psych:  No mood changes, alert and cooperative, seems a little sad but OK. Discussed with her scleroderma, ET not good option, and I discussed with Dr Elonda Husky and her agrees, keep using Effexor, that should help.Discussed sleeping in cool dark room,dress light.  Impression: Yearly gyn exam no pap Hot flashes    Plan: Physical in 1  Year Mammogram yearly  Colonoscopy per GI Labs with Dr Moshe Cipro Review handout on menopause

## 2013-10-02 NOTE — Patient Instructions (Signed)
Menopause Menopause is the normal time of life when menstrual periods stop completely. Menopause is complete when you have missed 12 consecutive menstrual periods. It usually occurs between the ages of 48 years and 55 years. Very rarely does a woman develop menopause before the age of 40 years. At menopause, your ovaries stop producing the female hormones estrogen and progesterone. This can cause undesirable symptoms and also affect your health. Sometimes the symptoms may occur 4-5 years before the menopause begins. There is no relationship between menopause and:  Oral contraceptives.  Number of children you had.  Race.  The age your menstrual periods started (menarche). Heavy smokers and very thin women may develop menopause earlier in life. CAUSES  The ovaries stop producing the female hormones estrogen and progesterone.  Other causes include:  Surgery to remove both ovaries.  The ovaries stop functioning for no known reason.  Tumors of the pituitary gland in the brain.  Medical disease that affects the ovaries and hormone production.  Radiation treatment to the abdomen or pelvis.  Chemotherapy that affects the ovaries. SYMPTOMS   Hot flashes.  Night sweats.  Decrease in sex drive.  Vaginal dryness and thinning of the vagina causing painful intercourse.  Dryness of the skin and developing wrinkles.  Headaches.  Tiredness.  Irritability.  Memory problems.  Weight gain.  Bladder infections.  Hair growth of the face and chest.  Infertility. More serious symptoms include:  Loss of bone (osteoporosis) causing breaks (fractures).  Depression.  Hardening and narrowing of the arteries (atherosclerosis) causing heart attacks and strokes. DIAGNOSIS   When the menstrual periods have stopped for 12 straight months.  Physical exam.  Hormone studies of the blood. TREATMENT  There are many treatment choices and nearly as many questions about them. The  decisions to treat or not to treat menopausal changes is an individual choice made with your health care provider. Your health care provider can discuss the treatments with you. Together, you can decide which treatment will work best for you. Your treatment choices may include:   Hormone therapy (estrogen and progesterone).  Non-hormonal medicines.  Treating the individual symptoms with medicine (for example antidepressants for depression).  Herbal medicines that may help specific symptoms.  Counseling by a psychiatrist or psychologist.  Group therapy.  Lifestyle changes including:  Eating healthy.  Regular exercise.  Limiting caffeine and alcohol.  Stress management and meditation.  No treatment. HOME CARE INSTRUCTIONS   Take the medicine your health care provider gives you as directed.  Get plenty of sleep and rest.  Exercise regularly.  Eat a diet that contains calcium (good for the bones) and soy products (acts like estrogen hormone).  Avoid alcoholic beverages.  Do not smoke.  If you have hot flashes, dress in layers.  Take supplements, calcium, and vitamin D to strengthen bones.  You can use over-the-counter lubricants or moisturizers for vaginal dryness.  Group therapy is sometimes very helpful.  Acupuncture may be helpful in some cases. SEEK MEDICAL CARE IF:   You are not sure you are in menopause.  You are having menopausal symptoms and need advice and treatment.  You are still having menstrual periods after age 55 years.  You have pain with intercourse.  Menopause is complete (no menstrual period for 12 months) and you develop vaginal bleeding.  You need a referral to a specialist (gynecologist, psychiatrist, or psychologist) for treatment. SEEK IMMEDIATE MEDICAL CARE IF:   You have severe depression.  You have excessive vaginal bleeding.    You fell and think you have a broken bone.  You have pain when you urinate.  You develop leg or  chest pain.  You have a fast pounding heart beat (palpitations).  You have severe headaches.  You develop vision problems.  You feel a lump in your breast.  You have abdominal pain or severe indigestion. Document Released: 05/14/2003 Document Revised: 10/24/2012 Document Reviewed: 09/20/2012 [facility] Patient Information 2015 [place], [place]. This information is not intended to replace advice given to you by your health care provider. Make sure you discuss any questions you have with your health care provider. Physical in 1 year Mammogram yearly Colonoscopy per [facility] PCP

## 2013-10-09 ENCOUNTER — Other Ambulatory Visit: Payer: Self-pay | Admitting: Family Medicine

## 2013-10-28 ENCOUNTER — Encounter: Payer: Self-pay | Admitting: Family Medicine

## 2013-10-28 ENCOUNTER — Ambulatory Visit (INDEPENDENT_AMBULATORY_CARE_PROVIDER_SITE_OTHER): Payer: Medicare Other | Admitting: Family Medicine

## 2013-10-28 VITALS — BP 122/82 | HR 87 | Resp 16 | Ht 66.0 in | Wt 213.8 lb

## 2013-10-28 DIAGNOSIS — K219 Gastro-esophageal reflux disease without esophagitis: Secondary | ICD-10-CM

## 2013-10-28 DIAGNOSIS — I10 Essential (primary) hypertension: Secondary | ICD-10-CM | POA: Diagnosis not present

## 2013-10-28 DIAGNOSIS — F418 Other specified anxiety disorders: Secondary | ICD-10-CM

## 2013-10-28 DIAGNOSIS — F341 Dysthymic disorder: Secondary | ICD-10-CM | POA: Diagnosis not present

## 2013-10-28 DIAGNOSIS — S6990XD Unspecified injury of unspecified wrist, hand and finger(s), subsequent encounter: Secondary | ICD-10-CM

## 2013-10-28 DIAGNOSIS — E669 Obesity, unspecified: Secondary | ICD-10-CM

## 2013-10-28 DIAGNOSIS — S6980XA Other specified injuries of unspecified wrist, hand and finger(s), initial encounter: Secondary | ICD-10-CM

## 2013-10-28 DIAGNOSIS — N951 Menopausal and female climacteric states: Secondary | ICD-10-CM

## 2013-10-28 DIAGNOSIS — R7302 Impaired glucose tolerance (oral): Secondary | ICD-10-CM

## 2013-10-28 DIAGNOSIS — N39498 Other specified urinary incontinence: Secondary | ICD-10-CM

## 2013-10-28 DIAGNOSIS — R7309 Other abnormal glucose: Secondary | ICD-10-CM | POA: Diagnosis not present

## 2013-10-28 DIAGNOSIS — Z23 Encounter for immunization: Secondary | ICD-10-CM | POA: Diagnosis not present

## 2013-10-28 DIAGNOSIS — S6990XA Unspecified injury of unspecified wrist, hand and finger(s), initial encounter: Secondary | ICD-10-CM

## 2013-10-28 DIAGNOSIS — Z5189 Encounter for other specified aftercare: Secondary | ICD-10-CM

## 2013-10-28 MED ORDER — CYCLOBENZAPRINE HCL 10 MG PO TABS: 10.0000 mg | ORAL_TABLET | Freq: Three times a day (TID) | ORAL | Status: DC | PRN

## 2013-10-28 NOTE — Patient Instructions (Signed)
F/u in 4 month, call if you need me before    It is important that you exercise regularly at least 30 minutes 5 times a week. If you develop chest pain, have severe difficulty breathing, or feel very tired, stop exercising immediately and seek medical attention

## 2013-11-11 DIAGNOSIS — Z23 Encounter for immunization: Secondary | ICD-10-CM | POA: Insufficient documentation

## 2013-11-11 NOTE — Progress Notes (Signed)
Subjective:    Patient ID: Jenna Woods, female    DOB: September 06, 1960, 53 y.o.   MRN: [id number]  HPI  The PT is here for follow up and re-evaluation of chronic medical conditions, medication management and review of any available recent lab and radiology data.  Preventive health is updated, specifically  Cancer screening and Immunization.   Recently lost her chronically ill mother and is still dealing with the distress of this, as well as to be expected. Does not feel the need for therapy or change in medication. She is currently somewhat withdrawn and has poor appetite , has lost a significant amt of weight during this period of grief and re adjustment The PT denies any adverse reactions to current medications since the last visit.  There are no new concerns.  There are no specific complaints  Still being treated by ortho for partially amputated digit in the Fall of 2014 which occurred on the job     Review of Systems See HPI Denies recent fever or chills. Denies sinus pressure, nasal congestion, ear pain or sore throat. Denies chest congestion, productive cough or wheezing. Denies chest pains, palpitations and leg swelling Denies abdominal pain, nausea, vomiting,diarrhea or constipation.   Denies dysuria, frequency, hesitancy or incontinence. Denies uncontrolled  joint pain, swelling and limitation in mobility. Denies headaches, seizures, numbness, or tingling.  Denies skin break down or rash.        Objective:   Physical Exam  BP 122/82  Pulse 87  Resp 16  Ht 5\' 6"  (1.676 m)  Wt 213 lb 12.8 oz (96.979 kg)  BMI 34.52 kg/m2  SpO2 96%.  Patient alert and oriented and in no cardiopulmonary distress.  HEENT: No facial asymmetry, EOMI,   oropharynx pink and moist.  Neck supple no JVD, no mass.  Chest: Clear to auscultation bilaterally.  CVS: S1, S2 no murmurs, no S3.Regular rate.  ABD: Soft non tender.   Ext: No edema  MS: Adequate ROM spine, shoulders, hips  and knees.  Skin: Intact, no ulcerations or rash noted.  Psych: Good eye contact, normal affect. Memory intact not anxious mildly tearful at times and  depressed appearing.  CNS: CN 2-12 intact, power,  normal throughout.no focal deficits noted.        Assessment & Plan:  Hypertension Controlled, no change in medication DASH diet and commitment to daily physical activity for a minimum of 30 minutes discussed and encouraged, as a part of hypertension management. The importance of attaining a healthy weight is also discussed.   IGT (impaired glucose tolerance) Deteriorated Patient educated about the importance of limiting  Carbohydrate intake , the need to commit to daily physical activity for a minimum of 30 minutes , and to commit weight loss. The fact that changes in all these areas will reduce or eliminate all together the development of diabetes is stressed.     OBESITY Improved. Pt applauded on succesful weight loss through lifestyle change, and encouraged to continue same. Weight loss goal set for the next several months.   Depression with anxiety Controlled, no change in medication   Hot flashes, menopausal Improved with effexor  Urinary incontinence Controlled, no change in medication   Finger injury Improving, continues to be followed by ortho, still not back to full time work, but anticipoates starting in the Fall but with different resposibilities per ortho  GERD (gastroesophageal reflux disease) Controlled, no change in medication   Need for prophylactic vaccination and inoculation against influenza Vaccine  administered at visit.

## 2013-11-11 NOTE — Assessment & Plan Note (Signed)
Controlled, no change in medication  

## 2013-11-11 NOTE — Assessment & Plan Note (Signed)
Vaccine administered at visit.  

## 2013-11-11 NOTE — Assessment & Plan Note (Signed)
Improved with effexor

## 2013-11-11 NOTE — Assessment & Plan Note (Signed)
Improving, continues to be followed by ortho, still not back to full time work, but anticipoates starting in the Fall but with different resposibilities per ortho

## 2013-11-11 NOTE — Assessment & Plan Note (Signed)
Improved. Pt applauded on succesful weight loss through lifestyle change, and encouraged to continue same. Weight loss goal set for the next several months.  

## 2013-11-11 NOTE — Assessment & Plan Note (Signed)
Deteriorated Patient educated about the importance of limiting  Carbohydrate intake , the need to commit to daily physical activity for a minimum of 30 minutes , and to commit weight loss. The fact that changes in all these areas will reduce or eliminate all together the development of diabetes is stressed.    

## 2013-11-11 NOTE — Assessment & Plan Note (Signed)
Controlled, no change in medication DASH diet and commitment to daily physical activity for a minimum of 30 minutes discussed and encouraged, as a part of hypertension management. The importance of attaining a healthy weight is also discussed.  

## 2013-11-14 ENCOUNTER — Other Ambulatory Visit: Payer: Self-pay

## 2013-11-14 MED ORDER — AMLODIPINE BESYLATE 10 MG PO TABS: ORAL_TABLET | ORAL | Status: DC

## 2013-11-15 DIAGNOSIS — Z5189 Encounter for other specified aftercare: Secondary | ICD-10-CM | POA: Diagnosis not present

## 2013-11-25 ENCOUNTER — Other Ambulatory Visit: Payer: Self-pay | Admitting: Family Medicine

## 2013-12-03 ENCOUNTER — Other Ambulatory Visit: Payer: Self-pay | Admitting: Family Medicine

## 2013-12-03 DIAGNOSIS — Z1231 Encounter for screening mammogram for malignant neoplasm of breast: Secondary | ICD-10-CM

## 2013-12-06 ENCOUNTER — Other Ambulatory Visit: Payer: Self-pay | Admitting: Family Medicine

## 2013-12-11 ENCOUNTER — Other Ambulatory Visit: Payer: Self-pay | Admitting: Family Medicine

## 2014-01-01 ENCOUNTER — Inpatient Hospital Stay (HOSPITAL_COMMUNITY): Admission: RE | Admit: 2014-01-01 | Payer: PRIVATE HEALTH INSURANCE | Source: Ambulatory Visit

## 2014-01-06 ENCOUNTER — Encounter: Payer: Self-pay | Admitting: Family Medicine

## 2014-01-27 ENCOUNTER — Ambulatory Visit (HOSPITAL_COMMUNITY)
Admission: RE | Admit: 2014-01-27 | Discharge: 2014-01-27 | Disposition: A | Discharge status: Home / Self Care | Payer: Medicare Other | Source: Ambulatory Visit | Attending: Family Medicine | Admitting: Family Medicine

## 2014-01-27 DIAGNOSIS — Z1231 Encounter for screening mammogram for malignant neoplasm of breast: Secondary | ICD-10-CM | POA: Diagnosis not present

## 2014-03-03 DIAGNOSIS — I10 Essential (primary) hypertension: Secondary | ICD-10-CM | POA: Diagnosis not present

## 2014-03-03 DIAGNOSIS — R7302 Impaired glucose tolerance (oral): Secondary | ICD-10-CM | POA: Diagnosis not present

## 2014-03-03 LAB — CBC
HCT: 40.3 % (ref 36.0–46.0)
HEMOGLOBIN: 13.2 g/dL (ref 12.0–15.0)
MCH: 29.3 pg (ref 26.0–34.0)
MCHC: 32.8 g/dL (ref 30.0–36.0)
MCV: 89.6 fL (ref 78.0–100.0)
MPV: 9.2 fL — AB (ref 9.4–12.4)
Platelets: 371 10*3/uL (ref 150–400)
RBC: 4.5 MIL/uL (ref 3.87–5.11)
RDW: 13.1 % (ref 11.5–15.5)
WBC: 8.1 10*3/uL (ref 4.0–10.5)

## 2014-03-03 LAB — BASIC METABOLIC PANEL
BUN: 11 mg/dL (ref 6–23)
CALCIUM: 9.4 mg/dL (ref 8.4–10.5)
CHLORIDE: 107 meq/L (ref 96–112)
CO2: 27 mEq/L (ref 19–32)
CREATININE: 0.71 mg/dL (ref 0.50–1.10)
Glucose, Bld: 99 mg/dL (ref 70–99)
Potassium: 4.2 mEq/L (ref 3.5–5.3)
SODIUM: 139 meq/L (ref 135–145)

## 2014-03-03 LAB — LIPID PANEL
CHOL/HDL RATIO: 2.6 ratio
Cholesterol: 116 mg/dL (ref 0–200)
HDL: 45 mg/dL (ref >39)
LDL Cholesterol: 56 mg/dL (ref 0–99)
Triglycerides: 75 mg/dL (ref <150)
VLDL: 15 mg/dL (ref 0–40)

## 2014-03-04 LAB — HEMOGLOBIN A1C
Hgb A1c MFr Bld: 6.2 % — ABNORMAL HIGH (ref <5.7)
Mean Plasma Glucose: 131 mg/dL — ABNORMAL HIGH (ref <117)

## 2014-03-05 ENCOUNTER — Encounter: Payer: Self-pay | Admitting: Family Medicine

## 2014-03-05 ENCOUNTER — Ambulatory Visit (INDEPENDENT_AMBULATORY_CARE_PROVIDER_SITE_OTHER): Payer: Medicare Other | Admitting: Family Medicine

## 2014-03-05 VITALS — BP 128/74 | HR 84 | Resp 18 | Ht 66.0 in | Wt 240.0 lb

## 2014-03-05 DIAGNOSIS — K219 Gastro-esophageal reflux disease without esophagitis: Secondary | ICD-10-CM

## 2014-03-05 DIAGNOSIS — Z1211 Encounter for screening for malignant neoplasm of colon: Secondary | ICD-10-CM | POA: Diagnosis not present

## 2014-03-05 DIAGNOSIS — F418 Other specified anxiety disorders: Secondary | ICD-10-CM

## 2014-03-05 DIAGNOSIS — I10 Essential (primary) hypertension: Secondary | ICD-10-CM | POA: Diagnosis not present

## 2014-03-05 DIAGNOSIS — R7302 Impaired glucose tolerance (oral): Secondary | ICD-10-CM | POA: Diagnosis not present

## 2014-03-05 DIAGNOSIS — N3946 Mixed incontinence: Secondary | ICD-10-CM

## 2014-03-05 DIAGNOSIS — IMO0001 Reserved for inherently not codable concepts without codable children: Secondary | ICD-10-CM

## 2014-03-05 MED ORDER — PHENTERMINE HCL 37.5 MG PO TABS: 37.5000 mg | ORAL_TABLET | Freq: Every day | ORAL | Status: DC

## 2014-03-05 MED ORDER — SCOPOLAMINE 1 MG/3DAYS TD PT72: 1.0000 | MEDICATED_PATCH | TRANSDERMAL | Status: DC

## 2014-03-05 MED ORDER — ALPRAZOLAM 1 MG PO TABS: ORAL_TABLET | ORAL | Status: DC

## 2014-03-05 MED ORDER — VENLAFAXINE HCL ER 75 MG PO CP24: 75.0000 mg | ORAL_CAPSULE | Freq: Every day | ORAL | Status: DC

## 2014-03-05 NOTE — Patient Instructions (Addendum)
F/u in 3 month, call if you need me before  Start phentermine one daily, change eating habits and commit to exercise for 30 minutes 5 days per week  Weight loss goal of 4 to 5 pounds per m,onth  Please call [facility] and start going back to therapist, also effexor for hot flashes is also for depression and the dose is increased to 75 mg daily, OK to take  TWOmg 37.5 mg capsules daily till done  Blood sugar avg has increased, you are nearer to becnming diabetic , so you need to work on changing this , and I know that you will  All the best for 2016, things WILL improve

## 2014-03-05 NOTE — Progress Notes (Signed)
Subjective:    Patient ID: Jenna Woods, female    DOB: 11/02/1960, 53 y.o.   MRN: [id number]  HPI The PT is here for follow up and re-evaluation of chronic medical conditions, medication management and review of any available recent lab and radiology data.  Preventive health is updated, specifically  Cancer screening and Immunization.  Needs colonoscopy . The PT denies any adverse reactions to current medications since the last visit.  Increased and uncontrolled stress, anxiety and depression , since her Mom's death, also one son had her first grandchild in November , and the situation is unsettled as far as parenting is concerned but she is working on this, also has lost interest in  A relationship that she described as being "the one"      Review of Systems See HPI Denies recent fever or chills. Denies sinus pressure, nasal congestion, ear pain or sore throat. Denies chest congestion, productive cough or wheezing. Denies chest pains, palpitations and leg swelling Denies abdominal pain, nausea, vomiting,diarrhea or constipation.   Denies dysuria, frequency, hesitancy or uncontrolled  incontinence. Denies joint pain, swelling and limitation in mobility. Denies headaches, seizures, numbness, or tingling. Denies skin break down or rash.        Objective:   Physical Exam BP 128/74 mmHg  Pulse 84  Resp 18  Ht 5\' 6"  (1.676 m)  Wt 240 lb (108.863 kg)  BMI 38.76 kg/m2  SpO2 96% Patient alert and oriented and in no cardiopulmonary distress.Tearful, depressed and anxious  HEENT: No facial asymmetry, EOMI,   oropharynx pink and moist.  Neck supple no JVD, no mass.  Chest: Clear to auscultation bilaterally.  CVS: S1, S2 no murmurs, no S3.Regular rate.  ABD: Soft non tender.   Ext: No edema  MS: Adequate ROM spine, shoulders, hips and knees.  Skin: Intact, no ulcerations or rash noted.  Psych: Good eye contact, . Memory intact , not suicidal or homicidal, no  hallucinations  CNS: CN 2-12 intact, power,  normal throughout.no focal deficits noted.        Assessment & Plan:  Hypertension Controlled, no change in medication DASH diet and commitment to daily physical activity for a minimum of 30 minutes discussed and encouraged, as a part of hypertension management. The importance of attaining a healthy weight is also discussed.   Depression with anxiety Deteriorated, since losing her Mom approx 6 months ago, increased personal stress with family and relationships. Has become withdrawn, not suicidal or homicidal, states she will cal her therapist for help Effexor dose increased Remains anchored in her faith and does have family support  Urinary incontinence Controlled, no change in medication   Obesity, Class II, BMI 35-39.9, with comorbidity Deteriorated. Patient re-educated about  the importance of commitment to a  minimum of 150 minutes of exercise per week. The importance of healthy food choices with portion control discussed. Encouraged to start a food diary, count calories and to consider  joining a support group. Sample diet sheets offered. Goals set by the patient for the next several months.     IGT (impaired glucose tolerance) Deteriorated Patient educated about the importance of limiting  Carbohydrate intake , the need to commit to daily physical activity for a minimum of 30 minutes , and to commit weight loss. The fact that changes in all these areas will reduce or eliminate all together the development of diabetes is stressed.     GERD (gastroesophageal reflux disease) Controlled, no change in medication

## 2014-03-08 NOTE — Assessment & Plan Note (Signed)
Controlled, no change in medication DASH diet and commitment to daily physical activity for a minimum of 30 minutes discussed and encouraged, as a part of hypertension management. The importance of attaining a healthy weight is also discussed.  

## 2014-03-08 NOTE — Assessment & Plan Note (Signed)
Deteriorated. Patient re-educated about  the importance of commitment to a  minimum of 150 minutes of exercise per week. The importance of healthy food choices with portion control discussed. Encouraged to start a food diary, count calories and to consider  joining a support group. Sample diet sheets offered. Goals set by the patient for the next several months.    

## 2014-03-08 NOTE — Assessment & Plan Note (Signed)
Controlled, no change in medication  

## 2014-03-08 NOTE — Assessment & Plan Note (Signed)
Deteriorated Patient educated about the importance of limiting  Carbohydrate intake , the need to commit to daily physical activity for a minimum of 30 minutes , and to commit weight loss. The fact that changes in all these areas will reduce or eliminate all together the development of diabetes is stressed.    

## 2014-03-08 NOTE — Assessment & Plan Note (Signed)
Deteriorated, since losing her Mom approx 6 months ago, increased personal stress with family and relationships. Has become withdrawn, not suicidal or homicidal, states she will cal her therapist for help Effexor dose increased Remains anchored in her faith and does have family support

## 2014-04-10 ENCOUNTER — Other Ambulatory Visit: Payer: Self-pay | Admitting: Family Medicine

## 2014-05-13 ENCOUNTER — Other Ambulatory Visit: Payer: Self-pay | Admitting: Family Medicine

## 2014-05-31 ENCOUNTER — Other Ambulatory Visit: Payer: Self-pay | Admitting: Family Medicine

## 2014-06-04 ENCOUNTER — Encounter: Payer: Self-pay | Admitting: Family Medicine

## 2014-06-04 ENCOUNTER — Ambulatory Visit (INDEPENDENT_AMBULATORY_CARE_PROVIDER_SITE_OTHER): Payer: Medicare Other | Admitting: Family Medicine

## 2014-06-04 VITALS — BP 130/72 | HR 110 | Resp 18 | Ht 66.0 in | Wt 242.0 lb

## 2014-06-04 DIAGNOSIS — R7302 Impaired glucose tolerance (oral): Secondary | ICD-10-CM

## 2014-06-04 DIAGNOSIS — F418 Other specified anxiety disorders: Secondary | ICD-10-CM

## 2014-06-04 DIAGNOSIS — I10 Essential (primary) hypertension: Secondary | ICD-10-CM

## 2014-06-04 DIAGNOSIS — K219 Gastro-esophageal reflux disease without esophagitis: Secondary | ICD-10-CM

## 2014-06-04 DIAGNOSIS — N39498 Other specified urinary incontinence: Secondary | ICD-10-CM

## 2014-06-04 DIAGNOSIS — N951 Menopausal and female climacteric states: Secondary | ICD-10-CM

## 2014-06-04 DIAGNOSIS — IMO0001 Reserved for inherently not codable concepts without codable children: Secondary | ICD-10-CM

## 2014-06-04 NOTE — Patient Instructions (Signed)
Annual wellness in 3 month, call if you need me before  Jenna Woods are being referred to therapist and psychiatrist, things will get better

## 2014-06-22 NOTE — Assessment & Plan Note (Signed)
Deteriorated. Patient re-educated about  the importance of commitment to a  minimum of 150 minutes of exercise per week.  The importance of healthy food choices with portion control discussed. Encouraged to start a food diary, count calories and to consider  joining a support group. Sample diet sheets offered. Goals set by the patient for the next several months.   Weight /BMI 06/04/2014 03/05/2014 10/28/2013  WEIGHT 242 lb 240 lb 213 lb 12.8 oz  HEIGHT 5\' 6"  5\' 6"  5\' 6"   BMI 39.08 kg/m2 38.76 kg/m2 34.52 kg/m2    Current exercise per week 60 minutes.

## 2014-06-22 NOTE — Progress Notes (Signed)
Subjective:    Patient ID: Jenna Woods, female    DOB: 16-Jul-1960, 54 y.o.   MRN: [id number]  HPI The PT is here for follow up and re-evaluation of chronic medical conditions, medication management and review of any available recent lab and radiology data.  Preventive health is updated, specifically  Cancer screening and Immunization.    The PT denies any adverse reactions to current medications since the last visit.  C/o increased and uncontrolled depression and anxiety, not exercising regularly and over eating as a compensation strategy       Review of Systems See HPI Denies recent fever or chills. Denies sinus pressure, nasal congestion, ear pain or sore throat. Denies chest congestion, productive cough or wheezing. Denies chest pains, palpitations and leg swelling Denies abdominal pain, nausea, vomiting,diarrhea or constipation.   Denies dysuria, frequency, hesitancy or incontinence. Denies joint pain, swelling and limitation in mobility. Denies headaches, seizures, numbness, or tingling. C/o increased and uncontrolled  depression, anxiety and  insomnia. Denies skin break down or rash.        Objective:   Physical Exam BP 130/72 mmHg  Pulse 110  Resp 18  Ht 5\' 6"  (1.676 m)  Wt 242 lb (109.77 kg)  BMI 39.08 kg/m2  SpO2 95% Patient alert and oriented and in no cardiopulmonary distress.  HEENT: No facial asymmetry, EOMI,   oropharynx pink and moist.  Neck supple no JVD, no mass.  Chest: Clear to auscultation bilaterally.  CVS: S1, S2 no murmurs, no S3.Regular rate.  ABD: Soft non tender.   Ext: No edema  MS: Adequate ROM spine, shoulders, hips and knees.  Skin: Intact, no ulcerations or rash noted.  Psych: Good eye contact, tearful affect. Memory intact  anxious and  depressed appearing.  CNS: CN 2-12 intact, power,  normal throughout.no focal deficits noted.        Assessment & Plan:  Depression with anxiety Chronic depression ,  inadequately treated, past h/o suicidal ideation, has been followed iun the past by mental health, wants to establish with new Provider, Not currently suicidal or homicidal, a lot of family issues which contribute her feeling like an "outcast"all the time, will refer both to therapy and psychiatry   Hypertension Controlled, no change in medication DASH diet and commitment to daily physical activity for a minimum of 30 minutes discussed and encouraged, as a part of hypertension management. The importance of attaining a healthy weight is also discussed.  BP/Weight 06/04/2014 03/05/2014 10/28/2013 10/02/2013 09/21/2013 08/05/2013 8/85/0277  Systolic BP 412 878 676 720 947 096 283  Diastolic BP 72 74 82 90 85 84 76  Wt. (Lbs) 242 240 213.8 228.5 198 230.12 229.08  BMI 39.08 38.76 34.52 39.2 30.11 37.16 36.99         Obesity, Class II, BMI 35-39.9, with comorbidity Deteriorated. Patient re-educated about  the importance of commitment to a  minimum of 150 minutes of exercise per week.  The importance of healthy food choices with portion control discussed. Encouraged to start a food diary, count calories and to consider  joining a support group. Sample diet sheets offered. Goals set by the patient for the next several months.   Weight /BMI 06/04/2014 03/05/2014 10/28/2013  WEIGHT 242 lb 240 lb 213 lb 12.8 oz  HEIGHT 5\' 6"  5\' 6"  5\' 6"   BMI 39.08 kg/m2 38.76 kg/m2 34.52 kg/m2    Current exercise per week 60 minutes.    Urinary incontinence Controlled, no change in medication  Hot flashes, menopausal Improving with effexor , continue same   IGT (impaired glucose tolerance) Deteriorated.Updated lab needed at/ before next visit.  Patient educated about the importance of limiting  Carbohydrate intake , the need to commit to daily physical activity for a minimum of 30 minutes , and to commit weight loss. The fact that changes in all these areas will reduce or eliminate all together  the development of diabetes is stressed.   Diabetic Labs Latest Ref Rng 03/03/2014 08/05/2013 04/02/2013 10/09/2012 06/05/2012  HbA1c <5.7 % 6.2(H) 6.0(H) 5.8(H) 5.9(H) 6.3(H)  Chol 0 - 200 mg/dL 116 - - 128 -  HDL >39 mg/dL 45 - - 53 -  Calc LDL 0 - 99 mg/dL 56 - - 58 -  Triglycerides <150 mg/dL 75 - - 85 -  Creatinine 0.50 - 1.10 mg/dL 0.71 0.68 - 0.79 0.80   BP/Weight 06/04/2014 03/05/2014 10/28/2013 10/02/2013 09/21/2013 08/05/2013 09/03/5282  Systolic BP 132 440 102 725 366 440 347  Diastolic BP 72 74 82 90 85 84 76  Wt. (Lbs) 242 240 213.8 228.5 198 230.12 229.08  BMI 39.08 38.76 34.52 39.2 30.11 37.16 36.99   No flowsheet data found.      GERD (gastroesophageal reflux disease) Controlled, no change in medication

## 2014-06-22 NOTE — Assessment & Plan Note (Addendum)
Deteriorated.Updated lab needed at/ before next visit.  Patient educated about the importance of limiting  Carbohydrate intake , the need to commit to daily physical activity for a minimum of 30 minutes , and to commit weight loss. The fact that changes in all these areas will reduce or eliminate all together the development of diabetes is stressed.   Diabetic Labs Latest Ref Rng 03/03/2014 08/05/2013 04/02/2013 10/09/2012 06/05/2012  HbA1c <5.7 % 6.2(H) 6.0(H) 5.8(H) 5.9(H) 6.3(H)  Chol 0 - 200 mg/dL 116 - - 128 -  HDL >39 mg/dL 45 - - 53 -  Calc LDL 0 - 99 mg/dL 56 - - 58 -  Triglycerides <150 mg/dL 75 - - 85 -  Creatinine 0.50 - 1.10 mg/dL 0.71 0.68 - 0.79 0.80   BP/Weight 06/04/2014 03/05/2014 10/28/2013 10/02/2013 09/21/2013 08/05/2013 7/78/2423  Systolic BP 536 144 315 400 867 619 509  Diastolic BP 72 74 82 90 85 84 76  Wt. (Lbs) 242 240 213.8 228.5 198 230.12 229.08  BMI 39.08 38.76 34.52 39.2 30.11 37.16 36.99   No flowsheet data found.

## 2014-06-22 NOTE — Assessment & Plan Note (Signed)
Chronic depression , inadequately treated, past h/o suicidal ideation, has been followed iun the past by mental health, wants to establish with new Provider, Not currently suicidal or homicidal, a lot of family issues which contribute her feeling like an "outcast"all the time, will refer both to therapy and psychiatry

## 2014-06-22 NOTE — Assessment & Plan Note (Signed)
Improving with effexor , continue same

## 2014-06-22 NOTE — Assessment & Plan Note (Signed)
Controlled, no change in medication  

## 2014-06-22 NOTE — Assessment & Plan Note (Signed)
Controlled, no change in medication DASH diet and commitment to daily physical activity for a minimum of 30 minutes discussed and encouraged, as a part of hypertension management. The importance of attaining a healthy weight is also discussed.  BP/Weight 06/04/2014 03/05/2014 10/28/2013 10/02/2013 09/21/2013 08/05/2013 07/01/621  Systolic BP 762 831 517 616 073 710 626  Diastolic BP 72 74 82 90 85 84 76  Wt. (Lbs) 242 240 213.8 228.5 198 230.12 229.08  BMI 39.08 38.76 34.52 39.2 30.11 37.16 36.99

## 2014-06-26 ENCOUNTER — Telehealth (HOSPITAL_COMMUNITY): Payer: Self-pay | Admitting: *Deleted

## 2014-06-27 ENCOUNTER — Telehealth (HOSPITAL_COMMUNITY): Payer: Self-pay | Admitting: *Deleted

## 2014-07-02 ENCOUNTER — Other Ambulatory Visit: Payer: Self-pay | Admitting: Family Medicine

## 2014-07-23 ENCOUNTER — Telehealth (HOSPITAL_COMMUNITY): Payer: Self-pay | Admitting: *Deleted

## 2014-08-16 ENCOUNTER — Other Ambulatory Visit: Payer: Self-pay | Admitting: Family Medicine

## 2014-08-18 ENCOUNTER — Ambulatory Visit (HOSPITAL_COMMUNITY): Payer: Self-pay | Admitting: Psychiatry

## 2014-08-29 ENCOUNTER — Ambulatory Visit (INDEPENDENT_AMBULATORY_CARE_PROVIDER_SITE_OTHER): Payer: Medicare Other | Admitting: Psychiatry

## 2014-08-29 ENCOUNTER — Encounter (HOSPITAL_COMMUNITY): Payer: Self-pay | Admitting: Psychiatry

## 2014-08-29 VITALS — BP 100/82 | Ht 66.0 in | Wt 234.0 lb

## 2014-08-29 DIAGNOSIS — F329 Major depressive disorder, single episode, unspecified: Secondary | ICD-10-CM | POA: Insufficient documentation

## 2014-08-29 DIAGNOSIS — F331 Major depressive disorder, recurrent, moderate: Secondary | ICD-10-CM

## 2014-08-29 MED ORDER — TRAZODONE HCL 50 MG PO TABS: 50.0000 mg | ORAL_TABLET | Freq: Every day | ORAL | Status: DC

## 2014-08-29 MED ORDER — VENLAFAXINE HCL ER 150 MG PO CP24: 150.0000 mg | ORAL_CAPSULE | Freq: Every day | ORAL | Status: DC
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
## 2014-09-04 ENCOUNTER — Ambulatory Visit: Payer: Medicare Other | Admitting: Family Medicine

## 2014-09-06 ENCOUNTER — Other Ambulatory Visit: Payer: Self-pay | Admitting: Family Medicine

## 2014-09-15 ENCOUNTER — Telehealth (HOSPITAL_COMMUNITY): Payer: Self-pay | Admitting: *Deleted

## 2014-09-15 ENCOUNTER — Encounter: Payer: Self-pay | Admitting: Family Medicine

## 2014-09-15 ENCOUNTER — Ambulatory Visit (INDEPENDENT_AMBULATORY_CARE_PROVIDER_SITE_OTHER): Payer: Medicare Other | Admitting: Family Medicine

## 2014-09-15 VITALS — BP 108/76 | HR 92 | Resp 18 | Ht 66.0 in | Wt 239.0 lb

## 2014-09-15 DIAGNOSIS — Z114 Encounter for screening for human immunodeficiency virus [HIV]: Secondary | ICD-10-CM

## 2014-09-15 DIAGNOSIS — F418 Other specified anxiety disorders: Secondary | ICD-10-CM

## 2014-09-15 DIAGNOSIS — N39498 Other specified urinary incontinence: Secondary | ICD-10-CM

## 2014-09-15 DIAGNOSIS — F331 Major depressive disorder, recurrent, moderate: Secondary | ICD-10-CM

## 2014-09-15 DIAGNOSIS — K219 Gastro-esophageal reflux disease without esophagitis: Secondary | ICD-10-CM

## 2014-09-15 DIAGNOSIS — F17218 Nicotine dependence, cigarettes, with other nicotine-induced disorders: Secondary | ICD-10-CM | POA: Diagnosis not present

## 2014-09-15 DIAGNOSIS — N951 Menopausal and female climacteric states: Secondary | ICD-10-CM

## 2014-09-15 DIAGNOSIS — I10 Essential (primary) hypertension: Secondary | ICD-10-CM | POA: Diagnosis not present

## 2014-09-15 DIAGNOSIS — Z113 Encounter for screening for infections with a predominantly sexual mode of transmission: Secondary | ICD-10-CM | POA: Diagnosis not present

## 2014-09-15 DIAGNOSIS — R7302 Impaired glucose tolerance (oral): Secondary | ICD-10-CM

## 2014-09-15 DIAGNOSIS — F489 Nonpsychotic mental disorder, unspecified: Secondary | ICD-10-CM

## 2014-09-15 DIAGNOSIS — Z1159 Encounter for screening for other viral diseases: Secondary | ICD-10-CM | POA: Diagnosis not present

## 2014-09-15 DIAGNOSIS — M349 Systemic sclerosis, unspecified: Secondary | ICD-10-CM

## 2014-09-15 DIAGNOSIS — M329 Systemic lupus erythematosus, unspecified: Secondary | ICD-10-CM

## 2014-09-15 DIAGNOSIS — R5382 Chronic fatigue, unspecified: Secondary | ICD-10-CM

## 2014-09-15 DIAGNOSIS — F5105 Insomnia due to other mental disorder: Secondary | ICD-10-CM | POA: Insufficient documentation

## 2014-09-15 NOTE — Patient Instructions (Signed)
Annual wellness in 4 month, call if you need me before  You are referred to urologist re incontinence  Thankful you are doing better since starting psychiatry  Quit date for cigarette is 09/19/2014, continue to use alternative methods for coping with stress  It is important that you exercise regularly at least 30 minutes 5 times a week. If you develop chest pain, have severe difficulty breathing, or feel very tired, stop exercising immediately and seek medical attention   HIV, HBA1C , chem 7 and EGFR, TSH today  I am asking Dr Harrington Challenger to prescribe your xanax, also stop by and discuss the trazodone dose with her as able

## 2014-09-15 NOTE — Assessment & Plan Note (Addendum)
Improved. Patient re-educated about  the importance of commitment to a  minimum of 150 minutes of exercise per week.  The importance of healthy food choices with portion control discussed. Encouraged to start a food diary, count calories and to consider  joining a support group. Sample diet sheets offered. Goals set by the patient for the next several months.   Weight /BMI 09/15/2014 06/04/2014 03/05/2014  WEIGHT 239 lb 0.6 oz 242 lb 240 lb  HEIGHT 5\' 6"  5\' 6"  5\' 6"   BMI 38.6 kg/m2 39.08 kg/m2 38.76 kg/m2  Some encounter information is confidential and restricted. Go to Review Flowsheets activity to see all data.    Current exercise per week 90 minutes.

## 2014-09-15 NOTE — Assessment & Plan Note (Signed)
Controlled, no change in medication DASH diet and commitment to daily physical activity for a minimum of 30 minutes discussed and encouraged, as a part of hypertension management. The importance of attaining a healthy weight is also discussed.  BP/Weight 09/15/2014 06/04/2014 03/05/2014 10/28/2013 10/02/2013 0/32/1224 10/06/5001  Systolic BP 704 888 916 945 038 882 800  Diastolic BP 76 72 74 82 90 85 84  Wt. (Lbs) 239.04 242 240 213.8 228.5 198 230.12  BMI 38.6 39.08 38.76 34.52 39.2 30.11 37.16  Some encounter information is confidential and restricted. Go to Review Flowsheets activity to see all data.

## 2014-09-15 NOTE — Telephone Encounter (Signed)
Patient came in, said she been taking 2 of the Trazodone 50 mg. pills at night and she will run out of pills.

## 2014-09-15 NOTE — Assessment & Plan Note (Signed)
Patient educated about the importance of limiting  Carbohydrate intake , the need to commit to daily physical activity for a minimum of 30 minutes , and to commit weight loss. The fact that changes in all these areas will reduce or eliminate all together the development of diabetes is stressed.   Diabetic Labs Latest Ref Rng 03/03/2014 08/05/2013 04/02/2013 10/09/2012 06/05/2012  HbA1c <5.7 % 6.2(H) 6.0(H) 5.8(H) 5.9(H) 6.3(H)  Chol 0 - 200 mg/dL 116 - - 128 -  HDL >39 mg/dL 45 - - 53 -  Calc LDL 0 - 99 mg/dL 56 - - 58 -  Triglycerides <150 mg/dL 75 - - 85 -  Creatinine 0.50 - 1.10 mg/dL 0.71 0.68 - 0.79 0.80   BP/Weight 09/15/2014 06/04/2014 03/05/2014 10/28/2013 10/02/2013 8/93/7342 10/11/6809  Systolic BP 572 620 355 974 163 845 364  Diastolic BP 76 72 74 82 90 85 84  Wt. (Lbs) 239.04 242 240 213.8 228.5 198 230.12  BMI 38.6 39.08 38.76 34.52 39.2 30.11 37.16  Some encounter information is confidential and restricted. Go to Review Flowsheets activity to see all data.   No flowsheet data found.   Updated lab needed.

## 2014-09-15 NOTE — Assessment & Plan Note (Signed)
Improved since seeing psych and I am sure that she now has the support to continue to do so

## 2014-09-15 NOTE — Assessment & Plan Note (Signed)
Current is 3 per day, ready to quit Patient counseled for approximately 5 minutes regarding the health risks of ongoing nicotine use, specifically all types of cancer, heart disease, stroke and respiratory failure. The options available for help with cessation ,the behavioral changes to assist the process, and the option to either gradully reduce usage  Or abruptly stop.is also discussed. Pt is also encouraged to set specific goals in number of cigarettes used daily, as well as to set a quit date.  Number of cigarettes/cigars currently smoking daily:3

## 2014-09-15 NOTE — Assessment & Plan Note (Addendum)
No response to medication , wants surguical intervention, referral made to Doc she has seen in the past

## 2014-09-15 NOTE — Assessment & Plan Note (Signed)
Inadequate management with,  trazodone 50 mg dose, sleep hygiene reviewed, med management by psych, will send a message

## 2014-09-15 NOTE — Assessment & Plan Note (Signed)
Controlled, no change in medication  

## 2014-09-15 NOTE — Progress Notes (Signed)
Jenna Woods     MRN: [id number]      DOB: 1960-06-20   HPI Jenna Woods is here for follow up and re-evaluation of chronic medical conditions, medication management and review of any available recent lab and radiology data.  Preventive health is updated, specifically  Cancer screening and Immunization.   Is currently seeing psychiatrist and reports  Good benefit, wiill also start seeing therapist.No longer suicidal, Has ended relationship with an alcoholic. Still struggling with Mom's death but improving. staes she needs to double sleeping pils for sleep she is to address with psych The PT denies any adverse reactions to current medications since the last visit. States her incontinence is disabling and she wants surgical intervention, does however report poor experience with anaesthesia attempt in the past    Wi ll refer to urologistROS Denies recent fever or chills. Denies sinus pressure, nasal congestion, ear pain or sore throat. Denies chest congestion, productive cough or wheezing. Denies chest pains, palpitations and leg swelling Denies abdominal pain, nausea, vomiting,diarrhea or constipation.    Denies joint pain, swelling and limitation in mobility. Denies headaches, seizures, numbness, or tingling. Denies skin break down or rash.   PE  BP 108/76 mmHg  Pulse 92  Resp 18  Ht 5\' 6"  (1.676 m)  Wt 239 lb 0.6 oz (108.428 kg)  BMI 38.60 kg/m2  SpO2 93%  Patient alert and oriented and in no cardiopulmonary distress.  HEENT: No facial asymmetry, EOMI,   oropharynx pink and moist.  Neck supple no JVD, no mass.  Chest: Clear to auscultation bilaterally.  CVS: S1, S2 no murmurs, no S3.Regular rate.  ABD: Soft non tender.   Ext: No edema  MS: Adequate ROM spine, shoulders, hips and knees.  Skin: Intact, no ulcerations or rash noted.  Psych: Good eye contact, normal affect. Memory intact not anxious or depressed appearing.  CNS: CN 2-12 intact, power,  normal  throughout.no focal deficits noted.   Assessment & Plan   Nicotine dependence, cigarettes, with other nicotine-induced disorders Current is 3 per day, ready to quit Patient counseled for approximately 5 minutes regarding the health risks of ongoing nicotine use, specifically all types of cancer, heart disease, stroke and respiratory failure. The options available for help with cessation ,the behavioral changes to assist the process, and the option to either gradully reduce usage  Or abruptly stop.is also discussed. Pt is also encouraged to set specific goals in number of cigarettes used daily, as well as to set a quit date.  Number of cigarettes/cigars currently smoking daily:3   Urinary incontinence No response to medication , wants surguical intervention, referral made to Doc she has seen in the past  Hypertension Controlled, no change in medication DASH diet and commitment to daily physical activity for a minimum of 30 minutes discussed and encouraged, as a part of hypertension management. The importance of attaining a healthy weight is also discussed.  BP/Weight 09/15/2014 06/04/2014 03/05/2014 10/28/2013 10/02/2013 04/18/9415 4/0/8144  Systolic BP 818 563 149 702 637 858 850  Diastolic BP 76 72 74 82 90 85 84  Wt. (Lbs) 239.04 242 240 213.8 228.5 198 230.12  BMI 38.6 39.08 38.76 34.52 39.2 30.11 37.16  Some encounter information is confidential and restricted. Go to Review Flowsheets activity to see all data.        GERD (gastroesophageal reflux disease) Controlled, no change in medication   IGT (impaired glucose tolerance) Patient educated about the importance of limiting  Carbohydrate intake , the  need to commit to daily physical activity for a minimum of 30 minutes , and to commit weight loss. The fact that changes in all these areas will reduce or eliminate all together the development of diabetes is stressed.   Diabetic Labs Latest Ref Rng 03/03/2014 08/05/2013 04/02/2013  10/09/2012 06/05/2012  HbA1c <5.7 % 6.2(H) 6.0(H) 5.8(H) 5.9(H) 6.3(H)  Chol 0 - 200 mg/dL 116 - - 128 -  HDL >39 mg/dL 45 - - 53 -  Calc LDL 0 - 99 mg/dL 56 - - 58 -  Triglycerides <150 mg/dL 75 - - 85 -  Creatinine 0.50 - 1.10 mg/dL 0.71 0.68 - 0.79 0.80   BP/Weight 09/15/2014 06/04/2014 03/05/2014 10/28/2013 10/02/2013 6/60/6004 07/13/9772  Systolic BP 142 395 320 233 435 686 168  Diastolic BP 76 72 74 82 90 85 84  Wt. (Lbs) 239.04 242 240 213.8 228.5 198 230.12  BMI 38.6 39.08 38.76 34.52 39.2 30.11 37.16  Some encounter information is confidential and restricted. Go to Review Flowsheets activity to see all data.   No flowsheet data found.   Updated lab needed.   Morbid obesity Improved. Patient re-educated about  the importance of commitment to a  minimum of 150 minutes of exercise per week.  The importance of healthy food choices with portion control discussed. Encouraged to start a food diary, count calories and to consider  joining a support group. Sample diet sheets offered. Goals set by the patient for the next several months.   Weight /BMI 09/15/2014 06/04/2014 03/05/2014  WEIGHT 239 lb 0.6 oz 242 lb 240 lb  HEIGHT 5\' 6"  5\' 6"  5\' 6"   BMI 38.6 kg/m2 39.08 kg/m2 38.76 kg/m2  Some encounter information is confidential and restricted. Go to Review Flowsheets activity to see all data.    Current exercise per week 90 minutes.   Depression with anxiety Improved since seeing psych and I am sure that she now has the support to continue to do so  Insomnia due to mental disorder Inadequate management with,  trazodone 50 mg dose, sleep hygiene reviewed, med management by psych, will send a message

## 2014-09-16 ENCOUNTER — Encounter: Payer: Self-pay | Admitting: Family Medicine

## 2014-09-16 ENCOUNTER — Other Ambulatory Visit (HOSPITAL_COMMUNITY): Payer: Self-pay | Admitting: Psychiatry

## 2014-09-16 LAB — COMPLETE METABOLIC PANEL WITH GFR
ALT: 10 U/L (ref 0–35)
AST: 12 U/L (ref 0–37)
Albumin: 3.7 g/dL (ref 3.5–5.2)
Alkaline Phosphatase: 87 U/L (ref 39–117)
BUN: 9 mg/dL (ref 6–23)
CALCIUM: 9.2 mg/dL (ref 8.4–10.5)
CHLORIDE: 102 meq/L (ref 96–112)
CO2: 29 meq/L (ref 19–32)
CREATININE: 0.71 mg/dL (ref 0.50–1.10)
GFR, Est African American: >89 mL/min
GLUCOSE: 100 mg/dL — AB (ref 70–99)
Potassium: 3.8 mEq/L (ref 3.5–5.3)
SODIUM: 139 meq/L (ref 135–145)
Total Bilirubin: 0.2 mg/dL (ref 0.2–1.2)
Total Protein: 6.4 g/dL (ref 6.0–8.3)

## 2014-09-16 LAB — TSH: TSH: 0.633 u[IU]/mL (ref 0.350–4.500)

## 2014-09-16 LAB — HEMOGLOBIN A1C
HEMOGLOBIN A1C: 6.1 % — AB (ref <5.7)
MEAN PLASMA GLUCOSE: 128 mg/dL — AB (ref <117)

## 2014-09-16 LAB — HIV ANTIBODY (ROUTINE TESTING W REFLEX): HIV: NONREACTIVE

## 2014-09-16 MED ORDER — TRAZODONE HCL 100 MG PO TABS: 100.0000 mg | ORAL_TABLET | Freq: Every day | ORAL | Status: DC

## 2014-09-16 NOTE — Telephone Encounter (Signed)
100mg dose sent in.  

## 2014-09-26 ENCOUNTER — Encounter (HOSPITAL_COMMUNITY): Payer: Self-pay | Admitting: Psychiatry

## 2014-09-26 ENCOUNTER — Ambulatory Visit (INDEPENDENT_AMBULATORY_CARE_PROVIDER_SITE_OTHER): Payer: Medicare Other | Admitting: Psychiatry

## 2014-09-26 VITALS — BP 115/82 | HR 85 | Ht 66.0 in | Wt 239.2 lb

## 2014-09-26 DIAGNOSIS — F331 Major depressive disorder, recurrent, moderate: Secondary | ICD-10-CM | POA: Diagnosis not present

## 2014-09-26 MED ORDER — VENLAFAXINE HCL ER 150 MG PO CP24: 150.0000 mg | ORAL_CAPSULE | Freq: Every day | ORAL | Status: DC

## 2014-09-26 MED ORDER — TRAZODONE HCL 100 MG PO TABS: 100.0000 mg | ORAL_TABLET | Freq: Every day | ORAL | Status: DC

## 2014-09-26 MED ORDER — ALPRAZOLAM 1 MG PO TABS: 1.0000 mg | ORAL_TABLET | Freq: Three times a day (TID) | ORAL | Status: DC
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
## 2014-10-01 ENCOUNTER — Other Ambulatory Visit: Payer: Self-pay | Admitting: Family Medicine

## 2014-10-31 ENCOUNTER — Encounter (HOSPITAL_COMMUNITY): Payer: Self-pay | Admitting: Emergency Medicine

## 2014-10-31 ENCOUNTER — Emergency Department (HOSPITAL_COMMUNITY)
Admission: EM | Admit: 2014-10-31 | Discharge: 2014-10-31 | Disposition: A | Discharge status: Home / Self Care | Payer: Medicare Other | Attending: Emergency Medicine | Admitting: Emergency Medicine

## 2014-10-31 DIAGNOSIS — S79922A Unspecified injury of left thigh, initial encounter: Secondary | ICD-10-CM | POA: Diagnosis not present

## 2014-10-31 DIAGNOSIS — W19XXXA Unspecified fall, initial encounter: Secondary | ICD-10-CM

## 2014-10-31 DIAGNOSIS — Z79899 Other long term (current) drug therapy: Secondary | ICD-10-CM | POA: Insufficient documentation

## 2014-10-31 DIAGNOSIS — I10 Essential (primary) hypertension: Secondary | ICD-10-CM | POA: Insufficient documentation

## 2014-10-31 DIAGNOSIS — Y9289 Other specified places as the place of occurrence of the external cause: Secondary | ICD-10-CM | POA: Insufficient documentation

## 2014-10-31 DIAGNOSIS — S59902A Unspecified injury of left elbow, initial encounter: Secondary | ICD-10-CM | POA: Diagnosis present

## 2014-10-31 DIAGNOSIS — Z87891 Personal history of nicotine dependence: Secondary | ICD-10-CM | POA: Diagnosis not present

## 2014-10-31 DIAGNOSIS — Z8739 Personal history of other diseases of the musculoskeletal system and connective tissue: Secondary | ICD-10-CM | POA: Diagnosis not present

## 2014-10-31 DIAGNOSIS — K219 Gastro-esophageal reflux disease without esophagitis: Secondary | ICD-10-CM | POA: Diagnosis not present

## 2014-10-31 DIAGNOSIS — W01198A Fall on same level from slipping, tripping and stumbling with subsequent striking against other object, initial encounter: Secondary | ICD-10-CM | POA: Insufficient documentation

## 2014-10-31 DIAGNOSIS — F418 Other specified anxiety disorders: Secondary | ICD-10-CM | POA: Insufficient documentation

## 2014-10-31 DIAGNOSIS — Y998 Other external cause status: Secondary | ICD-10-CM | POA: Insufficient documentation

## 2014-10-31 DIAGNOSIS — E669 Obesity, unspecified: Secondary | ICD-10-CM | POA: Diagnosis not present

## 2014-10-31 DIAGNOSIS — Y9389 Activity, other specified: Secondary | ICD-10-CM | POA: Diagnosis not present

## 2014-10-31 DIAGNOSIS — S50312A Abrasion of left elbow, initial encounter: Secondary | ICD-10-CM | POA: Diagnosis not present

## 2014-10-31 MED ORDER — TRAMADOL HCL 50 MG PO TABS
50.0000 mg | ORAL_TABLET | Freq: Once | ORAL | Status: AC
  Administered 2014-10-31: 50 mg via ORAL
  Filled 2014-10-31: qty 1

## 2014-10-31 MED ORDER — TRAMADOL HCL 50 MG PO TABS: 50.0000 mg | ORAL_TABLET | Freq: Four times a day (QID) | ORAL | Status: DC | PRN

## 2014-10-31 NOTE — ED Notes (Addendum)
Patient states she fell approximately 40 minutes ago. States when she fell her left elbow "scraped against the wall." Patient complaining of pain in left elbow and left outer thigh. Patient denies head injury or LOC. Patient ambulatory with no assistance at triage.

## 2014-10-31 NOTE — ED Provider Notes (Signed)
CSN: [id number]     Arrival date & time 10/31/14  1848 History   First MD Initiated Contact with Patient 10/31/14 1944     Chief Complaint  Patient presents with  . Fall  . Arm Pain  . Leg Pain     (Consider location/radiation/quality/duration/timing/severity/associated sxs/prior Treatment) HPI Comments: Patient presents today with left elbow pain and left upper leg pain that has been present since a fall that occurred around 5:30 PM this evening.  She states that she tripped and hit her left upper leg and elbow on a wall and then landed on her buttocks.  She denies hitting her head or LOC.  She states that she has been ambulatory since the fall.  She sustained a small abrasion of the left elbow.  She has not taken anything for pain prior to arrival.  She states that her Tetanus is UTD.  Denies numbness, tingling, nausea, vomiting, or vision changes.    The history is provided by the patient.    Past Medical History  Diagnosis Date  . Hypertension 1996  . Depression with anxiety   . GERD (gastroesophageal reflux disease)   . Scleroderma 1998  . Obesity   . Systemic lupus erythematosus 1998    Treated at [facility]  . Tobacco abuse, in remission     10-pack-years discontinued in 2007  . Anxiety   . Allergic reaction   . Urinary frequency   . Peripheral vascular disease   . Depression     h/o suicidal ideation in 2011  . Prediabetes 2013  . Allergy     perrenial    Past Surgical History  Procedure Laterality Date  . Finger amputation      Third finger, bilateral, distal  . Abdominal hysterectomy  1990    Neoplasm  . Cesarean section      x2  . Colonoscopy  2005  . Vascular surgery      Hands, bilaterally, [facility]  . Nasal sinus surgery  09/06/2011    Procedure: ENDOSCOPIC SINUS SURGERY;  Surgeon: Ascencion Dike, MD;  Location: [facility];  Service: ENT;  Laterality: N/A;  Nasal cerebrospinal fluid leak repair with Left temporalis fascia graft and Left  Ear cartilage graft   Family History  Problem Relation Age of Onset  . Arthritis Mother     Rheumatoid  . Dementia Mother   . Hypertension Mother   . Cirrhosis Father   . Alcohol abuse Father   . Arthritis Maternal Grandmother   . Drug abuse Sister    Social History  Substance Use Topics  . Smoking status: Former Smoker    Types: Cigarettes    Quit date: 10/05/2009  . Smokeless tobacco: Never Used  . Alcohol Use: No     Comment: quit in 2004   OB History    Gravida Para Term Preterm AB TAB SAB Ectopic Multiple Living   4 2   2  2   2      Review of Systems  All other systems reviewed and are negative.     Allergies  Bee venom; Sesame oil; Molds & smuts; and Codeine  Home Medications   Prior to Admission medications   Medication Sig Start Date End Date Taking? Authorizing Provider  ALPRAZolam Duanne Moron) 1 MG tablet Take 1 tablet (1 mg total) by mouth 3 (three) times daily. 09/26/14   Cloria Spring, MD  amLODipine (NORVASC) 10 MG tablet TAKE ONE (1) TABLET EACH DAY 08/18/14  Fayrene Helper, MD  Calcium Carbonate-Vit D-Min (CALCIUM 1200) 1200-1000 MG-UNIT CHEW Chew 1 tablet by mouth daily.    Historical Provider, MD  NEXIUM 40 MG capsule TAKE ONE (1) CAPSULE EACH DAY BEFORE BREAKFAST 09/09/14   Fayrene Helper, MD  oxybutynin (DITROPAN-XL) 5 MG 24 hr tablet TAKE ONE (1) TABLET EACH DAY 10/01/14   Fayrene Helper, MD  traZODone (DESYREL) 100 MG tablet Take 1 tablet (100 mg total) by mouth at bedtime. 09/26/14   Cloria Spring, MD  venlafaxine XR (EFFEXOR XR) 150 MG 24 hr capsule Take 1 capsule (150 mg total) by mouth daily with breakfast. 09/26/14   Cloria Spring, MD   BP 115/95 mmHg  Pulse 100  Temp(Src) 98.6 F (37 C) (Oral)  Resp 16  Ht 5\' 7"  (1.702 m)  Wt 186 lb (84.369 kg)  BMI 29.12 kg/m2  SpO2 98% Physical Exam  Constitutional: She appears well-developed and well-nourished.  HENT:  Head: Normocephalic and atraumatic.  Neck: Normal range of motion.  Neck supple.  Cardiovascular: Normal rate, regular rhythm and normal heart sounds.   Pulmonary/Chest: Effort normal and breath sounds normal.  Musculoskeletal: Normal range of motion.       Left shoulder: She exhibits normal range of motion, no tenderness and no bony tenderness.       Left elbow: She exhibits normal range of motion and no swelling. Tenderness found.       Left wrist: She exhibits normal range of motion and no bony tenderness.       Cervical back: She exhibits normal range of motion, no tenderness, no bony tenderness, no swelling, no edema and no deformity.       Thoracic back: She exhibits normal range of motion, no tenderness, no bony tenderness, no swelling, no edema and no deformity.       Lumbar back: She exhibits normal range of motion, no tenderness, no bony tenderness, no swelling, no edema and no deformity.  Mild tenderness to palpation of the left elbow Small very superficial abrasion of the left elbow.  No active bleeding.  Neurological: She is alert.  Skin: Skin is warm and dry.  No bruising of the left leg  Psychiatric: She has a normal mood and affect.  Nursing note and vitals reviewed.   ED Course  Procedures (including critical care time) Labs Review Labs Reviewed - No data to display  Imaging Review No results found. I have personally reviewed and evaluated these images and lab results as part of my medical decision-making.   EKG Interpretation None      MDM   Final diagnoses:  None   Patient presents today with left upper leg pain and left elbow pain.  Patient declined xrays.  She is ambulatory.  Full ROM of extremities.  Neurovascularly intact.  Stable for discharge.  Return precautions given.    Hyman Bible, PA-C 11/02/14 [place], MD 11/02/14 1450

## 2014-11-04 ENCOUNTER — Other Ambulatory Visit: Payer: Self-pay | Admitting: Family Medicine

## 2014-11-18 ENCOUNTER — Ambulatory Visit (INDEPENDENT_AMBULATORY_CARE_PROVIDER_SITE_OTHER): Payer: Medicare Other | Admitting: Urology

## 2014-11-18 ENCOUNTER — Ambulatory Visit (INDEPENDENT_AMBULATORY_CARE_PROVIDER_SITE_OTHER): Payer: Medicare Other

## 2014-11-18 DIAGNOSIS — N3946 Mixed incontinence: Secondary | ICD-10-CM | POA: Diagnosis not present

## 2014-11-18 DIAGNOSIS — Z23 Encounter for immunization: Secondary | ICD-10-CM | POA: Diagnosis not present

## 2014-11-27 ENCOUNTER — Telehealth (HOSPITAL_COMMUNITY): Payer: Self-pay | Admitting: *Deleted

## 2014-11-27 ENCOUNTER — Ambulatory Visit (HOSPITAL_COMMUNITY): Payer: Self-pay | Admitting: Psychiatry

## 2014-12-10 ENCOUNTER — Other Ambulatory Visit: Payer: Self-pay | Admitting: Family Medicine

## 2014-12-17 ENCOUNTER — Other Ambulatory Visit: Payer: Self-pay | Admitting: Family Medicine

## 2014-12-29 ENCOUNTER — Telehealth (HOSPITAL_COMMUNITY): Payer: Self-pay | Admitting: *Deleted

## 2014-12-29 NOTE — Telephone Encounter (Signed)
You may call in a 30 day supply 

## 2014-12-29 NOTE — Telephone Encounter (Signed)
Opened in Error.

## 2014-12-29 NOTE — Telephone Encounter (Signed)
Pt called stating that she need refills for her Xanax. Pt have an appt tomorrow 12-30-14 at 1pm. Informed pt that she can get printed script at that time but pt stated she went the entire weekend without her Xanax and now she have to wait until tomorrow at 1:00pm. Pt request for Xanax was received today.

## 2014-12-29 NOTE — Telephone Encounter (Signed)
Phone call from patient, she need refill of Xanax.    She has an appointment tomorrow at 1:00

## 2014-12-29 NOTE — Telephone Encounter (Signed)
Lmtcb, number provided 

## 2014-12-30 ENCOUNTER — Encounter (HOSPITAL_COMMUNITY): Payer: Self-pay | Admitting: Psychiatry

## 2014-12-30 ENCOUNTER — Telehealth (HOSPITAL_COMMUNITY): Payer: Self-pay | Admitting: *Deleted

## 2014-12-30 ENCOUNTER — Ambulatory Visit (INDEPENDENT_AMBULATORY_CARE_PROVIDER_SITE_OTHER): Payer: Medicare Other | Admitting: Psychiatry

## 2014-12-30 VITALS — BP 117/87 | HR 101 | Ht 67.0 in | Wt 238.0 lb

## 2014-12-30 DIAGNOSIS — F331 Major depressive disorder, recurrent, moderate: Secondary | ICD-10-CM | POA: Diagnosis not present

## 2014-12-30 MED ORDER — ALPRAZOLAM 1 MG PO TABS: 1.0000 mg | ORAL_TABLET | Freq: Three times a day (TID) | ORAL | Status: DC

## 2014-12-30 MED ORDER — TRAZODONE HCL 100 MG PO TABS: 100.0000 mg | ORAL_TABLET | Freq: Every day | ORAL | Status: DC

## 2014-12-30 MED ORDER — VENLAFAXINE HCL ER 150 MG PO CP24: 150.0000 mg | ORAL_CAPSULE | Freq: Every day | ORAL | Status: DC

## 2014-12-30 NOTE — Telephone Encounter (Signed)
Called in pt medication to pharmacy.  
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
## 2014-12-30 NOTE — Telephone Encounter (Signed)
Spoke with Melissa at [facility] and called in pt script. Informed Melissa that per Dr. Harrington Challenger to call it in and Melissa showed understanding.

## 2015-01-28 ENCOUNTER — Other Ambulatory Visit: Payer: Self-pay | Admitting: Family Medicine

## 2015-01-28 DIAGNOSIS — Z1231 Encounter for screening mammogram for malignant neoplasm of breast: Secondary | ICD-10-CM

## 2015-02-02 ENCOUNTER — Ambulatory Visit (HOSPITAL_COMMUNITY): Payer: Self-pay

## 2015-02-04 ENCOUNTER — Ambulatory Visit (HOSPITAL_COMMUNITY)
Admission: RE | Admit: 2015-02-04 | Discharge: 2015-02-04 | Disposition: A | Discharge status: Home / Self Care | Payer: Medicare Other | Source: Ambulatory Visit | Attending: Family Medicine | Admitting: Family Medicine

## 2015-02-04 DIAGNOSIS — Z1231 Encounter for screening mammogram for malignant neoplasm of breast: Secondary | ICD-10-CM | POA: Insufficient documentation

## 2015-02-12 ENCOUNTER — Encounter: Payer: Self-pay | Admitting: Family Medicine

## 2015-02-13 ENCOUNTER — Ambulatory Visit (HOSPITAL_COMMUNITY): Payer: Self-pay | Admitting: Psychiatry

## 2015-02-17 ENCOUNTER — Ambulatory Visit: Payer: Self-pay | Admitting: Urology

## 2015-03-05 ENCOUNTER — Other Ambulatory Visit: Payer: Self-pay | Admitting: Family Medicine

## 2015-03-13 ENCOUNTER — Telehealth (HOSPITAL_COMMUNITY): Payer: Self-pay | Admitting: *Deleted

## 2015-03-27 ENCOUNTER — Ambulatory Visit (INDEPENDENT_AMBULATORY_CARE_PROVIDER_SITE_OTHER): Payer: Medicare Other | Admitting: Family Medicine

## 2015-03-27 ENCOUNTER — Encounter: Payer: Self-pay | Admitting: Family Medicine

## 2015-03-27 VITALS — BP 110/82 | HR 100 | Resp 18 | Ht 66.0 in | Wt 245.0 lb

## 2015-03-27 DIAGNOSIS — E559 Vitamin D deficiency, unspecified: Secondary | ICD-10-CM

## 2015-03-27 DIAGNOSIS — Z0000 Encounter for general adult medical examination without abnormal findings: Secondary | ICD-10-CM

## 2015-03-27 DIAGNOSIS — Z1159 Encounter for screening for other viral diseases: Secondary | ICD-10-CM

## 2015-03-27 DIAGNOSIS — I10 Essential (primary) hypertension: Secondary | ICD-10-CM | POA: Diagnosis not present

## 2015-03-27 DIAGNOSIS — R7302 Impaired glucose tolerance (oral): Secondary | ICD-10-CM | POA: Diagnosis not present

## 2015-03-27 NOTE — Patient Instructions (Addendum)
F/u in 3.5 month, call if t you need me before  Fasting labs as soon as possible  Please work on good  health habits so that your health will improve. 1. Commitment to daily physical activity for 30 to 60  minutes, if you are able to do this.  2. Commitment to wise food choices. Aim for half of your  food intake to be vegetable and fruit, one quarter starchy foods, and one quarter protein. Try to eat on a regular schedule  3 meals per day, snacking between meals should be limited to vegetables or fruits or small portions of nuts. 64 ounces of water per day is generally recommended, unless you have specific health conditions, like heart failure or kidney failure where you will need to limit fluid intake.  3. Commitment to sufficient and a  good quality of physical and mental rest daily, generally between 6 to 8 hours per day.  WITH PERSISTANCE AND PERSEVERANCE, THE IMPOSSIBLE , BECOMES THE NORM!   I will be in touch with dr Gala Romney and get back with you re colon cancer screening available , you need testing

## 2015-03-27 NOTE — Progress Notes (Signed)
Subjective:    Patient ID: Jenna Woods, female    DOB: November 29, 1960, 55 y.o.   MRN: [id number]  HPI  Preventive Screening-Counseling & Management   Patient present here today for a Medicare annual wellness visit.   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 Yes Over the past two weeks, have you felt little interest or pleasure in doing things? Yes Have you lost interest or pleasure in daily life? Yes Do you often feel hopeless? Yes Do you cry easily over simple problems? No  Treated by psychiatry, not suicidal or homicidal  Activities of Daily Living  In your present state of health, do you have any difficulty performing the following activities?  Driving?: No Managing money?: No Feeding yourself?:No Getting from bed to chair?:No Climbing a flight of stairs?:No Preparing food and eating?:No Bathing or showering?:No Getting dressed?:No Getting to the toilet?:No Using the toilet?:No Moving around from place to place?: No  Fall Risk Assessment In the past year have you fallen or had a near fall?:No Are you currently taking any medications that make you dizzy?:No   Hearing Difficulties: No Do you often ask people to speak up or repeat themselves?:No Do you experience ringing or noises in your ears?:No Do you have difficulty understanding soft or whispered voices?:No  Cognitive Testing  Alert? Yes Normal Appearance?Yes  Oriented to  person? Yes Place? Yes  Time? Yes  Displays appropriate judgment?Yes  Can read the correct time from a watch face? yes Are you having problems remembering things?No  Advanced Directives have been discussed with the patient?Yes , full code, and forms provided    List the Names of Other Physician/Practitioners you currently use:  careteams up to date    Indicate any recent Medical Services you may have received from other than [facility] providers in the past year (date may be approximate).   Assessment:    Annual Wellness Exam   Plan:     Medicare Attestation  I have personally reviewed:  The patient's medical and social history  Their use of alcohol, tobacco or illicit drugs  Their current medications and supplements  The patient's functional ability including ADLs,fall risks, home safety risks, cognitive, and hearing and visual impairment  Diet and physical activities  Evidence for depression or mood disorders  The patient's weight, height, BMI, and visual acuity have been recorded in the chart. I have made referrals, counseling, and provided education to the patient based on review of the above and I have provided the patient with a written personalized care plan for preventive services.     Review of Systems     Objective:   Physical Exam BP 110/82 mmHg  Pulse 100  Resp 18  Ht 5\' 6"  (1.676 m)  Wt 245 lb (111.131 kg)  BMI 39.56 kg/m2  SpO2 97%        Assessment & Plan:  Medicare annual wellness visit, subsequent Annual exam as documented. Counseling done  re healthy lifestyle involving commitment to 150 minutes exercise per week, heart healthy diet, and attaining healthy weight.The importance of adequate sleep also discussed. Regular seat belt use and home safety, is also discussed. Changes in health habits are decided on by the patient with goals and time frames  set for achieving them. Immunization and cancer screening needs are specifically addressed at this  visit.

## 2015-03-28 DIAGNOSIS — Z0289 Encounter for other administrative examinations: Secondary | ICD-10-CM | POA: Insufficient documentation

## 2015-03-28 NOTE — Assessment & Plan Note (Signed)

## 2015-03-30 ENCOUNTER — Ambulatory Visit (HOSPITAL_COMMUNITY): Payer: Self-pay | Admitting: Psychiatry

## 2015-04-02 DIAGNOSIS — Z1159 Encounter for screening for other viral diseases: Secondary | ICD-10-CM | POA: Diagnosis not present

## 2015-04-02 DIAGNOSIS — I10 Essential (primary) hypertension: Secondary | ICD-10-CM | POA: Diagnosis not present

## 2015-04-02 DIAGNOSIS — R7302 Impaired glucose tolerance (oral): Secondary | ICD-10-CM | POA: Diagnosis not present

## 2015-04-02 DIAGNOSIS — E559 Vitamin D deficiency, unspecified: Secondary | ICD-10-CM | POA: Diagnosis not present

## 2015-04-03 LAB — VITAMIN D 25 HYDROXY (VIT D DEFICIENCY, FRACTURES): Vit D, 25-Hydroxy: 26 ng/mL — ABNORMAL LOW (ref 30–100)

## 2015-04-03 LAB — CBC
HCT: 38.7 % (ref 36.0–46.0)
Hemoglobin: 12.7 g/dL (ref 12.0–15.0)
MCH: 29 pg (ref 26.0–34.0)
MCHC: 32.8 g/dL (ref 30.0–36.0)
MCV: 88.4 fL (ref 78.0–100.0)
MPV: 9.8 fL (ref 8.6–12.4)
PLATELETS: 400 10*3/uL (ref 150–400)
RBC: 4.38 MIL/uL (ref 3.87–5.11)
RDW: 13.4 % (ref 11.5–15.5)
WBC: 10 10*3/uL (ref 4.0–10.5)

## 2015-04-03 LAB — LIPID PANEL
CHOL/HDL RATIO: 2.7 ratio (ref <=5.0)
CHOLESTEROL: 109 mg/dL — AB (ref 125–200)
HDL: 40 mg/dL — AB (ref >=46)
LDL Cholesterol: 45 mg/dL (ref <130)
Triglycerides: 118 mg/dL (ref <150)
VLDL: 24 mg/dL (ref <30)

## 2015-04-03 LAB — HEMOGLOBIN A1C
HEMOGLOBIN A1C: 6.4 % — AB (ref <5.7)
Mean Plasma Glucose: 137 mg/dL — ABNORMAL HIGH (ref <117)

## 2015-04-03 LAB — BASIC METABOLIC PANEL
BUN: 14 mg/dL (ref 7–25)
CALCIUM: 8.9 mg/dL (ref 8.6–10.4)
CO2: 27 mmol/L (ref 20–31)
Chloride: 107 mmol/L (ref 98–110)
Creat: 0.87 mg/dL (ref 0.50–1.05)
GLUCOSE: 91 mg/dL (ref 65–99)
POTASSIUM: 4 mmol/L (ref 3.5–5.3)
Sodium: 140 mmol/L (ref 135–146)

## 2015-04-03 LAB — HEPATITIS C ANTIBODY: HCV Ab: NEGATIVE

## 2015-04-08 ENCOUNTER — Ambulatory Visit (INDEPENDENT_AMBULATORY_CARE_PROVIDER_SITE_OTHER): Payer: Medicare Other | Admitting: Psychiatry

## 2015-04-08 ENCOUNTER — Encounter (HOSPITAL_COMMUNITY): Payer: Self-pay | Admitting: Psychiatry

## 2015-04-08 VITALS — BP 124/96 | HR 100 | Ht 66.0 in | Wt 244.4 lb

## 2015-04-08 DIAGNOSIS — F331 Major depressive disorder, recurrent, moderate: Secondary | ICD-10-CM

## 2015-04-08 MED ORDER — ALPRAZOLAM 1 MG PO TABS: 1.0000 mg | ORAL_TABLET | Freq: Three times a day (TID) | ORAL | Status: DC

## 2015-04-08 MED ORDER — VENLAFAXINE HCL ER 150 MG PO CP24: 150.0000 mg | ORAL_CAPSULE | Freq: Every day | ORAL | Status: DC

## 2015-04-08 MED ORDER — TRAZODONE HCL 100 MG PO TABS: 100.0000 mg | ORAL_TABLET | Freq: Every day | ORAL | Status: DC
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
## 2015-04-16 ENCOUNTER — Other Ambulatory Visit: Payer: Self-pay | Admitting: Family Medicine

## 2015-06-29 ENCOUNTER — Other Ambulatory Visit: Payer: Self-pay | Admitting: Family Medicine

## 2015-07-06 ENCOUNTER — Encounter (HOSPITAL_COMMUNITY): Payer: Self-pay | Admitting: Psychiatry

## 2015-07-06 ENCOUNTER — Ambulatory Visit (INDEPENDENT_AMBULATORY_CARE_PROVIDER_SITE_OTHER): Payer: Medicare Other | Admitting: Psychiatry

## 2015-07-06 VITALS — BP 109/87 | HR 90 | Ht 66.0 in | Wt 237.8 lb

## 2015-07-06 DIAGNOSIS — F331 Major depressive disorder, recurrent, moderate: Secondary | ICD-10-CM | POA: Diagnosis not present

## 2015-07-06 MED ORDER — TRAZODONE HCL 100 MG PO TABS: 100.0000 mg | ORAL_TABLET | Freq: Every day | ORAL | Status: DC

## 2015-07-06 MED ORDER — ALPRAZOLAM 1 MG PO TABS: 1.0000 mg | ORAL_TABLET | Freq: Three times a day (TID) | ORAL | Status: DC
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
## 2015-07-14 ENCOUNTER — Ambulatory Visit (INDEPENDENT_AMBULATORY_CARE_PROVIDER_SITE_OTHER): Payer: Medicare Other | Admitting: Family Medicine

## 2015-07-14 ENCOUNTER — Encounter: Payer: Self-pay | Admitting: Family Medicine

## 2015-07-14 VITALS — BP 118/68 | HR 94 | Resp 18 | Ht 66.0 in | Wt 240.0 lb

## 2015-07-14 DIAGNOSIS — R7302 Impaired glucose tolerance (oral): Secondary | ICD-10-CM | POA: Diagnosis not present

## 2015-07-14 DIAGNOSIS — F418 Other specified anxiety disorders: Secondary | ICD-10-CM

## 2015-07-14 DIAGNOSIS — Z1211 Encounter for screening for malignant neoplasm of colon: Secondary | ICD-10-CM | POA: Diagnosis not present

## 2015-07-14 DIAGNOSIS — I10 Essential (primary) hypertension: Secondary | ICD-10-CM

## 2015-07-14 DIAGNOSIS — E559 Vitamin D deficiency, unspecified: Secondary | ICD-10-CM | POA: Diagnosis not present

## 2015-07-14 DIAGNOSIS — K219 Gastro-esophageal reflux disease without esophagitis: Secondary | ICD-10-CM

## 2015-07-14 DIAGNOSIS — N39498 Other specified urinary incontinence: Secondary | ICD-10-CM

## 2015-07-14 MED ORDER — METFORMIN HCL 500 MG PO TABS: 500.0000 mg | ORAL_TABLET | Freq: Two times a day (BID) | ORAL | Status: DC

## 2015-07-14 MED ORDER — AMLODIPINE BESYLATE 10 MG PO TABS: ORAL_TABLET | ORAL | Status: DC

## 2015-07-14 NOTE — Progress Notes (Signed)
Subjective:    Patient ID: Jenna Woods, female    DOB: Jul 10, 1960, 55 y.o.   MRN: [id number]  HPI   Jenna Woods     MRN: [id number]      DOB: 05/18/1960   HPI Jenna Woods is here for follow up and re-evaluation of chronic medical conditions, medication management and review of any available recent lab and radiology data.  Preventive health is updated, specifically  Cancer screening and Immunization.   Questions or concerns regarding consultations or procedures which the PT has had in the interim are  addressed. The PT denies any adverse reactions to current medications since the last visit.  There are no new concerns.  There are no specific complaints   ROS Denies recent fever or chills. Denies sinus pressure, nasal congestion, ear pain or sore throat. Denies chest congestion, productive cough or wheezing. Denies chest pains, palpitations and leg swelling Denies abdominal pain, nausea, vomiting,diarrhea or constipation.   Denies dysuria, frequency, hesitancy or incontinence. Denies joint pain, swelling and limitation in mobility. Denies headaches, seizures, numbness, or tingling. Denies uncontrolled depression, anxiety or insomnia. Denies skin break down or rash.   PE  BP 118/68 mmHg  Pulse 94  Resp 18  Ht 5\' 6"  (1.676 m)  Wt 240 lb (108.863 kg)  BMI 38.76 kg/m2  SpO2 96%  Patient alert and oriented and in no cardiopulmonary distress.  HEENT: No facial asymmetry, EOMI,   oropharynx pink and moist.  Neck supple no JVD, no mass.  Chest: Clear to auscultation bilaterally.  CVS: S1, S2 no murmurs, no S3.Regular rate.  ABD: Soft non tender.   Ext: No edema  MS: Adequate ROM spine, shoulders, hips and knees.  Skin: Intact, no ulcerations or rash noted.  Psych: Good eye contact, normal affect. Memory intact not anxious or depressed appearing.  CNS: CN 2-12 intact, power,  normal throughout.no focal deficits noted.   Assessment &  Plan   Hypertension Controlled, no change in medication DASH diet and commitment to daily physical activity for a minimum of 30 minutes discussed and encouraged, as a part of hypertension management. The importance of attaining a healthy weight is also discussed.  BP/Weight 07/14/2015 03/27/2015 10/31/2014 09/15/2014 06/04/2014 03/05/2014 [id number]  Systolic BP 123456 [id number]  Diastolic BP 68 82 95 76 72 74 82  Wt. (Lbs) 240 245 186 239.04 242 240 213.8  BMI 38.76 39.56 29.12 38.6 39.08 38.76 34.52  Some encounter information is confidential and restricted. Go to Review Flowsheets activity to see all data.        IGT (impaired glucose tolerance) Deteriorated , start metformin Jenna Woods is reminded of the importance of commitment to daily physical activity for 30 minutes or more, as able and the need to limit carbohydrate intake to 30 to 60 grams per meal to help with blood sugar control.   The need to take medication as prescribed, test blood sugar as directed, and to call between visits if there is a concern that blood sugar is uncontrolled is also discussed.   Jenna Woods is reminded of the importance of daily foot exam, annual eye examination, and good blood sugar, blood pressure and cholesterol control.  Diabetic Labs Latest Ref Rng 04/02/2015 09/15/2014 03/03/2014 08/05/2013 04/02/2013  HbA1c <5.7 % 6.4(H) 6.1(H) 6.2(H) 6.0(H) 5.8(H)  Chol 125 - 200 mg/dL 109(L) - 116 - -  HDL >=46 mg/dL 40(L) - 45 - -  Calc LDL <130 mg/dL 45 - 56 - -  Triglycerides <150 mg/dL 118 - 75 - -  Creatinine 0.50 - 1.05 mg/dL 0.87 0.71 0.71 0.68 -   BP/Weight 07/14/2015 03/27/2015 10/31/2014 09/15/2014 06/04/2014 03/05/2014 [id number]  Systolic BP 123456 [id number]  Diastolic BP 68 82 95 76 72 74 82  Wt. (Lbs) 240 245 186 239.04 242 240 213.8  BMI 38.76 39.56 29.12 38.6 39.08 38.76 34.52  Some encounter information is confidential and restricted. Go to Review Flowsheets activity to  see all data.   No flowsheet data found.       Depression with anxiety Improved, managed by psychiatry, not suicidal or homicidal. Improved relationship with 1 of her 2 sons  Morbid obesity Improved. Pt applauded on succesful weight loss through lifestyle change, and encouraged to continue same. Weight loss goal set for the next several months.   Urinary incontinence Controlled, no change in medication   GERD (gastroesophageal reflux disease) Controlled, no change in medication        Review of Systems     Objective:   Physical Exam        Assessment & Plan:

## 2015-07-14 NOTE — Patient Instructions (Addendum)
F/u in 4.5 month, call if you need me  Before  You are referred to Dr Laural Golden  Hba1c, CHEM 7 AND egfR, vIT d NON FAST IN 4.5 MONTH  START ONCE DAILY ASPIRIN 81 MG DAILY (COATED)  NEW TO HELP WITH BLOOD SUGAR AND HOPEFULLY WEIGHT LOSS, IS METFORFMIN ONE DAILY  It is important that you exercise regularly at least 30 minutes 5 times a week. If you develop chest pain, have severe difficulty breathing, or feel very tired, stop exercising immediately and seek medical attention  Thank you  for choosing [facility]. We consider it a privelige to serve you.  Delivering excellent health care in a caring and  compassionate way is our goal.  Partnering with you,  so that together we can achieve this goal is our strategy.

## 2015-07-18 NOTE — Assessment & Plan Note (Signed)
Controlled, no change in medication  

## 2015-07-18 NOTE — Assessment & Plan Note (Signed)
Improved, managed by psychiatry, not suicidal or homicidal. Improved relationship with 1 of her 2 sons

## 2015-07-18 NOTE — Assessment & Plan Note (Signed)
Controlled, no change in medication DASH diet and commitment to daily physical activity for a minimum of 30 minutes discussed and encouraged, as a part of hypertension management. The importance of attaining a healthy weight is also discussed.  BP/Weight 07/14/2015 03/27/2015 10/31/2014 09/15/2014 06/04/2014 03/05/2014 [id number]  Systolic BP 123456 [id number] 0000000 [id number]  Diastolic BP 68 82 95 76 72 74 82  Wt. (Lbs) 240 245 186 239.04 242 240 213.8  BMI 38.76 39.56 29.12 38.6 39.08 38.76 34.52  Some encounter information is confidential and restricted. Go to Review Flowsheets activity to see all data.

## 2015-07-18 NOTE — Assessment & Plan Note (Signed)
Improved. Pt applauded on succesful weight loss through lifestyle change, and encouraged to continue same. Weight loss goal set for the next several months.  

## 2015-07-18 NOTE — Assessment & Plan Note (Signed)
Deteriorated , start metformin Jenna Woods is reminded of the importance of commitment to daily physical activity for 30 minutes or more, as able and the need to limit carbohydrate intake to 30 to 60 grams per meal to help with blood sugar control.   The need to take medication as prescribed, test blood sugar as directed, and to call between visits if there is a concern that blood sugar is uncontrolled is also discussed.   Jenna Woods is reminded of the importance of daily foot exam, annual eye examination, and good blood sugar, blood pressure and cholesterol control.  Diabetic Labs Latest Ref Rng 04/02/2015 09/15/2014 03/03/2014 08/05/2013 04/02/2013  HbA1c <5.7 % 6.4(H) 6.1(H) 6.2(H) 6.0(H) 5.8(H)  Chol 125 - 200 mg/dL 109(L) - 116 - -  HDL >=46 mg/dL 40(L) - 45 - -  Calc LDL <130 mg/dL 45 - 56 - -  Triglycerides <150 mg/dL 118 - 75 - -  Creatinine 0.50 - 1.05 mg/dL 0.87 0.71 0.71 0.68 -   BP/Weight 07/14/2015 03/27/2015 10/31/2014 09/15/2014 06/04/2014 03/05/2014 [id number]  Systolic BP 123456 [id number]  Diastolic BP 68 82 95 76 72 74 82  Wt. (Lbs) 240 245 186 239.04 242 240 213.8  BMI 38.76 39.56 29.12 38.6 39.08 38.76 34.52  Some encounter information is confidential and restricted. Go to Review Flowsheets activity to see all data.   No flowsheet data found.

## 2015-07-21 ENCOUNTER — Encounter (INDEPENDENT_AMBULATORY_CARE_PROVIDER_SITE_OTHER): Payer: Self-pay | Admitting: *Deleted

## 2015-07-21 ENCOUNTER — Other Ambulatory Visit (INDEPENDENT_AMBULATORY_CARE_PROVIDER_SITE_OTHER): Payer: Self-pay | Admitting: Internal Medicine

## 2015-07-21 ENCOUNTER — Other Ambulatory Visit (INDEPENDENT_AMBULATORY_CARE_PROVIDER_SITE_OTHER): Payer: Self-pay | Admitting: *Deleted

## 2015-07-21 DIAGNOSIS — Z1211 Encounter for screening for malignant neoplasm of colon: Secondary | ICD-10-CM

## 2015-07-21 MED ORDER — PEG 3350-KCL-NA BICARB-NACL 420 G PO SOLR
4000.0000 mL | Freq: Once | ORAL | Status: DC
Start: 2015-07-21 — End: 2015-08-28

## 2015-07-21 NOTE — Telephone Encounter (Signed)
Patient needs trilyte 

## 2015-07-28 ENCOUNTER — Telehealth (INDEPENDENT_AMBULATORY_CARE_PROVIDER_SITE_OTHER): Payer: Self-pay | Admitting: *Deleted

## 2015-07-28 NOTE — Telephone Encounter (Signed)
Referring MD/PCP: simpson   Procedure: tcs with propofol  Reason/Indication:  screening  Has patient had this procedure before?  Yes, over 10 yrs ago  If so, when, by whom and where?    Is there a family history of colon cancer?  no  Who?  What age when diagnosed?    Is patient diabetic?   yes      Does patient have prosthetic heart valve or mechanical valve?  no  Do you have a pacemaker?  no  Has patient ever had endocarditis? no  Has patient had joint replacement within last 12 months?  no  Does patient tend to be constipated or take laxatives? no  Does patient have a history of alcohol/drug use?  no  Is patient on Coumadin, Plavix and/or Aspirin? yes  Medications: asa 81 mg daily, toviaz 8 mg daily, nexium 40 mg daily, amlodipine 10 mg daily, metformin 500 mg bid, xanax 1 mg tid  Allergies: see epic  Medication Adjustment: asa 2 days, hold metformin evening before & morning of  Procedure date & time: 08/28/15 at 1045

## 2015-07-28 NOTE — Telephone Encounter (Signed)
agree

## 2015-08-24 NOTE — Patient Instructions (Signed)
Your procedure is scheduled on: 08/28/2015  Report to [facility] at  9:00   AM.  Call this number if you have problems the morning of surgery: [phone]   Remember:   Do not drink or eat food:After Midnight.  :  Take these medicines the morning of surgery with A SIP OF WATER: Amlodipine and Nexium   Do not wear jewelry, make-up or nail polish.  Do not wear lotions, powders, or perfumes. You may wear deodorant.    Do not bring valuables to the hospital.  Contacts, dentures or bridgework may not be worn into surgery.  Leave suitcase in the car. After surgery it may be brought to your room.  For patients admitted to the hospital, checkout time is 11:00 AM the day of discharge.   Patients discharged the day of surgery will not be allowed to drive home.    Colonoscopy, Care After Refer to this sheet in the next few weeks. These instructions provide you with information on caring for yourself after your procedure. Your health care provider may also give you more specific instructions. Your treatment has been planned according to current medical practices, but problems sometimes occur. Call your health care provider if you have any problems or questions after your procedure. WHAT TO EXPECT AFTER THE PROCEDURE  After your procedure, it is typical to have the following:  A small amount of blood in your stool.  Moderate amounts of gas and mild abdominal cramping or bloating. HOME CARE INSTRUCTIONS  Do not drive, operate machinery, or sign important documents for 24 hours.  You may shower and resume your regular physical activities, but move at a slower pace for the first 24 hours.  Take frequent rest periods for the first 24 hours.  Walk around or put a warm pack on your abdomen to help reduce abdominal cramping and bloating.  Drink enough fluids to keep your urine clear or pale yellow.  You may resume your normal diet as instructed by your health care provider. Avoid heavy or  fried foods that are hard to digest.  Avoid drinking alcohol for 24 hours or as instructed by your health care provider.  Only take over-the-counter or prescription medicines as directed by your health care provider.  If a tissue sample (biopsy) was taken during your procedure:  Do not take aspirin or blood thinners for 7 days, or as instructed by your health care provider.  Do not drink alcohol for 7 days, or as instructed by your health care provider.  Eat soft foods for the first 24 hours. SEEK MEDICAL CARE IF: You have persistent spotting of blood in your stool 2-3 days after the procedure. SEEK IMMEDIATE MEDICAL CARE IF:  You have more than a small spotting of blood in your stool.  You pass large blood clots in your stool.  Your abdomen is swollen (distended).  You have nausea or vomiting.  You have a fever.  You have increasing abdominal pain that is not relieved with medicine.   This information is not intended to replace advice given to you by your health care provider. Make sure you discuss any questions you have with your health care provider.   Document Released: 10/06/2003 Document Revised: 12/12/2012 Document Reviewed: 10/29/2012 Elsevier Interactive Patient Education 2016 [place].  Anesthesia, Adult, Care After Refer to this sheet in the next few weeks. These instructions provide you with information on caring for yourself after your procedure. Your health care provider may also give you more  specific instructions. Your treatment has been planned according to current medical practices, but problems sometimes occur. Call your health care provider if you have any problems or questions after your procedure. WHAT TO EXPECT AFTER THE PROCEDURE After the procedure, it is typical to experience:  Sleepiness.  Nausea and vomiting. HOME CARE INSTRUCTIONS  For the first 24 hours after general anesthesia:  Have a responsible person with you.  Do not drive a car.  If you are alone, do not take public transportation.  Do not drink alcohol.  Do not take medicine that has not been prescribed by your health care provider.  Do not sign important papers or make important decisions.  You may resume a normal diet and activities as directed by your health care provider.  Change bandages (dressings) as directed.  If you have questions or problems that seem related to general anesthesia, call the hospital and ask for the anesthetist or anesthesiologist on call. SEEK MEDICAL CARE IF:  You have nausea and vomiting that continue the day after anesthesia.  You develop a rash. SEEK IMMEDIATE MEDICAL CARE IF:   You have difficulty breathing.  You have chest pain.  You have any allergic problems.   This information is not intended to replace advice given to you by your health care provider. Make sure you discuss any questions you have with your health care provider.   Document Released: 05/30/2000 Document Revised: 03/14/2014 Document Reviewed: 06/22/2011 Elsevier Interactive Patient Education [employer].

## 2015-08-25 ENCOUNTER — Encounter (HOSPITAL_COMMUNITY)
Admission: RE | Admit: 2015-08-25 | Discharge: 2015-08-25 | Disposition: A | Discharge status: Home / Self Care | Payer: Medicare Other | Source: Ambulatory Visit | Attending: Internal Medicine | Admitting: Internal Medicine

## 2015-08-25 ENCOUNTER — Encounter (HOSPITAL_COMMUNITY): Payer: Self-pay

## 2015-08-25 VITALS — BP 113/84 | HR 103 | Temp 98.3°F | Resp 18 | Ht 67.0 in | Wt 235.0 lb

## 2015-08-25 DIAGNOSIS — Z01812 Encounter for preprocedural laboratory examination: Secondary | ICD-10-CM | POA: Diagnosis not present

## 2015-08-25 DIAGNOSIS — I10 Essential (primary) hypertension: Secondary | ICD-10-CM | POA: Diagnosis not present

## 2015-08-25 DIAGNOSIS — Z01810 Encounter for preprocedural cardiovascular examination: Secondary | ICD-10-CM | POA: Diagnosis not present

## 2015-08-25 DIAGNOSIS — Z1211 Encounter for screening for malignant neoplasm of colon: Secondary | ICD-10-CM | POA: Diagnosis not present

## 2015-08-25 DIAGNOSIS — K219 Gastro-esophageal reflux disease without esophagitis: Secondary | ICD-10-CM | POA: Diagnosis not present

## 2015-08-25 DIAGNOSIS — Z79899 Other long term (current) drug therapy: Secondary | ICD-10-CM | POA: Diagnosis not present

## 2015-08-25 DIAGNOSIS — F418 Other specified anxiety disorders: Secondary | ICD-10-CM | POA: Diagnosis not present

## 2015-08-25 DIAGNOSIS — Z7984 Long term (current) use of oral hypoglycemic drugs: Secondary | ICD-10-CM | POA: Diagnosis not present

## 2015-08-25 DIAGNOSIS — K644 Residual hemorrhoidal skin tags: Secondary | ICD-10-CM | POA: Diagnosis not present

## 2015-08-25 DIAGNOSIS — G4730 Sleep apnea, unspecified: Secondary | ICD-10-CM | POA: Diagnosis not present

## 2015-08-25 DIAGNOSIS — E669 Obesity, unspecified: Secondary | ICD-10-CM | POA: Diagnosis not present

## 2015-08-25 DIAGNOSIS — F17210 Nicotine dependence, cigarettes, uncomplicated: Secondary | ICD-10-CM | POA: Diagnosis not present

## 2015-08-25 DIAGNOSIS — K5730 Diverticulosis of large intestine without perforation or abscess without bleeding: Secondary | ICD-10-CM | POA: Diagnosis not present

## 2015-08-25 DIAGNOSIS — E119 Type 2 diabetes mellitus without complications: Secondary | ICD-10-CM | POA: Diagnosis not present

## 2015-08-25 DIAGNOSIS — Z7982 Long term (current) use of aspirin: Secondary | ICD-10-CM | POA: Diagnosis not present

## 2015-08-25 HISTORY — DX: Unspecified osteoarthritis, unspecified site: M19.90

## 2015-08-25 LAB — CBC
HEMATOCRIT: 39.9 % (ref 36.0–46.0)
HEMOGLOBIN: 13 g/dL (ref 12.0–15.0)
MCH: 29.3 pg (ref 26.0–34.0)
MCHC: 32.6 g/dL (ref 30.0–36.0)
MCV: 89.9 fL (ref 78.0–100.0)
Platelets: 354 10*3/uL (ref 150–400)
RBC: 4.44 MIL/uL (ref 3.87–5.11)
RDW: 13.4 % (ref 11.5–15.5)
WBC: 8.3 10*3/uL (ref 4.0–10.5)

## 2015-08-25 LAB — BASIC METABOLIC PANEL
Anion gap: 6 (ref 5–15)
BUN: 13 mg/dL (ref 6–20)
CHLORIDE: 107 mmol/L (ref 101–111)
CO2: 26 mmol/L (ref 22–32)
CREATININE: 0.65 mg/dL (ref 0.44–1.00)
Calcium: 9.1 mg/dL (ref 8.9–10.3)
GFR calc Af Amer: >60 mL/min (ref >60)
GFR calc non Af Amer: >60 mL/min (ref >60)
GLUCOSE: 87 mg/dL (ref 65–99)
POTASSIUM: 4.4 mmol/L (ref 3.5–5.1)
SODIUM: 139 mmol/L (ref 135–145)

## 2015-08-28 ENCOUNTER — Encounter (HOSPITAL_COMMUNITY): Payer: Self-pay | Admitting: *Deleted

## 2015-08-28 ENCOUNTER — Ambulatory Visit (HOSPITAL_COMMUNITY): Payer: Medicare Other | Admitting: Anesthesiology

## 2015-08-28 ENCOUNTER — Encounter (HOSPITAL_COMMUNITY)
Admission: RE | Disposition: A | Discharge status: Home / Self Care | Payer: Self-pay | Source: Ambulatory Visit | Attending: Internal Medicine

## 2015-08-28 ENCOUNTER — Ambulatory Visit (HOSPITAL_COMMUNITY)
Admission: RE | Admit: 2015-08-28 | Discharge: 2015-08-28 | Disposition: A | Discharge status: Home / Self Care | Payer: Medicare Other | Source: Ambulatory Visit | Attending: Internal Medicine | Admitting: Internal Medicine

## 2015-08-28 DIAGNOSIS — I10 Essential (primary) hypertension: Secondary | ICD-10-CM | POA: Insufficient documentation

## 2015-08-28 DIAGNOSIS — F418 Other specified anxiety disorders: Secondary | ICD-10-CM | POA: Insufficient documentation

## 2015-08-28 DIAGNOSIS — Z01812 Encounter for preprocedural laboratory examination: Secondary | ICD-10-CM | POA: Insufficient documentation

## 2015-08-28 DIAGNOSIS — E669 Obesity, unspecified: Secondary | ICD-10-CM | POA: Insufficient documentation

## 2015-08-28 DIAGNOSIS — G4730 Sleep apnea, unspecified: Secondary | ICD-10-CM | POA: Insufficient documentation

## 2015-08-28 DIAGNOSIS — K644 Residual hemorrhoidal skin tags: Secondary | ICD-10-CM | POA: Insufficient documentation

## 2015-08-28 DIAGNOSIS — Z7984 Long term (current) use of oral hypoglycemic drugs: Secondary | ICD-10-CM | POA: Insufficient documentation

## 2015-08-28 DIAGNOSIS — Z79899 Other long term (current) drug therapy: Secondary | ICD-10-CM | POA: Insufficient documentation

## 2015-08-28 DIAGNOSIS — Z01810 Encounter for preprocedural cardiovascular examination: Secondary | ICD-10-CM | POA: Diagnosis not present

## 2015-08-28 DIAGNOSIS — K5730 Diverticulosis of large intestine without perforation or abscess without bleeding: Secondary | ICD-10-CM | POA: Insufficient documentation

## 2015-08-28 DIAGNOSIS — K219 Gastro-esophageal reflux disease without esophagitis: Secondary | ICD-10-CM | POA: Insufficient documentation

## 2015-08-28 DIAGNOSIS — Z1211 Encounter for screening for malignant neoplasm of colon: Secondary | ICD-10-CM | POA: Diagnosis not present

## 2015-08-28 DIAGNOSIS — Z7982 Long term (current) use of aspirin: Secondary | ICD-10-CM | POA: Insufficient documentation

## 2015-08-28 DIAGNOSIS — E119 Type 2 diabetes mellitus without complications: Secondary | ICD-10-CM | POA: Insufficient documentation

## 2015-08-28 DIAGNOSIS — F17210 Nicotine dependence, cigarettes, uncomplicated: Secondary | ICD-10-CM | POA: Insufficient documentation

## 2015-08-28 HISTORY — PX: COLONOSCOPY WITH PROPOFOL: SHX5780

## 2015-08-28 LAB — GLUCOSE, CAPILLARY: GLUCOSE-CAPILLARY: 94 mg/dL (ref 65–99)

## 2015-08-28 SURGERY — COLONOSCOPY WITH PROPOFOL
Anesthesia: General

## 2015-08-28 MED ORDER — PROPOFOL 500 MG/50ML IV EMUL
INTRAVENOUS | Status: DC | PRN
  Administered 2015-08-28 (×2): via INTRAVENOUS
  Administered 2015-08-28: 125 ug/kg/min via INTRAVENOUS

## 2015-08-28 MED ORDER — FENTANYL CITRATE (PF) 100 MCG/2ML IJ SOLN
INTRAMUSCULAR | Status: AC
  Filled 2015-08-28: qty 2

## 2015-08-28 MED ORDER — ONDANSETRON HCL 4 MG/2ML IJ SOLN
4.0000 mg | Freq: Once | INTRAMUSCULAR | Status: AC
  Administered 2015-08-28: 4 mg via INTRAVENOUS

## 2015-08-28 MED ORDER — ONDANSETRON HCL 4 MG/2ML IJ SOLN
INTRAMUSCULAR | Status: AC
  Filled 2015-08-28: qty 2

## 2015-08-28 MED ORDER — LACTATED RINGERS IV SOLN
INTRAVENOUS | Status: DC
  Administered 2015-08-28: 10:00:00 via INTRAVENOUS

## 2015-08-28 MED ORDER — MIDAZOLAM HCL 5 MG/5ML IJ SOLN
INTRAMUSCULAR | Status: DC | PRN
  Administered 2015-08-28: 2 mg via INTRAVENOUS

## 2015-08-28 MED ORDER — MIDAZOLAM HCL 2 MG/2ML IJ SOLN
INTRAMUSCULAR | Status: AC
  Filled 2015-08-28: qty 2

## 2015-08-28 MED ORDER — PROPOFOL 10 MG/ML IV BOLUS
INTRAVENOUS | Status: AC
  Filled 2015-08-28: qty 20

## 2015-08-28 MED ORDER — FENTANYL CITRATE (PF) 100 MCG/2ML IJ SOLN
25.0000 ug | INTRAMUSCULAR | Status: AC
  Administered 2015-08-28 (×2): 25 ug via INTRAVENOUS

## 2015-08-28 MED ORDER — MIDAZOLAM HCL 2 MG/2ML IJ SOLN
1.0000 mg | INTRAMUSCULAR | Status: DC | PRN
  Administered 2015-08-28: 2 mg via INTRAVENOUS

## 2015-08-28 MED ORDER — PROPOFOL 10 MG/ML IV BOLUS
INTRAVENOUS | Status: AC
Start: 2015-08-28 — End: 2015-08-28
  Filled 2015-08-28: qty 20

## 2015-08-28 NOTE — Anesthesia Postprocedure Evaluation (Signed)
Anesthesia Post Note  Patient: Jenna Woods  Procedure(s) Performed: Procedure(s) (LRB): COLONOSCOPY WITH PROPOFOL (N/A)  Patient location during evaluation: PACU Anesthesia Type: MAC Level of consciousness: awake, oriented and patient cooperative Pain management: pain level controlled Vital Signs Assessment: post-procedure vital signs reviewed and stable Respiratory status: spontaneous breathing, nonlabored ventilation and respiratory function stable Cardiovascular status: blood pressure returned to baseline and stable Postop Assessment: no signs of nausea or vomiting Anesthetic complications: no    Last Vitals:  Filed Vitals:   08/28/15 1010 08/28/15 1015  BP: 102/64 108/69  Temp:    Resp: 24 18    Last Pain: There were no vitals filed for this visit.               Fernie Grimm J

## 2015-08-28 NOTE — Transfer of Care (Signed)
Immediate Anesthesia Transfer of Care Note  Patient: Jenna Woods  Procedure(s) Performed: Procedure(s) with comments: COLONOSCOPY WITH PROPOFOL (N/A) - 10:30  Patient Location: PACU  Anesthesia Type:MAC  Level of Consciousness: awake and patient cooperative  Airway & Oxygen Therapy: Patient Spontanous Breathing and Patient connected to face mask oxygen  Post-op Assessment: Report given to RN, Post -op Vital signs reviewed and stable and Patient moving all extremities  Post vital signs: Reviewed and stable  Last Vitals:  Filed Vitals:   08/28/15 1010 08/28/15 1015  BP: 102/64 108/69  Temp:    Resp: 24 18    Last Pain: There were no vitals filed for this visit.    Patients Stated Pain Goal: 8 ([id number])  Complications: No apparent anesthesia complications

## 2015-08-28 NOTE — Discharge Instructions (Addendum)
Resume usual medications and high fiber diet. No driving for 24 hours. Next screening exam in 5 years.  High-Fiber Diet Fiber, also called dietary fiber, is a type of carbohydrate found in fruits, vegetables, whole grains, and beans. A high-fiber diet can have many health benefits. Your health care provider may recommend a high-fiber diet to help:  Prevent constipation. Fiber can make your bowel movements more regular.  Lower your cholesterol.  Relieve hemorrhoids, uncomplicated diverticulosis, or irritable bowel syndrome.  Prevent overeating as part of a weight-loss plan.  Prevent heart disease, type 2 diabetes, and certain cancers. WHAT IS MY PLAN? The recommended daily intake of fiber includes:  38 grams for men under age 28.  67 grams for men over age 81.  82 grams for women under age 25.  62 grams for women over age 36. You can get the recommended daily intake of dietary fiber by eating a variety of fruits, vegetables, grains, and beans. Your health care provider may also recommend a fiber supplement if it is not possible to get enough fiber through your diet. WHAT DO I NEED TO KNOW ABOUT A HIGH-FIBER DIET?  Fiber supplements have not been widely studied for their effectiveness, so it is better to get fiber through food sources.  Always check the fiber content on thenutrition facts label of any prepackaged food. Look for foods that contain at least 5 grams of fiber per serving.  Ask your dietitian if you have questions about specific foods that are related to your condition, especially if those foods are not listed in the following section.  Increase your daily fiber consumption gradually. Increasing your intake of dietary fiber too quickly may cause bloating, cramping, or gas.  Drink plenty of water. Water helps you to digest fiber. WHAT FOODS CAN I EAT? Grains Whole-grain breads. Multigrain cereal. Oats and oatmeal. Brown rice. Barley. Bulgur wheat. [place]. Bran  muffins. Popcorn. Rye wafer crackers. Vegetables Sweet potatoes. Spinach. Kale. Artichokes. Cabbage. Broccoli. Green peas. Carrots. Squash. Fruits Berries. Pears. Apples. Oranges. Avocados. Prunes and raisins. Dried figs. Meats and Other Protein Sources [place], kidney, pinto, and soy beans. Split peas. Lentils. Nuts and seeds. Dairy Fiber-fortified yogurt. Beverages Fiber-fortified soy milk. Fiber-fortified orange juice. Other Fiber bars. The items listed above may not be a complete list of recommended foods or beverages. Contact your dietitian for more options. WHAT FOODS ARE NOT RECOMMENDED? Grains White bread. Pasta made with refined flour. White rice. Vegetables Fried potatoes. Canned vegetables. Well-cooked vegetables.  Fruits Fruit juice. Cooked, strained fruit. Meats and Other Protein Sources Fatty cuts of meat. Fried Sales executive or fried fish. Dairy Milk. Yogurt. Cream cheese. Sour cream. Beverages Soft drinks. Other Cakes and pastries. Butter and oils. The items listed above may not be a complete list of foods and beverages to avoid. Contact your dietitian for more information. WHAT ARE SOME TIPS FOR INCLUDING HIGH-FIBER FOODS IN MY DIET?  Eat a wide variety of high-fiber foods.  Make sure that half of all grains consumed each day are whole grains.  Replace breads and cereals made from refined flour or white flour with whole-grain breads and cereals.  Replace white rice with brown rice, bulgur wheat, or millet.  Start the day with a breakfast that is high in fiber, such as a cereal that contains at least 5 grams of fiber per serving.  Use beans in place of meat in soups, salads, or pasta.  Eat high-fiber snacks, such as berries, raw vegetables, nuts, or popcorn.   This  information is not intended to replace advice given to you by your health care provider. Make sure you discuss any questions you have with your health care provider.   Document Released: 02/21/2005  Document Revised: 03/14/2014 Document Reviewed: 08/06/2013 Elsevier Interactive Patient Education [employer].

## 2015-08-28 NOTE — Anesthesia Preprocedure Evaluation (Signed)
Anesthesia Evaluation  Patient identified by MRN, date of birth, ID band Patient awake    Reviewed: Allergy & Precautions, H&P , NPO status , Patient's Chart, lab work & pertinent test results  Airway Mallampati: I  TM Distance: >3 FB Neck ROM: full    Dental  (+) Teeth Intact   Pulmonary sleep apnea , Current Smoker,    breath sounds clear to auscultation       Cardiovascular hypertension, On Medications + Peripheral Vascular Disease   Rhythm:Regular Rate:Normal     Neuro/Psych PSYCHIATRIC DISORDERS Anxiety Depression negative neurological ROS     GI/Hepatic Neg liver ROS, GERD  Medicated and Controlled,  Endo/Other  diabetes (pre DM), Type 2, Oral Hypoglycemic AgentsMorbid obesity  Renal/GU negative Renal ROS  negative genitourinary   Musculoskeletal   Abdominal   Peds  Hematology negative hematology ROS (+)   Anesthesia Other Findings See surgeon's H&P   Reproductive/Obstetrics negative OB ROS                             Anesthesia Physical Anesthesia Plan  ASA: III  Anesthesia Plan: General   Post-op Pain Management:    Induction: Intravenous  Airway Management Planned: Simple Face Mask  Additional Equipment:   Intra-op Plan:   Post-operative Plan:   Informed Consent: I have reviewed the patients History and Physical, chart, labs and discussed the procedure including the risks, benefits and alternatives for the proposed anesthesia with the patient or authorized representative who has indicated his/her understanding and acceptance.     Plan Discussed with:   Anesthesia Plan Comments:         Anesthesia Quick Evaluation

## 2015-08-28 NOTE — Op Note (Signed)
[facility] Patient Name: Jenna Woods Procedure Date: 08/28/2015 10:16 AM MRN: [id number] Date of Birth: [birth year] Attending MD: Hildred Laser , MD CSN: [id number] Age: 55 Admit Type: Outpatient Procedure:                Colonoscopy Indications:              Screening for colorectal malignant neoplasm Providers:                Hildred Laser, MD, Otis Peak B. Sharon Seller, RN, Randa Spike, Technician Referring MD:             Norwood Levo. Moshe Cipro, MD Medicines:                Propofol per Anesthesia Complications:            No immediate complications. Estimated Blood Loss:     Estimated blood loss: none. Procedure:                Pre-Anesthesia Assessment:                           - Prior to the procedure, a History and Physical                            was performed, and patient medications and                            allergies were reviewed. The patient's tolerance of                            previous anesthesia was also reviewed. The risks                            and benefits of the procedure and the sedation                            options and risks were discussed with the patient.                            All questions were answered, and informed consent                            was obtained. Prior Anticoagulants: The patient                            last took aspirin 3 days prior to the procedure.                            ASA Grade Assessment: III - A patient with severe                            systemic disease. After reviewing the risks and  benefits, the patient was deemed in satisfactory                            condition to undergo the procedure.                           After obtaining informed consent, the colonoscope                            was passed under direct vision. Throughout the                            procedure, the patient's blood pressure, pulse, and          oxygen saturations were monitored continuously. The                            [id number]) scope was introduced through                            the anus and advanced to the the cecum, identified                            by appendiceal orifice and ileocecal valve. The                            colonoscopy was performed without difficulty. The                            patient tolerated the procedure well. The quality                            of the bowel preparation was fair. The entire colon                            was examined. The ileocecal valve, appendiceal                            orifice, and rectum were photographed. Scope In: 10:48:53 AM Scope Out: 11:10:54 AM Scope Withdrawal Time: 0 hours 9 minutes 18 seconds  Total Procedure Duration: 0 hours 22 minutes 1 second  Findings:      Scattered medium-mouthed diverticula were found in the entire colon.      External hemorrhoids were found during retroflexion. The hemorrhoids       were small. Impression:               - Preparation of the colon was fair.                           - Diverticulosis in the entire examined colon.                           - External hemorrhoids.                           -  No specimens collected. Moderate Sedation:      Moderate (conscious) sedation was personally administered by an       anesthesia professional. The following parameters were monitored: oxygen       saturation, heart rate, blood pressure, CO2 capnography and response to       care. Total physician intraservice time was 30 minutes. Recommendation:           - Patient has a contact number available for                            emergencies. The signs and symptoms of potential                            delayed complications were discussed with the                            patient. Return to normal activities tomorrow.                            Written discharge instructions were provided to the                             patient.                           - High fiber diet today.                           - Continue present medications.                           - Resume aspirin at prior dose today.                           - Repeat colonoscopy in 5 years for screening                            purposes(since prep suboptimal). Procedure Code(s):        --- Professional ---                           [phone], Colonoscopy, flexible; diagnostic, including                            collection of specimen(s) by brushing or washing,                            when performed (separate procedure) Diagnosis Code(s):        --- Professional ---                           Z12.11, Encounter for screening for malignant                            neoplasm of colon  K64.4, Residual hemorrhoidal skin tags                           K57.30, Diverticulosis of large intestine without                            perforation or abscess without bleeding CPT copyright 2016 American Medical Association. All rights reserved. The codes documented in this report are preliminary and upon coder review may  be revised to meet current compliance requirements. Hildred Laser, MD Hildred Laser, MD 08/28/2015 11:19:49 AM This report has been signed electronically. Number of Addenda: 0

## 2015-08-28 NOTE — H&P (Signed)
Jenna Woods is an 55 y.o. female.   Chief Complaint: Patient is here for colonoscopy. HPI: Patient is 55 year old American female who is here for screening colonoscopy. Last exam was 12 years ago. She denies abdominal pain change in bowel habits or rectal bleeding. Family history is negative for CRC.  Past Medical History  Diagnosis Date  . Hypertension 1996  . Depression with anxiety   . GERD (gastroesophageal reflux disease)   . Scleroderma ([place]) 1998  . Obesity   . Systemic lupus erythematosus ([place]) 1998    Treated at [facility]  . Tobacco abuse, in remission     10-pack-years discontinued in 2007  . Anxiety   . Allergic reaction   . Urinary frequency   . Peripheral vascular disease ([place])   . Depression     h/o suicidal ideation in 2011  . Prediabetes 2013  . Allergy     perrenial   . Arthritis     Past Surgical History  Procedure Laterality Date  . Finger amputation      Third finger, bilateral, distal  . Abdominal hysterectomy  1990    Neoplasm  . Cesarean section      x2  . Colonoscopy  2005  . Nasal sinus surgery  09/06/2011    Procedure: ENDOSCOPIC SINUS SURGERY;  Surgeon: Ascencion Dike, MD;  Location: [place];  Service: ENT;  Laterality: N/A;  Nasal cerebrospinal fluid leak repair with Left temporalis fascia graft and Left Ear cartilage graft  . Vascular surgery      Hands, bilaterally, [facility]; right hand partial amputation of middle finger.    Family History  Problem Relation Age of Onset  . Arthritis Mother     Rheumatoid  . Dementia Mother   . Hypertension Mother   . Cirrhosis Father   . Alcohol abuse Father   . Arthritis Maternal Grandmother   . Drug abuse Sister    Social History:  reports that she has been smoking Cigarettes.  She has a 6.25 pack-year smoking history. She has never used smokeless tobacco. She reports that she does not drink alcohol or use illicit drugs.  Allergies:  Allergies  Allergen Reactions  .  Bee Venom Anaphylaxis  . Sesame Oil Anaphylaxis and Swelling    (Sesame seed)  . Molds & Smuts   . Codeine Itching and Rash    Medications Prior to Admission  Medication Sig Dispense Refill  . ALPRAZolam (XANAX) 1 MG tablet Take 1 tablet (1 mg total) by mouth 3 (three) times daily. 90 tablet 3  . amLODipine (NORVASC) 10 MG tablet TAKE ONE (1) TABLET EACH DAY 30 tablet 5  . amoxicillin (AMOXIL) 500 MG capsule Take 500 mg by mouth every 8 (eight) hours. Starting 08/12/2015 from dentist.    . aspirin EC 81 MG tablet Take 81 mg by mouth daily.    . Calcium Carbonate-Vit D-Min (CALCIUM 1200) 1200-1000 MG-UNIT CHEW Chew 1 tablet by mouth daily.    Marland Kitchen ibuprofen (ADVIL,MOTRIN) 800 MG tablet Take 800 mg by mouth every 8 (eight) hours as needed for moderate pain.    . metFORMIN (GLUCOPHAGE) 500 MG tablet Take 1 tablet (500 mg total) by mouth 2 (two) times daily with a meal. 90 tablet 5  . NEXIUM 40 MG capsule TAKE ONE CAPSULE DAILY BEFORE BREAKFAST 30 capsule 1  . polyethylene glycol-electrolytes (NULYTELY/GOLYTELY) 420 g solution Take 4,000 mLs by mouth once. 4000 mL 0  . TOVIAZ 8 MG TB24 tablet Take 8  mg by mouth daily.    . traZODone (DESYREL) 100 MG tablet Take 1 tablet (100 mg total) by mouth at bedtime. (Patient taking differently: Take 100 mg by mouth at bedtime as needed for sleep. ) 30 tablet 3    Results for orders placed or performed during the hospital encounter of 08/28/15 (from the past 48 hour(s))  Glucose, capillary     Status: None   Collection Time: 08/28/15  9:56 AM  Result Value Ref Range   Glucose-Capillary 94 65 - 99 mg/dL   No results found.  ROS  Blood pressure 108/69, temperature 97.7 F (36.5 C), temperature source Oral, resp. rate 18, SpO2 95 %. Physical Exam  Constitutional: She appears well-developed and well-nourished.  HENT:  Mouth/Throat: Oropharynx is clear and moist.  Eyes: Conjunctivae are normal. No scleral icterus.  Neck: No thyromegaly present.   Cardiovascular: Normal rate, regular rhythm and normal heart sounds.   No murmur heard. Respiratory: Effort normal and breath sounds normal.  GI:  Abdomen is full with lower midline scar. It is soft and nontender without organomegaly or masses.  Musculoskeletal: She exhibits no edema.  Lymphadenopathy:    She has no cervical adenopathy.  Neurological: She is alert.  Skin: Skin is warm and dry.     Assessment/Plan Average risk screening colonoscopy.  Hildred Laser, MD 08/28/2015, 10:29 AM

## 2015-09-01 ENCOUNTER — Encounter (HOSPITAL_COMMUNITY): Payer: Self-pay | Admitting: Internal Medicine

## 2015-09-01 ENCOUNTER — Other Ambulatory Visit: Payer: Self-pay | Admitting: Family Medicine

## 2015-09-09 ENCOUNTER — Telehealth: Payer: Self-pay | Admitting: Family Medicine

## 2015-09-09 MED ORDER — FLUCONAZOLE 150 MG PO TABS: ORAL_TABLET | ORAL | Status: DC

## 2015-09-09 NOTE — Telephone Encounter (Signed)
Do you agree?

## 2015-09-09 NOTE — Telephone Encounter (Signed)
Med sent pls let her know 

## 2015-09-09 NOTE — Telephone Encounter (Signed)
Patient aware.

## 2015-09-09 NOTE — Telephone Encounter (Signed)
Patient has had an abscessed tooth and has been put on antibiotic and now she has a yeast infection, she is asking for some Diflucan 2 day treatment, called to [facility]

## 2015-11-02 ENCOUNTER — Ambulatory Visit (INDEPENDENT_AMBULATORY_CARE_PROVIDER_SITE_OTHER): Payer: Medicare Other | Admitting: Psychiatry

## 2015-11-02 ENCOUNTER — Encounter (HOSPITAL_COMMUNITY): Payer: Self-pay | Admitting: Psychiatry

## 2015-11-02 VITALS — BP 115/86 | HR 89 | Ht 67.0 in | Wt 231.2 lb

## 2015-11-02 DIAGNOSIS — F331 Major depressive disorder, recurrent, moderate: Secondary | ICD-10-CM

## 2015-11-02 MED ORDER — ALPRAZOLAM 1 MG PO TABS: 1.0000 mg | ORAL_TABLET | Freq: Three times a day (TID) | ORAL | 3 refills | Status: DC

## 2015-11-02 MED ORDER — TRAZODONE HCL 100 MG PO TABS: 100.0000 mg | ORAL_TABLET | Freq: Every evening | ORAL | 3 refills | Status: DC | PRN
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
## 2015-11-19 ENCOUNTER — Other Ambulatory Visit: Payer: Self-pay

## 2015-11-19 MED ORDER — FESOTERODINE FUMARATE ER 8 MG PO TB24: 8.0000 mg | ORAL_TABLET | Freq: Every day | ORAL | 1 refills | Status: DC

## 2015-11-26 ENCOUNTER — Emergency Department (HOSPITAL_COMMUNITY)
Admission: EM | Admit: 2015-11-26 | Discharge: 2015-11-26 | Disposition: A | Discharge status: Home / Self Care | Payer: Medicare Other | Attending: Emergency Medicine | Admitting: Emergency Medicine

## 2015-11-26 ENCOUNTER — Encounter (HOSPITAL_COMMUNITY): Payer: Self-pay | Admitting: Cardiology

## 2015-11-26 DIAGNOSIS — Z7984 Long term (current) use of oral hypoglycemic drugs: Secondary | ICD-10-CM | POA: Diagnosis not present

## 2015-11-26 DIAGNOSIS — I10 Essential (primary) hypertension: Secondary | ICD-10-CM | POA: Diagnosis not present

## 2015-11-26 DIAGNOSIS — Z87891 Personal history of nicotine dependence: Secondary | ICD-10-CM | POA: Diagnosis not present

## 2015-11-26 DIAGNOSIS — Z79899 Other long term (current) drug therapy: Secondary | ICD-10-CM | POA: Insufficient documentation

## 2015-11-26 DIAGNOSIS — Z7982 Long term (current) use of aspirin: Secondary | ICD-10-CM | POA: Diagnosis not present

## 2015-11-26 DIAGNOSIS — L509 Urticaria, unspecified: Secondary | ICD-10-CM

## 2015-11-26 MED ORDER — FAMOTIDINE 20 MG PO TABS
10.0000 mg | ORAL_TABLET | Freq: Once | ORAL | Status: AC
  Administered 2015-11-26: 10 mg via ORAL
  Filled 2015-11-26: qty 1

## 2015-11-26 MED ORDER — DEXAMETHASONE SODIUM PHOSPHATE 10 MG/ML IJ SOLN
15.0000 mg | Freq: Once | INTRAMUSCULAR | Status: AC
  Administered 2015-11-26: 15 mg via INTRAMUSCULAR
  Filled 2015-11-26: qty 2

## 2015-11-26 MED ORDER — PREDNISONE 20 MG PO TABS: ORAL_TABLET | ORAL | 0 refills | Status: DC

## 2015-11-26 MED ORDER — HYDROXYZINE HCL 25 MG PO TABS: 25.0000 mg | ORAL_TABLET | Freq: Three times a day (TID) | ORAL | 0 refills | Status: DC | PRN

## 2015-11-26 MED ORDER — FAMOTIDINE 20 MG PO TABS: 20.0000 mg | ORAL_TABLET | Freq: Two times a day (BID) | ORAL | 0 refills | Status: DC

## 2015-11-26 NOTE — ED Provider Notes (Signed)
[facility] DEPT Provider Note   CSN: [id number] Arrival date & time: 11/26/15  [id number]     History   Chief Complaint Chief Complaint  Patient presents with  . Insect Bite    HPI Jenna Woods is a 55 y.o. female.  Patient stated motel while on a business trip and subsequently had a big Mac at [employer] on the bottom. She also stated that she found to be in her close and is unsure if she was bitten by it. Since that time she's had hives that have come up everywhere. No shows of breath, nausea, vomiting, syncope, abdominal pain, throat swelling or difficulty swallowing. He has tried Benadryl at home with minimal relief. Similar to multiple previous episodes of allergic reaction. Not consistent with her lupus or scleroderma rashes. No other exacerbating or relieving factors.      Past Medical History:  Diagnosis Date  . Allergic reaction   . Allergy    perrenial   . Anxiety   . Arthritis   . Depression    h/o suicidal ideation in 2011  . Depression with anxiety   . GERD (gastroesophageal reflux disease)   . Hypertension 1996  . Obesity   . Peripheral vascular disease ([place])   . Prediabetes 2013  . Scleroderma ([place]) 1998  . Systemic lupus erythematosus ([place]) 1998   Treated at [facility]  . Tobacco abuse, in remission    10-pack-years discontinued in 2007  . Urinary frequency     Patient Active Problem List   Diagnosis Date Noted  . Medicare annual wellness visit, subsequent 03/28/2015  . Insomnia due to mental disorder 09/15/2014  . Major depression ([place]) 08/29/2014  . Hot flashes, menopausal 08/18/2013  . Osteoarthritis of left knee 08/05/2013  . Depression with anxiety 04/02/2013  . Finger injury 11/29/2012  . IGT (impaired glucose tolerance) 06/05/2012  . Vitamin D deficiency 09/23/2011  . Urinary incontinence 10/26/2010  . GERD (gastroesophageal reflux disease)   . Scleroderma ([place])   . Systemic lupus erythematosus ([place])   . Fatigue 08/20/2009  .  Morbid obesity ([place]) 04/02/2009  . Hypertension 04/02/2009    Past Surgical History:  Procedure Laterality Date  . ABDOMINAL HYSTERECTOMY  1990   Neoplasm  . CESAREAN SECTION     x2  . COLONOSCOPY  2005  . COLONOSCOPY WITH PROPOFOL N/A 08/28/2015   Procedure: COLONOSCOPY WITH PROPOFOL;  Surgeon: Rogene Houston, MD;  Location: [facility];  Service: Endoscopy;  Laterality: N/A;  10:30  . FINGER AMPUTATION     Third finger, bilateral, distal  . NASAL SINUS SURGERY  09/06/2011   Procedure: ENDOSCOPIC SINUS SURGERY;  Surgeon: Ascencion Dike, MD;  Location: [facility];  Service: ENT;  Laterality: N/A;  Nasal cerebrospinal fluid leak repair with Left temporalis fascia graft and Left Ear cartilage graft  . VASCULAR SURGERY     Hands, bilaterally, [facility]; right hand partial amputation of middle finger.    OB History    Gravida Para Term Preterm AB Living   4 2     2 2    SAB TAB Ectopic Multiple Live Births   2               Home Medications    Prior to Admission medications   Medication Sig Start Date End Date Taking? Authorizing Provider  ALPRAZolam Duanne Moron) 1 MG tablet Take 1 tablet (1 mg total) by mouth 3 (three) times daily. 11/02/15   Cloria Spring, MD  amLODipine (NORVASC) 10 MG tablet TAKE ONE (1) TABLET EACH DAY 07/14/15   Fayrene Helper, MD  aspirin EC 81 MG tablet Take 81 mg by mouth daily.    Historical Provider, MD  Calcium Carbonate-Vit D-Min (CALCIUM 1200) 1200-1000 MG-UNIT CHEW Chew 1 tablet by mouth daily.    Historical Provider, MD  famotidine (PEPCID) 20 MG tablet Take 1 tablet (20 mg total) by mouth 2 (two) times daily. 11/26/15   Merrily Pew, MD  fesoterodine (TOVIAZ) 8 MG TB24 tablet Take 1 tablet (8 mg total) by mouth daily. 11/19/15   Fayrene Helper, MD  hydrOXYzine (ATARAX/VISTARIL) 25 MG tablet Take 1 tablet (25 mg total) by mouth every 8 (eight) hours as needed. 11/26/15   Merrily Pew, MD  metFORMIN (GLUCOPHAGE) 500 MG tablet Take 1  tablet (500 mg total) by mouth 2 (two) times daily with a meal. 07/14/15   Fayrene Helper, MD  NEXIUM 40 MG capsule TAKE ONE (1) CAPSULE EACH DAY BEFORE BREAKFAST 09/01/15   Fayrene Helper, MD  predniSONE (DELTASONE) 20 MG tablet 3 tabs po daily x 3 days, then 2 tabs x 3 days, then 1.5 tabs x 3 days, then 1 tab x 3 days, then 0.5 tabs x 3 days 11/26/15   Merrily Pew, MD  traZODone (DESYREL) 100 MG tablet Take 1 tablet (100 mg total) by mouth at bedtime as needed for sleep. 11/02/15   Cloria Spring, MD    Family History Family History  Problem Relation Age of Onset  . Arthritis Mother     Rheumatoid  . Dementia Mother   . Hypertension Mother   . Cirrhosis Father   . Alcohol abuse Father   . Arthritis Maternal Grandmother   . Drug abuse Sister     Social History Social History  Substance Use Topics  . Smoking status: Former Smoker    Packs/day: 0.25    Years: 25.00    Types: Cigarettes  . Smokeless tobacco: Never Used  . Alcohol use No     Comment: quit in 2004     Allergies   Bee venom; Sesame oil; Molds & smuts; and Codeine   Review of Systems Review of Systems  Skin: Positive for rash.  All other systems reviewed and are negative.    Physical Exam Updated Vital Signs BP 120/87 (BP Location: Left Arm)   Pulse 92   Temp 98.7 F (37.1 C) (Oral)   Resp 16   Ht 5\' 3"  (1.6 m)   Wt 210 lb (95.3 kg)   SpO2 97%   BMI 37.20 kg/m   Physical Exam  Constitutional: She appears well-developed and well-nourished. No distress.  HENT:  Head: Normocephalic and atraumatic.  Eyes: Conjunctivae are normal.  Neck: Neck supple.  Cardiovascular: Normal rate and regular rhythm.   No murmur heard. Pulmonary/Chest: Effort normal and breath sounds normal. No respiratory distress. She has no wheezes. She has no rales.  Abdominal: Soft. She exhibits no distension. There is no tenderness. There is no guarding.  Musculoskeletal: She exhibits no edema.  Neurological: She is  alert.  Skin: Skin is warm and dry. Rash (hives on back, forearms, legs) noted.  Psychiatric: She has a normal mood and affect.  Nursing note and vitals reviewed.    ED Treatments / Results  Labs (all labs ordered are listed, but only abnormal results are displayed) Labs Reviewed - No data to display  EKG  EKG Interpretation None       Radiology  No results found.  Procedures Procedures (including critical care time)  Medications Ordered in ED Medications  dexamethasone (DECADRON) injection 15 mg (15 mg Intramuscular Given 11/26/15 1856)  famotidine (PEPCID) tablet 10 mg (10 mg Oral Given 11/26/15 1000)     Initial Impression / Assessment and Plan / ED Course  I have reviewed the triage vital signs and the nursing notes.  Pertinent labs & imaging results that were available during my care of the patient were reviewed by me and considered in my medical decision making (see chart for details).  Clinical Course    Likely allergic reaction secondary to bee sting or Sesame seeds for which she has an allergy to. His similar to previous allergic reactions. No evidence of anaphylaxis. Plan for steroid taper, antihistamines and PCP follow-up.  Final Clinical Impressions(s) / ED Diagnoses   Final diagnoses:  Hives    New Prescriptions Discharge Medication List as of 11/26/2015  6:17 PM    START taking these medications   Details  famotidine (PEPCID) 20 MG tablet Take 1 tablet (20 mg total) by mouth 2 (two) times daily., Starting Thu 11/26/2015, Print    hydrOXYzine (ATARAX/VISTARIL) 25 MG tablet Take 1 tablet (25 mg total) by mouth every 8 (eight) hours as needed., Starting Thu 11/26/2015, Print    predniSONE (DELTASONE) 20 MG tablet 3 tabs po daily x 3 days, then 2 tabs x 3 days, then 1.5 tabs x 3 days, then 1 tab x 3 days, then 0.5 tabs x 3 days, Print         Merrily Pew, MD 11/26/15 2028

## 2015-11-26 NOTE — ED Triage Notes (Addendum)
Stung by bees several times all over  Tuesday.  C/o itching and rash

## 2015-12-01 DIAGNOSIS — I10 Essential (primary) hypertension: Secondary | ICD-10-CM | POA: Diagnosis not present

## 2015-12-01 DIAGNOSIS — E559 Vitamin D deficiency, unspecified: Secondary | ICD-10-CM | POA: Diagnosis not present

## 2015-12-01 DIAGNOSIS — R7302 Impaired glucose tolerance (oral): Secondary | ICD-10-CM | POA: Diagnosis not present

## 2015-12-02 LAB — BASIC METABOLIC PANEL WITH GFR
BUN: 17 mg/dL (ref 7–25)
CALCIUM: 10 mg/dL (ref 8.6–10.4)
CO2: 24 mmol/L (ref 20–31)
Chloride: 106 mmol/L (ref 98–110)
Creat: 0.83 mg/dL (ref 0.50–1.05)
GFR, EST NON AFRICAN AMERICAN: 79 mL/min (ref >=60)
GLUCOSE: 120 mg/dL — AB (ref 65–99)
POTASSIUM: 4.8 mmol/L (ref 3.5–5.3)
SODIUM: 140 mmol/L (ref 135–146)

## 2015-12-02 LAB — VITAMIN D 25 HYDROXY (VIT D DEFICIENCY, FRACTURES): VIT D 25 HYDROXY: 44 ng/mL (ref 30–100)

## 2015-12-02 LAB — HEMOGLOBIN A1C
Hgb A1c MFr Bld: 5.7 % — ABNORMAL HIGH (ref <5.7)
Mean Plasma Glucose: 117 mg/dL

## 2015-12-10 ENCOUNTER — Ambulatory Visit: Payer: Self-pay | Admitting: Family Medicine

## 2015-12-15 ENCOUNTER — Other Ambulatory Visit: Payer: Self-pay | Admitting: Family Medicine

## 2015-12-15 ENCOUNTER — Encounter: Payer: Self-pay | Admitting: Family Medicine

## 2015-12-15 ENCOUNTER — Ambulatory Visit (INDEPENDENT_AMBULATORY_CARE_PROVIDER_SITE_OTHER): Payer: Medicaid Other | Admitting: Family Medicine

## 2015-12-15 VITALS — BP 124/82 | HR 100 | Ht 63.0 in | Wt 230.0 lb

## 2015-12-15 DIAGNOSIS — F418 Other specified anxiety disorders: Secondary | ICD-10-CM

## 2015-12-15 DIAGNOSIS — Z13220 Encounter for screening for lipoid disorders: Secondary | ICD-10-CM

## 2015-12-15 DIAGNOSIS — Z23 Encounter for immunization: Secondary | ICD-10-CM

## 2015-12-15 DIAGNOSIS — E559 Vitamin D deficiency, unspecified: Secondary | ICD-10-CM | POA: Diagnosis not present

## 2015-12-15 DIAGNOSIS — F5105 Insomnia due to other mental disorder: Secondary | ICD-10-CM

## 2015-12-15 DIAGNOSIS — N39498 Other specified urinary incontinence: Secondary | ICD-10-CM

## 2015-12-15 DIAGNOSIS — I10 Essential (primary) hypertension: Secondary | ICD-10-CM | POA: Diagnosis not present

## 2015-12-15 DIAGNOSIS — R7302 Impaired glucose tolerance (oral): Secondary | ICD-10-CM | POA: Diagnosis not present

## 2015-12-15 DIAGNOSIS — Z1231 Encounter for screening mammogram for malignant neoplasm of breast: Secondary | ICD-10-CM

## 2015-12-15 MED ORDER — MONTELUKAST SODIUM 10 MG PO TABS: 10.0000 mg | ORAL_TABLET | Freq: Every day | ORAL | 1 refills | Status: DC

## 2015-12-15 MED ORDER — METFORMIN HCL 500 MG PO TABS: 500.0000 mg | ORAL_TABLET | Freq: Every day | ORAL | 1 refills | Status: DC

## 2015-12-15 NOTE — Patient Instructions (Addendum)
[place] in 4 month, call if you need me before  CONGRATS on weight loss and improved blood work, keep it up  REDUCE metformin to ONE daily  Flu vaccine today  CBC, fasting lipid, cmp HBA1C and TSH in 4 months     DASH Eating Plan DASH stands for "Dietary Approaches to Stop Hypertension." The DASH eating plan is a healthy eating plan that has been shown to reduce high blood pressure (hypertension). Additional health benefits may include reducing the risk of type 2 diabetes mellitus, heart disease, and stroke. The DASH eating plan may also help with weight loss. WHAT DO I NEED TO KNOW ABOUT THE DASH EATING PLAN? For the DASH eating plan, you will follow these general guidelines:  Choose foods with a percent daily value for sodium of less than 5% (as listed on the food label).  Use salt-free seasonings or herbs instead of table salt or sea salt.  Check with your health care provider or pharmacist before using salt substitutes.  Eat lower-sodium products, often labeled as "lower sodium" or "no salt added."  Eat fresh foods.  Eat more vegetables, fruits, and low-fat dairy products.  Choose whole grains. Look for the word "whole" as the first word in the ingredient list.  Choose fish and skinless chicken or [place] more often than red meat. Limit fish, poultry, and meat to 6 oz (170 g) each day.  Limit sweets, desserts, sugars, and sugary drinks.  Choose heart-healthy fats.  Limit cheese to 1 oz (28 g) per day.  Eat more home-cooked food and less restaurant, buffet, and fast food.  Limit fried foods.  Cook foods using methods other than frying.  Limit canned vegetables. If you do use them, rinse them well to decrease the sodium.  When eating at a restaurant, ask that your food be prepared with less salt, or no salt if possible. WHAT FOODS CAN I EAT? Seek help from a dietitian for individual calorie needs. Grains Whole grain or whole wheat bread. Brown rice. Whole grain or  whole wheat pasta. Quinoa, bulgur, and whole grain cereals. Low-sodium cereals. Corn or whole wheat flour tortillas. Whole grain cornbread. Whole grain crackers. Low-sodium crackers. Vegetables Fresh or frozen vegetables (raw, steamed, roasted, or grilled). Low-sodium or reduced-sodium tomato and vegetable juices. Low-sodium or reduced-sodium tomato sauce and paste. Low-sodium or reduced-sodium canned vegetables.  Fruits All fresh, canned (in natural juice), or frozen fruits. Meat and Other Protein Products Ground beef (85% or leaner), grass-fed beef, or beef trimmed of fat. Skinless chicken or [place]. Ground chicken or [place]. Pork trimmed of fat. All fish and seafood. Eggs. Dried beans, peas, or lentils. Unsalted nuts and seeds. Unsalted canned beans. Dairy Low-fat dairy products, such as skim or 1% milk, 2% or reduced-fat cheeses, low-fat ricotta or cottage cheese, or plain low-fat yogurt. Low-sodium or reduced-sodium cheeses. Fats and Oils Tub margarines without trans fats. Light or reduced-fat mayonnaise and salad dressings (reduced sodium). Avocado. Safflower, olive, or canola oils. Natural peanut or almond butter. Other Unsalted popcorn and pretzels. The items listed above may not be a complete list of recommended foods or beverages. Contact your dietitian for more options. WHAT FOODS ARE NOT RECOMMENDED? Grains White bread. White pasta. White rice. Refined cornbread. Bagels and croissants. Crackers that contain trans fat. Vegetables Creamed or fried vegetables. Vegetables in a cheese sauce. Regular canned vegetables. Regular canned tomato sauce and paste. Regular tomato and vegetable juices. Fruits Dried fruits. Canned fruit in light or heavy syrup. Fruit juice. Meat  and Other Protein Products Fatty cuts of meat. Ribs, chicken wings, bacon, sausage, bologna, salami, chitterlings, fatback, hot dogs, bratwurst, and packaged luncheon meats. Salted nuts and seeds. Canned beans with  salt. Dairy Whole or 2% milk, cream, half-and-half, and cream cheese. Whole-fat or sweetened yogurt. Full-fat cheeses or blue cheese. Nondairy creamers and whipped toppings. Processed cheese, cheese spreads, or cheese curds. Condiments Onion and garlic salt, seasoned salt, table salt, and sea salt. Canned and packaged gravies. Worcestershire sauce. Tartar sauce. Barbecue sauce. Teriyaki sauce. Soy sauce, including reduced sodium. Steak sauce. Fish sauce. Oyster sauce. Cocktail sauce. Horseradish. Ketchup and mustard. Meat flavorings and tenderizers. Bouillon cubes. Hot sauce. Tabasco sauce. Marinades. Taco seasonings. Relishes. Fats and Oils Butter, stick margarine, lard, shortening, ghee, and bacon fat. Coconut, palm kernel, or palm oils. Regular salad dressings. Other Pickles and olives. Salted popcorn and pretzels. The items listed above may not be a complete list of foods and beverages to avoid. Contact your dietitian for more information. WHERE CAN I FIND MORE INFORMATION? National Heart, Lung, and Blood Institute: travelstabloid.com   This information is not intended to replace advice given to you by your health care provider. Make sure you discuss any questions you have with your health care provider.   Document Released: 02/10/2011 Document Revised: 03/14/2014 Document Reviewed: 12/26/2012 Elsevier Interactive Patient Education [employer].

## 2015-12-19 ENCOUNTER — Encounter (HOSPITAL_COMMUNITY): Payer: Self-pay | Admitting: *Deleted

## 2015-12-19 ENCOUNTER — Inpatient Hospital Stay (HOSPITAL_COMMUNITY)
Admission: EM | Admit: 2015-12-19 | Discharge: 2015-12-23 | DRG: 378 | Disposition: A | Discharge status: Home / Self Care | Payer: Medicare Other | Attending: Internal Medicine | Admitting: Internal Medicine

## 2015-12-19 ENCOUNTER — Encounter: Payer: Self-pay | Admitting: Family Medicine

## 2015-12-19 DIAGNOSIS — M349 Systemic sclerosis, unspecified: Secondary | ICD-10-CM | POA: Diagnosis present

## 2015-12-19 DIAGNOSIS — I10 Essential (primary) hypertension: Secondary | ICD-10-CM | POA: Diagnosis present

## 2015-12-19 DIAGNOSIS — Z87891 Personal history of nicotine dependence: Secondary | ICD-10-CM

## 2015-12-19 DIAGNOSIS — K922 Gastrointestinal hemorrhage, unspecified: Secondary | ICD-10-CM

## 2015-12-19 DIAGNOSIS — Z8261 Family history of arthritis: Secondary | ICD-10-CM | POA: Diagnosis not present

## 2015-12-19 DIAGNOSIS — E861 Hypovolemia: Secondary | ICD-10-CM | POA: Diagnosis present

## 2015-12-19 DIAGNOSIS — Z7982 Long term (current) use of aspirin: Secondary | ICD-10-CM

## 2015-12-19 DIAGNOSIS — K5730 Diverticulosis of large intestine without perforation or abscess without bleeding: Secondary | ICD-10-CM

## 2015-12-19 DIAGNOSIS — Z91030 Bee allergy status: Secondary | ICD-10-CM | POA: Diagnosis not present

## 2015-12-19 DIAGNOSIS — K5731 Diverticulosis of large intestine without perforation or abscess with bleeding: Secondary | ICD-10-CM | POA: Diagnosis not present

## 2015-12-19 DIAGNOSIS — Z885 Allergy status to narcotic agent status: Secondary | ICD-10-CM | POA: Diagnosis not present

## 2015-12-19 DIAGNOSIS — M329 Systemic lupus erythematosus, unspecified: Secondary | ICD-10-CM | POA: Diagnosis present

## 2015-12-19 DIAGNOSIS — Z6837 Body mass index (BMI) 37.0-37.9, adult: Secondary | ICD-10-CM

## 2015-12-19 DIAGNOSIS — Z8249 Family history of ischemic heart disease and other diseases of the circulatory system: Secondary | ICD-10-CM | POA: Diagnosis not present

## 2015-12-19 DIAGNOSIS — F418 Other specified anxiety disorders: Secondary | ICD-10-CM | POA: Diagnosis present

## 2015-12-19 DIAGNOSIS — Z91018 Allergy to other foods: Secondary | ICD-10-CM | POA: Diagnosis not present

## 2015-12-19 DIAGNOSIS — Z7984 Long term (current) use of oral hypoglycemic drugs: Secondary | ICD-10-CM | POA: Diagnosis not present

## 2015-12-19 DIAGNOSIS — K219 Gastro-esophageal reflux disease without esophagitis: Secondary | ICD-10-CM | POA: Diagnosis present

## 2015-12-19 DIAGNOSIS — I959 Hypotension, unspecified: Secondary | ICD-10-CM | POA: Diagnosis present

## 2015-12-19 DIAGNOSIS — D62 Acute posthemorrhagic anemia: Secondary | ICD-10-CM | POA: Diagnosis present

## 2015-12-19 DIAGNOSIS — K625 Hemorrhage of anus and rectum: Secondary | ICD-10-CM | POA: Diagnosis not present

## 2015-12-19 DIAGNOSIS — Z811 Family history of alcohol abuse and dependence: Secondary | ICD-10-CM

## 2015-12-19 DIAGNOSIS — R7302 Impaired glucose tolerance (oral): Secondary | ICD-10-CM | POA: Diagnosis present

## 2015-12-19 HISTORY — DX: Diverticulosis of large intestine without perforation or abscess without bleeding: K57.30

## 2015-12-19 HISTORY — DX: Diverticulosis of large intestine without perforation or abscess with bleeding: K57.31

## 2015-12-19 LAB — COMPREHENSIVE METABOLIC PANEL
ALT: 15 U/L (ref 14–54)
AST: 14 U/L — AB (ref 15–41)
Albumin: 3.8 g/dL (ref 3.5–5.0)
Alkaline Phosphatase: 69 U/L (ref 38–126)
Anion gap: 7 (ref 5–15)
BILIRUBIN TOTAL: 0.5 mg/dL (ref 0.3–1.2)
BUN: 13 mg/dL (ref 6–20)
CO2: 26 mmol/L (ref 22–32)
CREATININE: 0.79 mg/dL (ref 0.44–1.00)
Calcium: 9.1 mg/dL (ref 8.9–10.3)
Chloride: 104 mmol/L (ref 101–111)
GFR calc Af Amer: >60 mL/min (ref >60)
Glucose, Bld: 102 mg/dL — ABNORMAL HIGH (ref 65–99)
POTASSIUM: 3.5 mmol/L (ref 3.5–5.1)
Sodium: 137 mmol/L (ref 135–145)
TOTAL PROTEIN: 6.9 g/dL (ref 6.5–8.1)

## 2015-12-19 LAB — PROTIME-INR
INR: 0.96
Prothrombin Time: 12.8 seconds (ref 11.4–15.2)

## 2015-12-19 LAB — POC OCCULT BLOOD, ED: FECAL OCCULT BLD: POSITIVE — AB

## 2015-12-19 LAB — CBC WITH DIFFERENTIAL/PLATELET
BASOS ABS: 0 10*3/uL (ref 0.0–0.1)
Basophils Relative: 0 %
Eosinophils Absolute: 0.3 10*3/uL (ref 0.0–0.7)
Eosinophils Relative: 3 %
HEMATOCRIT: 36.9 % (ref 36.0–46.0)
Hemoglobin: 12.4 g/dL (ref 12.0–15.0)
LYMPHS ABS: 2 10*3/uL (ref 0.7–4.0)
LYMPHS PCT: 18 %
MCH: 30.4 pg (ref 26.0–34.0)
MCHC: 33.6 g/dL (ref 30.0–36.0)
MCV: 90.4 fL (ref 78.0–100.0)
MONO ABS: 0.6 10*3/uL (ref 0.1–1.0)
MONOS PCT: 5 %
NEUTROS ABS: 8 10*3/uL — AB (ref 1.7–7.7)
Neutrophils Relative %: 74 %
Platelets: 303 10*3/uL (ref 150–400)
RBC: 4.08 MIL/uL (ref 3.87–5.11)
RDW: 13.5 % (ref 11.5–15.5)
WBC: 10.9 10*3/uL — ABNORMAL HIGH (ref 4.0–10.5)

## 2015-12-19 LAB — ABO/RH: ABO/RH(D): AB POS

## 2015-12-19 LAB — HEMOGLOBIN AND HEMATOCRIT, BLOOD
HCT: 30.7 % — ABNORMAL LOW (ref 36.0–46.0)
HEMOGLOBIN: 10.1 g/dL — AB (ref 12.0–15.0)

## 2015-12-19 LAB — APTT: APTT: 33 s (ref 24–36)

## 2015-12-19 LAB — PREPARE RBC (CROSSMATCH)

## 2015-12-19 LAB — TSH: TSH: 0.444 u[IU]/mL (ref 0.350–4.500)

## 2015-12-19 LAB — MRSA PCR SCREENING: MRSA by PCR: POSITIVE — AB

## 2015-12-19 MED ORDER — SODIUM CHLORIDE 0.9 % IV BOLUS (SEPSIS)
1000.0000 mL | Freq: Once | INTRAVENOUS | Status: AC
  Administered 2015-12-19: 1000 mL via INTRAVENOUS

## 2015-12-19 MED ORDER — ONDANSETRON HCL 4 MG/2ML IJ SOLN: 4.0000 mg | Freq: Four times a day (QID) | INTRAMUSCULAR | Status: DC | PRN

## 2015-12-19 MED ORDER — ALPRAZOLAM 1 MG PO TABS
1.0000 mg | ORAL_TABLET | Freq: Three times a day (TID) | ORAL | Status: DC
  Administered 2015-12-20 – 2015-12-23 (×10): 1 mg via ORAL
  Filled 2015-12-19 (×4): qty 1
  Filled 2015-12-19: qty 2
  Filled 2015-12-19 (×5): qty 1

## 2015-12-19 MED ORDER — ALBUTEROL SULFATE (2.5 MG/3ML) 0.083% IN NEBU: 2.5000 mg | INHALATION_SOLUTION | RESPIRATORY_TRACT | Status: DC | PRN

## 2015-12-19 MED ORDER — POTASSIUM CHLORIDE IN NACL 20-0.9 MEQ/L-% IV SOLN
INTRAVENOUS | Status: DC
  Administered 2015-12-19 – 2015-12-22 (×8): via INTRAVENOUS

## 2015-12-19 MED ORDER — MONTELUKAST SODIUM 10 MG PO TABS
10.0000 mg | ORAL_TABLET | Freq: Every day | ORAL | Status: DC
  Administered 2015-12-19 – 2015-12-22 (×4): 10 mg via ORAL
  Filled 2015-12-19 (×4): qty 1

## 2015-12-19 MED ORDER — SODIUM CHLORIDE 0.9 % IV BOLUS (SEPSIS): 1000.0000 mL | Freq: Once | INTRAVENOUS | Status: DC

## 2015-12-19 MED ORDER — PANTOPRAZOLE SODIUM 40 MG PO TBEC
80.0000 mg | DELAYED_RELEASE_TABLET | Freq: Every day | ORAL | Status: DC
  Administered 2015-12-20 – 2015-12-22 (×3): 80 mg via ORAL
  Filled 2015-12-19 (×3): qty 2

## 2015-12-19 MED ORDER — SODIUM CHLORIDE 0.9 % IV SOLN: Freq: Once | INTRAVENOUS | Status: DC

## 2015-12-19 MED ORDER — ACETAMINOPHEN 325 MG PO TABS: 650.0000 mg | ORAL_TABLET | Freq: Four times a day (QID) | ORAL | Status: DC | PRN

## 2015-12-19 MED ORDER — POTASSIUM CHLORIDE CRYS ER 20 MEQ PO TBCR: 30.0000 meq | EXTENDED_RELEASE_TABLET | Freq: Every day | ORAL | Status: AC

## 2015-12-19 MED ORDER — ALPRAZOLAM 0.5 MG PO TABS
0.5000 mg | ORAL_TABLET | Freq: Once | ORAL | Status: AC
  Administered 2015-12-19: 0.5 mg via ORAL
  Filled 2015-12-19: qty 1

## 2015-12-19 MED ORDER — ACETAMINOPHEN 650 MG RE SUPP: 650.0000 mg | Freq: Four times a day (QID) | RECTAL | Status: DC | PRN

## 2015-12-19 MED ORDER — TRAZODONE HCL 50 MG PO TABS
100.0000 mg | ORAL_TABLET | Freq: Every evening | ORAL | Status: DC | PRN
  Administered 2015-12-20 – 2015-12-21 (×3): 100 mg via ORAL
  Filled 2015-12-19 (×3): qty 2

## 2015-12-19 MED ORDER — ONDANSETRON HCL 4 MG PO TABS: 4.0000 mg | ORAL_TABLET | Freq: Four times a day (QID) | ORAL | Status: DC | PRN

## 2015-12-19 NOTE — Assessment & Plan Note (Signed)
Controlled, no change in medication  

## 2015-12-19 NOTE — Assessment & Plan Note (Signed)
Patient educated about the importance of limiting  Carbohydrate intake , the need to commit to daily physical activity for a minimum of 30 minutes , and to commit weight loss. The fact that changes in all these areas will reduce or eliminate all together the development of diabetes is stressed.  Markedly improved  Diabetic Labs Latest Ref Rng & Units 12/01/2015 08/25/2015 04/02/2015 09/15/2014 03/03/2014  HbA1c <5.7 % 5.7(H) - 6.4(H) 6.1(H) 6.2(H)  Chol 125 - 200 mg/dL - - 109(L) - 116  HDL >=46 mg/dL - - 40(L) - 45  Calc LDL <130 mg/dL - - 45 - 56  Triglycerides <150 mg/dL - - 118 - 75  Creatinine 0.50 - 1.05 mg/dL 0.83 0.65 0.87 0.71 0.71   BP/Weight 12/15/2015 11/26/2015 08/28/2015 08/25/2015 07/14/2015 03/27/2015 99991111  Systolic BP A999333 123456 123456 123456 123456 [id number]  Diastolic BP 82 87 87 84 68 82 95  Wt. (Lbs) 230 210 - 235 240 245 186  BMI 40.74 37.2 - 36.8 38.76 39.56 29.12  Some encounter information is confidential and restricted. Go to Review Flowsheets activity to see all data.   No flowsheet data found.

## 2015-12-19 NOTE — Progress Notes (Signed)
Spoke to patient's nurse, Josph Macho. Patient has gotten up several times to void, no urge for BM. No BM since 1700. Systolic BPs in 0000000 with outlier of 70 when patient was sleeping. Patient reports feeling better.

## 2015-12-19 NOTE — Assessment & Plan Note (Signed)
Improved. Pt applauded on succesful weight loss through lifestyle change, and encouraged to continue same. Weight loss goal set for the next several months.  

## 2015-12-19 NOTE — Assessment & Plan Note (Signed)
Controlled, no change in medication Sleep hygiene reviewed and written information offered also. Prescription sent for  medication needed.  

## 2015-12-19 NOTE — Progress Notes (Signed)
Jenna Woods     MRN: [id number]      DOB: 18-Dec-1960   HPI [name] is here for follow up and re-evaluation of chronic medical conditions, medication management and review of any available recent lab and radiology data.  Preventive health is updated, specifically  Cancer screening and Immunization.   Questions or concerns regarding consultations or procedures which the PT has had in the interim are  addressed. The PT denies any adverse reactions to current medications since the last visit.  There are no new concerns.  There are no specific complaints   ROS Denies recent fever or chills. Denies sinus pressure, nasal congestion, ear pain or sore throat. Denies chest congestion, productive cough or wheezing. Denies chest pains, palpitations and leg swelling Denies abdominal pain, nausea, vomiting,diarrhea or constipation.   Denies dysuria, frequency, hesitancy or incontinence. Denies joint pain, swelling and limitation in mobility. Denies headaches, seizures, numbness, or tingling. Denies depression, anxiety or insomnia. Denies skin break down or rash.   PE  BP 124/82   Pulse 100   Ht 5\' 3"  (1.6 m)   Wt 230 lb (104.3 kg)   SpO2 96%   BMI 40.74 kg/m   Patient alert and oriented and in no cardiopulmonary distress.  HEENT: No facial asymmetry, EOMI,   oropharynx pink and moist.  Neck supple no JVD, no mass.  Chest: Clear to auscultation bilaterally.  CVS: S1, S2 no murmurs, no S3.Regular rate.  ABD: Soft non tender.   Ext: No edema  MS: Adequate ROM spine, shoulders, hips and knees.  Skin: Intact, no ulcerations or rash noted.  Psych: Good eye contact, normal affect. Memory intact not anxious or depressed appearing.  CNS: CN 2-12 intact, power,  normal throughout.no focal deficits noted.   Assessment & Plan  Hypertension constipation DASH diet and commitment to daily physical activity for a minimum of 30 minutes discussed and encouraged, as a part of  hypertension management. The importance of attaining a healthy weight is also discussed.  BP/Weight 12/15/2015 11/26/2015 08/28/2015 08/25/2015 07/14/2015 03/27/2015 99991111  Systolic BP [id number] 123456 123456 123456 123456 [id number]  Diastolic BP 82 87 87 84 68 82 95  Wt. (Lbs) 230 210 - 235 240 245 186  BMI 40.74 37.2 - 36.8 38.76 39.56 29.12  Some encounter information is confidential and restricted. Go to Review Flowsheets activity to see all data.       Depression with anxiety Managed by psych, markedly improved  Insomnia due to mental disorder Controlled, no change in medication Sleep hygiene reviewed and written information offered also. Prescription sent for  medication needed.   Morbid obesity Improved. Pt applauded on succesful weight loss through lifestyle change, and encouraged to continue same. Weight loss goal set for the next several months.   IGT (impaired glucose tolerance) Patient educated about the importance of limiting  Carbohydrate intake , the need to commit to daily physical activity for a minimum of 30 minutes , and to commit weight loss. The fact that changes in all these areas will reduce or eliminate all together the development of diabetes is stressed.  Markedly improved  Diabetic Labs Latest Ref Rng & Units 12/01/2015 08/25/2015 04/02/2015 09/15/2014 03/03/2014  HbA1c <5.7 % 5.7(H) - 6.4(H) 6.1(H) 6.2(H)  Chol 125 - 200 mg/dL - - 109(L) - 116  HDL >=46 mg/dL - - 40(L) - 45  Calc LDL <130 mg/dL - - 45 - 56  Triglycerides <150 mg/dL - - 118 - 75  Creatinine 0.50 - 1.05 mg/dL 0.83 0.65 0.87 0.71 0.71   BP/Weight 12/15/2015 11/26/2015 08/28/2015 08/25/2015 07/14/2015 03/27/2015 99991111  Systolic BP A999333 123456 123456 123456 123456 [id number]  Diastolic BP 82 87 87 84 68 82 95  Wt. (Lbs) 230 210 - 235 240 245 186  BMI 40.74 37.2 - 36.8 38.76 39.56 29.12  Some encounter information is confidential and restricted. Go to Review Flowsheets activity to see all data.   No flowsheet data  found.    Urinary incontinence Controlled, no change in medication   Need for prophylactic vaccination and inoculation against influenza After obtaining informed consent, the vaccine is  administered by LPN.

## 2015-12-19 NOTE — Assessment & Plan Note (Signed)
constipation DASH diet and commitment to daily physical activity for a minimum of 30 minutes discussed and encouraged, as a part of hypertension management. The importance of attaining a healthy weight is also discussed.  BP/Weight 12/15/2015 11/26/2015 08/28/2015 08/25/2015 07/14/2015 03/27/2015 99991111  Systolic BP A999333 123456 123456 123456 123456 [id number]  Diastolic BP 82 87 87 84 68 82 95  Wt. (Lbs) 230 210 - 235 240 245 186  BMI 40.74 37.2 - 36.8 38.76 39.56 29.12  Some encounter information is confidential and restricted. Go to Review Flowsheets activity to see all data.

## 2015-12-19 NOTE — Progress Notes (Signed)
Late entry. Called and spoke to patient's nurse at 2000. Getting ready to start blood. Last episode of rectal bleeding occurred at 1700. Nursing reported systolic 0000000, normal MAP.

## 2015-12-19 NOTE — H&P (Signed)
History and Physical    Jenna Woods [id number] DOB: [birth year] DOA: 12/19/2015  PCP: [name], MD  Gastroenterologist: Dr. Laural Golden    Patient coming from: Home  Chief Complaint: Bloody stools.  HPI: Jenna Woods is a 55 y.o. female with medical history significant for pan diverticulosis, diagnosed 08/28/2015 per screening colonoscopy, depression with anxiety, prediabetes, hypertension, SLE, and scleroderma who presents to the ED with a chief complaint of bloody stools. She began to have bloody stools yesterday morning at approximately 3 AM. The first episode consisted of bright red blood with a small amount of stool interspersed. Since that time, she has had 5 subsequent bloody bowel movements yesterday and 4 today including 2 in the emergency department. She actually went to work yesterday and today, but had to leave work today because of lightheadedness, sweatiness, and lightheadedness. She denies abdominal pain or cramping, but has had nausea. A couple weeks ago, she had a "knot" that she felt around her umbilicus. She denies vomiting. She denies fever, chills, headache, complete LOC, chest pain, shortness of breath, palpitations, dysuria, or swelling in her legs. She takes aspirin 81 mg daily which she started approximately 6 months ago. Also, she was treated with a steroid Dosepak a few weeks ago for allergic hives. She denies taking any other NSAIDs on a consistent basis.  ED Course: In the ED, she is afebrile and mildly tachycardic and with a blood pressure in the [id number] systolically. Her lab data are significant for a normal hemoglobin of 12.4, WBC of 10.9, and potassium of 3.5. Her LFTs are within normal limits. She is being admitted for further evaluation and management of GI bleeding.  Review of Systems: As per HPI otherwise 10 point review of systems negative.   Past Medical History:  Diagnosis Date  . Allergic reaction   . Allergy    perrenial   . Anxiety   .  Arthritis   . Depression    h/o suicidal ideation in 2011  . Depression with anxiety   . Diverticulosis of colon 12/19/2015  . GERD (gastroesophageal reflux disease)   . Hypertension 1996  . Obesity   . Peripheral vascular disease ([place])   . Prediabetes 2013  . Scleroderma ([place]) 1998  . Systemic lupus erythematosus ([place]) 1998   Treated at [facility]  . Tobacco abuse, in remission    10-pack-years discontinued in 2007  . Urinary frequency     Past Surgical History:  Procedure Laterality Date  . ABDOMINAL HYSTERECTOMY  1990   Neoplasm  . CESAREAN SECTION     x2  . COLONOSCOPY  2005  . COLONOSCOPY WITH PROPOFOL N/A 08/28/2015   Procedure: COLONOSCOPY WITH PROPOFOL;  Surgeon: Rogene Houston, MD;  [facility];  Service: Endoscopy;  Laterality: N/A;  10:30  . FINGER AMPUTATION     Third finger, bilateral, distal  . NASAL SINUS SURGERY  09/06/2011   Procedure: ENDOSCOPIC SINUS SURGERY;  Surgeon: Ascencion Dike, MD;  Location: [place];  Service: ENT;  Laterality: N/A;  Nasal cerebrospinal fluid leak repair with Left temporalis fascia graft and Left Ear cartilage graft  . VASCULAR SURGERY     Hands, bilaterally, [facility]; right hand partial amputation of middle finger.    Social history: She is divorced. She has 2 sons. 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. She reports that she has quit smoking. Her smoking use included Cigarettes. She has a 6.25 pack-year smoking history. She has  never used smokeless tobacco. She drinks alcohol only on occasion. She reports that she does not use drugs.  Allergies  Allergen Reactions  . Bee Venom Anaphylaxis  . Sesame Oil Anaphylaxis and Swelling    (Sesame seed)  . Molds & Smuts   . Codeine Itching and Rash    Family History  Problem Relation Age of Onset  . Arthritis Mother     Rheumatoid  . Dementia Mother   . Hypertension Mother   . Cirrhosis Father   . Alcohol abuse Father   . Arthritis Maternal  Grandmother   . Drug abuse Sister      Prior to Admission medications   Medication Sig Start Date End Date Taking? Authorizing Provider  ALPRAZolam Duanne Moron) 1 MG tablet Take 1 tablet (1 mg total) by mouth 3 (three) times daily. 11/02/15  Yes Cloria Spring, MD  amLODipine (NORVASC) 10 MG tablet TAKE ONE (1) TABLET EACH DAY Patient taking differently: Take 10 mg by mouth daily. TAKE ONE (1) TABLET EACH DAY 07/14/15  Yes Fayrene Helper, MD  aspirin EC 81 MG tablet Take 81 mg by mouth daily.   Yes Historical Provider, MD  Calcium Carbonate-Vit D-Min (CALCIUM 1200) 1200-1000 MG-UNIT CHEW Chew 1 tablet by mouth daily.   Yes Historical Provider, MD  fesoterodine (TOVIAZ) 8 MG TB24 tablet Take 1 tablet (8 mg total) by mouth daily. 11/19/15  Yes Fayrene Helper, MD  metFORMIN (GLUCOPHAGE) 500 MG tablet Take 1 tablet (500 mg total) by mouth daily with breakfast. 12/15/15  Yes Fayrene Helper, MD  NEXIUM 40 MG capsule TAKE ONE (1) CAPSULE EACH DAY BEFORE BREAKFAST 09/01/15  Yes Fayrene Helper, MD  traZODone (DESYREL) 100 MG tablet Take 1 tablet (100 mg total) by mouth at bedtime as needed for sleep. 11/02/15  Yes Cloria Spring, MD  montelukast (SINGULAIR) 10 MG tablet Take 1 tablet (10 mg total) by mouth at bedtime. 12/15/15   Fayrene Helper, MD    Physical Exam: Vitals:   12/19/15 1300 12/19/15 1330 12/19/15 1400 12/19/15 1439  BP: 103/72 102/80 107/82 107/58  Pulse: 96 95 95 102  Resp: 17 12 23 17   Temp:      TempSrc:      SpO2: 100% 100% 100% 100%  Weight:      Height:          Constitutional: Pleasant obese 55 year old African-American woman in no acute distress. Vitals:   12/19/15 1300 12/19/15 1330 12/19/15 1400 12/19/15 1439  BP: 103/72 102/80 107/82 107/58  Pulse: 96 95 95 102  Resp: 17 12 23 17   Temp:      TempSrc:      SpO2: 100% 100% 100% 100%  Weight:      Height:       Eyes: PERRL, lids and conjunctivae normal ENMT: Mucous membranes are mildly dry.  Posterior pharynx clear of any exudate or lesions.Normal dentition.  Neck: normal, supple, no masses, no thyromegaly Respiratory: clear to auscultation bilaterally, no wheezing, no crackles. Normal respiratory effort. No accessory muscle use.  Cardiovascular: S1, S2, with borderline tachycardia. No extremity edema. 2+ pedal pulses. No carotid bruits.  Abdomen: Obese, positive bowel sounds, minimal hypogastric tenderness, no masses palpated. No hepatosplenomegaly.  Rectal: Per the EDP, Dr. Roderic Palau, her stool is maroon colored and heme positive. (I witnessed a large volume maroon/red bloody stool in the ED during my examination). Musculoskeletal: no clubbing / cyanosis. No joint deformity upper and lower extremities. Good ROM, no  contractures. Normal muscle tone.  Skin: no rashes, lesions, ulcers. No induration Neurologic: CN 2-12 grossly intact. Sensation intact, DTR normal. Strength 5/5 in all 4.  Psychiatric: Normal judgment and insight. Alert and oriented x 3. Normal mood.     Labs on Admission: I have personally reviewed following labs and imaging studies  CBC:  Recent Labs Lab 12/19/15 1149  WBC 10.9*  NEUTROABS 8.0*  HGB 12.4  HCT 36.9  MCV 90.4  PLT [id number]   Basic Metabolic Panel:  Recent Labs Lab 12/19/15 1149  NA 137  K 3.5  CL 104  CO2 26  GLUCOSE 102*  BUN 13  CREATININE 0.79  CALCIUM 9.1   GFR: Estimated Creatinine Clearance: 85 mL/min (by C-G formula based on SCr of 0.79 mg/dL). Liver Function Tests:  Recent Labs Lab 12/19/15 1149  AST 14*  ALT 15  ALKPHOS 69  BILITOT 0.5  PROT 6.9  ALBUMIN 3.8   No results for input(s): LIPASE, AMYLASE in the last 168 hours. No results for input(s): AMMONIA in the last 168 hours. Coagulation Profile: No results for input(s): INR, PROTIME in the last 168 hours. Cardiac Enzymes: No results for input(s): CKTOTAL, CKMB, CKMBINDEX, TROPONINI in the last 168 hours. BNP (last 3 results) No results for input(s): PROBNP  in the last 8760 hours. HbA1C: No results for input(s): HGBA1C in the last 72 hours. CBG: No results for input(s): GLUCAP in the last 168 hours. Lipid Profile: No results for input(s): CHOL, HDL, LDLCALC, TRIG, CHOLHDL, LDLDIRECT in the last 72 hours. Thyroid Function Tests: No results for input(s): TSH, T4TOTAL, FREET4, T3FREE, THYROIDAB in the last 72 hours. Anemia Panel: No results for input(s): VITAMINB12, FOLATE, FERRITIN, TIBC, IRON, RETICCTPCT in the last 72 hours. Urine analysis: No results found for: COLORURINE, APPEARANCEUR, LABSPEC, PHURINE, GLUCOSEU, HGBUR, BILIRUBINUR, KETONESUR, PROTEINUR, UROBILINOGEN, NITRITE, LEUKOCYTESUR Sepsis Labs: !!!!!!!!!!!!!!!!!!!!!!!!!!!!!!!!!!!!!!!!!!!! @LABRCNTIP (procalcitonin:4,lacticidven:4) )No results found for this or any previous visit (from the past 240 hour(s)).   Radiological Exams on Admission: No results found.  EKG:Not ordered  Assessment/Plan Principal Problem:   Acute lower GI bleeding Active Problems:   Rectal bleeding   Diverticulosis of colon   Morbid obesity (HCC)   Hypertension   GERD (gastroesophageal reflux disease)   Systemic lupus erythematosus (HCC)   IGT (impaired glucose tolerance)   Depression with anxiety    1. Lower GI bleeding. The patient presents with large volume maroon and red colored blood which I witnessed in the exam room. Surprisingly, she is not very hemodynamically unstable. Her hemoglobin is 12.4, but this is likely from hemoconcentration. I expect her hemoglobin to fall at least 1 or 2 g over the next 24 hours. Recent colonoscopy 08/2005 revealed pandiverticulosis. This is likely the etiology of her bleeding. -Patient received a bolus of IV fluids in the ED. We will continue vigorous IV fluids. -She will be monitored in the stepdown unit overnight. -We'll continue her PPI. We'll start a clear liquid diet with no advancement. We'll make her nothing by mouth after midnight pending GI  evaluation. -We'll consult GI for further evaluation and management. -We'll hold aspirin. -We'll monitor her CBC every 8 hours for the next 24-48 hours. -We'll type and screen packed red blood cells. Would favor transfusing her if her hemoglobin falls more than 2 g over the next 12-24 hours.  Low-normal blood pressure/history essential hypertension. The patient is treated chronically with amlodipine. Her blood pressure is soft, likely from hypovolemia.  -We will hold amlodipine. We'll continue hydration with IV  fluids.  Prediabetes/glucose intolerance. Patient is treated chronically with metformin. Her glucose is 102. -We'll continue to monitor her CBG daily each morning. If it increases above 150 consistently, then we'll start sliding scale NovoLog. Her hemoglobin A1c was 5.7 in September 2017.  Scleroderma/SLE. She reports not being on any pharmacological agent.  Chronic depression with anxiety.  Patient is treated chronically with Xanax. It will be continued.    DVT prophylaxis: SCDs Code Status: Full code Family Communication: Discussed briefly with sister and niece Disposition Plan: Discharge to home when clinically appropriate Consults called: Gastroenterology Admission status: INPATIENT Lupita Dawn MD [facility] Pager [phone] If 7PM-7AM, please contact night-coverage [email or web address] Password [email or web address]  12/19/2015, 3:41 PM

## 2015-12-19 NOTE — Assessment & Plan Note (Signed)
After obtaining informed consent, the vaccine is  administered by LPN.  

## 2015-12-19 NOTE — Assessment & Plan Note (Signed)
Managed by psych, markedly improved

## 2015-12-19 NOTE — ED Notes (Signed)
Upon transfer to ICU, pt c/o dizziness on elevator. BP checked and showed 84/69, HR 99. Mikki Santee, RN notified upon pt's arrival to ICU room. Dr. Caryn Section notified of pt's dizziness and vitals.

## 2015-12-19 NOTE — Consult Note (Signed)
Referring Provider: Rexene Alberts, MD Primary Care Physician:  [name], MD Primary Gastroenterologist:  Hildred Laser, md  Reason for Consultation:  Gi bleeding.   HPI: Jenna Woods is a 55 y.o. female admitted acute onset painless large volume blood per rectum. Symptoms started suddenly yesterday with 5 episodes. 4 episodes so far today. Reports blood clots. No melena. No abdominal pain. No vomiting. Describes bleeding "like the red sea". She takes ASA 81mg  daily. No other NSAIDs. Recent steroid dose pack for rash. TCS 08/2015 with Dr. Laural Golden showed pancolonic diverticulosis, external hemorrhoids. She was noted to have maroon stool in the ER. Her Hgb 12.4 at noon. 3 months ago was 13. INR normal. BP systolic from 0000000 to upper 80s. HR in 90s.    Prior to Admission medications   Medication Sig Start Date End Date Taking? Authorizing Provider  ALPRAZolam Duanne Moron) 1 MG tablet Take 1 tablet (1 mg total) by mouth 3 (three) times daily. 11/02/15  Yes Cloria Spring, MD  amLODipine (NORVASC) 10 MG tablet TAKE ONE (1) TABLET EACH DAY Patient taking differently: Take 10 mg by mouth daily. TAKE ONE (1) TABLET EACH DAY 07/14/15  Yes Fayrene Helper, MD  aspirin EC 81 MG tablet Take 81 mg by mouth daily.   Yes Historical Provider, MD  Calcium Carbonate-Vit D-Min (CALCIUM 1200) 1200-1000 MG-UNIT CHEW Chew 1 tablet by mouth daily.   Yes Historical Provider, MD  fesoterodine (TOVIAZ) 8 MG TB24 tablet Take 1 tablet (8 mg total) by mouth daily. 11/19/15  Yes Fayrene Helper, MD  metFORMIN (GLUCOPHAGE) 500 MG tablet Take 1 tablet (500 mg total) by mouth daily with breakfast. 12/15/15  Yes Fayrene Helper, MD  NEXIUM 40 MG capsule TAKE ONE (1) CAPSULE EACH DAY BEFORE BREAKFAST 09/01/15  Yes Fayrene Helper, MD  traZODone (DESYREL) 100 MG tablet Take 1 tablet (100 mg total) by mouth at bedtime as needed for sleep. 11/02/15  Yes Cloria Spring, MD  montelukast (SINGULAIR) 10 MG tablet Take 1 tablet  (10 mg total) by mouth at bedtime. 12/15/15   Fayrene Helper, MD    Current Facility-Administered Medications  Medication Dose Route Frequency Provider Last Rate Last Dose  . 0.9 %  sodium chloride infusion   Intravenous Once Rexene Alberts, MD      . 0.9 % NaCl with KCl 20 mEq/ L  infusion   Intravenous Continuous Rexene Alberts, MD      . acetaminophen (TYLENOL) tablet 650 mg  650 mg Oral Q6H PRN Rexene Alberts, MD       Or  . acetaminophen (TYLENOL) suppository 650 mg  650 mg Rectal Q6H PRN Rexene Alberts, MD      . albuterol (PROVENTIL) (2.5 MG/3ML) 0.083% nebulizer solution 2.5 mg  2.5 mg Nebulization Q2H PRN Rexene Alberts, MD      . montelukast (SINGULAIR) tablet 10 mg  10 mg Oral QHS Rexene Alberts, MD      . ondansetron [facility]) tablet 4 mg  4 mg Oral Q6H PRN Rexene Alberts, MD       Or  . ondansetron (ZOFRAN) injection 4 mg  4 mg Intravenous Q6H PRN Rexene Alberts, MD      . Derrill Memo ON 12/20/2015] pantoprazole (PROTONIX) EC tablet 80 mg  80 mg Oral Q1200 Rexene Alberts, MD      . sodium chloride 0.9 % bolus 1,000 mL  1,000 mL Intravenous Once Rexene Alberts, MD      . traZODone (DESYREL) tablet 100  mg  100 mg Oral QHS PRN Rexene Alberts, MD        Allergies as of 12/19/2015 - Review Complete 12/19/2015  Allergen Reaction Noted  . Bee venom Anaphylaxis 10/02/2013  . Sesame oil Anaphylaxis and Swelling 10/26/2010  . Molds & smuts  04/22/2011  . Codeine Itching and Rash 05/01/2009    Past Medical History:  Diagnosis Date  . Allergic reaction   . Allergy    perrenial   . Anxiety   . Arthritis   . Depression    h/o suicidal ideation in 2011  . Depression with anxiety   . Diverticulosis of colon 12/19/2015  . GERD (gastroesophageal reflux disease)   . Hypertension 1996  . Obesity   . Peripheral vascular disease ([place])   . Prediabetes 2013  . Scleroderma ([place]) 1998  . Systemic lupus erythematosus ([place]) 1998   Treated at [facility]  . Tobacco abuse, in remission    10-pack-years  discontinued in 2007  . Urinary frequency     Past Surgical History:  Procedure Laterality Date  . ABDOMINAL HYSTERECTOMY  1990   Neoplasm  . CESAREAN SECTION     x2  . COLONOSCOPY  2005  . COLONOSCOPY WITH PROPOFOL N/A 08/28/2015   Procedure: COLONOSCOPY WITH PROPOFOL;  Surgeon: Rogene Houston, MD;  [facility];  Service: Endoscopy;  Laterality: N/A;  10:30  . FINGER AMPUTATION     Third finger, bilateral, distal  . NASAL SINUS SURGERY  09/06/2011   Procedure: ENDOSCOPIC SINUS SURGERY;  Surgeon: Ascencion Dike, MD;  Location: [place];  Service: ENT;  Laterality: N/A;  Nasal cerebrospinal fluid leak repair with Left temporalis fascia graft and Left Ear cartilage graft  . VASCULAR SURGERY     Hands, bilaterally, [facility]; right hand partial amputation of middle finger.    Family History  Problem Relation Age of Onset  . Arthritis Mother     Rheumatoid  . Dementia Mother   . Hypertension Mother   . Cirrhosis Father   . Alcohol abuse Father   . Arthritis Maternal Grandmother   . Drug abuse Sister     Social History   Social History  . Marital status: Single    Spouse name: N/A  . Number of children: N/A  . Years of education: N/A   Occupational History  . 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 Smoking status: Former Smoker    Packs/day: 0.25    Years: 25.00    Types: Cigarettes  . Smokeless tobacco: Never Used  . Alcohol use No     Comment: quit in 2004  . Drug use: No     Comment: use to use pot   . Sexual activity: Yes    Birth control/ protection: Surgical   Other Topics Concern  . Not on file   Social History Narrative  . No narrative on file     ROS:  General: Negative for anorexia, weight loss, fever, chills, fatigue, +weakness. Eyes: Negative for vision changes.  ENT: Negative for hoarseness, difficulty swallowing , nasal congestion. CV: Negative for  chest pain, angina, palpitations, dyspnea on exertion, peripheral edema.  Respiratory: Negative for dyspnea at rest, dyspnea on exertion, cough, sputum, wheezing.  GI: See history of present illness. GU:  Negative for dysuria, hematuria, urinary incontinence, urinary frequency, nocturnal urination.  MS: Negative for joint pain, low back pain.  Derm: Negative  for rash or itching.  Neuro: Negative for weakness, abnormal sensation, seizure, frequent headaches, memory loss, confusion.  Psych: Negative for anxiety, depression, suicidal ideation, hallucinations.  Endo: Negative for unusual weight change.  Heme: Negative for bruising or bleeding. Allergy: Negative for rash or hives.       Physical Examination: Vital signs in last 24 hours: Temp:  [98.1 F (36.7 C)] 98.1 F (36.7 C) (10/14 1116) Pulse Rate:  [95-111] 99 (10/14 1650) Resp:  [12-23] 13 (10/14 1551) BP: (84-119)/(58-98) 84/69 (10/14 1650) SpO2:  [100 %] 100 % (10/14 1650) Weight:  [209 lb (94.8 kg)] 209 lb (94.8 kg) (10/14 1116)    General: Well-nourished, well-developed in no acute distress.  Head: Normocephalic, atraumatic.   Eyes: Conjunctiva pink, no icterus. Mouth: Oropharyngeal mucosa moist and pink , no lesions erythema or exudate. Neck: Supple without thyromegaly, masses, or lymphadenopathy.  Lungs: Clear to auscultation bilaterally.  Heart: Regular rate and rhythm, no murmurs rubs or gallops.  Abdomen: Bowel sounds are normal, nontender, nondistended, no hepatosplenomegaly or masses, no abdominal bruits or    hernia , no rebound or guarding.   Rectal: not performed Extremities: No lower extremity edema, clubbing, deformity.  Neuro: Alert and oriented x 4 , grossly normal neurologically.  Skin: Warm and dry, no rash or jaundice.   Psych: Alert and cooperative, normal mood and affect.        Intake/Output from previous day: No intake/output data recorded. Intake/Output this shift: Total I/O In: 1000 [IV  Piggyback:1000] Out: -   Lab Results: CBC  Recent Labs  12/19/15 1149  WBC 10.9*  HGB 12.4  HCT 36.9  MCV 90.4  PLT 303   BMET  Recent Labs  12/19/15 1149  NA 137  K 3.5  CL 104  CO2 26  GLUCOSE 102*  BUN 13  CREATININE 0.79  CALCIUM 9.1   LFT  Recent Labs  12/19/15 1149  BILITOT 0.5  ALKPHOS 69  AST 14*  ALT 15  PROT 6.9  ALBUMIN 3.8    Lipase No results for input(s): LIPASE in the last 72 hours.  PT/INR  Recent Labs  12/19/15 1545  LABPROT 12.8  INR 0.96      Imaging Studies: No results found.Minnie.Brome week]   Impression: 55 y/o female with acute onset painless large volume hematochezia of two days duration. No improvement in bleeding today. BPs soft. Suspect diverticular bleeding. Patient continues to pass large amounts of stool, witnessed by me at 5:30pm. Associated with diaphoresis. BP [id number] systolic.   Plan: 1. Fluid resuscitation as planned. 2. Recheck H/H now given additional episodes of bleeding.  3. Transfuse as needed. 4. Continue clear liquids. If patient remains stable and there is no improvement in bleeding, would anticipate colonoscopy tomorrow.   We would like to thank you for the opportunity to participate in the care of Letitia M Shaneyfelt.  Laureen Ochs. Bernarda Caffey [facility] [phone] 10/14/20175:31 PM     LOS: 0 days

## 2015-12-19 NOTE — ED Provider Notes (Signed)
[facility] DEPT Provider Note   CSN: [id number] Arrival date & time: 12/19/15  1111  By signing my name below, I, Higinio Plan, attest that this documentation has been prepared under the direction and in the presence of Milton Ferguson, MD . Electronically Signed: Higinio Plan, Scribe. 12/19/2015. 11:41 AM.  History   Chief Complaint Chief Complaint  Patient presents with  . Rectal Bleeding   Pt complains of rectal bleeding for two days   The history is provided by the patient. No language interpreter was used.  Rectal Bleeding  Quality:  Maroon Duration:  1 day Chronicity:  New Relieved by:  Nothing Ineffective treatments:  None tried Associated symptoms: no abdominal pain   Risk factors: no liver disease    HPI Comments: Jenna Woods is a 54 y.o. female with PMHx of HTN, who presents to the Emergency Department for an evaluation of rectal bleeding that began yesterday morning. Pt reports she noticed "dark" blood in the commode after having a BM yesterday before work. She notes her stool also appeared "blue-purple" which is unusual for her. She states associated decreased appetite for the past 4 days that is still present now. Pt reports PSHx of colonoscopy on 6/23 by Dr. Laural Golden in which she was told of no complications. She states she takes 81 mg aspirin every day.   Past Medical History:  Diagnosis Date  . Allergic reaction   . Allergy    perrenial   . Anxiety   . Arthritis   . Depression    h/o suicidal ideation in 2011  . Depression with anxiety   . GERD (gastroesophageal reflux disease)   . Hypertension 1996  . Obesity   . Peripheral vascular disease ([place])   . Prediabetes 2013  . Scleroderma ([place]) 1998  . Systemic lupus erythematosus ([place]) 1998   Treated at [facility]  . Tobacco abuse, in remission    10-pack-years discontinued in 2007  . Urinary frequency     Patient Active Problem List   Diagnosis Date Noted  . Insomnia due to mental disorder 09/15/2014   . Major depression 08/29/2014  . Need for prophylactic vaccination and inoculation against influenza 11/11/2013  . Hot flashes, menopausal 08/18/2013  . Osteoarthritis of left knee 08/05/2013  . Depression with anxiety 04/02/2013  . IGT (impaired glucose tolerance) 06/05/2012  . Vitamin D deficiency 09/23/2011  . Urinary incontinence 10/26/2010  . GERD (gastroesophageal reflux disease)   . Scleroderma ([place])   . Systemic lupus erythematosus ([place])   . Morbid obesity ([place]) 04/02/2009  . Hypertension 04/02/2009    Past Surgical History:  Procedure Laterality Date  . ABDOMINAL HYSTERECTOMY  1990   Neoplasm  . CESAREAN SECTION     x2  . COLONOSCOPY  2005  . COLONOSCOPY WITH PROPOFOL N/A 08/28/2015   Procedure: COLONOSCOPY WITH PROPOFOL;  Surgeon: Rogene Houston, MD;  [facility];  Service: Endoscopy;  Laterality: N/A;  10:30  . FINGER AMPUTATION     Third finger, bilateral, distal  . NASAL SINUS SURGERY  09/06/2011   Procedure: ENDOSCOPIC SINUS SURGERY;  Surgeon: Ascencion Dike, MD;  Location: [facility];  Service: ENT;  Laterality: N/A;  Nasal cerebrospinal fluid leak repair with Left temporalis fascia graft and Left Ear cartilage graft  . VASCULAR SURGERY     Hands, bilaterally, [facility]; right hand partial amputation of middle finger.    OB History    Gravida Para Term Preterm AB Living   4 2  2 2   SAB TAB Ectopic Multiple Live Births   2               Home Medications    Prior to Admission medications   Medication Sig Start Date End Date Taking? Authorizing Provider  ALPRAZolam Duanne Moron) 1 MG tablet Take 1 tablet (1 mg total) by mouth 3 (three) times daily. 11/02/15   Cloria Spring, MD  amLODipine (NORVASC) 10 MG tablet TAKE ONE (1) TABLET EACH DAY 07/14/15   Fayrene Helper, MD  aspirin EC 81 MG tablet Take 81 mg by mouth daily.    Historical Provider, MD  Calcium Carbonate-Vit D-Min (CALCIUM 1200) 1200-1000 MG-UNIT CHEW Chew 1 tablet  by mouth daily.    Historical Provider, MD  fesoterodine (TOVIAZ) 8 MG TB24 tablet Take 1 tablet (8 mg total) by mouth daily. 11/19/15   Fayrene Helper, MD  metFORMIN (GLUCOPHAGE) 500 MG tablet Take 1 tablet (500 mg total) by mouth daily with breakfast. 12/15/15   Fayrene Helper, MD  montelukast (SINGULAIR) 10 MG tablet Take 1 tablet (10 mg total) by mouth at bedtime. 12/15/15   Fayrene Helper, MD  NEXIUM 40 MG capsule TAKE ONE (1) CAPSULE EACH DAY BEFORE BREAKFAST 09/01/15   Fayrene Helper, MD  traZODone (DESYREL) 100 MG tablet Take 1 tablet (100 mg total) by mouth at bedtime as needed for sleep. 11/02/15   Cloria Spring, MD    Family History Family History  Problem Relation Age of Onset  . Arthritis Mother     Rheumatoid  . Dementia Mother   . Hypertension Mother   . Cirrhosis Father   . Alcohol abuse Father   . Arthritis Maternal Grandmother   . Drug abuse Sister     Social History Social History  Substance Use Topics  . Smoking status: Former Smoker    Packs/day: 0.25    Years: 25.00    Types: Cigarettes  . Smokeless tobacco: Never Used  . Alcohol use No     Comment: quit in 2004     Allergies   Bee venom; Sesame oil; Molds & smuts; and Codeine   Review of Systems Review of Systems  Constitutional: Positive for appetite change. Negative for fatigue.  HENT: Negative for congestion, ear discharge and sinus pressure.   Eyes: Negative for discharge.  Respiratory: Negative for cough.   Cardiovascular: Negative for chest pain.  Gastrointestinal: Positive for blood in stool and hematochezia. Negative for abdominal pain and diarrhea.  Genitourinary: Negative for frequency and hematuria.  Musculoskeletal: Negative for back pain.  Skin: Negative for rash.  Neurological: Negative for seizures and headaches.  Psychiatric/Behavioral: Negative for hallucinations.   Physical Exam Updated Vital Signs BP 106/76 (BP Location: Left Arm)   Pulse 111   Temp 98.1  F (36.7 C) (Oral)   Resp 20   Ht 5\' 3"  (1.6 m)   Wt 209 lb (94.8 kg)   BMI 37.02 kg/m   Physical Exam  Constitutional: She is oriented to person, place, and time. She appears well-developed.  HENT:  Head: Normocephalic.  Eyes: Conjunctivae and EOM are normal. No scleral icterus.  Neck: Neck supple. No thyromegaly present.  Cardiovascular: Normal rate and regular rhythm.  Exam reveals no gallop and no friction rub.   No murmur heard. Pulmonary/Chest: No stridor. She has no wheezes. She has no rales. She exhibits no tenderness.  Abdominal: She exhibits no distension. There is no tenderness. There is no rebound.  Genitourinary:  Genitourinary Comments: Maroon stool that is heme positive  Musculoskeletal: Normal range of motion. She exhibits no edema.  Lymphadenopathy:    She has no cervical adenopathy.  Neurological: She is oriented to person, place, and time. She exhibits normal muscle tone. Coordination normal.  Skin: No rash noted. No erythema.  Psychiatric: She has a normal mood and affect. Her behavior is normal.   ED Treatments / Results  Labs (all labs ordered are listed, but only abnormal results are displayed) Labs Reviewed - No data to display  EKG  EKG Interpretation None       Radiology No results found.  Procedures Procedures (including critical care time)  Medications Ordered in ED Medications - No data to display  DIAGNOSTIC STUDIEs  COORDINATION OF CARE:  11:38 AM Discussed treatment plan with pt at bedside and pt agreed to plan.  Initial Impression / Assessment and Plan / ED Course  I have reviewed the triage vital signs and the nursing notes.  Pertinent labs & imaging results that were available during my care of the patient were reviewed by me and considered in my medical decision making (see chart for details).  Clinical Course  CRITICAL CARE Performed by: Malekai Markwood L Total critical care time: 35 minutes Critical care time was  exclusive of separately billable procedures and treating other patients. Critical care was necessary to treat or prevent imminent or life-threatening deterioration. Critical care was time spent personally by me on the following activities: development of treatment plan with patient and/or surrogate as well as nursing, discussions with consultants, evaluation of patient's response to treatment, examination of patient, obtaining history from patient or surrogate, ordering and performing treatments and interventions, ordering and review of laboratory studies, ordering and review of radiographic studies, pulse oximetry and re-evaluation of patient's condition.   Pt will be admitted for lower gi bleed  I personally performed the services described in this documentation, which was scribed in my presence. The recorded information has been reviewed and is accurate.   Final Clinical Impressions(s) / ED Diagnoses   Final diagnoses:  None   The chart was scribed for me under my direct supervision.  I personally performed the history, physical, and medical decision making and all procedures in the evaluation of this patient..  New Prescriptions New Prescriptions   No medications on file     Milton Ferguson, MD 12/20/15 [phone]

## 2015-12-19 NOTE — ED Notes (Signed)
Attempted to call report, no answer in ICU.

## 2015-12-19 NOTE — ED Triage Notes (Signed)
Rectal bleeding for the past 2 days

## 2015-12-20 DIAGNOSIS — I959 Hypotension, unspecified: Secondary | ICD-10-CM | POA: Diagnosis present

## 2015-12-20 DIAGNOSIS — D62 Acute posthemorrhagic anemia: Secondary | ICD-10-CM

## 2015-12-20 DIAGNOSIS — I9589 Other hypotension: Secondary | ICD-10-CM

## 2015-12-20 LAB — CBC
HCT: 31.4 % — ABNORMAL LOW (ref 36.0–46.0)
HEMOGLOBIN: 10.4 g/dL — AB (ref 12.0–15.0)
MCH: 29.5 pg (ref 26.0–34.0)
MCHC: 33.1 g/dL (ref 30.0–36.0)
MCV: 89.2 fL (ref 78.0–100.0)
Platelets: 214 10*3/uL (ref 150–400)
RBC: 3.52 MIL/uL — ABNORMAL LOW (ref 3.87–5.11)
RDW: 14.9 % (ref 11.5–15.5)
WBC: 6.9 10*3/uL (ref 4.0–10.5)

## 2015-12-20 LAB — COMPREHENSIVE METABOLIC PANEL
ALT: 11 U/L — AB (ref 14–54)
AST: 13 U/L — AB (ref 15–41)
Albumin: 2.8 g/dL — ABNORMAL LOW (ref 3.5–5.0)
Alkaline Phosphatase: 47 U/L (ref 38–126)
BUN: 9 mg/dL (ref 6–20)
CALCIUM: 7.7 mg/dL — AB (ref 8.9–10.3)
CO2: 24 mmol/L (ref 22–32)
Chloride: 115 mmol/L — ABNORMAL HIGH (ref 101–111)
Creatinine, Ser: 0.6 mg/dL (ref 0.44–1.00)
GFR calc non Af Amer: >60 mL/min (ref >60)
GLUCOSE: 100 mg/dL — AB (ref 65–99)
Potassium: 3.7 mmol/L (ref 3.5–5.1)
SODIUM: 141 mmol/L (ref 135–145)
Total Bilirubin: 1 mg/dL (ref 0.3–1.2)
Total Protein: 5.1 g/dL — ABNORMAL LOW (ref 6.5–8.1)

## 2015-12-20 MED ORDER — CHLORHEXIDINE GLUCONATE CLOTH 2 % EX PADS
6.0000 | MEDICATED_PAD | Freq: Every day | CUTANEOUS | Status: DC
  Administered 2015-12-20 – 2015-12-23 (×3): 6 via TOPICAL

## 2015-12-20 MED ORDER — MUPIROCIN 2 % EX OINT
1.0000 "application " | TOPICAL_OINTMENT | Freq: Two times a day (BID) | CUTANEOUS | Status: DC
  Administered 2015-12-20 – 2015-12-23 (×7): 1 via NASAL
  Filled 2015-12-20 (×2): qty 22

## 2015-12-20 MED ORDER — FESOTERODINE FUMARATE ER 4 MG PO TB24
8.0000 mg | ORAL_TABLET | Freq: Every day | ORAL | Status: DC
  Administered 2015-12-20 – 2015-12-23 (×4): 8 mg via ORAL
  Filled 2015-12-20 (×6): qty 2

## 2015-12-20 MED ORDER — FESOTERODINE FUMARATE ER 4 MG PO TB24
8.0000 mg | ORAL_TABLET | Freq: Every day | ORAL | Status: DC
Start: 2015-12-20 — End: 2015-12-20
  Filled 2015-12-20 (×2): qty 2

## 2015-12-20 NOTE — Progress Notes (Signed)
PROGRESS NOTE    Jenna Woods  [id number] DOB: 24-Feb-1961 DOA: 12/19/2015 PCP: [name], MD  Gastroenterologist: Dr. Laural Golden  Brief Narrative:  Patient is a 55 y.o. PMH significant for pan diverticulosis, diagnosed 08/28/2015 per screening colonoscopy, depression with anxiety, prediabetes, hypertension, SLE, and scleroderma who presented to the ED with a chief complaint of multiple lage volume bloody stools. She denied any significant abdominal discomfort. She reported no chronic NSAID use with exception of 81 mg aspirin daily. Also, she was recently treated with a steroid Dosepak for hives.  In the ED, she was afebrile and mildly tachycardic and with a blood pressure in the [id number] systolically. Her lab data were significant for a normal hemoglobin of 12.4, WBC of 10.9, and potassium of 3.5. Her LFTs were within normal limits. PTT/PT are pending. She was admitted for further evaluation and management of GI bleeding.   Assessment & Plan:   Principal Problem:   Acute lower GI bleeding Active Problems:   Diverticulosis of colon   Acute blood loss anemia   Hypotension   Morbid obesity (HCC)   Hypertension   GERD (gastroesophageal reflux disease)   Systemic lupus erythematosus (HCC)   IGT (impaired glucose tolerance)   Depression with anxiety   1. Large-volume lower GI bleeding, likely from diverticular bleeding causing acute blood loss anemia. Patient's recent colonoscopy in 08/2005 revealed pandiverticulosis. It is highly suspected that this is the source of her GI bleeding. She received a liter of normal saline in the ED, but her blood pressure fell to the 80s, necessitating a change to the stepdown unit. She was bolused another liter of normal saline and transfused 2 units of packed red blood cells for hypovolemic hypotension. She was continued on PPI therapy. -Her hemoglobin was 12.4 on admission. Following 2 units of packed red blood cells, it was 10.1. Today it is stable  at 10.4. -GI was consulted and had anticipated a colonoscopy, but the patient's bleeding has stopped for now. Per GI, will continue to monitor her closely and continue current management. No plans for colonoscopy unless she rebleeds. -We'll continue IV fluid hydration. We'll continue to monitor her CBC.  Hypotension secondary to hypovolemia. Amlodipine was withheld on admission. The patient's blood pressure fell to the 80s following admission. She was bolused another liter of normal saline and transfused 2 units of packed red blood cells. She was then started on maintenance IV fluids. -Her blood pressure has improved. Will continue current treatment.  Prediabetes/glucose intolerance. Patient is treated chronically with metformin. It was held on admission. -We'll continue to monitor her CBG daily each morning. If it increases above 150 consistently, then we'll start sliding scale NovoLog. Her hemoglobin A1c was 5.7 in September 2017.  Scleroderma/SLE. She reports not being on any pharmacological agent.  Chronic depression with anxiety.  Patient is treated chronically with Xanax. It was continued.    DVT prophylaxis: Code Status: Full code Family Communication: Family not available. ( The patient prefers that the medical team not discuss her medical issues with her family.) Disposition Plan: Discharged to home in clinically appropriate   Consultants:   Gastroenterology  Procedures:   Packed red blood cell transfusions 2 on 12/19/15.  Antimicrobials:   None    Subjective: Patient says that she feels better. She has not had a bowel movement since yesterday evening. She feels lightheaded when she gets up to the bedside commode with assistance.  Objective: Vitals:   12/20/15 0400 12/20/15 0500 12/20/15 0600 12/20/15 [id number]  BP: 94/69 (!) 86/56 (!) 88/63   Pulse: 87 87 80   Resp: (!) 23 18 (!) 23 20  Temp: 98 F (36.7 C)   98.3 F (36.8 C)  TempSrc: Oral   Oral  SpO2:  96% 100% 100%   Weight:      Height:      Blood pressure in the room 110/65.   Intake/Output Summary (Last 24 hours) at 12/20/15 1041 Last data filed at 12/20/15 0600  Gross per 24 hour  Intake             3340 ml  Output              500 ml  Net             2840 ml   Filed Weights   12/19/15 1116 12/19/15 1800  Weight: 94.8 kg (209 lb) 102.7 kg (226 lb 6.6 oz)    Examination:  General exam: Appears calm and comfortable  Respiratory system: Clear to auscultation. Respiratory effort normal. Cardiovascular system: S1 & S2 heard, RRR. No JVD, murmurs, rubs, gallops or clicks. No pedal edema. Gastrointestinal system: Abdomen is nondistended, soft and nontender. No organomegaly or masses felt. Normal bowel sounds heard. Central nervous system: Alert and oriented. No focal neurological deficits. Extremities: Symmetric 5 x 5 power. Skin: No rashes, lesions or ulcers Psychiatry: Judgement and insight appear normal. Mood & affect appropriate.     Data Reviewed: I have personally reviewed following labs and imaging studies  CBC:  Recent Labs Lab 12/19/15 1149 12/19/15 1743 12/20/15 0428  WBC 10.9*  --  6.9  NEUTROABS 8.0*  --   --   HGB 12.4 10.1* 10.4*  HCT 36.9 30.7* 31.4*  MCV 90.4  --  89.2  PLT 303  --  [id number]   Basic Metabolic Panel:  Recent Labs Lab 12/19/15 1149 12/20/15 0428  NA 137 141  K 3.5 3.7  CL 104 115*  CO2 26 24  GLUCOSE 102* 100*  BUN 13 9  CREATININE 0.79 0.60  CALCIUM 9.1 7.7*   GFR: Estimated Creatinine Clearance: 88.8 mL/min (by C-G formula based on SCr of 0.6 mg/dL). Liver Function Tests:  Recent Labs Lab 12/19/15 1149 12/20/15 0428  AST 14* 13*  ALT 15 11*  ALKPHOS 69 47  BILITOT 0.5 1.0  PROT 6.9 5.1*  ALBUMIN 3.8 2.8*   No results for input(s): LIPASE, AMYLASE in the last 168 hours. No results for input(s): AMMONIA in the last 168 hours. Coagulation Profile:  Recent Labs Lab 12/19/15 1545  INR 0.96   Cardiac  Enzymes: No results for input(s): CKTOTAL, CKMB, CKMBINDEX, TROPONINI in the last 168 hours. BNP (last 3 results) No results for input(s): PROBNP in the last 8760 hours. HbA1C: No results for input(s): HGBA1C in the last 72 hours. CBG: No results for input(s): GLUCAP in the last 168 hours. Lipid Profile: No results for input(s): CHOL, HDL, LDLCALC, TRIG, CHOLHDL, LDLDIRECT in the last 72 hours. Thyroid Function Tests:  Recent Labs  12/19/15 1545  TSH 0.444   Anemia Panel: No results for input(s): VITAMINB12, FOLATE, FERRITIN, TIBC, IRON, RETICCTPCT in the last 72 hours. Sepsis Labs: No results for input(s): PROCALCITON, LATICACIDVEN in the last 168 hours.  Recent Results (from the past 240 hour(s))  MRSA PCR Screening     Status: Abnormal   Collection Time: 12/19/15  5:00 PM  Result Value Ref Range Status   MRSA by PCR POSITIVE (A) NEGATIVE Final  Comment:        The GeneXpert MRSA Assay (FDA approved for NASAL specimens only), is one component of a comprehensive MRSA colonization surveillance program. It is not intended to diagnose MRSA infection nor to guide or monitor treatment for MRSA infections. CRITICAL RESULT CALLED TO, READ BACK BY AND VERIFIED WITH: [email or web address] AT 1917 ON 12/19/2015 BY [email or web address]          Radiology Studies: No results found.      Scheduled Meds: . sodium chloride   Intravenous Once  . ALPRAZolam  1 mg Oral TID  . Chlorhexidine Gluconate Cloth  6 each Topical Q0600  . montelukast  10 mg Oral QHS  . mupirocin ointment  1 application Nasal BID  . pantoprazole  80 mg Oral Q1200   Continuous Infusions: . 0.9 % NaCl with KCl 20 mEq / L 125 mL/hr at 12/20/15 0822     LOS: 1 day    Time spent: 66 minutes    Rexene Alberts, MD [facility] Pager [phone]  If 7PM-7AM, please contact night-coverage [email or web address] Password [email or web address] 12/20/2015, 10:41 AM

## 2015-12-20 NOTE — Progress Notes (Signed)
Subjective:  Last episode of BM at 5pm yesterday. Feeling much better.   Objective: Vital signs in last 24 hours: Temp:  [98 F (36.7 C)-98.3 F (36.8 C)] 98.3 F (36.8 C) (10/15 0733) Pulse Rate:  [28-111] 80 (10/15 0600) Resp:  [12-30] 20 (10/15 0733) BP: (71-143)/(47-123) 88/63 (10/15 0600) SpO2:  [84 %-100 %] 100 % (10/15 0600) Weight:  [209 lb (94.8 kg)-226 lb 6.6 oz (102.7 kg)] 226 lb 6.6 oz (102.7 kg) (10/14 1800) Last BM Date: 12/19/15 General:   Alert,  Well-developed, well-nourished, pleasant and cooperative in NAD Head:  Normocephalic and atraumatic. Eyes:  Sclera clear, no icterus.  Abdomen:  Soft, nontender and nondistended.  Normal bowel sounds, without guarding, and without rebound.   Extremities:  Without clubbing, deformity or edema. Neurologic:  Alert and  oriented x4;  grossly normal neurologically. Skin:  Intact without significant lesions or rashes. Psych:  Alert and cooperative. Normal mood and affect.  Intake/Output from previous day: 10/14 0701 - 10/15 0700 In: 3340 [I.V.:1500; Blood:840; IV Piggyback:1000] Out: 500 [Urine:500] Intake/Output this shift: No intake/output data recorded.  Lab Results: CBC  Recent Labs  12/19/15 1149 12/19/15 1743 12/20/15 0428  WBC 10.9*  --  6.9  HGB 12.4 10.1* 10.4*  HCT 36.9 30.7* 31.4*  MCV 90.4  --  89.2  PLT 303  --  214   BMET  Recent Labs  12/19/15 1149 12/20/15 0428  NA 137 141  K 3.5 3.7  CL 104 115*  CO2 26 24  GLUCOSE 102* 100*  BUN 13 9  CREATININE 0.79 0.60  CALCIUM 9.1 7.7*   LFTs  Recent Labs  12/19/15 1149 12/20/15 0428  BILITOT 0.5 1.0  ALKPHOS 69 47  AST 14* 13*  ALT 15 11*  PROT 6.9 5.1*  ALBUMIN 3.8 2.8*   No results for input(s): LIPASE in the last 72 hours. PT/INR  Recent Labs  12/19/15 1545  LABPROT 12.8  INR 0.96      Imaging Studies: No results found.[2 weeks]   Assessment: 55 y/o female with acute onset painless large volume hematochezia, suspected  diverticular bleeding. She has been transfused 2 units of prbcs and Hgb went from 10.1 to 10.4. Hgb over 12 at times of admission. Last episode of bleeding at 5pm yesterday. BPs have remained soft but stable. Patient with normal heart rate.    Plan: 1. Continue to monitor closely for bleeding.  2. Clear liquid diet. No plans for colonoscopy at this time unless patient rebleeds.   We would like to thank you for the opportunity to participate in the care of Jenna Woods.  Laureen Ochs. Bernarda Caffey [facility] [phone] 10/15/20178:05 AM     LOS: 1 day

## 2015-12-21 LAB — TYPE AND SCREEN
ABO/RH(D): AB POS
ANTIBODY SCREEN: NEGATIVE
Unit division: 0
Unit division: 0
Unit division: 0

## 2015-12-21 LAB — CBC
HCT: 34.8 % — ABNORMAL LOW (ref 36.0–46.0)
Hemoglobin: 11.2 g/dL — ABNORMAL LOW (ref 12.0–15.0)
MCH: 29 pg (ref 26.0–34.0)
MCHC: 32.2 g/dL (ref 30.0–36.0)
MCV: 90.2 fL (ref 78.0–100.0)
PLATELETS: 237 10*3/uL (ref 150–400)
RBC: 3.86 MIL/uL — ABNORMAL LOW (ref 3.87–5.11)
RDW: 15.4 % (ref 11.5–15.5)
WBC: 7.5 10*3/uL (ref 4.0–10.5)

## 2015-12-21 LAB — GLUCOSE, CAPILLARY
GLUCOSE-CAPILLARY: 56 mg/dL — AB (ref 65–99)
GLUCOSE-CAPILLARY: 87 mg/dL (ref 65–99)

## 2015-12-21 LAB — HEMOGLOBIN A1C
Hgb A1c MFr Bld: 5.9 % — ABNORMAL HIGH (ref 4.8–5.6)
MEAN PLASMA GLUCOSE: 123 mg/dL

## 2015-12-21 NOTE — Progress Notes (Signed)
    Subjective: Noted she had a BM this morning with traces of blood in it and noted with wiping. Was dark. No abdominal pain.   Objective: Vital signs in last 24 hours: Temp:  [97.3 F (36.3 C)-98.4 F (36.9 C)] 97.3 F (36.3 C) (10/16 0716) Pulse Rate:  [86-93] 86 (10/16 0716) Resp:  [17-22] 17 (10/16 0716) BP: (105-118)/(80-87) 111/80 (10/16 0716) SpO2:  [93 %-100 %] 100 % (10/16 0716) Weight:  [206 lb 12.7 oz (93.8 kg)] 206 lb 12.7 oz (93.8 kg) (10/16 0716) Last BM Date: 12/21/15 General:   Alert and oriented, pleasant Head:  Normocephalic and atraumatic. Eyes:  No icterus, sclera clear. Conjuctiva pink.  Abdomen:  Bowel sounds present, soft, non-tender, non-distended.  Extremities:  Without edema. Neurologic:  Alert and  oriented x4 Psych:  Alert and cooperative. Normal mood and affect.  Intake/Output from previous day: 10/15 0701 - 10/16 0700 In: 2040 [P.O.:1040; I.V.:1000] Out: 450 [Urine:450] Intake/Output this shift: No intake/output data recorded.  Lab Results:  Recent Labs  12/19/15 1149 12/19/15 1743 12/20/15 0428 12/21/15 0713  WBC 10.9*  --  6.9 7.5  HGB 12.4 10.1* 10.4* 11.2*  HCT 36.9 30.7* 31.4* 34.8*  PLT 303  --  214 237   BMET  Recent Labs  12/19/15 1149 12/20/15 0428  NA 137 141  K 3.5 3.7  CL 104 115*  CO2 26 24  GLUCOSE 102* 100*  BUN 13 9  CREATININE 0.79 0.60  CALCIUM 9.1 7.7*   LFT  Recent Labs  12/19/15 1149 12/20/15 0428  PROT 6.9 5.1*  ALBUMIN 3.8 2.8*  AST 14* 13*  ALT 15 11*  ALKPHOS 69 47  BILITOT 0.5 1.0   PT/INR  Recent Labs  12/19/15 1545  LABPROT 12.8  INR 0.96    Assessment: 55 year old female admitted with likely diverticular bleed, receiving 2 units PRBCs this admission. Hgb improved. Small amount of old blood today likely residual from prior bleed;  Hgb up 1 gram from yesterday. No concern for acute, recurrent bleeding at this time. Continue to monitor. Advance to low residue diet.      Plan: Low residue diet Continue to monitor Dr. Laural Golden to resume care tomorrow   Jenna Woods, ANP-BC [facility]    LOS: 2 days    12/21/2015, 9:16 AM

## 2015-12-21 NOTE — Progress Notes (Signed)
PROGRESS NOTE    Jenna Woods  [id number] DOB: 08/31/1960 DOA: 12/19/2015 PCP: [name], MD  Gastroenterologist: Dr. Laural Golden  Brief Narrative:  Patient is a 55 y.o. PMH significant for pan diverticulosis, diagnosed 08/28/2015 per screening colonoscopy, depression with anxiety, prediabetes, hypertension, SLE, and scleroderma who presented to the ED with a chief complaint of multiple large volume bloody stools. She denied any significant abdominal discomfort. She reported no chronic NSAID use with exception of 81 mg aspirin daily. Also, she was recently treated with a steroid Dosepak for hives. She was admitted for further evaluation and management of GI bleeding.   Assessment & Plan:   Principal Problem:   Acute lower GI bleeding Active Problems:   Diverticulosis of colon   Acute blood loss anemia   Hypotension   Morbid obesity (HCC)   Hypertension   GERD (gastroesophageal reflux disease)   Systemic lupus erythematosus (HCC)   IGT (impaired glucose tolerance)   Depression with anxiety   1. Large-volume lower GI bleeding, likely from diverticular bleeding causing acute blood loss anemia. Patient's recent colonoscopy in 08/2005 revealed pandiverticulosis. It is highly suspected that this is the source of her GI bleeding. She received a liter of normal saline in the ED, but her blood pressure fell to the 80s, necessitating a change to the stepdown unit. She was bolused another liter of normal saline and transfused 2 units of packed red blood cells for hypovolemic hypotension. She was continued on PPI therapy. -Her hemoglobin was 12.4 on admission. Following 2 units of packed red blood cells, it was 10.1. -GI was consulted and had anticipated a colonoscopy, depending on further bleeding and her hemoglobin. Per GI, will continue to monitor her closely and continue current management. Her primary GI physician, Dr. Laural Golden will assume care tomorrow. -Patient had another large  volume bloody bowel movement this morning. Her hemoglobin has remained stable compared to yesterday. -We'll continue IV fluid hydration. We'll continue to monitor her CBC.  Hypotension secondary to hypovolemia. Amlodipine was withheld on admission. The patient's blood pressure fell to the 80s following admission. She was bolused another liter of normal saline and transfused 2 units of packed red blood cells. She was then started on maintenance IV fluids. -Her blood pressure has improved. Will continue current treatment.  Prediabetes/glucose intolerance. Patient is treated chronically with metformin. It was held on admission. -We'll continue to monitor her CBG daily each morning. If it increases above 150 consistently, then we'll start sliding scale NovoLog. Her hemoglobin A1c was 5.7 in September 2017.  Scleroderma/SLE. She reports not being on any pharmacological agent.  Chronic depression with anxiety.  Patient is treated chronically with Xanax. It was continued.    DVT prophylaxis: Code Status: Full code Family Communication: Family not available. ( The patient prefers that the medical team not discuss her medical issues with her family.) Disposition Plan: Discharged to home in clinically appropriate   Consultants:   Gastroenterology  Procedures:   Packed red blood cell transfusions 2 on 12/19/15.  Antimicrobials:   None    Subjective: Patient is tearful as she had another large volume bloody bowel movement this morning. She denies abdominal pain, but her abdomen was "bubbling".  Objective: Vitals:   12/20/15 1355 12/20/15 1500 12/20/15 2300 12/21/15 0716  BP: 105/87 118/80 108/82 111/80  Pulse:  93 90 86  Resp:  (!) 22 20 17   Temp:  98.4 F (36.9 C) 98.1 F (36.7 C) 97.3 F (36.3 C)  TempSrc:   Oral  Oral  SpO2:  93% 96% 100%  Weight:    93.8 kg (206 lb 12.7 oz)  Height:      Blood pressure in the room 110/65.   Intake/Output Summary (Last 24 hours) at  12/21/15 1446 Last data filed at 12/21/15 1200  Gross per 24 hour  Intake             1960 ml  Output             1150 ml  Net              810 ml   Filed Weights   12/19/15 1116 12/19/15 1800 12/21/15 0716  Weight: 94.8 kg (209 lb) 102.7 kg (226 lb 6.6 oz) 93.8 kg (206 lb 12.7 oz)    Examination:  General exam: Appears calm and comfortable  Respiratory system: Clear to auscultation. Respiratory effort normal. Cardiovascular system: S1 & S2 heard, RRR. No JVD, murmurs, rubs, gallops or clicks. No pedal edema. Gastrointestinal system: Abdomen is nondistended, soft and nontender. No organomegaly or masses felt. Normal bowel sounds heard. Central nervous system: Alert and oriented. No focal neurological deficits. Extremities: Symmetric 5 x 5 power. Skin: No rashes, lesions or ulcers Psychiatry: Judgement and insight appear normal. Mood is tearful.     Data Reviewed: I have personally reviewed following labs and imaging studies  CBC:  Recent Labs Lab 12/19/15 1149 12/19/15 1743 12/20/15 0428 12/21/15 0713  WBC 10.9*  --  6.9 7.5  NEUTROABS 8.0*  --   --   --   HGB 12.4 10.1* 10.4* 11.2*  HCT 36.9 30.7* 31.4* 34.8*  MCV 90.4  --  89.2 90.2  PLT 303  --  214 [id number]   Basic Metabolic Panel:  Recent Labs Lab 12/19/15 1149 12/20/15 0428  NA 137 141  K 3.5 3.7  CL 104 115*  CO2 26 24  GLUCOSE 102* 100*  BUN 13 9  CREATININE 0.79 0.60  CALCIUM 9.1 7.7*   GFR: Estimated Creatinine Clearance: 84.5 mL/min (by C-G formula based on SCr of 0.6 mg/dL). Liver Function Tests:  Recent Labs Lab 12/19/15 1149 12/20/15 0428  AST 14* 13*  ALT 15 11*  ALKPHOS 69 47  BILITOT 0.5 1.0  PROT 6.9 5.1*  ALBUMIN 3.8 2.8*   No results for input(s): LIPASE, AMYLASE in the last 168 hours. No results for input(s): AMMONIA in the last 168 hours. Coagulation Profile:  Recent Labs Lab 12/19/15 1545  INR 0.96   Cardiac Enzymes: No results for input(s): CKTOTAL, CKMB, CKMBINDEX,  TROPONINI in the last 168 hours. BNP (last 3 results) No results for input(s): PROBNP in the last 8760 hours. HbA1C:  Recent Labs  12/19/15 1545  HGBA1C 5.9*   CBG:  Recent Labs Lab 12/21/15 0742 12/21/15 0822  GLUCAP 56* 87   Lipid Profile: No results for input(s): CHOL, HDL, LDLCALC, TRIG, CHOLHDL, LDLDIRECT in the last 72 hours. Thyroid Function Tests:  Recent Labs  12/19/15 1545  TSH 0.444   Anemia Panel: No results for input(s): VITAMINB12, FOLATE, FERRITIN, TIBC, IRON, RETICCTPCT in the last 72 hours. Sepsis Labs: No results for input(s): PROCALCITON, LATICACIDVEN in the last 168 hours.  Recent Results (from the past 240 hour(s))  MRSA PCR Screening     Status: Abnormal   Collection Time: 12/19/15  5:00 PM  Result Value Ref Range Status   MRSA by PCR POSITIVE (A) NEGATIVE Final    Comment:        The GeneXpert MRSA  Assay (FDA approved for NASAL specimens only), is one component of a comprehensive MRSA colonization surveillance program. It is not intended to diagnose MRSA infection nor to guide or monitor treatment for MRSA infections. CRITICAL RESULT CALLED TO, READ BACK BY AND VERIFIED WITH: [email or web address] AT 1917 ON 12/19/2015 BY [email or web address]          Radiology Studies: No results found.      Scheduled Meds: . sodium chloride   Intravenous Once  . ALPRAZolam  1 mg Oral TID  . Chlorhexidine Gluconate Cloth  6 each Topical Q0600  . fesoterodine  8 mg Oral Daily  . montelukast  10 mg Oral QHS  . mupirocin ointment  1 application Nasal BID  . pantoprazole  80 mg Oral Q1200   Continuous Infusions: . 0.9 % NaCl with KCl 20 mEq / L 125 mL/hr at 12/21/15 0013     LOS: 2 days    Time spent: 63 minutes    Rexene Alberts, MD [facility] Pager [phone]  If 7PM-7AM, please contact night-coverage [email or web address] Password [email or web address] 12/21/2015, 2:46 PM

## 2015-12-21 NOTE — Care Management Note (Signed)
Case Management Note  Patient Details  Name: Jenna Woods MRN: [id number] Date of Birth: 06-16-1960  Subjective/Objective:  Patient adm from home alone with GI bleed. She is independent with ADL's. She has a PCP, transportation and no issues affording medications. She has had [facility] RN services in the past but does not feel she needs them at this time.                Action/Plan: Anticipate DC home with self care. No CM needs known.    Expected Discharge Date:      12/22/2015            Expected Discharge Plan:  Home/Self Care  In-House Referral:  NA  Discharge planning Services  CM Consult  Post Acute Care Choice:  NA Choice offered to:  NA  DME Arranged:    DME Agency:     HH Arranged:    HH Agency:     Status of Service:  Completed, signed off  If discussed at [employer] of Stay Meetings, dates discussed:    Additional Comments:  Fremon Zacharia, Chauncey Reading, RN 12/21/2015, 1:18 PM

## 2015-12-22 ENCOUNTER — Encounter (HOSPITAL_COMMUNITY): Payer: Self-pay | Admitting: Internal Medicine

## 2015-12-22 DIAGNOSIS — K5731 Diverticulosis of large intestine without perforation or abscess with bleeding: Secondary | ICD-10-CM

## 2015-12-22 DIAGNOSIS — K922 Gastrointestinal hemorrhage, unspecified: Secondary | ICD-10-CM

## 2015-12-22 DIAGNOSIS — K219 Gastro-esophageal reflux disease without esophagitis: Secondary | ICD-10-CM

## 2015-12-22 DIAGNOSIS — D62 Acute posthemorrhagic anemia: Secondary | ICD-10-CM

## 2015-12-22 LAB — BASIC METABOLIC PANEL
BUN: 9 mg/dL (ref 6–20)
CALCIUM: 8.2 mg/dL — AB (ref 8.9–10.3)
CO2: 25 mmol/L (ref 22–32)
CREATININE: 0.69 mg/dL (ref 0.44–1.00)
Chloride: 113 mmol/L — ABNORMAL HIGH (ref 101–111)
GFR calc non Af Amer: >60 mL/min (ref >60)
Glucose, Bld: 103 mg/dL — ABNORMAL HIGH (ref 65–99)
Potassium: 4.2 mmol/L (ref 3.5–5.1)
SODIUM: 140 mmol/L (ref 135–145)

## 2015-12-22 LAB — CBC
HCT: 30.2 % — ABNORMAL LOW (ref 36.0–46.0)
HEMOGLOBIN: 9.9 g/dL — AB (ref 12.0–15.0)
MCH: 29.6 pg (ref 26.0–34.0)
MCHC: 32.8 g/dL (ref 30.0–36.0)
MCV: 90.1 fL (ref 78.0–100.0)
PLATELETS: 243 10*3/uL (ref 150–400)
RBC: 3.35 MIL/uL — AB (ref 3.87–5.11)
RDW: 14.7 % (ref 11.5–15.5)
WBC: 8 10*3/uL (ref 4.0–10.5)

## 2015-12-22 LAB — GLUCOSE, CAPILLARY: GLUCOSE-CAPILLARY: 90 mg/dL (ref 65–99)

## 2015-12-22 NOTE — Progress Notes (Addendum)
PROGRESS NOTE    Jenna Woods  [id number] DOB: Jul 20, 1960 DOA: 12/19/2015 PCP: [name], MD  Gastroenterologist: Dr. Laural Golden  Brief Narrative:  Patient is a 55 y.o. PMH significant for pan diverticulosis, diagnosed 08/28/2015 per screening colonoscopy, depression with anxiety, prediabetes, hypertension, SLE, and scleroderma who presented to the ED with a chief complaint of multiple large volume bloody stools. She denied any significant abdominal discomfort. She reported no chronic NSAID use with exception of 81 mg aspirin daily. Also, she was recently treated with a steroid Dosepak for hives. She was admitted for further evaluation and management of GI bleeding. GI was consulted and recommended conservative treatment. Colonoscopy was considered, but not done. The patient will be reevaluated by her primary gastroenterologist Dr. Laural Golden. Would favor keeping the patient in the hospital another 24 hours to monitor for further rectal bleeding.   Assessment & Plan:   Principal Problem:   Acute lower GI bleeding Active Problems:   Diverticulosis of colon with hemorrhage   Acute blood loss anemia   Hypotension   Morbid obesity (HCC)   Hypertension   GERD (gastroesophageal reflux disease)   Systemic lupus erythematosus (HCC)   IGT (impaired glucose tolerance)   Depression with anxiety   1. Large-volume lower GI bleeding, likely from diverticular bleeding causing acute blood loss anemia. Patient's recent colonoscopy in 08/2005 revealed pandiverticulosis. She received a liter of normal saline in the ED, but her blood pressure fell to the 80s, necessitating a change to the stepdown unit. She was bolused another liter of normal saline and transfused 2 units of packed red blood cells for hypovolemic hypotension. She was continued on PPI therapy. -Her hemoglobin was 12.4 on admission. Following 2 units of packed red blood cells, it was 10.1>>>11.2>>9.9. -GI was consulted and had  anticipated a colonoscopy, depending on further bleeding and her hemoglobin. The likely source of her lower GI bleeding is diverticular bleeding. Per GI, will continue to monitor her closely and continue current management. Her primary GI physician, Dr. Laural Golden will assume care today. In the meantime, her diet was advanced to a soft diet which she is tolerating. -Patient has had large volume bloody bowel movements each day. She has not had one since yesterday morning, but I would favor keeping her in the hospital another 24 hours to confirm resolution. -Her hemoglobin has fallen since yesterday. We'll continue to monitor. We'll hold on further transfusions for now.  Hypotension secondary to hypovolemia. Amlodipine was withheld on admission. The patient's blood pressure fell to the 80s following admission. She was bolused another liter of normal saline and transfused 2 units of packed red blood cells. She was then started on maintenance IV fluids. -Her blood pressure has improved. Will continue current treatment.  Prediabetes/glucose intolerance. Patient is treated chronically with metformin. It was held on admission. -We'll continue to monitor her CBG daily each morning. They have been generally less than 150. If they increase above 150 consistently, then we'll start sliding scale NovoLog. Her hemoglobin A1c was 5.7 in September 2017.  Scleroderma/SLE. She reports not being on any pharmacological agent.  Chronic depression with anxiety.  Patient is treated chronically with Xanax. It was continued.    DVT prophylaxis: Code Status: Full code Family Communication: Family not available. ( The patient prefers that the medical team not discuss her medical issues with her family.) Disposition Plan: Discharged to home in clinically appropriate, possibly in the next 24 hours.   Consultants:   Gastroenterology  Procedures:   Packed red  blood cell transfusions 2 on 12/19/15.  Antimicrobials:    None    Subjective: Patient denies any further bloody bowel movements since yesterday morning. She is afraid to go home due to the possibility that it can recur. She is starting to tolerate her soft diet without abdominal pain, nausea, or vomiting.  Objective: Vitals:   12/21/15 1450 12/21/15 2100 12/21/15 2126 12/22/15 0425  BP: 110/77 91/63 100/70 104/69  Pulse: 88 81  78  Resp: 18 18  18   Temp: 98 F (36.7 C) 97.4 F (36.3 C)  97 F (36.1 C)  TempSrc:  Oral  Oral  SpO2: 94% 100%  100%  Weight:    105.9 kg (233 lb 9 oz)  Height:      Blood pressure in the room 110/65.   Intake/Output Summary (Last 24 hours) at 12/22/15 1208 Last data filed at 12/22/15 0649  Gross per 24 hour  Intake          4643.33 ml  Output             2200 ml  Net          2443.33 ml   Filed Weights   12/19/15 1800 12/21/15 0716 12/22/15 0425  Weight: 102.7 kg (226 lb 6.6 oz) 93.8 kg (206 lb 12.7 oz) 105.9 kg (233 lb 9 oz)    Examination:  General exam: Appears calm and comfortable  Respiratory system: Clear to auscultation. Respiratory effort normal. Cardiovascular system: S1 & S2 heard, RRR. No JVD, murmurs, rubs, gallops or clicks. No pedal edema. Gastrointestinal system: Abdomen is nondistended, soft and nontender. No organomegaly or masses felt. Normal bowel sounds heard. Central nervous system: Alert and oriented. No focal neurological deficits. Extremities: Symmetric 5 x 5 power. Skin: No rashes, lesions or ulcers Psychiatry: Judgement and insight appear normal. Mood Is better and she is not tearful.     Data Reviewed: I have personally reviewed following labs and imaging studies  CBC:  Recent Labs Lab 12/19/15 1149 12/19/15 1743 12/20/15 0428 12/21/15 0713 12/22/15 0649  WBC 10.9*  --  6.9 7.5 8.0  NEUTROABS 8.0*  --   --   --   --   HGB 12.4 10.1* 10.4* 11.2* 9.9*  HCT 36.9 30.7* 31.4* 34.8* 30.2*  MCV 90.4  --  89.2 90.2 90.1  PLT 303  --  214 237 0000000   Basic  Metabolic Panel:  Recent Labs Lab 12/19/15 1149 12/20/15 0428 12/22/15 0649  NA 137 141 140  K 3.5 3.7 4.2  CL 104 115* 113*  CO2 26 24 25   GLUCOSE 102* 100* 103*  BUN 13 9 9   CREATININE 0.79 0.60 0.69  CALCIUM 9.1 7.7* 8.2*   GFR: Estimated Creatinine Clearance: 90.4 mL/min (by C-G formula based on SCr of 0.69 mg/dL). Liver Function Tests:  Recent Labs Lab 12/19/15 1149 12/20/15 0428  AST 14* 13*  ALT 15 11*  ALKPHOS 69 47  BILITOT 0.5 1.0  PROT 6.9 5.1*  ALBUMIN 3.8 2.8*   No results for input(s): LIPASE, AMYLASE in the last 168 hours. No results for input(s): AMMONIA in the last 168 hours. Coagulation Profile:  Recent Labs Lab 12/19/15 1545  INR 0.96   Cardiac Enzymes: No results for input(s): CKTOTAL, CKMB, CKMBINDEX, TROPONINI in the last 168 hours. BNP (last 3 results) No results for input(s): PROBNP in the last 8760 hours. HbA1C:  Recent Labs  12/19/15 1545  HGBA1C 5.9*   CBG:  Recent Labs Lab 12/21/15 0742  12/21/15 0822 12/22/15 0612  GLUCAP 56* 87 90   Lipid Profile: No results for input(s): CHOL, HDL, LDLCALC, TRIG, CHOLHDL, LDLDIRECT in the last 72 hours. Thyroid Function Tests:  Recent Labs  12/19/15 1545  TSH 0.444   Anemia Panel: No results for input(s): VITAMINB12, FOLATE, FERRITIN, TIBC, IRON, RETICCTPCT in the last 72 hours. Sepsis Labs: No results for input(s): PROCALCITON, LATICACIDVEN in the last 168 hours.  Recent Results (from the past 240 hour(s))  MRSA PCR Screening     Status: Abnormal   Collection Time: 12/19/15  5:00 PM  Result Value Ref Range Status   MRSA by PCR POSITIVE (A) NEGATIVE Final    Comment:        The GeneXpert MRSA Assay (FDA approved for NASAL specimens only), is one component of a comprehensive MRSA colonization surveillance program. It is not intended to diagnose MRSA infection nor to guide or monitor treatment for MRSA infections. CRITICAL RESULT CALLED TO, READ BACK BY AND VERIFIED  WITH: [email or web address] AT 1917 ON 12/19/2015 BY [email or web address]          Radiology Studies: No results found.      Scheduled Meds: . sodium chloride   Intravenous Once  . ALPRAZolam  1 mg Oral TID  . Chlorhexidine Gluconate Cloth  6 each Topical Q0600  . fesoterodine  8 mg Oral Daily  . montelukast  10 mg Oral QHS  . mupirocin ointment  1 application Nasal BID  . pantoprazole  80 mg Oral Q1200   Continuous Infusions: . 0.9 % NaCl with KCl 20 mEq / L 125 mL/hr at 12/22/15 1159     LOS: 3 days    Time spent: 25 minutes    Rexene Alberts, MD [facility] Pager [phone]  If 7PM-7AM, please contact night-coverage [email or web address] Password [email or web address] 12/22/2015, 12:08 PM

## 2015-12-22 NOTE — Progress Notes (Signed)
  Subjective:  Patient has no complaints. She denies abdominal pain nausea vomiting. She ate 100% of her evening meal. She had a bowel movement this morning when she noted scant amount of old blood. She had another bowel movement few minutes ago and no blood noted. Was documented by her nurse.   Objective: Blood pressure 108/75, pulse 99, temperature 98.3 F (36.8 C), temperature source Oral, resp. rate 19, height 5\' 3"  (1.6 m), weight 233 lb 9 oz (105.9 kg), SpO2 100 %. Patient is alert and in no acute distress. Abdomen is full but soft and nontender without organomegaly or masses. No LE edema or clubbing noted.  Labs/studies Results:   Recent Labs  12/20/15 0428 12/21/15 0713 12/22/15 0649  WBC 6.9 7.5 8.0  HGB 10.4* 11.2* 9.9*  HCT 31.4* 34.8* 30.2*  PLT 214 237 243    BMET   Recent Labs  12/20/15 0428 12/22/15 0649  NA 141 140  K 3.7 4.2  CL 115* 113*  CO2 24 25  GLUCOSE 100* 103*  BUN 9 9  CREATININE 0.60 0.69  CALCIUM 7.7* 8.2*    LFT   Recent Labs  12/20/15 0428  PROT 5.1*  ALBUMIN 2.8*  AST 13*  ALT 11*  ALKPHOS 47  BILITOT 1.0     Assessment:  #1.  LGI bleed felt to be secondary to colonic diverticulosis. She was found to have pancolonic diverticulosis on colonoscopy of 08/28/2015 performed for screening purposes. It appears she has stopped bleeding. If bleeding recurs will proceed with GI bleeding scan otherwise she should be able to go home tomorrow. #2. Anemia secondary to LGI Bleed. She has received 2 units of PRBCs.  Recommendations:  No further workup unless bleeding was to recur. IV fluid rate decreased to 50 mL per hour. CBC in a.m.

## 2015-12-23 DIAGNOSIS — K219 Gastro-esophageal reflux disease without esophagitis: Secondary | ICD-10-CM

## 2015-12-23 DIAGNOSIS — I10 Essential (primary) hypertension: Secondary | ICD-10-CM

## 2015-12-23 DIAGNOSIS — K5731 Diverticulosis of large intestine without perforation or abscess with bleeding: Principal | ICD-10-CM

## 2015-12-23 LAB — BASIC METABOLIC PANEL
ANION GAP: 3 — AB (ref 5–15)
BUN: 8 mg/dL (ref 6–20)
CHLORIDE: 110 mmol/L (ref 101–111)
CO2: 28 mmol/L (ref 22–32)
Calcium: 8.4 mg/dL — ABNORMAL LOW (ref 8.9–10.3)
Creatinine, Ser: 0.58 mg/dL (ref 0.44–1.00)
GFR calc non Af Amer: >60 mL/min (ref >60)
Glucose, Bld: 100 mg/dL — ABNORMAL HIGH (ref 65–99)
POTASSIUM: 3.8 mmol/L (ref 3.5–5.1)
SODIUM: 141 mmol/L (ref 135–145)

## 2015-12-23 LAB — CBC
HCT: 31.6 % — ABNORMAL LOW (ref 36.0–46.0)
HEMOGLOBIN: 10.3 g/dL — AB (ref 12.0–15.0)
MCH: 29.9 pg (ref 26.0–34.0)
MCHC: 32.6 g/dL (ref 30.0–36.0)
MCV: 91.9 fL (ref 78.0–100.0)
Platelets: 281 10*3/uL (ref 150–400)
RBC: 3.44 MIL/uL — AB (ref 3.87–5.11)
RDW: 14.4 % (ref 11.5–15.5)
WBC: 6.9 10*3/uL (ref 4.0–10.5)

## 2015-12-23 LAB — GLUCOSE, CAPILLARY: GLUCOSE-CAPILLARY: 109 mg/dL — AB (ref 65–99)

## 2015-12-23 MED ORDER — ASPIRIN EC 81 MG PO TBEC: 81.0000 mg | DELAYED_RELEASE_TABLET | Freq: Every day | ORAL | Status: DC

## 2015-12-23 NOTE — Discharge Summary (Signed)
Physician Discharge Summary  Jenna Woods [id number] DOB: 07-29-1960 DOA: 12/19/2015  PCP: [name], MD  Admit date: 12/19/2015 Discharge date: 12/23/2015  Admitted From: Home  Disposition:  Home   Recommendations for Outpatient Follow-up:  1. Follow up with PCP in 1-2 weeks 2. Please obtain BMP/CBC in one week  Home Health: No  Equipment/Devices: None   Discharge Condition:Stable  CODE STATUS: Full  Diet recommendation: Regular   Brief/Interim Summary: 75 yof with a hx of pan diverticulosis, depression with anxiety, prediabetes, hypertension, SLE, and scleroderma who presented with complaints of multiple bloody stools. She was recently treated with a steroid Dosepak for hives.GI was consulted and recommended conservative treatment in which a colonoscopy has been considered. She was admitted for further management of GI bleeding.   Discharge Diagnoses:  Principal Problem:   Acute lower GI bleeding Active Problems:   Morbid obesity ([place])   Hypertension   GERD (gastroesophageal reflux disease)   Systemic lupus erythematosus ([facility])   IGT (impaired glucose tolerance)   Depression with anxiety   Diverticulosis of colon with hemorrhage   Acute blood loss anemia   Hypotension  1. Lower GI bleed, likely from diverticular bleeding. Patient's recent colonoscopy in 08/2015 revealed pandiverticulosis.She was transfused 2 units of packed red blood cells. GI was consulted. Bleeding was self limited and resolved spontaneously. She has not had further bleeding and hemoglobin has been stable. She has been cleared for discharge from GI standpoint. Resume aspirin in 1 week 2. Acute blood loss anemia. Hemoglobin 10.3 at this time. She does have a GI bleed in which she has been transfused 2 units of packed red blood cells  since admission.   3. Hypovolemic hypotension. Blood pressures have improved with volume resuscitation  4. Scleroderma/ SLE. She states she is not on any  pharmacological agent.  5. Chronic depression and anxiety. Continue xanax.   Discharge Instructions  Discharge Instructions    Diet - low sodium heart healthy    Complete by:  As directed    Increase activity slowly    Complete by:  As directed        Medication List    TAKE these medications   ALPRAZolam 1 MG tablet Commonly known as:  XANAX Take 1 tablet (1 mg total) by mouth 3 (three) times daily.   amLODipine 10 MG tablet Commonly known as:  NORVASC TAKE ONE (1) TABLET EACH DAY What changed:  how much to take  how to take this  when to take this  additional instructions   aspirin EC 81 MG tablet Take 1 tablet (81 mg total) by mouth daily. Resume in 1 week What changed:  additional instructions   CALCIUM 1200 1200-1000 MG-UNIT Chew Chew 1 tablet by mouth daily.   fesoterodine 8 MG Tb24 tablet Commonly known as:  TOVIAZ Take 1 tablet (8 mg total) by mouth daily.   metFORMIN 500 MG tablet Commonly known as:  GLUCOPHAGE Take 1 tablet (500 mg total) by mouth daily with breakfast.   montelukast 10 MG tablet Commonly known as:  SINGULAIR Take 1 tablet (10 mg total) by mouth at bedtime.   NEXIUM 40 MG capsule Generic drug:  esomeprazole TAKE ONE (1) CAPSULE EACH DAY BEFORE BREAKFAST   traZODone 100 MG tablet Commonly known as:  DESYREL Take 1 tablet (100 mg total) by mouth at bedtime as needed for sleep.       Allergies  Allergen Reactions  . Bee Venom Anaphylaxis  . Sesame Oil Anaphylaxis and  Swelling    (Sesame seed)  . Molds & Smuts   . Codeine Itching and Rash    Consultations:  Gastroenterology    Procedures/Studies: No results found.   Subjective: No further bleeding. No abdominal pain  Discharge Exam: Vitals:   12/22/15 2200 12/23/15 0656  BP: 124/78 100/65  Pulse: 84 71  Resp: 19 18  Temp: 97.6 F (36.4 C) 97.5 F (36.4 C)   Vitals:   12/22/15 1819 12/22/15 1943 12/22/15 2200 12/23/15 0656  BP: 108/75  124/78 100/65   Pulse: 99  84 71  Resp: 19  19 18   Temp: 98.3 F (36.8 C)  97.6 F (36.4 C) 97.5 F (36.4 C)  TempSrc: Oral  Oral Oral  SpO2: 100% 100% 100% 98%  Weight:    105.9 kg (233 lb 6.4 oz)  Height:        General: Pt is alert, awake, not in acute distress Cardiovascular: RRR, S1/S2 +, no rubs, no gallops Respiratory: CTA bilaterally, no wheezing, no rhonchi Abdominal: Soft, NT, ND, bowel sounds + Extremities: no edema, no cyanosis    The results of significant diagnostics from this hospitalization (including imaging, microbiology, ancillary and laboratory) are listed below for reference.     Microbiology: Recent Results (from the past 240 hour(s))  MRSA PCR Screening     Status: Abnormal   Collection Time: 12/19/15  5:00 PM  Result Value Ref Range Status   MRSA by PCR POSITIVE (A) NEGATIVE Final    Comment:        The GeneXpert MRSA Assay (FDA approved for NASAL specimens only), is one component of a comprehensive MRSA colonization surveillance program. It is not intended to diagnose MRSA infection nor to guide or monitor treatment for MRSA infections. CRITICAL RESULT CALLED TO, READ BACK BY AND VERIFIED WITH: [email or web address] AT 1917 ON 12/19/2015 BY [email or web address].M      Labs: BNP (last 3 results) No results for input(s): BNP in the last 8760 hours. Basic Metabolic Panel:  Recent Labs Lab 12/19/15 1149 12/20/15 0428 12/22/15 0649 12/23/15 0612  NA 137 141 140 141  K 3.5 3.7 4.2 3.8  CL 104 115* 113* 110  CO2 26 24 25 28   GLUCOSE 102* 100* 103* 100*  BUN 13 9 9 8   CREATININE 0.79 0.60 0.69 0.58  CALCIUM 9.1 7.7* 8.2* 8.4*   Liver Function Tests:  Recent Labs Lab 12/19/15 1149 12/20/15 0428  AST 14* 13*  ALT 15 11*  ALKPHOS 69 47  BILITOT 0.5 1.0  PROT 6.9 5.1*  ALBUMIN 3.8 2.8*   No results for input(s): LIPASE, AMYLASE in the last 168 hours. No results for input(s): AMMONIA in the last 168 hours. CBC:  Recent Labs Lab 12/19/15 1149 12/19/15 1743  12/20/15 0428 12/21/15 0713 12/22/15 0649 12/23/15 0612  WBC 10.9*  --  6.9 7.5 8.0 6.9  NEUTROABS 8.0*  --   --   --   --   --   HGB 12.4 10.1* 10.4* 11.2* 9.9* 10.3*  HCT 36.9 30.7* 31.4* 34.8* 30.2* 31.6*  MCV 90.4  --  89.2 90.2 90.1 91.9  PLT 303  --  214 237 243 281   Cardiac Enzymes: No results for input(s): CKTOTAL, CKMB, CKMBINDEX, TROPONINI in the last 168 hours. BNP: Invalid input(s): POCBNP CBG:  Recent Labs Lab 12/21/15 0742 12/21/15 0822 12/22/15 0612 12/23/15 0518  GLUCAP 56* 87 90 109*   D-Dimer No results for input(s): DDIMER in the last 72 hours. Hgb A1c No  results for input(s): HGBA1C in the last 72 hours. Lipid Profile No results for input(s): CHOL, HDL, LDLCALC, TRIG, CHOLHDL, LDLDIRECT in the last 72 hours. Thyroid function studies No results for input(s): TSH, T4TOTAL, T3FREE, THYROIDAB in the last 72 hours.  Invalid input(s): FREET3 Anemia work up No results for input(s): VITAMINB12, FOLATE, FERRITIN, TIBC, IRON, RETICCTPCT in the last 72 hours. Urinalysis No results found for: COLORURINE, APPEARANCEUR, [place], [place], [place], [place], [place], [place], PROTEINUR, UROBILINOGEN, NITRITE, LEUKOCYTESUR Sepsis Labs Invalid input(s): PROCALCITONIN,  WBC,  LACTICIDVEN Microbiology Recent Results (from the past 240 hour(s))  MRSA PCR Screening     Status: Abnormal   Collection Time: 12/19/15  5:00 PM  Result Value Ref Range Status   MRSA by PCR POSITIVE (A) NEGATIVE Final    Comment:        The GeneXpert MRSA Assay (FDA approved for NASAL specimens only), is one component of a comprehensive MRSA colonization surveillance program. It is not intended to diagnose MRSA infection nor to guide or monitor treatment for MRSA infections. CRITICAL RESULT CALLED TO, READ BACK BY AND VERIFIED WITH: [email or web address] AT 1917 ON 12/19/2015 BY [email or web address].M      Time coordinating discharge: Over 30 minutes  SIGNED:   Kathie Dike, MD  [facility] 12/23/2015, 11:29 AM If 7PM-7AM, please contact night-coverage [email or web address] Password [email or web address]

## 2015-12-23 NOTE — Progress Notes (Signed)
Jenna Woods discharged Home per MD order.  Discharge instructions reviewed and discussed with the patient, all questions and concerns answered. Copy of instructions given to patient.    Medication List    TAKE these medications   ALPRAZolam 1 MG tablet Commonly known as:  XANAX Take 1 tablet (1 mg total) by mouth 3 (three) times daily.   amLODipine 10 MG tablet Commonly known as:  NORVASC TAKE ONE (1) TABLET EACH DAY What changed:  how much to take  how to take this  when to take this  additional instructions   aspirin EC 81 MG tablet Take 1 tablet (81 mg total) by mouth daily. Resume in 1 week What changed:  additional instructions   CALCIUM 1200 1200-1000 MG-UNIT Chew Chew 1 tablet by mouth daily.   fesoterodine 8 MG Tb24 tablet Commonly known as:  TOVIAZ Take 1 tablet (8 mg total) by mouth daily.   metFORMIN 500 MG tablet Commonly known as:  GLUCOPHAGE Take 1 tablet (500 mg total) by mouth daily with breakfast.   montelukast 10 MG tablet Commonly known as:  SINGULAIR Take 1 tablet (10 mg total) by mouth at bedtime.   NEXIUM 40 MG capsule Generic drug:  esomeprazole TAKE ONE (1) CAPSULE EACH DAY BEFORE BREAKFAST   traZODone 100 MG tablet Commonly known as:  DESYREL Take 1 tablet (100 mg total) by mouth at bedtime as needed for sleep.       Patients skin is clean, dry and intact, no evidence of skin break down. IV site discontinued and catheter remains intact. Site without signs and symptoms of complications. Dressing and pressure applied.  Patient escorted to car by NT,  no distress noted upon discharge.  Ralene Muskrat [name] 12/23/2015 3:38 PM

## 2015-12-29 ENCOUNTER — Telehealth: Payer: Self-pay

## 2015-12-29 NOTE — Telephone Encounter (Signed)
Patient will follow discharge instructions and keep follow up appointment here.

## 2015-12-31 ENCOUNTER — Ambulatory Visit: Payer: Self-pay | Admitting: Family Medicine

## 2016-01-05 ENCOUNTER — Encounter: Payer: Self-pay | Admitting: Family Medicine

## 2016-01-05 ENCOUNTER — Ambulatory Visit (INDEPENDENT_AMBULATORY_CARE_PROVIDER_SITE_OTHER): Payer: Medicare Other | Admitting: Family Medicine

## 2016-01-05 VITALS — BP 112/64 | HR 100 | Resp 18 | Ht 66.0 in | Wt 229.0 lb

## 2016-01-05 DIAGNOSIS — Z09 Encounter for follow-up examination after completed treatment for conditions other than malignant neoplasm: Secondary | ICD-10-CM | POA: Insufficient documentation

## 2016-01-05 DIAGNOSIS — R1013 Epigastric pain: Secondary | ICD-10-CM | POA: Insufficient documentation

## 2016-01-05 DIAGNOSIS — K922 Gastrointestinal hemorrhage, unspecified: Secondary | ICD-10-CM | POA: Diagnosis not present

## 2016-01-05 DIAGNOSIS — F325 Major depressive disorder, single episode, in full remission: Secondary | ICD-10-CM

## 2016-01-05 DIAGNOSIS — I10 Essential (primary) hypertension: Secondary | ICD-10-CM

## 2016-01-05 DIAGNOSIS — D62 Acute posthemorrhagic anemia: Secondary | ICD-10-CM

## 2016-01-05 LAB — CBC
HEMATOCRIT: 36 % (ref 35.0–45.0)
Hemoglobin: 11.9 g/dL (ref 11.7–15.5)
MCH: 29.7 pg (ref 27.0–33.0)
MCHC: 33.1 g/dL (ref 32.0–36.0)
MCV: 89.8 fL (ref 80.0–100.0)
MPV: 8.8 fL (ref 7.5–12.5)
PLATELETS: 470 10*3/uL — AB (ref 140–400)
RBC: 4.01 MIL/uL (ref 3.80–5.10)
RDW: 14.1 % (ref 11.0–15.0)
WBC: 8.3 10*3/uL (ref 3.8–10.8)

## 2016-01-05 LAB — BASIC METABOLIC PANEL
BUN: 9 mg/dL (ref 7–25)
CALCIUM: 9.3 mg/dL (ref 8.6–10.4)
CO2: 26 mmol/L (ref 20–31)
CREATININE: 0.75 mg/dL (ref 0.50–1.05)
Chloride: 108 mmol/L (ref 98–110)
Glucose, Bld: 85 mg/dL (ref 65–99)
Potassium: 4.1 mmol/L (ref 3.5–5.3)
Sodium: 141 mmol/L (ref 135–146)

## 2016-01-05 NOTE — Assessment & Plan Note (Signed)
Improved, denies further rectal bleeding , weakness or fatigue. Educated pt regarding her disease, and symptoms to be concerned about

## 2016-01-05 NOTE — Patient Instructions (Addendum)
Wellness as before, call if you need me before  CBc and bmp today  You are referred for [place] gall bladder, we will call with appt Please work on good  health habits so that your health will improve. 1. Commitment to daily physical activity for 30 to 60  minutes, if you are able to do this.  2. Commitment to wise food choices. Aim for half of your  food intake to be vegetable and fruit, one quarter starchy foods, and one quarter protein. Try to eat on a regular schedule  3 meals per day, snacking between meals should be limited to vegetables or fruits or small portions of nuts. 64 ounces of water per day is generally recommended, unless you have specific health conditions, like heart failure or kidney failure where you will need to limit fluid intake.  3. Commitment to sufficient and a  good quality of physical and mental rest daily, generally between 6 to 8 hours per day.  WITH PERSISTANCE AND PERSEVERANCE, THE IMPOSSIBLE , BECOMES THE NORM!  Thank you  for choosing [facility]. We consider it a privelige to serve you.  Delivering excellent health care in a caring and  compassionate way is our goal.  Partnering with you,  so that together we can achieve this goal is our strategy.

## 2016-01-05 NOTE — Assessment & Plan Note (Signed)
manbaged by psych and well controlled

## 2016-01-05 NOTE — Assessment & Plan Note (Signed)
rept cbc today

## 2016-01-05 NOTE — Assessment & Plan Note (Signed)
improved Patient re-educated about  the importance of commitment to a  minimum of 150 minutes of exercise per week.  The importance of healthy food choices with portion control discussed. Encouraged to start a food diary, count calories and to consider  joining a support group. Sample diet sheets offered. Goals set by the patient for the next several months.   Weight /BMI 01/05/2016 12/23/2015 12/19/2015  WEIGHT 229 lb 233 lb 6.4 oz -  HEIGHT 5\' 6"  - 5\' 3"   BMI 36.96 kg/m2 - 41.34 kg/m2  Some encounter information is confidential and restricted. Go to Review Flowsheets activity to see all data.

## 2016-01-05 NOTE — Assessment & Plan Note (Signed)
2 to 3 month h/o excess bloating , belching and dyspesia, RUQ location, needs re  imaging for stones

## 2016-01-05 NOTE — Assessment & Plan Note (Signed)
Controlled, no change in medication  

## 2016-01-05 NOTE — Progress Notes (Signed)
   Jenna Woods     MRN: [id number]      DOB: 01/02/1961   HPI [name]  Patient in for follow up of recent hospitalization. Discharge summary, and laboratory and radiology data are reviewed, and any questions or concerns about recent hospitalization are discussed. Specific issues requiring follow up are specifically addressed. C/o excess bloating, belching and RUQ pain, with poor appetite Denies any further rectal blood, feels well  ROS Denies recent fever or chills. Denies sinus pressure, nasal congestion, ear pain or sore throat. Denies chest congestion, productive cough or wheezing. Denies chest pains, palpitations and leg swelling   Denies dysuria, frequency, hesitancy or incontinence. Denies joint pain, swelling and limitation in mobility. Denies headaches, seizures, numbness, or tingling. Denies uncontrolled depression, anxiety or insomnia. Denies skin break down or rash.   PE  BP 112/64   Pulse 100   Resp 18   Ht 5\' 6"  (1.676 m)   Wt 229 lb (103.9 kg)   SpO2 98%   BMI 36.96 kg/m   Patient alert and oriented and in no cardiopulmonary distress.  HEENT: No facial asymmetry, EOMI,   oropharynx pink and moist.  Neck supple no JVD, no mass.  Chest: Clear to auscultation bilaterally.  CVS: S1, S2 no murmurs, no S3.Regular rate.  ABD: Soft non tender.   Ext: No edema  MS: Adequate ROM spine, shoulders, hips and knees.  Skin: Intact, no ulcerations or rash noted.  Psych: Good eye contact, normal affect. Memory intact not anxious or depressed appearing.  CNS: CN 2-12 intact, power,  normal throughout.no focal deficits noted.   Assessment & Plan Dyspepsia 2 to 3 month h/o excess bloating , belching and dyspesia, RUQ location, needs re  imaging for stones  Hospital discharge follow-up Improved, denies further rectal bleeding , weakness or fatigue. Educated pt regarding her disease, and symptoms to be concerned about  Hypertension Controlled, no  change in medication   Acute blood loss anemia rept cbc today  Morbid obesity (Jenna Woods) improved Patient re-educated about  the importance of commitment to a  minimum of 150 minutes of exercise per week.  The importance of healthy food choices with portion control discussed. Encouraged to start a food diary, count calories and to consider  joining a support group. Sample diet sheets offered. Goals set by the patient for the next several months.   Weight /BMI 01/05/2016 12/23/2015 12/19/2015  WEIGHT 229 lb 233 lb 6.4 oz -  HEIGHT 5\' 6"  - 5\' 3"   BMI 36.96 kg/m2 - 41.34 kg/m2  Some encounter information is confidential and restricted. Go to Review Flowsheets activity to see all data.      Major depression manbaged by psych and well controlled

## 2016-01-06 ENCOUNTER — Encounter: Payer: Self-pay | Admitting: Family Medicine

## 2016-01-08 ENCOUNTER — Ambulatory Visit (HOSPITAL_COMMUNITY)
Admission: RE | Admit: 2016-01-08 | Discharge: 2016-01-08 | Disposition: A | Discharge status: Home / Self Care | Payer: Medicare Other | Source: Ambulatory Visit | Attending: Family Medicine | Admitting: Family Medicine

## 2016-01-08 ENCOUNTER — Telehealth: Payer: Self-pay

## 2016-01-08 DIAGNOSIS — R1011 Right upper quadrant pain: Secondary | ICD-10-CM | POA: Diagnosis not present

## 2016-01-08 DIAGNOSIS — R1013 Epigastric pain: Secondary | ICD-10-CM | POA: Insufficient documentation

## 2016-01-08 DIAGNOSIS — R932 Abnormal findings on diagnostic imaging of liver and biliary tract: Secondary | ICD-10-CM

## 2016-01-08 NOTE — Telephone Encounter (Signed)
-----   Message from Fayrene Helper, MD sent at 01/08/2016 10:21 AM EDT ----- pls let her know gall stones not identified, BUT report not totally normal, NOt CANCER, but possible crystals/  Debris , which MAY be contributing to her nausea and bloating, I recommend she see her GI Doc to further review  The study as she is symptomatic, pls refer, Doc  Is Rehman

## 2016-01-14 ENCOUNTER — Other Ambulatory Visit: Payer: Self-pay | Admitting: Family Medicine

## 2016-01-20 ENCOUNTER — Other Ambulatory Visit: Payer: Self-pay | Admitting: Family Medicine

## 2016-01-20 ENCOUNTER — Encounter (INDEPENDENT_AMBULATORY_CARE_PROVIDER_SITE_OTHER): Payer: Self-pay | Admitting: Internal Medicine

## 2016-01-20 ENCOUNTER — Ambulatory Visit (INDEPENDENT_AMBULATORY_CARE_PROVIDER_SITE_OTHER): Payer: Medicare Other | Admitting: Internal Medicine

## 2016-01-20 VITALS — BP 108/80 | HR 80 | Temp 98.4°F | Ht 66.0 in | Wt 230.0 lb

## 2016-01-20 DIAGNOSIS — R1011 Right upper quadrant pain: Secondary | ICD-10-CM

## 2016-01-20 NOTE — Progress Notes (Signed)
Subjective:    Patient ID: Jenna Woods, female    DOB: 09-24-1960, 55 y.o.   MRN: [id number]  HPI Here today for f/u of her [place]. She tells me today she feels good.  She has not seen any more rectal bleeding. Admitted to [facility] last month for rectal bleeding felt to be diverticular and and received 2 units of blood. She denies any abdominal pain. She has a BM daily. She tells me when she laugh hard she will have pain rt upper quadrant. She does not have the pain unless she laughs hard.  Sometimes if she has a BM she feel a knot in her rt upper quadrant. Today she says she feel good. No GI complaints. She has 2-3 BMs a day. No constipation. Recently in hospital with painless large volume hematochezia, suspected diverticular bleeding.  Her appetite is good. No weight loss.  She underwent a colonoscopy 08/28/2015 (Screening) Preparation of the colon was fair. Diverticulosis in the entire examined colon. External hemorrhoids.  01/08/2016 [place] RUQ:  IMPRESSION: Tiny mobile dependent debris within the gallbladder which could represent gravel, sludge or crystals.     Review of Systems Past Medical History:  Diagnosis Date  . Allergic reaction   . Allergy    perrenial   . Anxiety   . Arthritis   . Depression    h/o suicidal ideation in 2011  . Depression with anxiety   . Diverticulosis of colon 12/19/2015  . Diverticulosis of colon with hemorrhage 12/19/2015  . GERD (gastroesophageal reflux disease)   . Hypertension 1996  . Obesity   . Peripheral vascular disease ([place])   . Prediabetes 2013  . Scleroderma ([place]) 1998  . Systemic lupus erythematosus ([place]) 1998   T[facility] at [facility]  . Tobacco abuse, in remission    10-pack-years discontinued in 2007  . Urinary frequency     Past Surgical History:  Procedure Laterality Date  . ABDOMINAL HYSTERECTOMY  1990   Neoplasm  . CESAREAN SECTION     x2  . COLONOSCOPY  2005  . COLONOSCOPY WITH PROPOFOL N/A 08/28/2015   Procedure:  COLONOSCOPY WITH PROPOFOL;  Surgeon: Rogene Houston, MD;  Location: [facility] ENDO [facility]ITE;  Service: Endoscopy;  Laterality: N/A;  10:30  . FINGER AMPUTATION     Third finger, bilateral, distal  . NASAL SINUS SURGERY  09/06/2011   Procedure: ENDOSCOPIC SINUS SURGERY;  Surgeon: Ascencion Dike, MD;  Location: [place];  Service: ENT;  Laterality: N/A;  Nasal cerebrospinal fluid leak repair with Left temporalis fascia graft and Left Ear cartilage graft  . VASCULAR SURGERY     Hands, bilaterally, [facility]; right hand partial amputation of middle finger.    Allergies  Allergen Reactions  . Bee Venom Anaphylaxis  . Sesame Oil Anaphylaxis and Swelling    (Sesame seed)  . Molds & Smuts   . Codeine Itching and Rash    Current Outpatient Prescriptions on File Prior to Visit  Medication Sig Dispense Refill  . ALPRAZolam (XANAX) 1 MG tablet Take 1 tablet (1 mg total) by mouth 3 (three) times daily. 90 tablet 3  . amLODipine (NORVASC) 10 MG tablet TAKE ONE (1) TABLET BY MOUTH EACH DAY 90 tablet 1  . aspirin EC 81 MG tablet Take 1 tablet (81 mg total) by mouth daily. Resume in 1 week    . Calcium Carbonate-Vit D-Min (CALCIUM 1200) 1200-1000 MG-UNIT CHEW Chew 1 tablet by mouth daily.    . fesoterodine (TOVIAZ) 8  MG TB24 tablet Take 1 tablet (8 mg total) by mouth daily. 30 tablet 1  . metFORMIN (GLUCOPHAGE) 500 MG tablet Take 1 tablet (500 mg total) by mouth daily with breakfast. 90 tablet 1  . montelukast (SINGULAIR) 10 MG tablet Take 1 tablet (10 mg total) by mouth at bedtime. 90 tablet 1  . NEXIUM 40 MG capsule TAKE ONE (1) CAPSULE EACH DAY BEFORE BREAKFAST 30 capsule 4  . traZODone (DESYREL) 100 MG tablet Take 1 tablet (100 mg total) by mouth at bedtime as needed for sleep. 30 tablet 3   No current facility-administered medications on file prior to visit.        Objective:   Physical Exam Blood pressure 108/80, pulse 80, temperature 98.4 F (36.9 C), height 5\' 6"  (1.676 m), weight  230 lb (104.3 kg).  Alert and oriented. Skin warm and dry. Oral mucosa is moist.   . Sclera anicteric, conjunctivae is pink. Thyroid not enlarged. No cervical lymphadenopathy. Lungs clear. Heart regular rate and rhythm.  Abdomen is soft. Bowel sounds are positive. No hepatomegaly. No abdominal masses felt. No tenderness.  No edema to lower extremities.         Assessment & Plan:  RUQ pain. Am going to get a Hepatic function . If pain returns, will repeat Hepatic function. OV in 8 weeks.

## 2016-01-20 NOTE — Patient Instructions (Signed)
Hepatic function

## 2016-01-21 LAB — HEPATIC FUNCTION PANEL
ALK PHOS: 67 U/L (ref 33–130)
ALT: 9 U/L (ref 6–29)
AST: 12 U/L (ref 10–35)
Albumin: 3.8 g/dL (ref 3.6–5.1)
BILIRUBIN DIRECT: 0 mg/dL (ref <=0.2)
BILIRUBIN TOTAL: 0.2 mg/dL (ref 0.2–1.2)
Indirect Bilirubin: 0.2 mg/dL (ref 0.2–1.2)
Total Protein: 6.1 g/dL (ref 6.1–8.1)

## 2016-01-23 ENCOUNTER — Other Ambulatory Visit: Payer: Self-pay | Admitting: Family Medicine

## 2016-02-08 ENCOUNTER — Ambulatory Visit (HOSPITAL_COMMUNITY)
Admission: RE | Admit: 2016-02-08 | Discharge: 2016-02-08 | Disposition: A | Discharge status: Home / Self Care | Payer: Medicare Other | Source: Ambulatory Visit | Attending: Family Medicine | Admitting: Family Medicine

## 2016-02-08 DIAGNOSIS — Z1231 Encounter for screening mammogram for malignant neoplasm of breast: Secondary | ICD-10-CM | POA: Diagnosis not present

## 2016-02-19 ENCOUNTER — Ambulatory Visit (INDEPENDENT_AMBULATORY_CARE_PROVIDER_SITE_OTHER): Payer: Medicare Other | Admitting: Psychiatry

## 2016-02-19 ENCOUNTER — Encounter (HOSPITAL_COMMUNITY): Payer: Self-pay | Admitting: Psychiatry

## 2016-02-19 VITALS — BP 112/69 | HR 105 | Ht 66.0 in | Wt 232.6 lb

## 2016-02-19 DIAGNOSIS — Z8249 Family history of ischemic heart disease and other diseases of the circulatory system: Secondary | ICD-10-CM | POA: Diagnosis not present

## 2016-02-19 DIAGNOSIS — Z9889 Other specified postprocedural states: Secondary | ICD-10-CM

## 2016-02-19 DIAGNOSIS — Z8261 Family history of arthritis: Secondary | ICD-10-CM | POA: Diagnosis not present

## 2016-02-19 DIAGNOSIS — Z813 Family history of other psychoactive substance abuse and dependence: Secondary | ICD-10-CM

## 2016-02-19 DIAGNOSIS — Z87891 Personal history of nicotine dependence: Secondary | ICD-10-CM

## 2016-02-19 DIAGNOSIS — Z811 Family history of alcohol abuse and dependence: Secondary | ICD-10-CM

## 2016-02-19 DIAGNOSIS — F331 Major depressive disorder, recurrent, moderate: Secondary | ICD-10-CM

## 2016-02-19 MED ORDER — ALPRAZOLAM 1 MG PO TABS: 1.0000 mg | ORAL_TABLET | Freq: Three times a day (TID) | ORAL | 3 refills | Status: DC

## 2016-02-19 MED ORDER — TRAZODONE HCL 100 MG PO TABS: 100.0000 mg | ORAL_TABLET | Freq: Every evening | ORAL | 3 refills | Status: DC | PRN
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
## 2016-03-03 ENCOUNTER — Ambulatory Visit (HOSPITAL_COMMUNITY): Payer: Self-pay | Admitting: Psychiatry

## 2016-03-16 ENCOUNTER — Ambulatory Visit (INDEPENDENT_AMBULATORY_CARE_PROVIDER_SITE_OTHER): Payer: Self-pay | Admitting: Internal Medicine

## 2016-03-28 ENCOUNTER — Telehealth: Payer: Self-pay | Admitting: Family Medicine

## 2016-03-28 NOTE — Telephone Encounter (Signed)
Left message that appointment on 04-19-16 at 2 pm needs to be rescheduled.  Could be scheduled 04-26-16 at 2 pm if that works for her.

## 2016-04-18 ENCOUNTER — Ambulatory Visit (INDEPENDENT_AMBULATORY_CARE_PROVIDER_SITE_OTHER): Payer: Medicare Other

## 2016-04-18 VITALS — BP 110/68 | HR 94 | Temp 98.9°F | Ht 66.0 in | Wt 231.0 lb

## 2016-04-18 DIAGNOSIS — Z0000 Encounter for general adult medical examination without abnormal findings: Secondary | ICD-10-CM

## 2016-04-18 DIAGNOSIS — I10 Essential (primary) hypertension: Secondary | ICD-10-CM | POA: Diagnosis not present

## 2016-04-18 DIAGNOSIS — R7302 Impaired glucose tolerance (oral): Secondary | ICD-10-CM

## 2016-04-18 NOTE — Patient Instructions (Addendum)
Health maintenance: Up to date   Abnormal screenings: None   Patient concerns: None   Nurse concerns: Obesity, recommend eating 3 meals a day and cutting back on portion sizes.   Next PCP appt: Follow up with Dr. Moshe Cipro in 3 months (end of April of early May). Fasting lab work has been ordered for you today please have this completed a week prior to your appointment with Dr. Moshe Cipro. Follow up in 1 year for your annual wellness visit.    Please bring a copy of your POA (Power of Joshua) and/or Living Will to your next appointment.   Preventive Care 40-64 Years, Female Preventive care refers to lifestyle choices and visits with your health care provider that can promote health and wellness. What does preventive care include?  A yearly physical exam. This is also called an annual well check.  Dental exams once or twice a year.  Routine eye exams. Ask your health care provider how often you should have your eyes checked.  Personal lifestyle choices, including:  Daily care of your teeth and gums.  Regular physical activity.  Eating a healthy diet.  Avoiding tobacco and drug use.  Limiting alcohol use.  Practicing safe sex.  Taking low-dose aspirin daily starting at age 45.  Taking vitamin and mineral supplements as recommended by your health care provider. What happens during an annual well check? The services and screenings done by your health care provider during your annual well check will depend on your age, overall health, lifestyle risk factors, and family history of disease. Counseling  Your health care provider may ask you questions about your:  Alcohol use.  Tobacco use.  Drug use.  Emotional well-being.  Home and relationship well-being.  Sexual activity.  Eating habits.  Work and work Statistician.  Method of birth control.  Menstrual cycle.  Pregnancy history. Screening  You may have the following tests or measurements:  Height,  weight, and BMI.  Blood pressure.  Lipid and cholesterol levels. These may be checked every 5 years, or more frequently if you are over 55 years old.  Skin check.  Lung cancer screening. You may have this screening every year starting at age 64 if you have a 30-pack-year history of smoking and currently smoke or have quit within the past 15 years.  Fecal occult blood test (FOBT) of the stool. You may have this test every year starting at age 58.  Flexible sigmoidoscopy or colonoscopy. You may have a sigmoidoscopy every 5 years or a colonoscopy every 10 years starting at age 58.  Hepatitis C blood test.  Diabetes screening. This is done by checking your blood sugar (glucose) after you have not eaten for a while (fasting). You may have this done every 1-3 years.  Mammogram. This may be done every 1-2 years. Talk to your health care provider about when you should start having regular mammograms. This may depend on whether you have a family history of breast cancer.  Pelvic exam and Pap test. This may be done every 3 years starting at age 70. Starting at age 67, this may be done every 5 years if you have a Pap test in combination with an HPV test.  Bone density scan. This is done to screen for osteoporosis. You may have this scan if you are at high risk for osteoporosis. Discuss your test results, treatment options, and if necessary, the need for more tests with your health care provider. Vaccines  Your health care provider may recommend  certain vaccines, such as:  Influenza vaccine. This is recommended every year. Tetanus, diphtheria, and acellular pertussis (Tdap, Td) vaccine. You may need a Td booster every 10 years.  Obesity, Adult Introduction Obesity is having too much body fat. If you have a BMI of 30 or more, you are obese. BMI is a number that explains how much body fat you have. Obesity is often caused by taking in (consuming) more calories than your body uses. Obesity can  cause serious health problems. Changing your lifestyle can help to treat obesity. Follow these instructions at home: Eating and drinking Follow advice from your doctor about what to eat and drink. Your doctor may tell you to: Cut down on (limit) fast foods, sweets, and processed snack foods. Choose low-fat options. For example, choose low-fat milk instead of whole milk. Eat 5 or more servings of fruits or vegetables every day. Eat at home more often. This gives you more control over what you eat. Choose healthy foods when you eat out. Learn what a healthy portion size is. A portion size is the amount of a certain food that is healthy for you to eat at one time. This is different for each person. Keep low-fat snacks available. Avoid sugary drinks. These include soda, fruit juice, iced tea that is sweetened with sugar, and flavored milk. Eat a healthy breakfast. Drink enough water to keep your pee (urine) clear or pale yellow. Do not go without eating for long periods of time (do not fast). Do not go on popular or trendy diets (fad diets). Physical Activity Exercise often, as told by your doctor. Ask your doctor: What types of exercise are safe for you. How often you should exercise. Warm up and stretch before being active. Do slow stretching after being active (cool down). Rest between times of being active. Lifestyle Limit how much time you spend in front of your TV, computer, or video game system (be less sedentary). Find ways to reward yourself that do not involve food. Limit alcohol intake to no more than 1 drink a day for nonpregnant women and 2 drinks a day for men. One drink equals 12 oz of beer, 5 oz of wine, or 1 oz of hard liquor. General instructions Keep a weight loss journal. This can help you keep track of: The food that you eat. The exercise that you do. Take over-the-counter and prescription medicines only as told by your doctor. Take vitamins and supplements only as  told by your doctor. Think about joining a support group. Your doctor may be able to help with this. Keep all follow-up visits as told by your doctor. This is important. Contact a doctor if: You cannot meet your weight loss goal after you have changed your diet and lifestyle for 6 weeks. This information is not intended to replace advice given to you by your health care provider. Make sure you discuss any questions you have with your health care provider. Document Released: 05/16/2011 Document Revised: 07/30/2015 Document Reviewed: 12/10/2014  2017 Elsevier    Zoster vaccine. You may need this after age 11.  Pneumococcal 13-valent conjugate (PCV13) vaccine. You may need this if you have certain conditions and were not previously vaccinated.  Pneumococcal polysaccharide (PPSV23) vaccine. You may need one or two doses if you smoke cigarettes or if you have certain conditions.  Talk to your health care provider about which screenings and vaccines you need and how often you need them. This information is not intended to replace advice given  to you by your health care provider. Make sure you discuss any questions you have with your health care provider. Document Released: 03/20/2015 Document Revised: 11/11/2015 Document Reviewed: 12/23/2014 Elsevier Interactive Patient Education  2017 [employer].

## 2016-04-18 NOTE — Progress Notes (Signed)
Subjective:   Jenna Woods is a 56 y.o. female who presents for Medicare Annual (Subsequent) preventive examination.  Review of Systems:  Cardiac Risk Factors include: hypertension;obesity (BMI >30kg/m2);family history of premature cardiovascular disease     Objective:     Vitals: BP 110/68   Pulse 94   Temp 98.9 F (37.2 C) (Oral)   Ht 5\' 6"  (1.676 m)   Wt 231 lb 0.6 oz (104.8 kg)   SpO2 96%   BMI 37.29 kg/m   Body mass index is 37.29 kg/m.   Tobacco History  Smoking Status  . Former Smoker  . Packs/day: 0.25  . Years: 25.00  . Types: Cigarettes  . Quit date: 04/07/2001  Smokeless Tobacco  . Never Used     Counseling given: Not Answered   Past Medical History:  Diagnosis Date  . Allergic reaction   . Allergy    perrenial   . Anxiety   . Arthritis   . Depression    h/o suicidal ideation in 2011  . Depression with anxiety   . Diverticulosis of colon 12/19/2015  . Diverticulosis of colon with hemorrhage 12/19/2015  . GERD (gastroesophageal reflux disease)   . Hypertension 1996  . Obesity   . Peripheral vascular disease ([place])   . Prediabetes 2013  . Scleroderma ([place]) 1998  . Systemic lupus erythematosus ([place]) 1998   Treated at [facility]  . Tobacco abuse, in remission    10-pack-years discontinued in 2007  . Urinary frequency    Past Surgical History:  Procedure Laterality Date  . ABDOMINAL HYSTERECTOMY  1990   Neoplasm  . CESAREAN SECTION     x2  . COLONOSCOPY  2005  . COLONOSCOPY WITH PROPOFOL N/A 08/28/2015   Procedure: COLONOSCOPY WITH PROPOFOL;  Surgeon: Rogene Houston, MD;  [facility];  Service: Endoscopy;  Laterality: N/A;  10:30  . FINGER AMPUTATION     Third finger, bilateral, distal  . NASAL SINUS SURGERY  09/06/2011   Procedure: ENDOSCOPIC SINUS SURGERY;  Surgeon: Ascencion Dike, MD;  Location: [facility];  Service: ENT;  Laterality: N/A;  Nasal cerebrospinal fluid leak repair with Left temporalis fascia graft  and Left Ear cartilage graft  . VASCULAR SURGERY     Hands, bilaterally, [facility]; right hand partial amputation of middle finger.   Family History  Problem Relation Age of Onset  . Arthritis Mother     Rheumatoid  . Dementia Mother   . Hypertension Mother   . Cirrhosis Father   . Alcohol abuse Father   . Arthritis Maternal Grandmother   . Drug abuse Sister   . Graves' disease Son    History  Sexual Activity  . Sexual activity: Yes  . Birth control/ protection: Surgical    Outpatient Encounter Prescriptions as of 04/18/2016  Medication Sig  . ALPRAZolam (XANAX) 1 MG tablet Take 1 tablet (1 mg total) by mouth 3 (three) times daily.  Marland Kitchen amLODipine (NORVASC) 10 MG tablet TAKE ONE (1) TABLET BY MOUTH EACH DAY  . aspirin EC 81 MG tablet Take 1 tablet (81 mg total) by mouth daily. Resume in 1 week  . Calcium Carbonate-Vit D-Min (CALCIUM 1200) 1200-1000 MG-UNIT CHEW Chew 1 tablet by mouth daily.  . metFORMIN (GLUCOPHAGE) 500 MG tablet Take 1 tablet (500 mg total) by mouth daily with breakfast.  . montelukast (SINGULAIR) 10 MG tablet Take 1 tablet (10 mg total) by mouth at bedtime.  Marland Kitchen NEXIUM 40 MG capsule TAKE  1 CAPSULE EACH DAY BEFORE BREAKFAST  . TOVIAZ 8 MG TB24 tablet TAKE ONE (1) TABLET EACH DAY  . traZODone (DESYREL) 100 MG tablet Take 1 tablet (100 mg total) by mouth at bedtime as needed for sleep.   No facility-administered encounter medications on file as of 04/18/2016.     Activities of Daily Living In your present state of health, do you have any difficulty performing the following activities: 04/18/2016 12/19/2015  Hearing? N N  Vision? N N  Difficulty concentrating or making decisions? N N  Walking or climbing stairs? N N  Dressing or bathing? N N  Doing errands, shopping? N N  Preparing Food and eating ? N -  Using the Toilet? N -  In the past six months, have you accidently leaked urine? N -  Do you have problems with loss of bowel control? N -  Managing  your Medications? N -  Managing your Finances? N -  Housekeeping or managing your Housekeeping? N -  Some recent data might be hidden    Patient Care Team: Fayrene Helper, MD as PCP - General Cloria Spring, MD as Consulting Physician [facility])    Assessment:    Exercise Activities and Dietary recommendations Current Exercise Habits: Home exercise routine, Type of exercise: walking, Time (Minutes): 35, Frequency (Times/Week): 3, Weekly Exercise (Minutes/Week): 105, Intensity: Mild  Goals    . Increase physical activity          Patient would like to increase her exercise to 5 days a week.     . Quit smoking / using tobacco      Fall Risk Fall Risk  04/18/2016 03/27/2015 10/05/2012  Falls in the past year? No No No   Depression Screen PHQ 2/9 Scores 04/18/2016 03/27/2015 06/04/2014 03/05/2014  PHQ - 2 Score 1 6 3 3   PHQ- 9 Score - 22 11 17      Cognitive Function    Normal 6CIT Screen 04/18/2016  What Year? 0 points  What month? 0 points  What time? 0 points  Count back from 20 0 points  Months in reverse 0 points  Repeat phrase 0 points  Total Score 0    Immunization History  Administered Date(s) Administered  . Influenza Split 01/10/2012  . Influenza Whole 12/04/2009, 12/29/2010  . Influenza,inj,Quad PF,36+ Mos 11/29/2012, 10/28/2013, 11/18/2014, 12/15/2015  . PPD Test 11/09/2011  . Tdap 12/29/2010   Screening Tests Health Maintenance  Topic Date Due  . PAP SMEAR  04/26/2018 (Originally 02/07/2015)  . MAMMOGRAM  02/07/2018  . TETANUS/TDAP  12/28/2020  . COLONOSCOPY  08/27/2025  . INFLUENZA VACCINE  Completed  . Hepatitis C Screening  Completed  . HIV Screening  Completed      Plan:  I have personally reviewed and addressed the Medicare Annual Wellness questionnaire and have noted the following in the patient's chart:  A. Medical and social history B. Use of alcohol, tobacco or illicit drugs  C. Current medications and  supplements D. Functional ability and status E.  Nutritional status F.  Physical activity G. Advance directives discussed today, please bring a copy at your next visit. H. List of other physicians I.  Hospitalizations, surgeries, and ER visits in previous 12 months J.  [place] to include cognitive, depression, and falls L. Referrals and appointments - none  In addition, I have reviewed and discussed with patient certain preventive protocols, quality metrics, and best practice recommendations. A written personalized care plan for preventive services as  well as general preventive health recommendations were provided to patient.  Signed,   Stormy Fabian, LPN Lead Nurse Health Advisor

## 2016-04-19 ENCOUNTER — Ambulatory Visit: Payer: Self-pay

## 2016-06-17 ENCOUNTER — Encounter (HOSPITAL_COMMUNITY): Payer: Self-pay | Admitting: Psychiatry

## 2016-06-17 ENCOUNTER — Ambulatory Visit (INDEPENDENT_AMBULATORY_CARE_PROVIDER_SITE_OTHER): Payer: Medicare Other | Admitting: Psychiatry

## 2016-06-17 VITALS — BP 105/93 | HR 100 | Ht 66.0 in | Wt 227.2 lb

## 2016-06-17 DIAGNOSIS — Z813 Family history of other psychoactive substance abuse and dependence: Secondary | ICD-10-CM | POA: Diagnosis not present

## 2016-06-17 DIAGNOSIS — F331 Major depressive disorder, recurrent, moderate: Secondary | ICD-10-CM | POA: Diagnosis not present

## 2016-06-17 DIAGNOSIS — Z87891 Personal history of nicotine dependence: Secondary | ICD-10-CM

## 2016-06-17 DIAGNOSIS — Z7982 Long term (current) use of aspirin: Secondary | ICD-10-CM

## 2016-06-17 DIAGNOSIS — Z811 Family history of alcohol abuse and dependence: Secondary | ICD-10-CM | POA: Diagnosis not present

## 2016-06-17 DIAGNOSIS — Z79899 Other long term (current) drug therapy: Secondary | ICD-10-CM

## 2016-06-17 DIAGNOSIS — Z810 Family history of intellectual disabilities: Secondary | ICD-10-CM

## 2016-06-17 MED ORDER — ALPRAZOLAM 1 MG PO TABS: 1.0000 mg | ORAL_TABLET | Freq: Three times a day (TID) | ORAL | 3 refills | Status: DC

## 2016-06-17 MED ORDER — TRAZODONE HCL 100 MG PO TABS: 100.0000 mg | ORAL_TABLET | Freq: Every evening | ORAL | 3 refills | Status: DC | PRN
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
## 2016-06-20 ENCOUNTER — Telehealth (HOSPITAL_COMMUNITY): Payer: Self-pay | Admitting: *Deleted

## 2016-06-20 NOTE — Telephone Encounter (Signed)
phone call from patient, she said she can't find the Xanax.   She said she don't know what happened to it.  She wants to know if you can just send it on the the drugstore.

## 2016-06-21 MED ORDER — ALPRAZOLAM 1 MG PO TABS: 1.0000 mg | ORAL_TABLET | Freq: Three times a day (TID) | ORAL | 3 refills | Status: DC

## 2016-06-21 NOTE — Telephone Encounter (Signed)
Per Dr. Harrington Challenger to call in pt Xanax to her pharmacy. Per pt chart, medication was printed on 06-17-2016 for 90 tabs 3 refills. Called pt pharmacy and spoke with [place]. Informed her with what provider stated and she stated they will get that ready for pt. Per previous print out for pt Xanax, provider had given 90 tabs 3 refills on 06-17-2016 and RMA informed the same thing to Tammy who verbalized understanding.

## 2016-06-21 NOTE — Telephone Encounter (Signed)
You may call it in

## 2016-06-21 NOTE — Telephone Encounter (Signed)
Per Dr. Harrington Challenger to call in pt Xanax to her pharmacy. Per pt chart, medication was printed on 06-17-2016 for 90 tabs 3 refills. Called pt pharmacy and spoke with [place]. Informed her with what provider stated and she stated they will get that ready for pt. Per previous print out for pt Xanax, provider had given 90 tabs 3 refills on 06-17-2016 and RMA informed the same thing to Tammy who verbalized understanding.

## 2016-06-30 DIAGNOSIS — R7302 Impaired glucose tolerance (oral): Secondary | ICD-10-CM | POA: Diagnosis not present

## 2016-06-30 DIAGNOSIS — I10 Essential (primary) hypertension: Secondary | ICD-10-CM | POA: Diagnosis not present

## 2016-06-30 LAB — BASIC METABOLIC PANEL
BUN: 10 mg/dL (ref 7–25)
CALCIUM: 9.2 mg/dL (ref 8.6–10.4)
CO2: 27 mmol/L (ref 20–31)
Chloride: 107 mmol/L (ref 98–110)
Creat: 0.71 mg/dL (ref 0.50–1.05)
GLUCOSE: 98 mg/dL (ref 65–99)
POTASSIUM: 3.8 mmol/L (ref 3.5–5.3)
SODIUM: 140 mmol/L (ref 135–146)

## 2016-06-30 LAB — LIPID PANEL
CHOL/HDL RATIO: 2.7 ratio (ref <5.0)
Cholesterol: 114 mg/dL (ref <200)
HDL: 43 mg/dL — ABNORMAL LOW (ref >50)
LDL CALC: 56 mg/dL (ref <100)
Triglycerides: 76 mg/dL (ref <150)
VLDL: 15 mg/dL (ref <30)

## 2016-07-01 LAB — HEMOGLOBIN A1C
Hgb A1c MFr Bld: 5.5 % (ref <5.7)
MEAN PLASMA GLUCOSE: 111 mg/dL

## 2016-07-02 ENCOUNTER — Encounter (HOSPITAL_COMMUNITY): Payer: Self-pay | Admitting: *Deleted

## 2016-07-02 ENCOUNTER — Emergency Department (HOSPITAL_COMMUNITY)
Admission: EM | Admit: 2016-07-02 | Discharge: 2016-07-02 | Disposition: A | Discharge status: Home Health | Payer: Medicare Other | Attending: Emergency Medicine | Admitting: Emergency Medicine

## 2016-07-02 DIAGNOSIS — Z7982 Long term (current) use of aspirin: Secondary | ICD-10-CM | POA: Diagnosis not present

## 2016-07-02 DIAGNOSIS — S70361A Insect bite (nonvenomous), right thigh, initial encounter: Secondary | ICD-10-CM | POA: Insufficient documentation

## 2016-07-02 DIAGNOSIS — Z7984 Long term (current) use of oral hypoglycemic drugs: Secondary | ICD-10-CM | POA: Diagnosis not present

## 2016-07-02 DIAGNOSIS — S50361A Insect bite (nonvenomous) of right elbow, initial encounter: Secondary | ICD-10-CM | POA: Insufficient documentation

## 2016-07-02 DIAGNOSIS — Z87891 Personal history of nicotine dependence: Secondary | ICD-10-CM | POA: Diagnosis not present

## 2016-07-02 DIAGNOSIS — Y929 Unspecified place or not applicable: Secondary | ICD-10-CM | POA: Diagnosis not present

## 2016-07-02 DIAGNOSIS — W57XXXA Bitten or stung by nonvenomous insect and other nonvenomous arthropods, initial encounter: Secondary | ICD-10-CM | POA: Diagnosis not present

## 2016-07-02 DIAGNOSIS — Y999 Unspecified external cause status: Secondary | ICD-10-CM | POA: Diagnosis not present

## 2016-07-02 DIAGNOSIS — I10 Essential (primary) hypertension: Secondary | ICD-10-CM | POA: Insufficient documentation

## 2016-07-02 DIAGNOSIS — Y939 Activity, unspecified: Secondary | ICD-10-CM | POA: Diagnosis not present

## 2016-07-02 MED ORDER — TRIAMCINOLONE ACETONIDE 0.1 % EX CREA: 1.0000 "application " | TOPICAL_CREAM | Freq: Three times a day (TID) | CUTANEOUS | 0 refills | Status: DC

## 2016-07-02 MED ORDER — DEXAMETHASONE SODIUM PHOSPHATE 4 MG/ML IJ SOLN
10.0000 mg | Freq: Once | INTRAMUSCULAR | Status: AC
  Administered 2016-07-02: 10 mg via INTRAMUSCULAR
  Filled 2016-07-02: qty 3

## 2016-07-02 MED ORDER — PREDNISONE 20 MG PO TABS: 40.0000 mg | ORAL_TABLET | Freq: Every day | ORAL | 0 refills | Status: DC

## 2016-07-02 NOTE — ED Triage Notes (Addendum)
Pt c/o red itchy rash after bug bite [place] the movie theatre. Pt reports she was at the [facility] movie theatre and felt a sting feel on her right arm and then started itching. Pt has reddened area to right arm and right side of abdomen. Pt took Benadryl PO yesterday and used Benadryl cream with slight relief.

## 2016-07-02 NOTE — Discharge Instructions (Signed)
Continue taking the benadryl as needed for itching.  Start the prednisone tabs tomorrow if needed.

## 2016-07-02 NOTE — ED Provider Notes (Signed)
[facility] DEPT Provider Note   CSN: [id number] Arrival date & time: 07/02/16  1606     History   Chief Complaint Chief Complaint  Patient presents with  . Insect Bite    HPI Jenna Woods is a 56 y.o. female.  HPI  Jenna Woods is a 56 y.o. female who presents to the Emergency Department complaining of itching and rash for one day.  She stats that she was bitten by some type of insect last evening while in a theater.  She describes a red raised area to her right elbow and right thigh.  She reports itching,  And applying alcohol and benadryl cream without relief.  She denies pain, fever, and swelling.   Past Medical History:  Diagnosis Date  . Allergic reaction   . Allergy    perrenial   . Anxiety   . Arthritis   . Depression    h/o suicidal ideation in 2011  . Depression with anxiety   . Diverticulosis of colon 12/19/2015  . Diverticulosis of colon with hemorrhage 12/19/2015  . GERD (gastroesophageal reflux disease)   . Hypertension 1996  . Obesity   . Peripheral vascular disease ([place])   . Prediabetes 2013  . Scleroderma ([place]) 1998  . Systemic lupus erythematosus ([place]) 1998   Treated at [facility]  . Tobacco abuse, in remission    10-pack-years discontinued in 2007  . Urinary frequency     Patient Active Problem List   Diagnosis Date Noted  . Hospital discharge follow-up 01/05/2016  . Dyspepsia 01/05/2016  . Acute blood loss anemia   . Diverticulosis of colon with hemorrhage 12/19/2015  . Insomnia due to mental disorder 09/15/2014  . Major depression 08/29/2014  . Hot flashes, menopausal 08/18/2013  . Osteoarthritis of left knee 08/05/2013  . Depression with anxiety 04/02/2013  . IGT (impaired glucose tolerance) 06/05/2012  . Vitamin D deficiency 09/23/2011  . Urinary incontinence 10/26/2010  . GERD (gastroesophageal reflux disease)   . Scleroderma ([place])   . Systemic lupus erythematosus ([place])   . Morbid obesity ([place]) 04/02/2009  .  Hypertension 04/02/2009    Past Surgical History:  Procedure Laterality Date  . ABDOMINAL HYSTERECTOMY  1990   Neoplasm  . CESAREAN SECTION     x2  . COLONOSCOPY  2005  . COLONOSCOPY WITH PROPOFOL N/A 08/28/2015   Procedure: COLONOSCOPY WITH PROPOFOL;  Surgeon: Rogene Houston, MD;  [facility];  Service: Endoscopy;  Laterality: N/A;  10:30  . FINGER AMPUTATION     Third finger, bilateral, distal  . NASAL SINUS SURGERY  09/06/2011   Procedure: ENDOSCOPIC SINUS SURGERY;  Surgeon: Ascencion Dike, MD;  Location: [facility];  Service: ENT;  Laterality: N/A;  Nasal cerebrospinal fluid leak repair with Left temporalis fascia graft and Left Ear cartilage graft  . VASCULAR SURGERY     Hands, bilaterally, [facility]; right hand partial amputation of middle finger.    OB History    Gravida Para Term Preterm AB Living   4 2     2 2    SAB TAB Ectopic Multiple Live Births   2               Home Medications    Prior to Admission medications   Medication Sig Start Date End Date Taking? Authorizing Provider  ALPRAZolam Duanne Moron) 1 MG tablet Take 1 tablet (1 mg total) by mouth 3 (three) times daily. 06/21/16   Cloria Spring, MD  amLODipine (  NORVASC) 10 MG tablet TAKE ONE (1) TABLET BY MOUTH EACH DAY 01/14/16   Fayrene Helper, MD  aspirin EC 81 MG tablet Take 1 tablet (81 mg total) by mouth daily. Resume in 1 week 12/23/15   Kathie Dike, MD  Calcium Carbonate-Vit D-Min (CALCIUM 1200) 1200-1000 MG-UNIT CHEW Chew 1 tablet by mouth daily.    Historical Provider, MD  metFORMIN (GLUCOPHAGE) 500 MG tablet Take 1 tablet (500 mg total) by mouth daily with breakfast. 12/15/15   Fayrene Helper, MD  montelukast (SINGULAIR) 10 MG tablet Take 1 tablet (10 mg total) by mouth at bedtime. 12/15/15   Fayrene Helper, MD  NEXIUM 40 MG capsule TAKE 1 CAPSULE EACH DAY BEFORE BREAKFAST 01/26/16   Fayrene Helper, MD  TOVIAZ 8 MG TB24 tablet TAKE ONE (1) TABLET EACH DAY  01/21/16   Fayrene Helper, MD  traZODone (DESYREL) 100 MG tablet Take 1 tablet (100 mg total) by mouth at bedtime as needed for sleep. 06/17/16   Cloria Spring, MD    Family History Family History  Problem Relation Age of Onset  . Arthritis Mother     Rheumatoid  . Dementia Mother   . Hypertension Mother   . Cirrhosis Father   . Alcohol abuse Father   . Arthritis Maternal Grandmother   . Drug abuse Sister   . Graves' disease Son     Social History Social History  Substance Use Topics  . Smoking status: Former Smoker    Packs/day: 0.25    Years: 25.00    Types: Cigarettes    Quit date: 04/07/2001  . Smokeless tobacco: Never Used  . Alcohol use No     Comment: quit in 2004     Allergies   Bee venom; Sesame oil; Molds & smuts; and Codeine   Review of Systems Review of Systems  Constitutional: Negative for activity change, appetite change, chills and fever.  HENT: Negative for facial swelling, sore throat and trouble swallowing.   Respiratory: Negative for chest tightness, shortness of breath and wheezing.   Musculoskeletal: Negative for neck pain and neck stiffness.  Skin: Positive for rash. Negative for wound.  Neurological: Negative for dizziness, weakness, numbness and headaches.  All other systems reviewed and are negative.    Physical Exam Updated Vital Signs BP (!) 116/91   Pulse 98   Temp 98.7 F (37.1 C) (Oral)   Resp 16   Ht 5\' 8"  (1.727 m)   Wt 103 kg   SpO2 98%   BMI 34.52 kg/m   Physical Exam  Constitutional: She is oriented to person, place, and time. She appears well-developed and well-nourished. No distress.  HENT:  Head: Normocephalic and atraumatic.  Mouth/Throat: Oropharynx is clear and moist.  Neck: Normal range of motion. Neck supple.  Cardiovascular: Normal rate, regular rhythm, normal heart sounds and intact distal pulses.   No murmur heard. Pulmonary/Chest: Effort normal and breath sounds normal. No respiratory distress.    Musculoskeletal: Normal range of motion. She exhibits no edema or tenderness.  Lymphadenopathy:    She has no cervical adenopathy.  Neurological: She is alert and oriented to person, place, and time. She exhibits normal muscle tone. Coordination normal.  Skin: Skin is warm. Capillary refill takes less than 2 seconds. Rash noted. There is erythema.  Erythematous raised plagues to the right lateral elbow and anterior right thigh.  No vesicles.  Nursing note and vitals reviewed.    ED Treatments / Results  Labs (all  labs ordered are listed, but only abnormal results are displayed) Labs Reviewed - No data to display  EKG  EKG Interpretation None       Radiology No results found.  Procedures Procedures (including critical care time)  Medications Ordered in ED Medications - No data to display   Initial Impression / Assessment and Plan / ED Course  I have reviewed the triage vital signs and the nursing notes.  Pertinent labs & imaging results that were available during my care of the patient were reviewed by me and considered in my medical decision making (see chart for details).      Pt well appearing.  Likely insect bites.  Will tx with benadryl, steroids and triamcinolone cream.    Final Clinical Impressions(s) / ED Diagnoses   Final diagnoses:  Insect bite, initial encounter    New Prescriptions Discharge Medication List as of 07/02/2016  5:10 PM    START taking these medications   Details  predniSONE (DELTASONE) 20 MG tablet Take 2 tablets (40 mg total) by mouth daily. For 4 days, Starting Sat 07/02/2016, Print    triamcinolone cream (KENALOG) 0.1 % Apply 1 application topically 3 (three) times daily., Starting Sat 07/02/2016, Print         Cherissa Hook [place], [place] 07/06/16 2227    Virgel Manifold, MD 07/07/16 [phone]

## 2016-07-04 ENCOUNTER — Encounter: Payer: Self-pay | Admitting: Family Medicine

## 2016-07-04 ENCOUNTER — Ambulatory Visit (INDEPENDENT_AMBULATORY_CARE_PROVIDER_SITE_OTHER): Payer: Medicare Other | Admitting: Family Medicine

## 2016-07-04 VITALS — BP 118/80 | HR 78 | Resp 16 | Ht 66.0 in | Wt 228.0 lb

## 2016-07-04 DIAGNOSIS — R7302 Impaired glucose tolerance (oral): Secondary | ICD-10-CM | POA: Diagnosis not present

## 2016-07-04 DIAGNOSIS — N3945 Continuous leakage: Secondary | ICD-10-CM

## 2016-07-04 DIAGNOSIS — L309 Dermatitis, unspecified: Secondary | ICD-10-CM

## 2016-07-04 DIAGNOSIS — F5105 Insomnia due to other mental disorder: Secondary | ICD-10-CM | POA: Diagnosis not present

## 2016-07-04 DIAGNOSIS — K219 Gastro-esophageal reflux disease without esophagitis: Secondary | ICD-10-CM

## 2016-07-04 DIAGNOSIS — F418 Other specified anxiety disorders: Secondary | ICD-10-CM | POA: Diagnosis not present

## 2016-07-04 DIAGNOSIS — I10 Essential (primary) hypertension: Secondary | ICD-10-CM

## 2016-07-04 MED ORDER — PANTOPRAZOLE SODIUM 40 MG PO TBEC: 40.0000 mg | DELAYED_RELEASE_TABLET | Freq: Every day | ORAL | 3 refills | Status: DC

## 2016-07-04 NOTE — Patient Instructions (Signed)
f/u with rectal oct 16 or after, call if you need me sooner  CONGRATS on improved labs  Pls consider and let me know when you are ready to see a Specialist regarding your diagnosis of Scleroderma  Rash is much better, continue medication prescribed in the ED.  New medication protonix , is sent in for your reflux  Please work on good  health habits so that your health will improve. 1. Commitment to daily physical activity for 30 to 60  minutes, if you are able to do this.  2. Commitment to wise food choices. Aim for half of your  food intake to be vegetable and fruit, one quarter starchy foods, and one quarter protein. Try to eat on a regular schedule  3 meals per day, snacking between meals should be limited to vegetables or fruits or small portions of nuts. 64 ounces of water per day is generally recommended, unless you have specific health conditions, like heart failure or kidney failure where you will need to limit fluid intake.  3. Commitment to sufficient and a  good quality of physical and mental rest daily, generally between 6 to 8 hours per day.  WITH PERSISTANCE AND PERSEVERANCE, THE IMPOSSIBLE , BECOMES THE NORM! Thank you  for choosing [facility]. We consider it a privelige to serve you.  Delivering excellent health care in a caring and  compassionate way is our goal.  Partnering with you,  so that together we can achieve this goal is our strategy.

## 2016-07-04 NOTE — Progress Notes (Signed)
Jenna Woods     MRN: [id number]      DOB: 28-Feb-1961   HPI Jenna Woods is here for follow up and re-evaluation of chronic medical conditions, medication management and review of any available recent lab and radiology data.  Preventive health is updated, specifically  Cancer screening and Immunization.   Seen in the ED this past weekend for swelling of right  arm and forearm following presumed insect bite, much improved with topical and oral steroids The PT denies any adverse reactions to current medications since the last visit.  Increased stress since her brother has been very ill states he has scleroderma and this has affected his internal organs, at this time she is not interested in having a rheumatologist check on her   ROS Denies recent fever or chills. Denies sinus pressure, nasal congestion, ear pain or sore throat. Denies chest congestion, productive cough or wheezing. Denies chest pains, palpitations and leg swelling c/o increased epigastric pain, denies nausea, vomiting,diarrhea or constipation.   Denies dysuria, frequency, hesitancy or incontinence. Denies joint pain, swelling and limitation in mobility. Denies headaches, seizures, numbness, or tingling.   PE  BP 118/80   Pulse 78   Resp 16   Ht 5\' 6"  (1.676 m)   Wt 228 lb (103.4 kg)   SpO2 98%   BMI 36.80 kg/m   Patient alert and oriented and in no cardiopulmonary distress.  HEENT: No facial asymmetry, EOMI,   oropharynx pink and moist.  Neck supple no JVD, no mass.  Chest: Clear to auscultation bilaterally.  CVS: S1, S2 no murmurs,mild epigastric tenderness, no guarding or rebound.   Ext: No edema  MS: Adequate ROM spine, shoulders, hips and knees.  Skin: Intact, mild erythema noted on right  arm and forearm  Psych: Good eye contact, normal affect. Memory intact not anxious or depressed appearing.  CNS: CN 2-12 intact, power,  normal throughout.no focal deficits noted.   Assessment &  Plan  Hypertension Controlled, no change in medication DASH diet and commitment to daily physical activity for a minimum of 30 minutes discussed and encouraged, as a part of hypertension management. The importance of attaining a healthy weight is also discussed.  BP/Weight 07/04/2016 07/02/2016 04/18/2016 01/20/2016 01/05/2016 12/23/2015 37/85/8850  Systolic BP 277 412 878 676 720 947 -  Diastolic BP 80 91 68 80 64 65 -  Wt. (Lbs) 228 227 231.04 230 229 233.4 -  BMI 36.8 34.52 37.29 37.12 36.96 - 41.34  Some encounter information is confidential and restricted. Go to Review Flowsheets activity to see all data.       Depression with anxiety Controlled, no change in medication Followed by psychiatry  GERD (gastroesophageal reflux disease) UnControlled, medication prescribed   IGT (impaired glucose tolerance) Patient educated about the importance of limiting  Carbohydrate intake , the need to commit to daily physical activity for a minimum of 30 minutes , and to commit weight loss. The fact that changes in all these areas will reduce or eliminate all together the development of diabetes is stressed.  Improved/resolved  Diabetic Labs Latest Ref Rng & Units 06/30/2016 01/05/2016 12/23/2015 12/22/2015 12/20/2015  HbA1c <5.7 % 5.5 - - - -  Chol <200 mg/dL 114 - - - -  HDL >50 mg/dL 43(L) - - - -  Calc LDL <100 mg/dL 56 - - - -  Triglycerides <150 mg/dL 76 - - - -  Creatinine 0.50 - 1.05 mg/dL 0.71 0.75 0.58 0.69 0.60   BP/Weight  07/04/2016 07/02/2016 04/18/2016 01/20/2016 01/05/2016 12/23/2015 46/80/3212  Systolic BP 248 250 037 048 889 169 -  Diastolic BP 80 91 68 80 64 65 -  Wt. (Lbs) 228 227 231.04 230 229 233.4 -  BMI 36.8 34.52 37.29 37.12 36.96 - 41.34  Some encounter information is confidential and restricted. Go to Review Flowsheets activity to see all data.   No flowsheet data found.    Urinary incontinence Controlled, no change in medication   Insomnia due to mental  disorder Sleep hygiene reviewed and written information offered also. Prescription sent for  medication needed.   Dermatitis Improved when compared to pictures seen on her phone, she is to continue topical steroid prescribed  In ED

## 2016-07-10 DIAGNOSIS — L309 Dermatitis, unspecified: Secondary | ICD-10-CM | POA: Insufficient documentation

## 2016-07-10 NOTE — Assessment & Plan Note (Addendum)
UnControlled, medication prescribed

## 2016-07-10 NOTE — Assessment & Plan Note (Signed)
Patient educated about the importance of limiting  Carbohydrate intake , the need to commit to daily physical activity for a minimum of 30 minutes , and to commit weight loss. The fact that changes in all these areas will reduce or eliminate all together the development of diabetes is stressed.  Improved/resolved  Diabetic Labs Latest Ref Rng & Units 06/30/2016 01/05/2016 12/23/2015 12/22/2015 12/20/2015  HbA1c <5.7 % 5.5 - - - -  Chol <200 mg/dL 114 - - - -  HDL >50 mg/dL 43(L) - - - -  Calc LDL <100 mg/dL 56 - - - -  Triglycerides <150 mg/dL 76 - - - -  Creatinine 0.50 - 1.05 mg/dL 0.71 0.75 0.58 0.69 0.60   BP/Weight 07/04/2016 07/02/2016 04/18/2016 01/20/2016 01/05/2016 12/23/2015 03/15/3233  Systolic BP 573 220 254 270 623 762 -  Diastolic BP 80 91 68 80 64 65 -  Wt. (Lbs) 228 227 231.04 230 229 233.4 -  BMI 36.8 34.52 37.29 37.12 36.96 - 41.34  Some encounter information is confidential and restricted. Go to Review Flowsheets activity to see all data.   No flowsheet data found.

## 2016-07-10 NOTE — Assessment & Plan Note (Signed)
Improved when compared to pictures seen on her phone, she is to continue topical steroid prescribed  In ED

## 2016-07-10 NOTE — Assessment & Plan Note (Signed)
Controlled, no change in medication  

## 2016-07-10 NOTE — Assessment & Plan Note (Signed)
Controlled, no change in medication Followed by psychiatry 

## 2016-07-10 NOTE — Assessment & Plan Note (Signed)
Controlled, no change in medication DASH diet and commitment to daily physical activity for a minimum of 30 minutes discussed and encouraged, as a part of hypertension management. The importance of attaining a healthy weight is also discussed.  BP/Weight 07/04/2016 07/02/2016 04/18/2016 01/20/2016 01/05/2016 12/23/2015 74/45/1460  Systolic BP 479 987 215 872 761 848 -  Diastolic BP 80 91 68 80 64 65 -  Wt. (Lbs) 228 227 231.04 230 229 233.4 -  BMI 36.8 34.52 37.29 37.12 36.96 - 41.34  Some encounter information is confidential and restricted. Go to Review Flowsheets activity to see all data.

## 2016-07-10 NOTE — Assessment & Plan Note (Signed)
Sleep hygiene reviewed and written information offered also. Prescription sent for  medication needed.  

## 2016-07-19 ENCOUNTER — Other Ambulatory Visit: Payer: Self-pay | Admitting: Family Medicine

## 2016-07-25 ENCOUNTER — Other Ambulatory Visit: Payer: Self-pay

## 2016-07-25 ENCOUNTER — Inpatient Hospital Stay (HOSPITAL_COMMUNITY)
Admission: EM | Admit: 2016-07-25 | Discharge: 2016-07-29 | DRG: 378 | Disposition: A | Discharge status: Home / Self Care | Payer: Medicare Other | Attending: Family Medicine | Admitting: Family Medicine

## 2016-07-25 ENCOUNTER — Encounter (HOSPITAL_COMMUNITY): Payer: Self-pay | Admitting: *Deleted

## 2016-07-25 DIAGNOSIS — K922 Gastrointestinal hemorrhage, unspecified: Secondary | ICD-10-CM

## 2016-07-25 DIAGNOSIS — E669 Obesity, unspecified: Secondary | ICD-10-CM | POA: Diagnosis present

## 2016-07-25 DIAGNOSIS — F418 Other specified anxiety disorders: Secondary | ICD-10-CM | POA: Diagnosis not present

## 2016-07-25 DIAGNOSIS — D62 Acute posthemorrhagic anemia: Secondary | ICD-10-CM | POA: Diagnosis present

## 2016-07-25 DIAGNOSIS — K219 Gastro-esophageal reflux disease without esophagitis: Secondary | ICD-10-CM | POA: Diagnosis present

## 2016-07-25 DIAGNOSIS — Z5309 Procedure and treatment not carried out because of other contraindication: Secondary | ICD-10-CM | POA: Diagnosis present

## 2016-07-25 DIAGNOSIS — K625 Hemorrhage of anus and rectum: Secondary | ICD-10-CM | POA: Diagnosis present

## 2016-07-25 DIAGNOSIS — I10 Essential (primary) hypertension: Secondary | ICD-10-CM | POA: Diagnosis not present

## 2016-07-25 DIAGNOSIS — Z7984 Long term (current) use of oral hypoglycemic drugs: Secondary | ICD-10-CM

## 2016-07-25 DIAGNOSIS — R55 Syncope and collapse: Secondary | ICD-10-CM | POA: Diagnosis present

## 2016-07-25 DIAGNOSIS — M349 Systemic sclerosis, unspecified: Secondary | ICD-10-CM | POA: Diagnosis not present

## 2016-07-25 DIAGNOSIS — E869 Volume depletion, unspecified: Secondary | ICD-10-CM | POA: Diagnosis present

## 2016-07-25 DIAGNOSIS — Z886 Allergy status to analgesic agent status: Secondary | ICD-10-CM

## 2016-07-25 DIAGNOSIS — Z87891 Personal history of nicotine dependence: Secondary | ICD-10-CM

## 2016-07-25 DIAGNOSIS — K5731 Diverticulosis of large intestine without perforation or abscess with bleeding: Secondary | ICD-10-CM | POA: Diagnosis not present

## 2016-07-25 DIAGNOSIS — Z79899 Other long term (current) drug therapy: Secondary | ICD-10-CM

## 2016-07-25 DIAGNOSIS — R7302 Impaired glucose tolerance (oral): Secondary | ICD-10-CM | POA: Diagnosis present

## 2016-07-25 DIAGNOSIS — Z888 Allergy status to other drugs, medicaments and biological substances status: Secondary | ICD-10-CM

## 2016-07-25 LAB — GLUCOSE, CAPILLARY
Glucose-Capillary: 86 mg/dL (ref 65–99)
Glucose-Capillary: 123 mg/dL — ABNORMAL HIGH (ref 65–99)

## 2016-07-25 LAB — COMPREHENSIVE METABOLIC PANEL
ALBUMIN: 3.1 g/dL — AB (ref 3.5–5.0)
ALT: 12 U/L — AB (ref 14–54)
AST: 15 U/L (ref 15–41)
Alkaline Phosphatase: 63 U/L (ref 38–126)
Anion gap: 7 (ref 5–15)
BILIRUBIN TOTAL: 0.2 mg/dL — AB (ref 0.3–1.2)
BUN: 11 mg/dL (ref 6–20)
CHLORIDE: 107 mmol/L (ref 101–111)
CO2: 25 mmol/L (ref 22–32)
CREATININE: 0.9 mg/dL (ref 0.44–1.00)
Calcium: 8.7 mg/dL — ABNORMAL LOW (ref 8.9–10.3)
GFR calc Af Amer: >60 mL/min (ref >60)
GFR calc non Af Amer: >60 mL/min (ref >60)
GLUCOSE: 157 mg/dL — AB (ref 65–99)
POTASSIUM: 3.6 mmol/L (ref 3.5–5.1)
Sodium: 139 mmol/L (ref 135–145)
TOTAL PROTEIN: 6 g/dL — AB (ref 6.5–8.1)

## 2016-07-25 LAB — HEMOGLOBIN AND HEMATOCRIT, BLOOD
HEMATOCRIT: 35.5 % — AB (ref 36.0–46.0)
HEMATOCRIT: 30.1 % — AB (ref 36.0–46.0)
HEMOGLOBIN: 11.7 g/dL — AB (ref 12.0–15.0)
Hemoglobin: 10.1 g/dL — ABNORMAL LOW (ref 12.0–15.0)

## 2016-07-25 LAB — CBC
HCT: 37.6 % (ref 36.0–46.0)
Hemoglobin: 12.5 g/dL (ref 12.0–15.0)
MCH: 30.3 pg (ref 26.0–34.0)
MCHC: 33.2 g/dL (ref 30.0–36.0)
MCV: 91 fL (ref 78.0–100.0)
Platelets: 328 10*3/uL (ref 150–400)
RBC: 4.13 MIL/uL (ref 3.87–5.11)
RDW: 13.5 % (ref 11.5–15.5)
WBC: 14.7 10*3/uL — ABNORMAL HIGH (ref 4.0–10.5)

## 2016-07-25 LAB — POC OCCULT BLOOD, ED: Fecal Occult Bld: POSITIVE — AB

## 2016-07-25 LAB — MRSA PCR SCREENING: MRSA by PCR: NEGATIVE

## 2016-07-25 MED ORDER — SODIUM CHLORIDE 0.9 % IV BOLUS (SEPSIS)
1000.0000 mL | Freq: Once | INTRAVENOUS | Status: AC
  Administered 2016-07-25: 1000 mL via INTRAVENOUS

## 2016-07-25 MED ORDER — ONDANSETRON HCL 4 MG/2ML IJ SOLN: 4.0000 mg | Freq: Four times a day (QID) | INTRAMUSCULAR | Status: DC | PRN

## 2016-07-25 MED ORDER — POTASSIUM CHLORIDE IN NACL 20-0.9 MEQ/L-% IV SOLN
INTRAVENOUS | Status: DC
  Administered 2016-07-25 – 2016-07-28 (×7): via INTRAVENOUS

## 2016-07-25 MED ORDER — FESOTERODINE FUMARATE ER 4 MG PO TB24
8.0000 mg | ORAL_TABLET | Freq: Every day | ORAL | Status: DC
  Administered 2016-07-25 – 2016-07-29 (×5): 8 mg via ORAL
  Filled 2016-07-25 (×2): qty 1
  Filled 2016-07-25: qty 2
  Filled 2016-07-25: qty 1
  Filled 2016-07-25 (×6): qty 2

## 2016-07-25 MED ORDER — TRAZODONE HCL 50 MG PO TABS: 100.0000 mg | ORAL_TABLET | Freq: Every evening | ORAL | Status: DC | PRN

## 2016-07-25 MED ORDER — ALPRAZOLAM 1 MG PO TABS
1.0000 mg | ORAL_TABLET | Freq: Three times a day (TID) | ORAL | Status: DC
  Administered 2016-07-25 – 2016-07-29 (×12): 1 mg via ORAL
  Filled 2016-07-25 (×13): qty 1

## 2016-07-25 MED ORDER — ACETAMINOPHEN 325 MG PO TABS
650.0000 mg | ORAL_TABLET | Freq: Four times a day (QID) | ORAL | Status: DC | PRN
  Administered 2016-07-27: 650 mg via ORAL
  Filled 2016-07-25: qty 2

## 2016-07-25 MED ORDER — INSULIN ASPART 100 UNIT/ML ~~LOC~~ SOLN: 0.0000 [IU] | Freq: Three times a day (TID) | SUBCUTANEOUS | Status: DC

## 2016-07-25 MED ORDER — SODIUM CHLORIDE 0.9 % IV BOLUS (SEPSIS): 2000.0000 mL | Freq: Once | INTRAVENOUS | Status: DC

## 2016-07-25 MED ORDER — PANTOPRAZOLE SODIUM 40 MG PO TBEC: 40.0000 mg | DELAYED_RELEASE_TABLET | Freq: Every day | ORAL | 1 refills | Status: DC

## 2016-07-25 MED ORDER — ONDANSETRON HCL 4 MG PO TABS: 4.0000 mg | ORAL_TABLET | Freq: Four times a day (QID) | ORAL | Status: DC | PRN

## 2016-07-25 MED ORDER — PANTOPRAZOLE SODIUM 40 MG PO TBEC
40.0000 mg | DELAYED_RELEASE_TABLET | Freq: Every day | ORAL | Status: DC
  Filled 2016-07-25 (×2): qty 1

## 2016-07-25 MED ORDER — MONTELUKAST SODIUM 10 MG PO TABS
10.0000 mg | ORAL_TABLET | Freq: Every day | ORAL | Status: DC
  Administered 2016-07-25 – 2016-07-28 (×4): 10 mg via ORAL
  Filled 2016-07-25 (×4): qty 1

## 2016-07-25 MED ORDER — ACETAMINOPHEN 650 MG RE SUPP: 650.0000 mg | Freq: Four times a day (QID) | RECTAL | Status: DC | PRN

## 2016-07-25 NOTE — ED Triage Notes (Signed)
Pt c/o getting up to go to bathroom and waking up lying in the floor; pt states she got up around 0530 but doesn't remember passing out; pt denies any pain, states she has some nausea

## 2016-07-25 NOTE — ED Provider Notes (Signed)
[facility] DEPT Provider Note   CSN: [id number] Arrival date & time: 07/25/16  7353     History   Chief Complaint Chief Complaint  Patient presents with  . Near Syncope    HPI Jenna Woods is a 56 y.o. female.  Patient states he had a syncopal episode this morning and had rectal bleeding. She has a history of diverticular bleed in October which she got transfused.   The history is provided by the patient.  Near Syncope  This is a new problem. The current episode started less than 1 hour ago. The problem occurs rarely. The problem has been resolved. Pertinent negatives include no chest pain, no abdominal pain and no headaches. Nothing aggravates the symptoms. Nothing relieves the symptoms. She has tried nothing for the symptoms.    Past Medical History:  Diagnosis Date  . Allergic reaction   . Allergy    perrenial   . Anxiety   . Arthritis   . Depression    h/o suicidal ideation in 2011  . Depression with anxiety   . Diverticulosis of colon 12/19/2015  . Diverticulosis of colon with hemorrhage 12/19/2015  . GERD (gastroesophageal reflux disease)   . Hypertension 1996  . Obesity   . Peripheral vascular disease ([place])   . Prediabetes 2013  . Scleroderma ([place]) 1998  . Systemic lupus erythematosus ([place]) 1998   [facility]  . Tobacco abuse, in remission    10-pack-years discontinued in 2007  . Urinary frequency     Patient Active Problem List   Diagnosis Date Noted  . GI bleed 07/25/2016  . Dermatitis 07/10/2016  . Diverticulosis of colon with hemorrhage 12/19/2015  . Insomnia due to mental disorder 09/15/2014  . Major depression 08/29/2014  . Osteoarthritis of left knee 08/05/2013  . Depression with anxiety 04/02/2013  . IGT (impaired glucose tolerance) 06/05/2012  . Vitamin D deficiency 09/23/2011  . Urinary incontinence 10/26/2010  . GERD (gastroesophageal reflux disease)   . Scleroderma ([place])   . Systemic lupus erythematosus ([place])   .  Morbid obesity ([place]) 04/02/2009  . Hypertension 04/02/2009    Past Surgical History:  Procedure Laterality Date  . ABDOMINAL HYSTERECTOMY  1990   Neoplasm  . CESAREAN SECTION     x2  . COLONOSCOPY  2005  . COLONOSCOPY WITH PROPOFOL N/A 08/28/2015   Procedure: COLONOSCOPY WITH PROPOFOL;  Surgeon: Rogene Houston, MD;  [facility];  Service: Endoscopy;  Laterality: N/A;  10:30  . FINGER AMPUTATION     Third finger, bilateral, distal  . NASAL SINUS SURGERY  09/06/2011   Procedure: ENDOSCOPIC SINUS SURGERY;  Surgeon: Ascencion Dike, MD;  Location: [facility];  Service: ENT;  Laterality: N/A;  Nasal cerebrospinal fluid leak repair with Left temporalis fascia graft and Left Ear cartilage graft  . VASCULAR SURGERY     Hands, bilaterally, [facility]; right hand partial amputation of middle finger.    OB History    Gravida Para Term Preterm AB Living   4 2     2 2    SAB TAB Ectopic Multiple Live Births   2               Home Medications    Prior to Admission medications   Medication Sig Start Date End Date Taking? Authorizing Provider  ALPRAZolam Duanne Moron) 1 MG tablet Take 1 tablet (1 mg total) by mouth 3 (three) times daily. 06/21/16   Cloria Spring, MD  amLODipine (  NORVASC) 10 MG tablet TAKE ONE (1) TABLET BY MOUTH EVERY DAY 07/19/16   Fayrene Helper, MD  aspirin EC 81 MG tablet Take 1 tablet (81 mg total) by mouth daily. Resume in 1 week 12/23/15   Kathie Dike, MD  Calcium Carbonate-Vit D-Min (CALCIUM 1200) 1200-1000 MG-UNIT CHEW Chew 1 tablet by mouth daily.    [provider]  montelukast (SINGULAIR) 10 MG tablet Take 1 tablet (10 mg total) by mouth at bedtime. 12/15/15   Fayrene Helper, MD  pantoprazole (PROTONIX) 40 MG tablet Take 1 tablet (40 mg total) by mouth daily. 07/04/16   Fayrene Helper, MD  predniSONE (DELTASONE) 20 MG tablet Take 2 tablets (40 mg total) by mouth daily. For 4 days 07/02/16   Kem Parkinson, PA-C    TOVIAZ 8 MG TB24 tablet TAKE ONE (1) TABLET EACH DAY 07/19/16   Fayrene Helper, MD  traZODone (DESYREL) 100 MG tablet Take 1 tablet (100 mg total) by mouth at bedtime as needed for sleep. 06/17/16   Cloria Spring, MD  triamcinolone cream (KENALOG) 0.1 % Apply 1 application topically 3 (three) times daily. 07/02/16   Kem Parkinson, PA-C    Family History Family History  Problem Relation Age of Onset  . Arthritis Mother        Rheumatoid  . Dementia Mother   . Hypertension Mother   . Cirrhosis Father   . Alcohol abuse Father   . Arthritis Maternal Grandmother   . Drug abuse Sister   . Graves' disease Son     Social History Social History  Substance Use Topics  . Smoking status: Former Smoker    Packs/day: 0.25    Years: 25.00    Types: Cigarettes    Quit date: 04/07/2001  . Smokeless tobacco: Never Used  . Alcohol use No     Comment: quit in 2004     Allergies   Bee venom; Sesame oil; Molds & smuts; and Codeine   Review of Systems Review of Systems  Constitutional: Negative for appetite change and fatigue.  HENT: Negative for congestion, ear discharge and sinus pressure.   Eyes: Negative for discharge.  Respiratory: Negative for cough.   Cardiovascular: Positive for near-syncope. Negative for chest pain.  Gastrointestinal: Positive for blood in stool. Negative for abdominal pain and diarrhea.  Genitourinary: Negative for frequency and hematuria.  Musculoskeletal: Negative for back pain.  Skin: Negative for rash.  Neurological: Positive for syncope. Negative for seizures and headaches.  Psychiatric/Behavioral: Negative for hallucinations.     Physical Exam Updated Vital Signs BP 109/80   Pulse 97   Temp 98 F (36.7 C) (Oral)   Resp 15   Ht 5\' 6"  (1.676 m)   Wt 228 lb (103.4 kg)   SpO2 99%   BMI 36.80 kg/m   Physical Exam  Constitutional: She is oriented to person, place, and time. She appears well-developed.  HENT:  Head: Normocephalic.  Eyes:  Conjunctivae and EOM are normal. No scleral icterus.  Neck: Neck supple. No thyromegaly present.  Cardiovascular: Normal rate and regular rhythm.  Exam reveals no gallop and no friction rub.   No murmur heard. Pulmonary/Chest: No stridor. She has no wheezes. She has no rales. She exhibits no tenderness.  Abdominal: She exhibits no distension. There is no tenderness. There is no rebound.  Genitourinary:  Genitourinary Comments: Rectal exam heme positive dark red stool  Musculoskeletal: Normal range of motion. She exhibits no edema.  Lymphadenopathy:    She  has no cervical adenopathy.  Neurological: She is oriented to person, place, and time. She exhibits normal muscle tone. Coordination normal.  Skin: No rash noted. No erythema.  Psychiatric: She has a normal mood and affect. Her behavior is normal.     ED Treatments / Results  Labs (all labs ordered are listed, but only abnormal results are displayed) Labs Reviewed  COMPREHENSIVE METABOLIC PANEL - Abnormal; Notable for the following:       Result Value   Glucose, Bld 157 (*)    Calcium 8.7 (*)    Total Protein 6.0 (*)    Albumin 3.1 (*)    ALT 12 (*)    Total Bilirubin 0.2 (*)    All other components within normal limits  CBC - Abnormal; Notable for the following:    WBC 14.7 (*)    All other components within normal limits  POC OCCULT BLOOD, ED - Abnormal; Notable for the following:    Fecal Occult Bld POSITIVE (*)    All other components within normal limits  TYPE AND SCREEN    EKG  EKG Interpretation  Date/Time:  Monday Jul 25 2016 06:32:11 EDT Ventricular Rate:  100 PR Interval:    QRS Duration: 92 QT Interval:  381 QTC Calculation: 492 R Axis:   70 Text Interpretation:  Sinus tachycardia Borderline repolarization abnormality Borderline prolonged QT interval no significant change since 2017 Confirmed by Sherwood Gambler [phone]) on 07/25/2016 6:41:02 AM       Radiology No results  found.  Procedures Procedures (including critical care time)  Medications Ordered in ED Medications  sodium chloride 0.9 % bolus 1,000 mL (0 mLs Intravenous Stopped 07/25/16 0822)  sodium chloride 0.9 % bolus 1,000 mL (1,000 mLs Intravenous New Bag/Given 07/25/16 0741)     Initial Impression / Assessment and Plan / ED Course  I have reviewed the triage vital signs and the nursing notes.  Pertinent labs & imaging results that were available during my care of the patient were reviewed by me and considered in my medical decision making (see chart for details).     ,edCr  CRITICAL CARE Performed by: Khristin Keleher L Total critical care time:40 minutes Critical care time was exclusive of separately billable procedures and treating other patients. Critical care was necessary to treat or prevent imminent or life-threatening deterioration. Critical care was time spent personally by me on the following activities: development of treatment plan with patient and/or surrogate as well as nursing, discussions with consultants, evaluation of patient's response to treatment, examination of patient, obtaining history from patient or surrogate, ordering and performing treatments and interventions, ordering and review of laboratory studies, ordering and review of radiographic studies, pulse oximetry and re-evaluation of patient's condition.  Final Clinical Impressions(s) / ED Diagnoses   Final diagnoses:  Lower GI bleed  Patient with lower GI bleed and hypotension. Patient's blood pressure improved with fluids. She will be admitted to medicine with a GI consult  New Prescriptions New Prescriptions   No medications on file     Milton Ferguson, MD 07/25/16 [phone]

## 2016-07-25 NOTE — Progress Notes (Signed)
Late entry from 2100: Pt c/o abdominal pain that is sudden onset. Pt states pain is 4/10. RN noted hypoactive bowel sounds and paged MD on call. No new orders. Pt stated she needed to use bathroom, and after urinating pt stated her abdominal pain was better. Will continue to monitor pt.

## 2016-07-25 NOTE — H&P (Signed)
History and Physical    Jenna Woods VQQ:[id number] DOB: December 08, 1960 DOA: 07/25/2016  PCP: Jenna Helper, MD  Patient coming from: home  I have personally briefly reviewed patient's old medical records in [place]  Chief Complaint: rectal bleeding  HPI: Jenna Woods is a 56 y.o. female with medical history significant of diverticular bleeding in the past, comes to the hospital today after a syncopal episode. Patient woke up this morning at approximately 4:30 AM with abdominal cramping. She went to the bathroom and noticed dark blood in the toilet after having a bowel movement. She returned to bed and shortly thereafter needed to have another bowel movement. She had abdominal cramping again. She went to the bathroom and after sitting on the toilet having a bowel movement, she passed out. She does not know how long he lost consciousness. Denies any head trauma. She noticed that she had blood in her stool again. She did not have any chest pain or shortness of breath. She reports her by mouth intake has been poor the last few days. She has had a headache the past 2 days. She denies taking any NSAIDs. She is not had any vomiting.  ED Course: She was noted to be mildly hypotensive which responded to IV fluids. Hemoglobin was near baseline. Rectal exam was positive for blood. She's been referred for admission.  Review of Systems: As per HPI otherwise 10 point review of systems negative.    Past Medical History:  Diagnosis Date  . Allergic reaction   . Allergy    perrenial   . Anxiety   . Arthritis   . Depression    h/o suicidal ideation in 2011  . Depression with anxiety   . Diverticulosis of colon 12/19/2015  . Diverticulosis of colon with hemorrhage 12/19/2015  . GERD (gastroesophageal reflux disease)   . Hypertension 1996  . Obesity   . Peripheral vascular disease ([place])   . Prediabetes 2013  . Scleroderma ([place]) 1998  . Systemic lupus erythematosus ([place]) 1998   T[facility] at [facility]  . Tobacco abuse, in remission    10-pack-years discontinued in 2007  . Urinary frequency     Past Surgical History:  Procedure Laterality Date  . ABDOMINAL HYSTERECTOMY  1990   Neoplasm  . CESAREAN SECTION     x2  . COLONOSCOPY  2005  . COLONOSCOPY WITH PROPOFOL N/A 08/28/2015   Dr. Laural Golden: scattered medium-mouth diverticula entire colon, external hemorrhoids  . FINGER AMPUTATION     Third finger, bilateral, distal  . NASAL SINUS SURGERY  09/06/2011   Procedure: ENDOSCOPIC SINUS SURGERY;  Surgeon: Ascencion Dike, MD;  Location: [facility];  Service: ENT;  Laterality: N/A;  Nasal cerebrospinal fluid leak repair with Left temporalis fascia graft and Left Ear cartilage graft  . VASCULAR SURGERY     Hands, bilaterally, [facility]; right hand partial amputation of middle finger.     reports that she quit smoking about 15 years ago. Her smoking use included Cigarettes. She has a 6.25 pack-year smoking history. She has never used smokeless tobacco. She reports that she does not drink alcohol or use drugs.  Allergies  Allergen Reactions  . Bee Venom Anaphylaxis  . Sesame Oil Anaphylaxis and Swelling    (Sesame seed)  . Molds & Smuts   . Codeine Itching and Rash    Family History  Problem Relation Age of Onset  . Arthritis Mother        Rheumatoid  .  Dementia Mother   . Hypertension Mother   . Cirrhosis Father   . Alcohol abuse Father   . Arthritis Maternal Grandmother   . Drug abuse Sister   . Graves' disease Son   . Colon cancer Neg Hx     Prior to Admission medications   Medication Sig Start Date End Date Taking? Authorizing Provider  ALPRAZolam Duanne Moron) 1 MG tablet Take 1 tablet (1 mg total) by mouth 3 (three) times daily. 06/21/16  Yes Cloria Spring, MD  amLODipine (NORVASC) 10 MG tablet TAKE ONE (1) TABLET BY MOUTH EVERY DAY 07/19/16  Yes Jenna Helper, MD  aspirin EC 81 MG tablet Take 1 tablet (81 mg total) by mouth daily.  Resume in 1 week 12/23/15  Yes Savilla Turbyfill, Jolaine Artist, MD  Calcium Carbonate-Vit D-Min (CALCIUM 1200) 1200-1000 MG-UNIT CHEW Chew 1 tablet by mouth daily.   Yes [provider]  metFORMIN (GLUCOPHAGE) 500 MG tablet Take 500 mg by mouth daily with breakfast.   Yes [provider]  montelukast (SINGULAIR) 10 MG tablet Take 1 tablet (10 mg total) by mouth at bedtime. 12/15/15  Yes Jenna Helper, MD  pantoprazole (PROTONIX) 40 MG tablet Take 1 tablet (40 mg total) by mouth daily. 07/04/16  Yes Jenna Helper, MD  TOVIAZ 8 MG TB24 tablet TAKE ONE (1) TABLET EACH DAY 07/19/16  Yes Jenna Helper, MD  traZODone (DESYREL) 100 MG tablet Take 1 tablet (100 mg total) by mouth at bedtime as needed for sleep. 06/17/16  Yes Cloria Spring, MD    Physical Exam: Vitals:   07/25/16 0820 07/25/16 0830 07/25/16 1006 07/25/16 1045  BP: 109/80 117/87 (!) 133/91 (!) 123/93  Pulse: 97  97 67  Resp: 15 14 19 18   Temp:    98.3 F (36.8 C)  TempSrc:    Oral  SpO2: 99%  97% 90%  Weight:    103.9 kg (229 lb 1.6 oz)  Height:    5\' 7"  (1.702 m)    Constitutional: NAD, calm, comfortable Vitals:   07/25/16 0820 07/25/16 0830 07/25/16 1006 07/25/16 1045  BP: 109/80 117/87 (!) 133/91 (!) 123/93  Pulse: 97  97 67  Resp: 15 14 19 18   Temp:    98.3 F (36.8 C)  TempSrc:    Oral  SpO2: 99%  97% 90%  Weight:    103.9 kg (229 lb 1.6 oz)  Height:    5\' 7"  (1.702 m)   Eyes: PERRL, lids and conjunctivae normal ENMT: Mucous membranes are moist. Posterior pharynx clear of any exudate or lesions.Normal dentition.  Neck: normal, supple, no masses, no thyromegaly Respiratory: clear to auscultation bilaterally, no wheezing, no crackles. Normal respiratory effort. No accessory muscle use.  Cardiovascular: Regular rate and rhythm, no murmurs / rubs / gallops. No extremity edema. 2+ pedal pulses. No carotid bruits.  Abdomen: no tenderness, no masses palpated. No hepatosplenomegaly. Bowel sounds  positive.  Musculoskeletal: no clubbing / cyanosis. No joint deformity upper and lower extremities. Good ROM, no contractures. Normal muscle tone.  Skin: no rashes, lesions, ulcers. No induration Neurologic: CN 2-12 grossly intact. Sensation intact, DTR normal. Strength 5/5 in all 4.  Psychiatric: Normal judgment and insight. Alert and oriented x 3. Normal mood.   Labs on Admission: I have personally reviewed following labs and imaging studies  CBC:  Recent Labs Lab 07/25/16 0649 07/25/16 1313  WBC 14.7*  --   HGB 12.5 11.7*  HCT 37.6 35.5*  MCV 91.0  --  PLT 328  --    Basic Metabolic Panel:  Recent Labs Lab 07/25/16 0649  NA 139  K 3.6  CL 107  CO2 25  GLUCOSE 157*  BUN 11  CREATININE 0.90  CALCIUM 8.7*   GFR: Estimated Creatinine Clearance: 84.4 mL/min (by C-G formula based on SCr of 0.9 mg/dL). Liver Function Tests:  Recent Labs Lab 07/25/16 0649  AST 15  ALT 12*  ALKPHOS 63  BILITOT 0.2*  PROT 6.0*  ALBUMIN 3.1*   No results for input(s): LIPASE, AMYLASE in the last 168 hours. No results for input(s): AMMONIA in the last 168 hours. Coagulation Profile: No results for input(s): INR, PROTIME in the last 168 hours. Cardiac Enzymes: No results for input(s): CKTOTAL, CKMB, CKMBINDEX, TROPONINI in the last 168 hours. BNP (last 3 results) No results for input(s): PROBNP in the last 8760 hours. HbA1C: No results for input(s): HGBA1C in the last 72 hours. CBG: No results for input(s): GLUCAP in the last 168 hours. Lipid Profile: No results for input(s): CHOL, HDL, LDLCALC, TRIG, CHOLHDL, LDLDIRECT in the last 72 hours. Thyroid Function Tests: No results for input(s): TSH, T4TOTAL, FREET4, T3FREE, THYROIDAB in the last 72 hours. Anemia Panel: No results for input(s): VITAMINB12, FOLATE, FERRITIN, TIBC, IRON, RETICCTPCT in the last 72 hours. Urine analysis: No results found for: COLORURINE, APPEARANCEUR, LABSPEC, PHURINE, GLUCOSEU, HGBUR, BILIRUBINUR,  KETONESUR, PROTEINUR, UROBILINOGEN, NITRITE, LEUKOCYTESUR  Radiological Exams on Admission: No results found.  EKG: Independently reviewed. Sinus tachycardia  Assessment/Plan Active Problems:   Hypertension   GERD (gastroesophageal reflux disease)   Scleroderma ([facility])   IGT (impaired glucose tolerance)   Depression with anxiety   Lower GI bleed   Syncope and collapse   Rectal bleeding     1. Rectal bleeding. Suspect recurrent diverticular bleed. Gastroenterology is following. Continue to cycle hemoglobin. Continue liquid diet for now. Supportive measures. 2. Syncope and collapse. Related to GI bleeding and volume depletion. She has been started on IV fluids. Blood pressure is better. 3. Depression and anxiety. Continue on trazodone at home dose of Xanax. 4. GERD. Continue on PPI 5. Impaired glucose tolerance. Hold metformin and start insulin scale insulin while in the hospital. 6. Hypertension. Blood pressure running low. Hold amlodipine for now. 7. Scleroderma. She is not on any maintenance therapy.  DVT prophylaxis: scds Code Status: full code Family Communication: no family present Disposition Plan: discharge home once improved Consults called: gastroenterology Admission status: observation, telemetry   Dillon Mcreynolds MD [facility] Pager [phone]  If 7PM-7AM, please contact night-coverage [email or web address] Password [email or web address]  07/25/2016, 2:10 PM

## 2016-07-25 NOTE — Consult Note (Signed)
Referring Provider: Dr. Roderic Palau  Primary Care Physician:  Fayrene Helper, MD Primary Gastroenterologist:  Dr. Laural Golden   Date of Admission: 07/25/16 Date of Consultation: 07/25/16  Reason for Consultation:  GI bleeding   HPI:  Jenna Woods is a 56 y.o. year old female who presented to [name] last October 2017 with likely diverticular bleed, receiving 2 units PRBCs at that time. Admitting Hgb 12.5, leukocytosis with WBC count 14.7. Last Hgb on file from October 11.9. Hypotensive on presentation to the ED in the 90s/50s. At time of consultation, BP has improved to 130s/90s.   States she had acute onset of bright red blood "like period clots" early this morning, associated with lower abdominal cramping. She then states that she passed out, and she believes she saw her sister helping her up. However, she states her sister was not there, as she was all alone. She then states she had another episode of rectal bleeding with abdominal cramping. Associated diaphoresis. Chills but no fever. Takes aspirin 81 mg daily but no NSAIDs. Was treated with prednisone taper the end of April but not taking currently. Sister, who is an NP at [facility] [employer], is present at bedside.   Past Medical History:  Diagnosis Date  . Allergic reaction   . Allergy    perrenial   . Anxiety   . Arthritis   . Depression    h/o suicidal ideation in 2011  . Depression with anxiety   . Diverticulosis of colon 12/19/2015  . Diverticulosis of colon with hemorrhage 12/19/2015  . GERD (gastroesophageal reflux disease)   . Hypertension 1996  . Obesity   . Peripheral vascular disease ([place])   . Prediabetes 2013  . Scleroderma ([place]) 1998  . Systemic lupus erythematosus ([place]) 1998   Treated at [facility]  . Tobacco abuse, in remission    10-pack-years discontinued in 2007  . Urinary frequency     Past Surgical History:  Procedure Laterality Date  . ABDOMINAL HYSTERECTOMY  1990   Neoplasm  . CESAREAN SECTION      x2  . COLONOSCOPY  2005  . COLONOSCOPY WITH PROPOFOL N/A 08/28/2015   Dr. Laural Golden: scattered medium-mouth diverticula entire colon, external hemorrhoids  . FINGER AMPUTATION     Third finger, bilateral, distal  . NASAL SINUS SURGERY  09/06/2011   Procedure: ENDOSCOPIC SINUS SURGERY;  Surgeon: Ascencion Dike, MD;  Location: [place];  Service: ENT;  Laterality: N/A;  Nasal cerebrospinal fluid leak repair with Left temporalis fascia graft and Left Ear cartilage graft  . VASCULAR SURGERY     Hands, bilaterally, [facility]; right hand partial amputation of middle finger.    Prior to Admission medications   Medication Sig Start Date End Date Taking? Authorizing Provider  ALPRAZolam Duanne Moron) 1 MG tablet Take 1 tablet (1 mg total) by mouth 3 (three) times daily. 06/21/16  Yes Cloria Spring, MD  amLODipine (NORVASC) 10 MG tablet TAKE ONE (1) TABLET BY MOUTH EVERY DAY 07/19/16  Yes Fayrene Helper, MD  aspirin EC 81 MG tablet Take 1 tablet (81 mg total) by mouth daily. Resume in 1 week 12/23/15  Yes Memon, Jolaine Artist, MD  Calcium Carbonate-Vit D-Min (CALCIUM 1200) 1200-1000 MG-UNIT CHEW Chew 1 tablet by mouth daily.   Yes [provider]  metFORMIN (GLUCOPHAGE) 500 MG tablet Take 500 mg by mouth daily with breakfast.   Yes [provider]  montelukast (SINGULAIR) 10 MG tablet Take 1 tablet (10 mg total) by  mouth at bedtime. 12/15/15  Yes Fayrene Helper, MD  pantoprazole (PROTONIX) 40 MG tablet Take 1 tablet (40 mg total) by mouth daily. 07/04/16  Yes Fayrene Helper, MD  TOVIAZ 8 MG TB24 tablet TAKE ONE (1) TABLET EACH DAY 07/19/16  Yes Fayrene Helper, MD  traZODone (DESYREL) 100 MG tablet Take 1 tablet (100 mg total) by mouth at bedtime as needed for sleep. 06/17/16  Yes Cloria Spring, MD  predniSONE (DELTASONE) 20 MG tablet Take 2 tablets (40 mg total) by mouth daily. For 4 days Patient not taking: Reported on 07/25/2016 07/02/16   Kem Parkinson,  PA-C  triamcinolone cream (KENALOG) 0.1 % Apply 1 application topically 3 (three) times daily. Patient not taking: Reported on 07/25/2016 07/02/16   Kem Parkinson, PA-C    No current facility-administered medications for this encounter.     Allergies as of 07/25/2016 - Review Complete 07/25/2016  Allergen Reaction Noted  . Bee venom Anaphylaxis 10/02/2013  . Sesame oil Anaphylaxis and Swelling 10/26/2010  . Molds & smuts  04/22/2011  . Codeine Itching and Rash 05/01/2009    Family History  Problem Relation Age of Onset  . Arthritis Mother        Rheumatoid  . Dementia Mother   . Hypertension Mother   . Cirrhosis Father   . Alcohol abuse Father   . Arthritis Maternal Grandmother   . Drug abuse Sister   . Graves' disease Son   . Colon cancer Neg Hx     Social History   Social History  . Marital status: Single    Spouse name: N/A  . Number of children: N/A  . Years of education: N/A   Occupational History  . 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 Smoking status: Former Smoker    Packs/day: 0.25    Years: 25.00    Types: Cigarettes    Quit date: 04/07/2001  . Smokeless tobacco: Never Used  . Alcohol use No     Comment: quit in 2004  . Drug use: No     Comment: use to use pot   . Sexual activity: Yes    Birth control/ protection: Surgical   Other Topics Concern  . Not on file   Social History Narrative  . No narrative on file    Review of Systems: Gen: Denies fever, chills, loss of appetite, change in weight or weight loss CV: Denies chest pain, heart palpitations, syncope, edema  Resp: Denies shortness of breath with rest, cough, wheezing GI: see HPI  GU : Denies urinary burning, urinary frequency, urinary incontinence.  MS: Denies joint pain,swelling, cramping Derm: Denies rash, itching, dry skin Psych: Denies depression, anxiety,confusion, or memory loss Heme: see HPI   Physical  Exam: Vital signs in last 24 hours: Temp:  [98 F (36.7 C)-98.3 F (36.8 C)] 98.3 F (36.8 C) (05/21 1045) Pulse Rate:  [67-101] 67 (05/21 1045) Resp:  [14-19] 18 (05/21 1045) BP: (92-133)/(59-93) 123/93 (05/21 1045) SpO2:  [90 %-100 %] 90 % (05/21 1045) Weight:  [228 lb (103.4 kg)-229 lb 1.6 oz (103.9 kg)] 229 lb 1.6 oz (103.9 kg) (05/21 1045) Last BM Date: 07/25/16 General:   Alert,  Well-developed, well-nourished, pleasant and cooperative in NAD Head:  Normocephalic and atraumatic. Eyes:  Sclera clear, no icterus.   Conjunctiva pink. Ears:  Normal auditory acuity. Nose:  No deformity, discharge,  or lesions. Mouth:  No deformity  or lesions, dentition normal. Lungs:  Clear throughout to auscultation.   No wheezes, crackles, or rhonchi. No acute distress. Heart:  Regular rate and rhythm; no murmurs, clicks, rubs,  or gallops. Abdomen:  Soft, mild TTP lower abdomen and nondistended. No masses, hepatosplenomegaly. Umbilical hernia noted. Normal bowel sounds, without guarding, and without rebound.   Rectal:  Deferred until time of colonoscopy.   Msk:  Symmetrical without gross deformities. Normal posture. Extremities:  Without  edema. Neurologic:  Alert and  oriented x4;  grossly normal neurologically. Psych:  Alert and cooperative. Normal mood and affect.  Intake/Output from previous day: No intake/output data recorded. Intake/Output this shift: No intake/output data recorded.  Lab Results:  Recent Labs  07/25/16 0649  WBC 14.7*  HGB 12.5  HCT 37.6  PLT 328   BMET  Recent Labs  07/25/16 0649  NA 139  K 3.6  CL 107  CO2 25  GLUCOSE 157*  BUN 11  CREATININE 0.90  CALCIUM 8.7*   LFT  Recent Labs  07/25/16 0649  PROT 6.0*  ALBUMIN 3.1*  AST 15  ALT 12*  ALKPHOS 11  BILITOT 0.2*    Impression: 56 year old female with history of presumed diverticular bleed in October 2017, presenting again with several episodes of bright red blood per rectum and  associated abdominal cramping. Syncopal episode reported by patient, and hypotension noted on arrival. Clinically, she has improved with resolved hypotension and fluid resuscitation and no further evidence of overt GI bleeding. Admitting Hgb 12.5, similar to last on file from Oct 2017 when last hospitalized. Last colonoscopy in June 2017 with documented pancolonic diverticula. Differentials include diverticular, unable to exclude ischemic etiology due to clinical presentation. Will need to monitor with serial H/H, monitor for further overt GI bleeding.   Plan: Serial H/H Monitor for further overt GI bleeding, change in clinical status May have clear liquids Continue supportive care Will continue to follow with you  Annitta Needs, PhD, ANP-BC [facility] Gastroenter[facility]     LOS: 0 days    07/25/2016, 11:10 AM

## 2016-07-26 DIAGNOSIS — I10 Essential (primary) hypertension: Secondary | ICD-10-CM | POA: Diagnosis present

## 2016-07-26 DIAGNOSIS — K922 Gastrointestinal hemorrhage, unspecified: Secondary | ICD-10-CM | POA: Diagnosis present

## 2016-07-26 DIAGNOSIS — M349 Systemic sclerosis, unspecified: Secondary | ICD-10-CM | POA: Diagnosis present

## 2016-07-26 DIAGNOSIS — Z5309 Procedure and treatment not carried out because of other contraindication: Secondary | ICD-10-CM | POA: Diagnosis present

## 2016-07-26 DIAGNOSIS — Z538 Procedure and treatment not carried out for other reasons: Secondary | ICD-10-CM | POA: Diagnosis not present

## 2016-07-26 DIAGNOSIS — K625 Hemorrhage of anus and rectum: Secondary | ICD-10-CM

## 2016-07-26 DIAGNOSIS — K5731 Diverticulosis of large intestine without perforation or abscess with bleeding: Secondary | ICD-10-CM | POA: Diagnosis not present

## 2016-07-26 DIAGNOSIS — F418 Other specified anxiety disorders: Secondary | ICD-10-CM | POA: Diagnosis not present

## 2016-07-26 DIAGNOSIS — R7302 Impaired glucose tolerance (oral): Secondary | ICD-10-CM | POA: Diagnosis not present

## 2016-07-26 DIAGNOSIS — E869 Volume depletion, unspecified: Secondary | ICD-10-CM | POA: Diagnosis not present

## 2016-07-26 DIAGNOSIS — Z7984 Long term (current) use of oral hypoglycemic drugs: Secondary | ICD-10-CM | POA: Diagnosis not present

## 2016-07-26 DIAGNOSIS — Z888 Allergy status to other drugs, medicaments and biological substances status: Secondary | ICD-10-CM | POA: Diagnosis not present

## 2016-07-26 DIAGNOSIS — Z79899 Other long term (current) drug therapy: Secondary | ICD-10-CM | POA: Diagnosis not present

## 2016-07-26 DIAGNOSIS — Z886 Allergy status to analgesic agent status: Secondary | ICD-10-CM | POA: Diagnosis not present

## 2016-07-26 DIAGNOSIS — Z87891 Personal history of nicotine dependence: Secondary | ICD-10-CM | POA: Diagnosis not present

## 2016-07-26 DIAGNOSIS — K5730 Diverticulosis of large intestine without perforation or abscess without bleeding: Secondary | ICD-10-CM | POA: Diagnosis not present

## 2016-07-26 DIAGNOSIS — E669 Obesity, unspecified: Secondary | ICD-10-CM | POA: Diagnosis present

## 2016-07-26 DIAGNOSIS — K219 Gastro-esophageal reflux disease without esophagitis: Secondary | ICD-10-CM | POA: Diagnosis not present

## 2016-07-26 DIAGNOSIS — K921 Melena: Secondary | ICD-10-CM | POA: Diagnosis not present

## 2016-07-26 DIAGNOSIS — D62 Acute posthemorrhagic anemia: Secondary | ICD-10-CM | POA: Diagnosis not present

## 2016-07-26 LAB — CBC
HEMATOCRIT: 29.4 % — AB (ref 36.0–46.0)
Hemoglobin: 9.7 g/dL — ABNORMAL LOW (ref 12.0–15.0)
MCH: 30.1 pg (ref 26.0–34.0)
MCHC: 33 g/dL (ref 30.0–36.0)
MCV: 91.3 fL (ref 78.0–100.0)
PLATELETS: 292 10*3/uL (ref 150–400)
RBC: 3.22 MIL/uL — ABNORMAL LOW (ref 3.87–5.11)
RDW: 13.6 % (ref 11.5–15.5)
WBC: 6.9 10*3/uL (ref 4.0–10.5)

## 2016-07-26 LAB — GLUCOSE, CAPILLARY
GLUCOSE-CAPILLARY: 106 mg/dL — AB (ref 65–99)
Glucose-Capillary: 93 mg/dL (ref 65–99)
Glucose-Capillary: 90 mg/dL (ref 65–99)
Glucose-Capillary: 116 mg/dL — ABNORMAL HIGH (ref 65–99)

## 2016-07-26 LAB — BASIC METABOLIC PANEL
Anion gap: 4 — ABNORMAL LOW (ref 5–15)
BUN: 10 mg/dL (ref 6–20)
CALCIUM: 8 mg/dL — AB (ref 8.9–10.3)
CO2: 25 mmol/L (ref 22–32)
CREATININE: 0.66 mg/dL (ref 0.44–1.00)
Chloride: 109 mmol/L (ref 101–111)
GFR calc Af Amer: >60 mL/min (ref >60)
GFR calc non Af Amer: >60 mL/min (ref >60)
GLUCOSE: 110 mg/dL — AB (ref 65–99)
Potassium: 4.1 mmol/L (ref 3.5–5.1)
Sodium: 138 mmol/L (ref 135–145)

## 2016-07-26 LAB — HEMOGLOBIN AND HEMATOCRIT, BLOOD
HCT: 29.2 % — ABNORMAL LOW (ref 36.0–46.0)
HCT: 31.7 % — ABNORMAL LOW (ref 36.0–46.0)
HCT: 33 % — ABNORMAL LOW (ref 36.0–46.0)
Hemoglobin: 10.4 g/dL — ABNORMAL LOW (ref 12.0–15.0)
Hemoglobin: 11 g/dL — ABNORMAL LOW (ref 12.0–15.0)
Hemoglobin: 9.8 g/dL — ABNORMAL LOW (ref 12.0–15.0)

## 2016-07-26 LAB — HIV ANTIBODY (ROUTINE TESTING W REFLEX): HIV SCREEN 4TH GENERATION: NONREACTIVE

## 2016-07-26 MED ORDER — POLYETHYLENE GLYCOL 3350 17 G PO PACK: 17.0000 g | PACK | Freq: Every day | ORAL | Status: DC | PRN

## 2016-07-26 MED ORDER — PANTOPRAZOLE SODIUM 40 MG PO TBEC
40.0000 mg | DELAYED_RELEASE_TABLET | Freq: Every day | ORAL | Status: DC
Start: 2016-07-26 — End: 2016-07-29
  Administered 2016-07-26 – 2016-07-29 (×4): 40 mg via ORAL
  Filled 2016-07-26 (×3): qty 1

## 2016-07-26 MED ORDER — POLYETHYLENE GLYCOL 3350 17 G PO PACK
17.0000 g | PACK | ORAL | Status: AC
  Administered 2016-07-26 (×3): 17 g via ORAL
  Filled 2016-07-26: qty 1

## 2016-07-26 NOTE — Progress Notes (Signed)
PROGRESS NOTE    Jenna Woods  VZD:[id number] DOB: [birth year] DOA: 07/25/2016 PCP: Fayrene Helper, MD    Brief Narrative:  56 year old female admitted with rectal bleeding and syncope. Likely diverticular bleeding. Hemoglobin has trended down since admission. Continue to follow. GI following. Supportive measures for now. She continues to have blood per rectum. Await further recommendations from GI.   Assessment & Plan:   Active Problems:   Hypertension   GERD (gastroesophageal reflux disease)   Scleroderma (HCC)   IGT (impaired glucose tolerance)   Depression with anxiety   Lower GI bleed   Syncope and collapse   Rectal bleeding   1. Rectal bleeding. Suspect recurrent diverticular bleed. Gastroenterology is following. Continue to cycle hemoglobin. On solid diet. She continues to report blood per rectum. Await further recommendations from GI. 2. Syncope and collapse. Related to GI bleeding and volume depletion. She is on IV fluids. Blood pressure is better. 3. Depression and anxiety. Continue on trazodone at home dose of Xanax. 4. GERD. Continue on PPI 5. Impaired glucose tolerance. Currently on sliding scale. Blood sugars have been stable. Resume metformin on discharge. 6. Hypertension. Blood pressure running low. Hold amlodipine for now. 7. Scleroderma. She is not on any maintenance/immunosuppressive therapy.   DVT prophylaxis: SCDs Code Status: Full code Family Communication: No family present Disposition Plan: Discharge home once improved  Consultants:   Gastroenterology  Procedures:     Antimicrobials:     Subjective: Subtle reports having blood per rectum. Continues to have abdominal cramping.  Objective: Vitals:   07/25/16 1629 07/25/16 2210 07/26/16 0539 07/26/16 1300  BP: 118/89 120/74 114/81 118/68  Pulse: 81 78 96 81  Resp: 20 20 20 20   Temp: 98.2 F (36.8 C) 98.1 F (36.7 C) 98.2 F (36.8 C) 98 F (36.7 C)  TempSrc: Oral Oral Oral  Oral  SpO2: 91% 93% 100% 97%  Weight:      Height:        Intake/Output Summary (Last 24 hours) at 07/26/16 1849 Last data filed at 07/26/16 1800  Gross per 24 hour  Intake             3540 ml  Output                0 ml  Net             3540 ml   Filed Weights   07/25/16 0628 07/25/16 1045  Weight: 103.4 kg (228 lb) 103.9 kg (229 lb 1.6 oz)    Examination:  General exam: Appears calm and comfortable  Respiratory system: Clear to auscultation. Respiratory effort normal. Cardiovascular system: S1 & S2 heard, RRR. No JVD, murmurs, rubs, gallops or clicks. No pedal edema. Gastrointestinal system: Abdomen is nondistended, soft and mild tenderness of the lower abdomen. No organomegaly or masses felt. Normal bowel sounds heard. Central nervous system: Alert and oriented. No focal neurological deficits. Extremities: Symmetric 5 x 5 power. Skin: No rashes, lesions or ulcers Psychiatry: Judgement and insight appear normal. Mood & affect appropriate.     Data Reviewed: I have personally reviewed following labs and imaging studies  CBC:  Recent Labs Lab 07/25/16 0649 07/25/16 1313 07/25/16 1959 07/26/16 0049 07/26/16 0617 07/26/16 1254  WBC 14.7*  --   --   --  6.9  --   HGB 12.5 11.7* 10.1* 10.4* 9.7* 11.0*  HCT 37.6 35.5* 30.1* 31.7* 29.4* 33.0*  MCV 91.0  --   --   --  91.3  --  PLT 328  --   --   --  292  --    Basic Metabolic Panel:  Recent Labs Lab 07/25/16 0649 07/26/16 0617  NA 139 138  K 3.6 4.1  CL 107 109  CO2 25 25  GLUCOSE 157* 110*  BUN 11 10  CREATININE 0.90 0.66  CALCIUM 8.7* 8.0*   GFR: Estimated Creatinine Clearance: 95 mL/min (by C-G formula based on SCr of 0.66 mg/dL). Liver Function Tests:  Recent Labs Lab 07/25/16 0649  AST 15  ALT 12*  ALKPHOS 63  BILITOT 0.2*  PROT 6.0*  ALBUMIN 3.1*   No results for input(s): LIPASE, AMYLASE in the last 168 hours. No results for input(s): AMMONIA in the last 168 hours. Coagulation  Profile: No results for input(s): INR, PROTIME in the last 168 hours. Cardiac Enzymes: No results for input(s): CKTOTAL, CKMB, CKMBINDEX, TROPONINI in the last 168 hours. BNP (last 3 results) No results for input(s): PROBNP in the last 8760 hours. HbA1C: No results for input(s): HGBA1C in the last 72 hours. CBG:  Recent Labs Lab 07/25/16 1612 07/25/16 2015 07/26/16 0713 07/26/16 1116 07/26/16 1626  GLUCAP 86 123* 93 106* 90   Lipid Profile: No results for input(s): CHOL, HDL, LDLCALC, TRIG, CHOLHDL, LDLDIRECT in the last 72 hours. Thyroid Function Tests: No results for input(s): TSH, T4TOTAL, FREET4, T3FREE, THYROIDAB in the last 72 hours. Anemia Panel: No results for input(s): VITAMINB12, FOLATE, FERRITIN, TIBC, IRON, RETICCTPCT in the last 72 hours. Sepsis Labs: No results for input(s): PROCALCITON, LATICACIDVEN in the last 168 hours.  Recent Results (from the past 240 hour(s))  MRSA PCR Screening     Status: None   Collection Time: 07/25/16 11:05 AM  Result Value Ref Range Status   MRSA by PCR NEGATIVE NEGATIVE Final    Comment:        The GeneXpert MRSA Assay (FDA approved for NASAL specimens only), is one component of a comprehensive MRSA colonization surveillance program. It is not intended to diagnose MRSA infection nor to guide or monitor treatment for MRSA infections.          Radiology Studies: No results found.      Scheduled Meds: . ALPRAZolam  1 mg Oral TID  . fesoterodine  8 mg Oral Daily  . insulin aspart  0-9 Units Subcutaneous TID WC  . montelukast  10 mg Oral QHS  . pantoprazole  40 mg Oral Daily   Continuous Infusions: . 0.9 % NaCl with KCl 20 mEq / L 100 mL/hr at 07/26/16 0937     LOS: 1 day    Time spent: 63mins    Dorma Altman, MD [facility] Pager [phone]  If 7PM-7AM, please contact night-coverage [email or web address] Password [facility] 07/26/2016, 6:49 PM

## 2016-07-26 NOTE — Care Management Note (Signed)
Case Management Note  Patient Details  Name: Jenna Woods MRN: [id number] Date of Birth: 09-24-1960  Subjective/Objective:                  Pt admitted with LGIB. She is from home, lives alone and is ind with ADL's. She has PCP, transportation and insurance with drug coverage. Pt communicates no needs.   Action/Plan: Pt discharging home with self care. No CM needs.   Expected Discharge Date:    07/26/2016              Expected Discharge Plan:  Home/Self Care  In-House Referral:  NA  Discharge planning Services  NA  Post Acute Care Choice:  NA Choice offered to:  NA  Status of Service:  Completed, signed off  Sherald Barge, RN 07/26/2016, 11:30 AM

## 2016-07-26 NOTE — Care Management Obs Status (Signed)
[place] NOTIFICATION   Patient Details  Name: KARMEL PATRICELLI MRN: [id number] Date of Birth: January 15, 1961   Medicare Observation Status Notification Given:  Yes    Sherald Barge, RN 07/26/2016, 11:30 AM

## 2016-07-26 NOTE — Plan of Care (Signed)
Problem: Safety: Goal: Ability to remain free from injury will improve Outcome: Progressing Pt has no c/o dizziness this shift. Pt is moderate fall risk and bed alarm is on d/t syncopal episode at home before admission. Pt gets up to bathroom standby assist. Will continue to monitor pt

## 2016-07-26 NOTE — Progress Notes (Addendum)
    Subjective: Tolerating diet. Lower abdominal discomfort/cramping. Feels bloated. Scant light blood, small amount yesterday evening. Last good BM per patient was last Wednesday.   Objective: Vital signs in last 24 hours: Temp:  [98.1 F (36.7 C)-98.3 F (36.8 C)] 98.2 F (36.8 C) (05/22 0539) Pulse Rate:  [67-97] 96 (05/22 0539) Resp:  [14-20] 20 (05/22 0539) BP: (109-133)/(74-93) 114/81 (05/22 0539) SpO2:  [90 %-100 %] 100 % (05/22 0539) Weight:  [229 lb 1.6 oz (103.9 kg)] 229 lb 1.6 oz (103.9 kg) (05/21 1045) Last BM Date: 07/25/16 General:   Alert and oriented, pleasant Head:  Normocephalic and atraumatic. Eyes:  No icterus, sclera clear. Conjuctiva pink.  Mouth:  Without lesions, mucosa pink and moist.  Abdomen:  Bowel sounds present, mild TTP lower abdomen, non-distended, obese.  Msk:  Symmetrical without gross deformities. Normal posture. Extremities:  Without edema. Neurologic:  Alert and  oriented x4 Psych:  Alert and cooperative. Normal mood and affect.  Intake/Output from previous day: 05/21 0701 - 05/22 0700 In: 2076.7 [P.O.:900; I.V.:1176.7] Out: 1 [Urine:1] Intake/Output this shift: No intake/output data recorded.  Lab Results:  Recent Labs  07/25/16 0649  07/25/16 1959 07/26/16 0049 07/26/16 0617  WBC 14.7*  --   --   --  6.9  HGB 12.5  < > 10.1* 10.4* 9.7*  HCT 37.6  < > 30.1* 31.7* 29.4*  PLT 328  --   --   --  292  < > = values in this interval not displayed. BMET  Recent Labs  07/25/16 0649 07/26/16 0617  NA 139 138  K 3.6 4.1  CL 107 109  CO2 25 25  GLUCOSE 157* 110*  BUN 11 10  CREATININE 0.90 0.66  CALCIUM 8.7* 8.0*   LFT  Recent Labs  07/25/16 0649  PROT 6.0*  ALBUMIN 3.1*  AST 15  ALT 12*  ALKPHOS 36  BILITOT 0.2*    Assessment: 56 year old female presenting with syncope and rectal bleeding likely secondary to diverticular origin. Hgb 9.7 but bleeding has tapered. Feels constipated, with last BM almost a week ago  per patient. Tolerating diet.   Plan: Stop aspirin as outpatient unless benefits outweigh risks (CVA, MI) Soft diet Miralax daily prn   Annitta Needs, PhD, ANP-BC [facility] Gastroenterology     LOS: 1 day    07/26/2016, 8:01 AM

## 2016-07-27 ENCOUNTER — Encounter (HOSPITAL_COMMUNITY)
Admission: EM | Disposition: A | Discharge status: Home / Self Care | Payer: Self-pay | Source: Home / Self Care | Attending: Family Medicine

## 2016-07-27 ENCOUNTER — Inpatient Hospital Stay (HOSPITAL_COMMUNITY): Payer: Medicare Other | Admitting: Anesthesiology

## 2016-07-27 ENCOUNTER — Encounter (HOSPITAL_COMMUNITY): Payer: Self-pay | Admitting: Anesthesiology

## 2016-07-27 DIAGNOSIS — K922 Gastrointestinal hemorrhage, unspecified: Secondary | ICD-10-CM

## 2016-07-27 DIAGNOSIS — Z538 Procedure and treatment not carried out for other reasons: Secondary | ICD-10-CM

## 2016-07-27 DIAGNOSIS — K921 Melena: Secondary | ICD-10-CM

## 2016-07-27 DIAGNOSIS — K5730 Diverticulosis of large intestine without perforation or abscess without bleeding: Secondary | ICD-10-CM

## 2016-07-27 DIAGNOSIS — K625 Hemorrhage of anus and rectum: Secondary | ICD-10-CM

## 2016-07-27 HISTORY — PX: COLONOSCOPY WITH PROPOFOL: SHX5780

## 2016-07-27 LAB — PREPARE RBC (CROSSMATCH)

## 2016-07-27 LAB — BASIC METABOLIC PANEL
Anion gap: 8 (ref 5–15)
BUN: 17 mg/dL (ref 6–20)
CALCIUM: 8.1 mg/dL — AB (ref 8.9–10.3)
CHLORIDE: 112 mmol/L — AB (ref 101–111)
CO2: 20 mmol/L — AB (ref 22–32)
CREATININE: 0.66 mg/dL (ref 0.44–1.00)
GFR calc Af Amer: >60 mL/min (ref >60)
GFR calc non Af Amer: >60 mL/min (ref >60)
GLUCOSE: 110 mg/dL — AB (ref 65–99)
Potassium: 4.6 mmol/L (ref 3.5–5.1)
Sodium: 140 mmol/L (ref 135–145)

## 2016-07-27 LAB — HEMOGLOBIN AND HEMATOCRIT, BLOOD
HCT: 23.1 % — ABNORMAL LOW (ref 36.0–46.0)
HEMATOCRIT: 30.2 % — AB (ref 36.0–46.0)
Hemoglobin: 7.5 g/dL — ABNORMAL LOW (ref 12.0–15.0)
Hemoglobin: 10.4 g/dL — ABNORMAL LOW (ref 12.0–15.0)

## 2016-07-27 LAB — GLUCOSE, CAPILLARY
GLUCOSE-CAPILLARY: 78 mg/dL (ref 65–99)
GLUCOSE-CAPILLARY: 52 mg/dL — AB (ref 65–99)
Glucose-Capillary: 102 mg/dL — ABNORMAL HIGH (ref 65–99)
Glucose-Capillary: 93 mg/dL (ref 65–99)
Glucose-Capillary: 56 mg/dL — ABNORMAL LOW (ref 65–99)
Glucose-Capillary: 77 mg/dL (ref 65–99)
Glucose-Capillary: 92 mg/dL (ref 65–99)

## 2016-07-27 LAB — CBC
HCT: 22.7 % — ABNORMAL LOW (ref 36.0–46.0)
Hemoglobin: 7.2 g/dL — ABNORMAL LOW (ref 12.0–15.0)
MCH: 29.8 pg (ref 26.0–34.0)
MCHC: 31.7 g/dL (ref 30.0–36.0)
MCV: 93.8 fL (ref 78.0–100.0)
PLATELETS: 282 10*3/uL (ref 150–400)
RBC: 2.42 MIL/uL — ABNORMAL LOW (ref 3.87–5.11)
RDW: 13.2 % (ref 11.5–15.5)
WBC: 10.4 10*3/uL (ref 4.0–10.5)

## 2016-07-27 LAB — PROTIME-INR
INR: 1.03
PROTHROMBIN TIME: 13.5 s (ref 11.4–15.2)

## 2016-07-27 SURGERY — COLONOSCOPY WITH PROPOFOL
Anesthesia: Monitor Anesthesia Care

## 2016-07-27 MED ORDER — MIDAZOLAM HCL 2 MG/2ML IJ SOLN
1.0000 mg | INTRAMUSCULAR | Status: AC
  Administered 2016-07-27: 2 mg via INTRAVENOUS

## 2016-07-27 MED ORDER — SODIUM CHLORIDE 0.9% FLUSH
INTRAVENOUS | Status: AC
Start: 2016-07-27 — End: 2016-07-27
  Filled 2016-07-27: qty 10

## 2016-07-27 MED ORDER — SODIUM CHLORIDE 0.9% FLUSH
INTRAVENOUS | Status: AC
  Filled 2016-07-27: qty 10

## 2016-07-27 MED ORDER — SODIUM CHLORIDE 0.9 % IV SOLN: Freq: Once | INTRAVENOUS | Status: DC

## 2016-07-27 MED ORDER — DEXTROSE 50 % IV SOLN
25.0000 mL | Freq: Once | INTRAVENOUS | Status: AC
  Administered 2016-07-27: 25 mL via INTRAVENOUS

## 2016-07-27 MED ORDER — DEXTROSE 50 % IV SOLN
25.0000 mL | Freq: Once | INTRAVENOUS | Status: AC
  Administered 2016-07-27: 50 mL via INTRAVENOUS

## 2016-07-27 MED ORDER — MIDAZOLAM HCL 2 MG/2ML IJ SOLN
INTRAMUSCULAR | Status: AC
  Filled 2016-07-27: qty 2

## 2016-07-27 MED ORDER — MIDAZOLAM HCL 5 MG/5ML IJ SOLN
INTRAMUSCULAR | Status: DC | PRN
  Administered 2016-07-27: 2 mg via INTRAVENOUS

## 2016-07-27 MED ORDER — LACTATED RINGERS IV SOLN
INTRAVENOUS | Status: DC
  Administered 2016-07-27: 1000 mL via INTRAVENOUS

## 2016-07-27 MED ORDER — SODIUM CHLORIDE 0.9 % IV SOLN
Freq: Once | INTRAVENOUS | Status: AC
  Administered 2016-07-27: 08:00:00 via INTRAVENOUS

## 2016-07-27 MED ORDER — DIPHENHYDRAMINE HCL 50 MG/ML IJ SOLN: 25.0000 mg | Freq: Four times a day (QID) | INTRAMUSCULAR | Status: DC | PRN

## 2016-07-27 MED ORDER — LIDOCAINE VISCOUS 2 % MT SOLN
OROMUCOSAL | Status: AC
  Filled 2016-07-27: qty 15

## 2016-07-27 MED ORDER — PROPOFOL 500 MG/50ML IV EMUL
INTRAVENOUS | Status: DC | PRN
  Administered 2016-07-27: 125 ug/kg/min via INTRAVENOUS

## 2016-07-27 MED ORDER — DEXTROSE 50 % IV SOLN
INTRAVENOUS | Status: AC
  Filled 2016-07-27: qty 50

## 2016-07-27 MED ORDER — FENTANYL CITRATE (PF) 100 MCG/2ML IJ SOLN
INTRAMUSCULAR | Status: AC
  Filled 2016-07-27: qty 2

## 2016-07-27 MED ORDER — SODIUM CHLORIDE 0.9 % IV SOLN
INTRAVENOUS | Status: DC
  Administered 2016-07-27: 13:00:00 via INTRAVENOUS

## 2016-07-27 MED ORDER — PROPOFOL 10 MG/ML IV BOLUS
INTRAVENOUS | Status: AC
  Filled 2016-07-27: qty 40

## 2016-07-27 MED ORDER — PEG 3350-KCL-NA BICARB-NACL 420 G PO SOLR
4000.0000 mL | Freq: Once | ORAL | Status: AC
  Administered 2016-07-27: 4000 mL via ORAL
  Filled 2016-07-27: qty 4000

## 2016-07-27 MED ORDER — DIPHENHYDRAMINE HCL 25 MG PO CAPS
25.0000 mg | ORAL_CAPSULE | Freq: Four times a day (QID) | ORAL | Status: DC | PRN
  Administered 2016-07-27: 25 mg via ORAL
  Filled 2016-07-27: qty 1

## 2016-07-27 NOTE — Progress Notes (Signed)
After advising Dr. Hilbert Bible of 32 beat run of Vtach, a Magnesium was added on to morning labs. Will continue to monitor pt

## 2016-07-27 NOTE — Progress Notes (Signed)
Pt has arrived back on the floor.

## 2016-07-27 NOTE — Progress Notes (Signed)
Call received from Dr. Laural Golden, Reporting Hemoglobin is 10.4. Patient's sister has been notified of result per Md's request.

## 2016-07-27 NOTE — Progress Notes (Signed)
Telemetry called RN and notified that pt had a 32 beat run of Vtach, Dr. Hilbert Bible on call paged and made aware.  Pt resting quietly in room. Awoken by voice and stated she felt fine. Awaiting orders.

## 2016-07-27 NOTE — Progress Notes (Signed)
Pt states she doesn't feel good, says she feels like she did before she came in. Adv pt that her hemoglobin was 7.5 and hematocrit is 23.1. Pt said she isn't feeling weak she just doesn't feel good.  Lab in room to draw another set of H&H. Will continue to monitor pt.

## 2016-07-27 NOTE — Op Note (Signed)
[facility] Patient Name: Jenna Woods Procedure Date: 07/27/2016 3:13 PM MRN: [id number] Date of Birth: 08-Jun-1960 Attending MD: Hildred Laser , MD CSN: [id number] Age: 56 Admit Type: Inpatient Procedure:                Colonoscopy Indications:              Hematochezia Providers:                Hildred Laser, MD, Jeanann Lewandowsky. Sharon Seller, RN, Aram Candela Referring MD:             Milton Ferguson Medicines:                Propofol per Anesthesia Complications:            No immediate complications. Estimated Blood Loss:     Estimated blood loss: none. Procedure:                Pre-Anesthesia Assessment:                           - Prior to the procedure, a History and Physical                            was performed, and patient medications and                            allergies were reviewed. The patient's tolerance of                            previous anesthesia was also reviewed. The risks                            and benefits of the procedure and the sedation                            options and risks were discussed with the patient.                            All questions were answered, and informed consent                            was obtained. Prior Anticoagulants: The patient                            last took aspirin 4 days prior to the procedure.                            ASA Grade Assessment: III - A patient with severe                            systemic disease. After reviewing the risks and  benefits, the patient was deemed in satisfactory                            condition to undergo the procedure.                           After obtaining informed consent, the colonoscope                            was passed under direct vision. Throughout the                            procedure, the patient's blood pressure, pulse, and                            oxygen saturations were monitored  continuously. The                            [id number] ([id number]) scope was introduced through                            the anus and advanced to the the sigmoid colon. The                            colonoscopy was aborted due to poor bowel prep with                            stool present. The rectum and sigmoid colon were                            photographed. The bowel preparation used was                            GoLYTELY. The quality of the bowel preparation was                            poor except the rectum was unsatisfactory. Scope In: 3:45:31 PM Scope Out: 3:50:48 PM Total Procedure Duration: 0 hours 5 minutes 17 seconds  Findings:      The digital rectal exam was normal.      large clots was found in the rectum and in the sigmoid colon. Estimated       blood loss from the procedure was none.      Scattered small and large-mouthed diverticula were found in the sigmoid       colon. Impression:               - The procedure was aborted due to poor bowel prep                            because of presence of large clots in the rectum                            and sigmoid colon as well as stool- Diverticulosis  in the sigmoid colon.                           - No specimens collected. Moderate Sedation:      Per Anesthesia Care Recommendation:           - Return patient to hospital ward for ongoing care.                           - Clear liquid diet today.                           - GI bleeding scan.                           - Post-transfusion H&H.                           - Continue present medications.                           - If the pathology report reveals no adenomatous                            tissue, then repeat the colonoscopy after studies                            are complete. Procedure Code(s):        --- Professional ---                           [phone], 6, Colonoscopy, flexible; diagnostic,                             including collection of specimen(s) by brushing or                            washing, when performed (separate procedure) Diagnosis Code(s):        --- Professional ---                           Z53.8, Procedure and treatment not carried out for                            other reasons                           K62.5, Hemorrhage of anus and rectum                           K92.2, Gastrointestinal hemorrhage, unspecified                           K92.1, Melena (includes Hematochezia)                           K57.30, Diverticulosis of large intestine without  perforation or abscess without bleeding CPT copyright 2016 American Medical Association. All rights reserved. The codes documented in this report are preliminary and upon coder review may  be revised to meet current compliance requirements. Hildred Laser, MD Hildred Laser, MD 07/27/2016 4:10:00 PM This report has been signed electronically. Number of Addenda: 0

## 2016-07-27 NOTE — Anesthesia Preprocedure Evaluation (Addendum)
Anesthesia Evaluation  Patient identified by MRN, date of birth, ID band Patient awake    Reviewed: Allergy & Precautions, H&P , NPO status , Patient's Chart, lab work & pertinent test results  Airway Mallampati: I  TM Distance: >3 FB Neck ROM: full    Dental  (+) Teeth Intact   Pulmonary sleep apnea , Current Smoker, former smoker,    breath sounds clear to auscultation       Cardiovascular hypertension, On Medications + Peripheral Vascular Disease   Rhythm:Regular Rate:Normal     Neuro/Psych PSYCHIATRIC DISORDERS Anxiety Depression negative neurological ROS     GI/Hepatic Neg liver ROS, GERD  Medicated and Controlled,  Endo/Other  diabetes (pre DM), Type 2, Oral Hypoglycemic AgentsMorbid obesity  Renal/GU negative Renal ROS  negative genitourinary   Musculoskeletal   Abdominal   Peds  Hematology negative hematology ROS (+)   Anesthesia Other Findings    Reproductive/Obstetrics negative OB ROS                             Anesthesia Physical Anesthesia Plan  ASA: III and emergent  Anesthesia Plan: MAC   Post-op Pain Management:    Induction: Intravenous  Airway Management Planned: Simple Face Mask  Additional Equipment:   Intra-op Plan:   Post-operative Plan:   Informed Consent: I have reviewed the patients History and Physical, chart, labs and discussed the procedure including the risks, benefits and alternatives for the proposed anesthesia with the patient or authorized representative who has indicated his/her understanding and acceptance.     Plan Discussed with:   Anesthesia Plan Comments:        Anesthesia Quick Evaluation

## 2016-07-27 NOTE — Transfer of Care (Signed)
Immediate Anesthesia Transfer of Care Note  Patient: Jenna Woods  Procedure(s) Performed: Procedure(s): COLONOSCOPY WITH PROPOFOL (N/A)  Patient Location: PACU  Anesthesia Type:MAC  Level of Consciousness: drowsy  Airway & Oxygen Therapy: Patient Spontanous Breathing and Patient connected to face mask oxygen  Post-op Assessment: Report given to RN, Post -op Vital signs reviewed and stable and Patient moving all extremities  Post vital signs: Reviewed and stable  Last Vitals:  Vitals:   07/27/16 1525 07/27/16 1528  BP:  122/64  Pulse:    Resp: 20 20  Temp:  36.6 C    Last Pain:  Vitals:   07/27/16 1528  TempSrc: Oral  PainSc:          Complications: No apparent anesthesia complications

## 2016-07-27 NOTE — Progress Notes (Signed)
When speaking with Dr. Shanon Brow concerning another pt, RN asked her if she received text page concerning pts hemoglobin. Dr. Shanon Brow stated she did receive the text page. No new orders given.

## 2016-07-27 NOTE — Progress Notes (Addendum)
Patient ID: Jenna Woods, female   DOB: 04-06-1960, 56 y.o.   MRN: [id number] States she does not feel any better. Has had 3 stools during the night. There is maroon colored blood in the toilet in her room and in the commode. She says her stools were black. Had not had a BM in a week. Hemoglobin this am 7.2.  She feels weak and has a headache. Her last colonoscopy (screening) was in June of 2017 which revealed diverticulosis in the entire examined colon. External hemorrhoids. Blood pressure 95/64, pulse 90, temperature 98.1 F (36.7 C), temperature source Oral, resp. rate 20, height 5\' 7"  (1.702 m), weight 229 lb 1.6 oz (103.9 kg), SpO2 100 %. Assessment: Probable diverticular bleed. Will give another unit of PRBCs.  Colonoscopy today.  GI attending note: Patient interviewed and examined. Stuttering GI bleed most likely secondary to colonic diverticulosis. Patient is on low-dose aspirin but does not take other OTC NSAIDs. She was admitted 2 days ago and was felt that she has stopped bleeding. Last colonoscopy was in June 2017 revealing pancolonic diverticulosis. She has been experiencing intermittent cramping lower abdominal pain she does not have at the present time. Abdomen is full but soft and nontender without organomegaly or masses  Will proceed with colonoscopy this afternoon once she is prepped.

## 2016-07-27 NOTE — Progress Notes (Signed)
Pt being transported down to endoscopy via nursing staff.

## 2016-07-27 NOTE — Progress Notes (Signed)
Patient Demographics:    Jenna Woods, is a 56 y.o. female, DOB - [birth year], ZDG:[id number]  Admit date - 07/25/2016   Admitting Physician Kathie Dike, MD  Outpatient Primary MD for the patient is Fayrene Helper, MD  LOS - 2   Chief Complaint  Patient presents with  . Near Syncope        Subjective:    [name] today has no fevers, no emesis,  No chest pain,  No further abdominal pain, dark/bloody stools persist  Assessment  & Plan :    Active Problems:   Hypertension   GERD (gastroesophageal reflux disease)   Scleroderma (HCC)   IGT (impaired glucose tolerance)   Depression with anxiety   Lower GI bleed   Syncope and collapse   Rectal bleeding   GI bleed   Brief Narrative:  56 year old female admitted with rectal bleeding and syncope. Likely diverticular bleeding. Hemoglobin has trended down since admission.   Plan:-  1)Rectal Bleeding-  Suspect recurrent diverticular bleed. H&H continues to drop, Gastroenterology Input appreciated, for colonoscopy on 07/27/2016.  2)Acute Blood Loss Anemia- transfuse packed cells on 07/27/2016, Risk, benefits and alternatives to transfusion discussed. Indication for transfusion discussed. Consent obtained  3)Syncope- suspect secondary to volume depletion in the setting of #1 #2 above, hemodynamics improved with hydration  4)HYperglycemia- Allow some permissive Hyperglycemia rather than risk life-threatening hypoglycemia in a patient with unreliable oral intake. Continue to hold metformin, Use Novolog/Humalog Sliding scale insulin with Accu-Cheks/Fingersticks as ordered  5)Depression and anxiety- Continue on trazodone at home dose of Xanax.  6)Hypertension- blood pressure soft despite being off amlodipine  7)Scleroderma- She is not on any maintenance/immunosuppressive therapy.  Code Status : full   Disposition Plan  :  TBD  Consults  :  Gi    DVT Prophylaxis  :    SCDs   Lab Results  Component Value Date   PLT 282 07/27/2016    Inpatient Medications  Scheduled Meds: . [MAR Hold] ALPRAZolam  1 mg Oral TID  . [MAR Hold] fesoterodine  8 mg Oral Daily  . [MAR Hold] insulin aspart  0-9 Units Subcutaneous TID WC  . [MAR Hold] montelukast  10 mg Oral QHS  . [MAR Hold] pantoprazole  40 mg Oral Daily   Continuous Infusions: . sodium chloride 20 mL/hr at 07/27/16 1245  . [MAR Hold] sodium chloride    . [MAR Hold] sodium chloride    . 0.9 % NaCl with KCl 20 mEq / L 100 mL/hr at 07/27/16 0535   PRN Meds:.[MAR Hold] acetaminophen **OR** [MAR Hold] acetaminophen, [MAR Hold] diphenhydrAMINE **OR** [MAR Hold] diphenhydrAMINE, [MAR Hold] ondansetron **OR** [MAR Hold] ondansetron (ZOFRAN) IV, [MAR Hold] traZODone    Anti-infectives    None        Objective:   Vitals:   07/27/16 1019 07/27/16 1049 07/27/16 1346 07/27/16 1430  BP: (!) 87/53 (!) 77/39 95/63 111/69  Pulse: (!) 104 96 93 89  Resp: 18 18 18 18   Temp: 98 F (36.7 C) 98.1 F (36.7 C) 97.9 F (36.6 C) 97.9 F (36.6 C)  TempSrc: Oral Oral Oral Oral  SpO2: 99% 98% 98% 98%  Weight:      Height:        Wt  Readings from Last 3 Encounters:  07/25/16 103.9 kg (229 lb 1.6 oz)  07/04/16 103.4 kg (228 lb)  07/02/16 103 kg (227 lb)     Intake/Output Summary (Last 24 hours) at 07/27/16 1442 Last data filed at 07/27/16 1430  Gross per 24 hour  Intake          3311.67 ml  Output                0 ml  Net          3311.67 ml     Physical Exam  Gen:- Awake Alert,  In no apparent distress  HEENT:- [place].AT, No sclera icterus Neck-Supple Neck,No JVD,.  Lungs-  CTAB  CV- S1, S2 normal Abd-  +ve B.Sounds, Abd Soft, No tenderness,    Extremity/Skin:- No  edema,       Data Review:   Micro Results Recent Results (from the past 240 hour(s))  MRSA PCR Screening     Status: None   Collection Time: 07/25/16 11:05 AM  Result Value Ref  Range Status   MRSA by PCR NEGATIVE NEGATIVE Final    Comment:        The GeneXpert MRSA Assay (FDA approved for NASAL specimens only), is one component of a comprehensive MRSA colonization surveillance program. It is not intended to diagnose MRSA infection nor to guide or monitor treatment for MRSA infections.     Radiology Reports No results found.   CBC  Recent Labs Lab 07/25/16 0649  07/26/16 0617 07/26/16 1254 07/26/16 1836 07/27/16 0047 07/27/16 0645  WBC 14.7*  --  6.9  --   --   --  10.4  HGB 12.5  < > 9.7* 11.0* 9.8* 7.5* 7.2*  HCT 37.6  < > 29.4* 33.0* 29.2* 23.1* 22.7*  PLT 328  --  292  --   --   --  282  MCV 91.0  --  91.3  --   --   --  93.8  MCH 30.3  --  30.1  --   --   --  29.8  MCHC 33.2  --  33.0  --   --   --  31.7  RDW 13.5  --  13.6  --   --   --  13.2  < > = values in this interval not displayed.  Chemistries   Recent Labs Lab 07/25/16 0649 07/26/16 0617 07/27/16 0645  NA 139 138 140  K 3.6 4.1 4.6  CL 107 109 112*  CO2 25 25 20*  GLUCOSE 157* 110* 110*  BUN 11 10 17   CREATININE 0.90 0.66 0.66  CALCIUM 8.7* 8.0* 8.1*  AST 15  --   --   ALT 12*  --   --   ALKPHOS 63  --   --   BILITOT 0.2*  --   --    ------------------------------------------------------------------------------------------------------------------ No results for input(s): CHOL, HDL, LDLCALC, TRIG, CHOLHDL, LDLDIRECT in the last 72 hours.  Lab Results  Component Value Date   HGBA1C 5.5 06/30/2016   ------------------------------------------------------------------------------------------------------------------ No results for input(s): TSH, T4TOTAL, T3FREE, THYROIDAB in the last 72 hours.  Invalid input(s): FREET3 ------------------------------------------------------------------------------------------------------------------ No results for input(s): VITAMINB12, FOLATE, FERRITIN, TIBC, IRON, RETICCTPCT in the last 72 hours.  Coagulation profile No results  for input(s): INR, PROTIME in the last 168 hours.  No results for input(s): DDIMER in the last 72 hours.  Cardiac Enzymes No results for input(s): CKMB, TROPONINI, MYOGLOBIN in the last 168 hours.  Invalid input(s): CK ------------------------------------------------------------------------------------------------------------------ No results found for: BNP   Izic Stfort M.D on 07/27/2016 at 2:42 PM  Between 7am to 7pm - Pager - [phone]  After 7pm go to [email or web address] - password [email or web address]  [facility] -  Office  [phone]   Voice Recognition Viviann Spare dictation system was used to create this note, attempts have been made to correct errors. Please contact the author with questions and/or clarifications.

## 2016-07-27 NOTE — Anesthesia Postprocedure Evaluation (Signed)
Anesthesia Post Note  Patient: Jenna Woods  Procedure(s) Performed: Procedure(s) (LRB): COLONOSCOPY WITH PROPOFOL (N/A)  Patient location during evaluation: PACU Anesthesia Type: MAC Level of consciousness: awake and patient cooperative Pain management: pain level controlled Vital Signs Assessment: post-procedure vital signs reviewed and stable Respiratory status: spontaneous breathing, nonlabored ventilation and respiratory function stable Cardiovascular status: blood pressure returned to baseline Postop Assessment: no signs of nausea or vomiting Anesthetic complications: no     Last Vitals:  Vitals:   07/27/16 1528 07/27/16 1600  BP: 122/64 (!) 106/51  Pulse:    Resp: 20 (!) 26  Temp: 36.6 C 36.9 C    Last Pain:  Vitals:   07/27/16 1528  TempSrc: Oral  PainSc:                  Lucrezia Dehne J

## 2016-07-27 NOTE — Progress Notes (Signed)
Pts hemoglobin and hematocrit dropped to 7.5 and 23.1. Dr. Shanon Brow paged and made aware of drop. Awaiting orders.

## 2016-07-28 ENCOUNTER — Inpatient Hospital Stay (HOSPITAL_COMMUNITY): Payer: Medicare Other

## 2016-07-28 ENCOUNTER — Encounter (HOSPITAL_COMMUNITY): Payer: Self-pay

## 2016-07-28 LAB — TYPE AND SCREEN
ABO/RH(D): AB POS
Antibody Screen: NEGATIVE
UNIT DIVISION: 0
Unit division: 0
Unit division: 0

## 2016-07-28 LAB — GLUCOSE, CAPILLARY
GLUCOSE-CAPILLARY: 72 mg/dL (ref 65–99)
GLUCOSE-CAPILLARY: 92 mg/dL (ref 65–99)
Glucose-Capillary: 53 mg/dL — ABNORMAL LOW (ref 65–99)
Glucose-Capillary: 74 mg/dL (ref 65–99)
Glucose-Capillary: 122 mg/dL — ABNORMAL HIGH (ref 65–99)

## 2016-07-28 LAB — BPAM RBC
BLOOD PRODUCT EXPIRATION DATE: 201805232359
BLOOD PRODUCT EXPIRATION DATE: 201805232359
Blood Product Expiration Date: 201805232359
ISSUE DATE / TIME: 201805231420
ISSUE DATE / TIME: 201805231008
Unit Type and Rh: 6200
Unit Type and Rh: 6200
Unit Type and Rh: 6200

## 2016-07-28 LAB — MAGNESIUM: Magnesium: 1.8 mg/dL (ref 1.7–2.4)

## 2016-07-28 LAB — CBC
HCT: 28.1 % — ABNORMAL LOW (ref 36.0–46.0)
Hemoglobin: 9.5 g/dL — ABNORMAL LOW (ref 12.0–15.0)
MCH: 30.4 pg (ref 26.0–34.0)
MCHC: 33.8 g/dL (ref 30.0–36.0)
MCV: 89.8 fL (ref 78.0–100.0)
PLATELETS: 255 10*3/uL (ref 150–400)
RBC: 3.13 MIL/uL — ABNORMAL LOW (ref 3.87–5.11)
RDW: 14.5 % (ref 11.5–15.5)
WBC: 8.7 10*3/uL (ref 4.0–10.5)

## 2016-07-28 LAB — HEMOGLOBIN AND HEMATOCRIT, BLOOD
HCT: 26.3 % — ABNORMAL LOW (ref 36.0–46.0)
HEMATOCRIT: 26.6 % — AB (ref 36.0–46.0)
Hemoglobin: 8.2 g/dL — ABNORMAL LOW (ref 12.0–15.0)
Hemoglobin: 9 g/dL — ABNORMAL LOW (ref 12.0–15.0)

## 2016-07-28 MED ORDER — HEPARIN SOD (PORK) LOCK FLUSH 100 UNIT/ML IV SOLN
INTRAVENOUS | Status: AC
  Filled 2016-07-28: qty 5

## 2016-07-28 MED ORDER — DEXTROSE 50 % IV SOLN
INTRAVENOUS | Status: AC
  Filled 2016-07-28: qty 50

## 2016-07-28 MED ORDER — TECHNETIUM TC 99M-LABELED RED BLOOD CELLS IV KIT
20.0000 | PACK | Freq: Once | INTRAVENOUS | Status: AC | PRN
  Administered 2016-07-28: 18 via INTRAVENOUS

## 2016-07-28 NOTE — Anesthesia Postprocedure Evaluation (Signed)
Anesthesia Post Note  Patient: Jenna Woods  Procedure(s) Performed: Procedure(s) (LRB): COLONOSCOPY WITH PROPOFOL (N/A)  Patient location during evaluation: Nursing Unit Anesthesia Type: MAC Level of consciousness: awake and alert, oriented and patient cooperative Pain management: pain level controlled Vital Signs Assessment: post-procedure vital signs reviewed and stable Respiratory status: spontaneous breathing, nonlabored ventilation and respiratory function stable Cardiovascular status: blood pressure returned to baseline Postop Assessment: no signs of nausea or vomiting Anesthetic complications: no     Last Vitals:  Vitals:   07/27/16 1831 07/28/16 0555  BP: 131/77 112/73  Pulse: (!) 58 78  Resp:  20  Temp:  37 C    Last Pain:  Vitals:   07/28/16 0555  TempSrc: Oral  PainSc:                  Myshawn Chiriboga J

## 2016-07-28 NOTE — Progress Notes (Signed)
  Subjective:  Patient feels much better today. She has not passed any blood per rectum. She denies abdominal pain. She is hungry.   Objective: Blood pressure 130/82, pulse 92, temperature 98.4 F (36.9 C), temperature source Oral, resp. rate 16, height 5\' 7"  (1.702 m), weight 229 lb 1.6 oz (103.9 kg), SpO2 100 %. Patient is alert and in no acute distress. Abdomen is full but soft and nontender without organomegaly or masses.  No LE edema or clubbing noted.  Labs/studies Results:   Recent Labs  07/26/16 0617  07/27/16 0645 07/27/16 1936 07/28/16 0446 07/28/16 1351  WBC 6.9  --  10.4  --  8.7  --   HGB 9.7*  < > 7.2* 10.4* 9.5* 8.2*  HCT 29.4*  < > 22.7* 30.2* 28.1* 26.6*  PLT 292  --  282  --  255  --   < > = values in this interval not displayed.  BMET   Recent Labs  07/26/16 0617 07/27/16 0645  NA 138 140  K 4.1 4.6  CL 109 112*  CO2 25 20*  GLUCOSE 110* 110*  BUN 10 17  CREATININE 0.66 0.66  CALCIUM 8.0* 8.1*      Recent Labs  07/27/16 1936  LABPROT 13.5  INR 1.03     Assessment:  #1.Lower GI bleed most likely secondary to colonic diverticulosis diagnosed in June 2017 when she had screening colonoscopy. Colonoscopy yesterday aborted because of large clots in the rectum and sigmoid colon. She has stopped bleeding. No further workup unless bleeding recurs. #2. Anemia secondary to GI blood loss. Patient has received 2 units of PRBCs.   Recommendations:  Diet advanced to heart healthy diet. CBC in a.m.

## 2016-07-28 NOTE — Progress Notes (Signed)
Pt returned back on the floor and IV fluids restarted.

## 2016-07-28 NOTE — Progress Notes (Signed)
Inpatient Diabetes Program Recommendations  AACE/ADA: New Consensus Statement on Inpatient Glycemic Control (2015)  Target Ranges:  Prepandial:   less than 140 mg/dL      Peak postprandial:   less than 180 mg/dL (1-2 hours)      Critically ill patients:  140 - 180 mg/dL  Results for VANEZA, PICKART (MRN [id number]) as of 07/28/2016 08:03  Ref. Range 07/27/2016 07:37 07/27/2016 10:51 07/27/2016 14:53 07/27/2016 16:05 07/27/2016 17:07 07/27/2016 18:35 07/27/2016 22:28  Glucose-Capillary Latest Ref Range: 65 - 99 mg/dL 78 102 (H) 52 (L) 93 56 (L) 77 92   Results for ARTURO, FREUNDLICH (MRN [id number]) as of 07/28/2016 08:03  Ref. Range 06/30/2016 07:32  Hemoglobin A1C Latest Ref Range: <5.7 % 5.5   Review of Glycemic Control  Diabetes history: Prediabetes Outpatient Diabetes medications: Metformin 500 mg QAM Current orders for Inpatient glycemic control: Novolog 0-9 units TID with meals  Inpatient Diabetes Program Recommendations: Correction (SSI): Noted CBG down to 52 mg/dl on 07/27/16. Patient is ordered Novolog correction scale but has not received any insulin since being admitted. MD may want to consider discontinuing Novolog correction scale. HgbA1C: A1C 5.5% on 06/30/16 indicating an average glucose of 111 mg/dl.   Thanks, Barnie Alderman, RN, MSN, CDE Diabetes Coordinator Inpatient Diabetes Program [phone] (Team Pager from 8am to 5pm)

## 2016-07-28 NOTE — Progress Notes (Signed)
Patient Demographics:    Jenna Woods, is a 56 y.o. female, DOB - 1960-07-22, UTM:[id number]  Admit date - 07/25/2016   Admitting Physician Kathie Dike, MD  Outpatient Primary MD for the patient is Fayrene Helper, MD  LOS - 3   Chief Complaint  Patient presents with  . Near Syncope        Subjective:    Jenna Woods today has no fevers, no emesis,  No chest pain,  No BM, no abd pain, Tolerating clear liquid diet well   Assessment  & Plan :    Active Problems:   Hypertension   GERD (gastroesophageal reflux disease)   Scleroderma (HCC)   IGT (impaired glucose tolerance)   Depression with anxiety   Lower GI bleed   Syncope and collapse   Rectal bleeding   GI bleed  Brief Narrative: 56 year old female admitted with rectal bleeding and syncope. Likely diverticular bleeding. Was transfused 2 units of packed cells on 07/27/2016, patient had colonoscopy on 07/27/2016 with evidence of old blood, Jenna Woods 07/28/2016 without Active bleeding  Plan:-  1)Rectal Bleeding-  Suspect recurrent diverticular bleed. Gastroenterology Input appreciated, s/p colonoscopy on 07/27/2016 with evidence of old blood but no fresh bleeding, Jenna Tag scan on 07/28/2016 without active bleeding. Hemoglobin is above 9 post transfusion  of 2 units of packed cells on 07/27/16.   2)Acute Blood Loss Anemia- please see #1 above  3)Syncope- suspect secondary to volume depletion in the setting of #1 #2 above, hemodynamics improved with hydration. Patient is largely asymptomatic at this time with stable H&H  4)HYperglycemia- Allow some permissive Hyperglycemia rather than risk life-threatening hypoglycemia in a patient with unreliable oral intake. Continue to hold metformin, Use Novolog/Humalog Sliding scale insulin with Accu-Cheks/Fingersticks as ordered  5)Depression and anxiety- Continue on trazodone at home  dose of Xanax.  6)Hypertension- blood pressure stable off amlodipine  7)Scleroderma- She is not on any maintenance/immunosuppressivetherapy.  Code Status : full  Disposition Plan  : TBD  Consults  :  Gi   DVT Prophylaxis  :    SCDs    Lab Results  Component Value Date   PLT 255 07/28/2016    Inpatient Medications  Scheduled Meds: . ALPRAZolam  1 mg Oral TID  . dextrose      . fesoterodine  8 mg Oral Daily  . heparin lock flush      . heparin lock flush      . insulin aspart  0-9 Units Subcutaneous TID WC  . montelukast  10 mg Oral QHS  . pantoprazole  40 mg Oral Daily   Continuous Infusions: . sodium chloride    . sodium chloride    . 0.9 % NaCl with KCl 20 mEq / L 50 mL/hr at 07/28/16 1143   PRN Meds:.acetaminophen **OR** acetaminophen, diphenhydrAMINE **OR** diphenhydrAMINE, ondansetron **OR** ondansetron (ZOFRAN) IV, traZODone    Anti-infectives    None        Objective:   Vitals:   07/27/16 1831 07/27/16 2017 07/28/16 0555 07/28/16 1416  BP: 131/77  112/73 130/82  Pulse: (!) 58  78 92  Resp:   20 16  Temp:   98.6 F (37 C) 98.4 F (36.9 C)  TempSrc:   Oral Oral  SpO2:  95% 99% 100%  Weight:      Height:        Wt Readings from Last 3 Encounters:  07/25/16 103.9 kg (229 lb 1.6 oz)  07/04/16 103.4 kg (228 lb)  07/02/16 103 kg (227 lb)     Intake/Output Summary (Last 24 hours) at 07/28/16 1426 Last data filed at 07/28/16 1416  Gross per 24 hour  Intake             1715 ml  Output             1900 ml  Net             -185 ml     Physical Exam  Gen:- Awake Alert,  In no apparent distress  HEENT:- Jenna Woods, No sclera icterus Neck-Supple Neck,No JVD,.  Lungs-  CTAB  CV- S1, S2 normal Abd-  +ve B.Sounds, Abd Soft, No tenderness,    Extremity/Skin:- No  edema,       Data Review:   Micro Results Recent Results (from the past 240 hour(s))  MRSA PCR Screening     Status: None   Collection Time: 07/25/16 11:05 AM  Result  Value Ref Range Status   MRSA by PCR NEGATIVE NEGATIVE Final    Comment:        The GeneXpert MRSA Assay (FDA approved for NASAL specimens only), is one component of a comprehensive MRSA colonization surveillance program. It is not intended to diagnose MRSA infection nor to guide or monitor treatment for MRSA infections.    Radiology Reports Nm Gi Blood Loss  Result Date: 07/28/2016 CLINICAL DATA:  GI bleeding since 07/25/2016, had bleeding in October 2017 with polyps identified at endoscopy EXAM: NUCLEAR MEDICINE GASTROINTESTINAL BLEEDING SCAN TECHNIQUE: Sequential abdominal images were obtained following intravenous administration of Tc-28m labeled red blood cells. RADIOPHARMACEUTICALS:  18 mCi Tc-3m pertechnetate in-vitro labeled autologous red cells. COMPARISON:  None FINDINGS: Imaging performed for 90 minutes. Normal blood pool distribution of labeled red cells. No abnormal gastrointestinal localization of red cells identified to suggest active GI bleeding. IMPRESSION: Negative GI bleeding scan through 90 minutes. Electronically Signed   By: Jenna Dana M.D.   On: 07/28/2016 11:02     CBC  Recent Labs Lab 07/25/16 0649  07/26/16 0617  07/26/16 1836 07/27/16 0047 07/27/16 0645 07/27/16 1936 07/28/16 0446  WBC 14.7*  --  6.9  --   --   --  10.4  --  8.7  HGB 12.5  < > 9.7*  < > 9.8* 7.5* 7.2* 10.4* 9.5*  HCT 37.6  < > 29.4*  < > 29.2* 23.1* 22.7* 30.2* 28.1*  PLT 328  --  292  --   --   --  282  --  255  MCV 91.0  --  91.3  --   --   --  93.8  --  89.8  MCH 30.3  --  30.1  --   --   --  29.8  --  30.4  MCHC 33.2  --  33.0  --   --   --  31.7  --  33.8  RDW 13.5  --  13.6  --   --   --  13.2  --  14.5  < > = values in this interval not displayed.  Chemistries   Recent Labs Lab 07/25/16 0649 07/26/16 0617 07/27/16 0645 07/28/16 0446  NA 139 138 140  --   K 3.6 4.1 4.6  --  CL 107 109 112*  --   CO2 25 25 20*  --   GLUCOSE 157* 110* 110*  --   BUN 11 10 17   --    CREATININE 0.90 0.66 0.66  --   CALCIUM 8.7* 8.0* 8.1*  --   MG  --   --   --  1.8  AST 15  --   --   --   ALT 12*  --   --   --   ALKPHOS 63  --   --   --   BILITOT 0.2*  --   --   --    ------------------------------------------------------------------------------------------------------------------ No results for input(s): CHOL, HDL, LDLCALC, TRIG, CHOLHDL, LDLDIRECT in the last 72 hours.  Lab Results  Component Value Date   HGBA1C 5.5 06/30/2016   ------------------------------------------------------------------------------------------------------------------ No results for input(s): TSH, T4TOTAL, T3FREE, THYROIDAB in the last 72 hours.  Invalid input(s): FREET3 ------------------------------------------------------------------------------------------------------------------ No results for input(s): VITAMINB12, FOLATE, FERRITIN, TIBC, IRON, RETICCTPCT in the last 72 hours.  Coagulation profile  Recent Labs Lab 07/27/16 1936  INR 1.03    No results for input(s): DDIMER in the last 72 hours.  Cardiac Enzymes No results for input(s): CKMB, TROPONINI, MYOGLOBIN in the last 168 hours.  Invalid input(s): CK ------------------------------------------------------------------------------------------------------------------ No results found for: BNP   Jenna Woods M.D on 07/28/2016 at 2:26 PM  Between 7am to 7pm - Pager - [phone]  After 7pm go to [email or web address] - password [email or web address]  [facility] -  Office  [phone]   Voice Recognition Viviann Spare dictation system was used to create this note, attempts have been made to correct errors. Please contact the author with questions and/or clarifications.

## 2016-07-28 NOTE — Progress Notes (Signed)
Pt being transported down to radiology via transport team.

## 2016-07-28 NOTE — Addendum Note (Signed)
Addendum  created 07/28/16 1353 by Charmaine Downs, CRNA   Sign clinical note

## 2016-07-29 LAB — HEMOGLOBIN AND HEMATOCRIT, BLOOD
HEMATOCRIT: 27.4 % — AB (ref 36.0–46.0)
Hemoglobin: 9.3 g/dL — ABNORMAL LOW (ref 12.0–15.0)

## 2016-07-29 LAB — BASIC METABOLIC PANEL
ANION GAP: 7 (ref 5–15)
BUN: 9 mg/dL (ref 6–20)
CO2: 24 mmol/L (ref 22–32)
Calcium: 8.6 mg/dL — ABNORMAL LOW (ref 8.9–10.3)
Chloride: 109 mmol/L (ref 101–111)
Creatinine, Ser: 0.64 mg/dL (ref 0.44–1.00)
GFR calc non Af Amer: >60 mL/min (ref >60)
Glucose, Bld: 98 mg/dL (ref 65–99)
Potassium: 4 mmol/L (ref 3.5–5.1)
SODIUM: 140 mmol/L (ref 135–145)

## 2016-07-29 LAB — CBC
HEMATOCRIT: 29 % — AB (ref 36.0–46.0)
Hemoglobin: 9.8 g/dL — ABNORMAL LOW (ref 12.0–15.0)
MCH: 30.7 pg (ref 26.0–34.0)
MCHC: 33.8 g/dL (ref 30.0–36.0)
MCV: 90.9 fL (ref 78.0–100.0)
Platelets: 307 10*3/uL (ref 150–400)
RBC: 3.19 MIL/uL — ABNORMAL LOW (ref 3.87–5.11)
RDW: 14.4 % (ref 11.5–15.5)
WBC: 7.8 10*3/uL (ref 4.0–10.5)

## 2016-07-29 LAB — GLUCOSE, CAPILLARY
GLUCOSE-CAPILLARY: 79 mg/dL (ref 65–99)
GLUCOSE-CAPILLARY: 142 mg/dL — AB (ref 65–99)
Glucose-Capillary: 97 mg/dL (ref 65–99)

## 2016-07-29 MED ORDER — FERROUS SULFATE 325 (65 FE) MG PO TBEC: 325.0000 mg | DELAYED_RELEASE_TABLET | Freq: Two times a day (BID) | ORAL | 1 refills | Status: DC

## 2016-07-29 MED ORDER — ASPIRIN EC 81 MG PO TBEC: 81.0000 mg | DELAYED_RELEASE_TABLET | Freq: Every day | ORAL | Status: DC

## 2016-07-29 NOTE — Progress Notes (Signed)
Pt IV removed per NT, tolerated well.  Reviewed discharge instructions with pt and answered all questions at this time.

## 2016-07-29 NOTE — Progress Notes (Signed)
Patient has no complaints. She has good appetite. She is ambulating in the room without lightheadedness or postural symptoms. She had a bowel movement earlier today past tox stool but no fresh blood noted. Abdomen is full but soft and nontender without organomegaly or masses. Hemoglobin is 9.8 g.  Assessment: Lower GI bleed most likely secondary to colonic diverticulosis documented on colonoscopy of June 2017. No evidence of recurrent bleed. Agree with plans for discharge if next hemoglobin stable. No aspirin for at least 2 weeks.

## 2016-07-29 NOTE — Discharge Instructions (Signed)
1)Avoid ibuprofen/Advil/Aleve/Motrin/Goody Powders/Naproxen/BC powders as these will make you more likely to bleed 2) take medications as prescribed 3) repeat blood count with your doctor 3 to 4 days 4)No Aspirin for at least 2 weeks 5) call if any bloody stools or if  black stool 6) iron tablets may make your stool look black

## 2016-07-29 NOTE — Discharge Summary (Signed)
Jenna Woods, is a 56 y.o. female  DOB 1960/04/15  MRN [id number].  Admission date:  07/25/2016  Admitting Physician  Kathie Dike, MD  Discharge Date:  07/29/2016   Primary MD  Fayrene Helper, MD  Recommendations for primary care physician for things to follow:   Repeat CBC in 3 to 4 days   Admission Diagnosis  Lower GI bleed [K92.2]   Discharge Diagnosis  Lower GI bleed [K92.2]    Active Problems:   Hypertension   GERD (gastroesophageal reflux disease)   Scleroderma (HCC)   IGT (impaired glucose tolerance)   Depression with anxiety   Lower GI bleed   Syncope and collapse   Rectal bleeding   GI bleed      Past Medical History:  Diagnosis Date  . Allergic reaction   . Allergy    perrenial   . Anxiety   . Arthritis   . Depression    h/o suicidal ideation in 2011  . Depression with anxiety   . Diverticulosis of colon 12/19/2015  . Diverticulosis of colon with hemorrhage 12/19/2015  . GERD (gastroesophageal reflux disease)   . Hypertension 1996  . Obesity   . Peripheral vascular disease ([place])   . Prediabetes 2013  . Scleroderma ([place]) 1998  . Systemic lupus erythematosus ([place]) 1998   Treated at [facility]  . Tobacco abuse, in remission    10-pack-years discontinued in 2007  . Urinary frequency     Past Surgical History:  Procedure Laterality Date  . ABDOMINAL HYSTERECTOMY  1990   Neoplasm  . CESAREAN SECTION     x2  . COLONOSCOPY  2005  . COLONOSCOPY WITH PROPOFOL N/A 08/28/2015   Dr. Laural Golden: scattered medium-mouth diverticula entire colon, external hemorrhoids  . FINGER AMPUTATION     Third finger, bilateral, distal  . NASAL SINUS SURGERY  09/06/2011   Procedure: ENDOSCOPIC SINUS SURGERY;  Surgeon: Ascencion Dike, MD;  Location: [place];  Service: ENT;  Laterality: N/A;  Nasal cerebrospinal fluid leak repair with Left temporalis fascia graft and Left  Ear cartilage graft  . VASCULAR SURGERY     Hands, bilaterally, [facility]; right hand partial amputation of middle finger.       HPI  from the history and physical done on the day of admission:     Chief Complaint: rectal bleeding  HPI: Jenna Woods is a 56 y.o. female with medical history significant of diverticular bleeding in the past, comes to the hospital today after a syncopal episode. Patient woke up this morning at approximately 4:30 AM with abdominal cramping. She went to the bathroom and noticed dark blood in the toilet after having a bowel movement. She returned to bed and shortly thereafter needed to have another bowel movement. She had abdominal cramping again. She went to the bathroom and after sitting on the toilet having a bowel movement, she passed out. She does not know how long he lost consciousness. Denies any head trauma. She noticed that she had blood  in her stool again. She did not have any chest pain or shortness of breath. She reports her by mouth intake has been poor the last few days. She has had a headache the past 2 days. She denies taking any NSAIDs. She is not had any vomiting.  ED Course: She was noted to be mildly hypotensive which responded to IV fluids. Hemoglobin was near baseline. Rectal exam was positive for blood. She's been referred for admission.  Review of Systems: As per HPI otherwise 10 point review of systems negative.      Hospital Course:   Brief Narrative: 56 year old female admitted with rectal bleeding and syncope. Likely diverticular bleeding (Colonoscopic evaluation from June 2017 previously confirmed colonic diverticulosis). She Was transfused 2 units of packed cells on 07/27/2016, patient had colonoscopy on 07/27/2016 with evidence of old blood, RBC Tag Scan on 07/28/2016 without evidence of Active bleeding. Hemoglobin stayed above 9    Plan:- 1)Rectal Bleeding- Suspect recurrent diverticular bleed. Gastroenterology  Input appreciated, s/p colonoscopy on 07/27/2016 with evidence of old blood but no fresh bleeding, RBC Tag scan on 07/28/2016 without active bleeding. Hemoglobin is above 9 post transfusion  of 2 units of packed cells on 07/27/16. Please see brief narrative above  2)Acute Blood Loss Anemia- please see #1 above  3)Syncope- suspect secondary to volume depletion in the setting of #1 #2 above, hemodynamics improved with hydration. Patient is asymptomatic at this time with stable H&H  4)HYperglycemia- okay to restart home regimen  5)Depression and anxiety-Continue on trazodone at home dose of Xanax.  6)Hypertension- okay to restart amlodipine  7)Scleroderma-She is not on any maintenance/immunosuppressivetherapy.   Discharge Condition: stable  Follow UP-  with PCP for repeat CBC in 3-4 days   Consults obtained - Gi and IR  Diet and Activity recommendation:  As advised  Discharge Instructions    1)Avoid ibuprofen/Advil/Aleve/Motrin/Goody Powders/Naproxen/BC powders as these will make you more likely to bleed 2) take medications as prescribed 3) repeat blood count with your doctor 3 to 4 days 4)No Aspirin for at least 2 weeks 5) call if any bloody stools or if  black stool 6) iron tablets may make your stool look black* Discharge Instructions    Call MD for:  difficulty breathing, headache or visual disturbances    Complete by:  As directed    Call MD for:  persistant dizziness or light-headedness    Complete by:  As directed    Call MD for:  persistant nausea and vomiting    Complete by:  As directed    Call MD for:  severe uncontrolled pain    Complete by:  As directed    Call MD for:  temperature >100.4    Complete by:  As directed    Diet - low sodium heart healthy    Complete by:  As directed    Discharge instructions    Complete by:  As directed    1)Avoid ibuprofen/Advil/Aleve/Motrin/Goody Powders/Naproxen/BC powders as these will make you more likely to  bleed 2) take medications as prescribed 3) repeat blood count with your doctor 3 to 4 days 4)No Aspirin for at least 2 weeks 5) call if any bloody stools or if  black stool 6) iron tablets may make your stool look black   Increase activity slowly    Complete by:  As directed         Discharge Medications     Allergies as of 07/29/2016      Reactions   Bee  Venom Anaphylaxis   Sesame Oil Anaphylaxis, Swelling   (Sesame seed)   Aspirin    Diverticulosis with bleed   Molds & Smuts    Codeine Itching, Rash      Medication List    TAKE these medications   ALPRAZolam 1 MG tablet Commonly known as:  XANAX Take 1 tablet (1 mg total) by mouth 3 (three) times daily.   amLODipine 10 MG tablet Commonly known as:  NORVASC TAKE ONE (1) TABLET BY MOUTH EVERY DAY   aspirin EC 81 MG tablet Take 1 tablet (81 mg total) by mouth daily. Resume in 2 weeks What changed:  additional instructions   CALCIUM 1200 1200-1000 MG-UNIT Chew Chew 1 tablet by mouth daily.   ferrous sulfate 325 (65 FE) MG EC tablet Take 1 tablet (325 mg total) by mouth 2 (two) times daily with a meal.   metFORMIN 500 MG tablet Commonly known as:  GLUCOPHAGE Take 500 mg by mouth daily with breakfast.   montelukast 10 MG tablet Commonly known as:  SINGULAIR Take 1 tablet (10 mg total) by mouth at bedtime.   pantoprazole 40 MG tablet Commonly known as:  PROTONIX Take 1 tablet (40 mg total) by mouth daily.   TOVIAZ 8 MG Tb24 tablet Generic drug:  fesoterodine TAKE ONE (1) TABLET EACH DAY   traZODone 100 MG tablet Commonly known as:  DESYREL Take 1 tablet (100 mg total) by mouth at bedtime as needed for sleep.       Major procedures and Radiology Reports - PLEASE review detailed and final reports for all details, in brief -    Nm Gi Blood Loss  Result Date: 07/28/2016 CLINICAL DATA:  GI bleeding since 07/25/2016, had bleeding in October 2017 with polyps identified at endoscopy EXAM: [place]: Sequential abdominal images were obtained following intravenous administration of Tc-20m labeled red blood cells. RADIOPHARMACEUTICALS:  18 mCi Tc-51m pertechnetate in-vitro labeled autologous red cells. COMPARISON:  None FINDINGS: Imaging performed for 90 minutes. Normal blood pool distribution of labeled red cells. No abnormal gastrointestinal localization of red cells identified to suggest active GI bleeding. IMPRESSION: Negative GI bleeding scan through 90 minutes. Electronically Signed   By: Lavonia Dana M.D.   On: 07/28/2016 11:02    Micro Results   Recent Results (from the past 240 hour(s))  MRSA PCR Screening     Status: None   Collection Time: 07/25/16 11:05 AM  Result Value Ref Range Status   MRSA by PCR NEGATIVE NEGATIVE Final    Comment:        The GeneXpert MRSA Assay (FDA approved for NASAL specimens only), is one component of a comprehensive MRSA colonization surveillance program. It is not intended to diagnose MRSA infection nor to guide or monitor treatment for MRSA infections.        Today   Subjective    [facility] today has no new complaints, no abdominal pain no fevers no emesis, ambulating without dizziness or dyspnea on exertion, tolerating solid food well. Had one somewhat dark BM  (suspect evacuation of old blood)        Patient has been seen and examined prior to discharge   Objective   Blood pressure 120/67, pulse 88, temperature 98.4 F (36.9 C), temperature source Oral, resp. rate 16, height 5\' 7"  (1.702 m), weight 103.9 kg (229 lb 1.6 oz), SpO2 100 %.   Intake/Output Summary (Last 24 hours) at 07/29/16 1435 Last data filed at 07/29/16 1123  Gross per 24 hour  Intake          4547.67 ml  Output              800 ml  Net          3747.67 ml    Exam Gen:- Awake  In no apparent distress  HEENT:- [place].AT,   Neck-Supple Neck,No JVD,  Lungs- mostly clear  CV- S1, S2 normal Abd-  +ve B.Sounds, Abd  Soft, No tenderness,    Extremity/Skin:- Intact peripheral pulses     Data Review   CBC w Diff: Lab Results  Component Value Date   WBC 7.8 07/29/2016   HGB 9.3 (L) 07/29/2016   HCT 27.4 (L) 07/29/2016   PLT 307 07/29/2016   LYMPHOPCT 18 12/19/2015   MONOPCT 5 12/19/2015   EOSPCT 3 12/19/2015   BASOPCT 0 12/19/2015    CMP: Lab Results  Component Value Date   NA 140 07/29/2016   K 4.0 07/29/2016   CL 109 07/29/2016   CO2 24 07/29/2016   BUN 9 07/29/2016   CREATININE 0.64 07/29/2016   CREATININE 0.71 06/30/2016   PROT 6.0 (L) 07/25/2016   ALBUMIN 3.1 (L) 07/25/2016   BILITOT 0.2 (L) 07/25/2016   ALKPHOS 63 07/25/2016   AST 15 07/25/2016   ALT 12 (L) 07/25/2016  .   Total Discharge time is about 33 minutes  Devlynn Knoff M.D on 07/29/2016 at 2:35 PM  [facility]   Office  [phone]  Voice Recognition Viviann Spare dictation system was used to create this note, attempts have been made to correct errors. Please contact the author with questions and/or clarifications.

## 2016-07-29 NOTE — Care Management Important Message (Signed)
Important Message  Patient Details  Name: Jenna Woods MRN: [id number] Date of Birth: [birth year]   Medicare Important Message Given:  Yes    Sherald Barge, RN 07/29/2016, 11:38 AM

## 2016-08-02 ENCOUNTER — Encounter (HOSPITAL_COMMUNITY): Payer: Self-pay | Admitting: Internal Medicine

## 2016-08-03 ENCOUNTER — Telehealth: Payer: Self-pay | Admitting: Family Medicine

## 2016-08-03 ENCOUNTER — Other Ambulatory Visit (HOSPITAL_COMMUNITY)
Admission: RE | Admit: 2016-08-03 | Discharge: 2016-08-03 | Disposition: A | Discharge status: Home / Self Care | Payer: Medicare Other | Source: Ambulatory Visit | Attending: Family Medicine | Admitting: Family Medicine

## 2016-08-03 DIAGNOSIS — K625 Hemorrhage of anus and rectum: Secondary | ICD-10-CM | POA: Insufficient documentation

## 2016-08-03 LAB — CBC WITH DIFFERENTIAL/PLATELET
BASOS ABS: 0 10*3/uL (ref 0.0–0.1)
Basophils Relative: 1 %
Eosinophils Absolute: 0.4 10*3/uL (ref 0.0–0.7)
Eosinophils Relative: 5 %
HEMATOCRIT: 32 % — AB (ref 36.0–46.0)
HEMOGLOBIN: 10.5 g/dL — AB (ref 12.0–15.0)
Lymphocytes Relative: 22 %
Lymphs Abs: 1.9 10*3/uL (ref 0.7–4.0)
MCH: 30.6 pg (ref 26.0–34.0)
MCHC: 32.8 g/dL (ref 30.0–36.0)
MCV: 93.3 fL (ref 78.0–100.0)
MONO ABS: 0.4 10*3/uL (ref 0.1–1.0)
Monocytes Relative: 5 %
Neutro Abs: 5.7 10*3/uL (ref 1.7–7.7)
Neutrophils Relative %: 67 %
Platelets: 477 10*3/uL — ABNORMAL HIGH (ref 150–400)
RBC: 3.43 MIL/uL — AB (ref 3.87–5.11)
RDW: 14.7 % (ref 11.5–15.5)
WBC: 8.5 10*3/uL (ref 4.0–10.5)

## 2016-08-03 NOTE — Telephone Encounter (Signed)
Patient left message on voice mail @ 9:31 stating that she was returning a call to our office and she could be reached @ this #  [phone]

## 2016-08-04 ENCOUNTER — Ambulatory Visit: Payer: Self-pay | Admitting: Family Medicine

## 2016-08-04 NOTE — Telephone Encounter (Signed)
Pt came to collect her lab order

## 2016-08-09 ENCOUNTER — Other Ambulatory Visit: Payer: Self-pay | Admitting: Family Medicine

## 2016-09-05 DIAGNOSIS — I10 Essential (primary) hypertension: Secondary | ICD-10-CM

## 2016-09-09 ENCOUNTER — Other Ambulatory Visit: Payer: Self-pay | Admitting: Family Medicine

## 2016-09-16 ENCOUNTER — Telehealth: Payer: Self-pay | Admitting: Family Medicine

## 2016-09-16 MED ORDER — METFORMIN HCL 500 MG PO TABS: 500.0000 mg | ORAL_TABLET | Freq: Every day | ORAL | 0 refills | Status: DC

## 2016-09-16 NOTE — Telephone Encounter (Signed)
[facility] calling (2nd request) for Metformin (90 day supply)

## 2016-09-20 ENCOUNTER — Ambulatory Visit: Payer: Self-pay | Admitting: Family Medicine

## 2016-09-20 ENCOUNTER — Ambulatory Visit (INDEPENDENT_AMBULATORY_CARE_PROVIDER_SITE_OTHER): Payer: Medicare Other | Admitting: Family Medicine

## 2016-09-20 ENCOUNTER — Encounter: Payer: Self-pay | Admitting: Family Medicine

## 2016-09-20 VITALS — BP 118/80 | HR 67 | Resp 16 | Ht 67.0 in | Wt 224.0 lb

## 2016-09-20 DIAGNOSIS — R7302 Impaired glucose tolerance (oral): Secondary | ICD-10-CM | POA: Diagnosis not present

## 2016-09-20 DIAGNOSIS — I10 Essential (primary) hypertension: Secondary | ICD-10-CM

## 2016-09-20 DIAGNOSIS — N39498 Other specified urinary incontinence: Secondary | ICD-10-CM

## 2016-09-20 DIAGNOSIS — F418 Other specified anxiety disorders: Secondary | ICD-10-CM | POA: Diagnosis not present

## 2016-09-20 DIAGNOSIS — Z09 Encounter for follow-up examination after completed treatment for conditions other than malignant neoplasm: Secondary | ICD-10-CM

## 2016-09-20 DIAGNOSIS — M329 Systemic lupus erythematosus, unspecified: Secondary | ICD-10-CM

## 2016-09-20 DIAGNOSIS — Z0289 Encounter for other administrative examinations: Secondary | ICD-10-CM | POA: Diagnosis not present

## 2016-09-20 DIAGNOSIS — K922 Gastrointestinal hemorrhage, unspecified: Secondary | ICD-10-CM | POA: Diagnosis not present

## 2016-09-20 DIAGNOSIS — E559 Vitamin D deficiency, unspecified: Secondary | ICD-10-CM

## 2016-09-20 DIAGNOSIS — D539 Nutritional anemia, unspecified: Secondary | ICD-10-CM | POA: Diagnosis not present

## 2016-09-20 NOTE — Assessment & Plan Note (Signed)
Followed by psychiatry, markedly improved, controlled on current medication

## 2016-09-20 NOTE — Progress Notes (Signed)
Jenna Woods     MRN: [id number]      DOB: 1961/01/15   HPI Jenna Woods is here for follow up of recent hospitalization in May, for acute divetricular bleed, follow up of chronic conditions, and medical review as well as medication review and encounter for form completion for DMV form. 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 Denies sinus pressure, nasal congestion, ear pain or sore throat. Denies chest congestion, productive cough or wheezing. Denies chest pains, palpitations and leg swelling Denies abdominal pain, nausea, vomiting,diarrhea or constipation.   Denies dysuria, frequency, hesitancy or incontinence. Denies joint pain, swelling and limitation in mobility. Denies headaches, seizures, numbness, or tingling. Denies depression,c/o increased anxiety with recent hospitalization of her sister in law, denies  insomnia. Denies skin break down or rash.   PE  BP 118/80   Pulse 67   Resp 16   Ht 5\' 7"  (1.702 m)   Wt 224 lb (101.6 kg)   SpO2 94%   BMI 35.08 kg/m   Patient alert and oriented and in no cardiopulmonary distress.  HEENT: No facial asymmetry, EOMI,   oropharynx pink and moist.  Neck supple no JVD, no mass.  Chest: Clear to auscultation bilaterally.  CVS: S1, S2 no murmurs, no S3.Regular rate.  ABD: Soft non tender.   Ext: No edema  MS: Adequate ROM spine, shoulders, hips and knees.  Skin: Intact, no ulcerations or rash noted.  Psych: Good eye contact, normal affect. Memory intact not anxious or depressed appearing.  CNS: CN 2-12 intact, power,  normal throughout.no focal deficits noted.   [facility] discharge follow-up Hospitalized in May 2018, 2nd time in less than 1 year for severe diverticular bleed ,  despite repeated attempts first time being able to see her in the office. She did have necessary labs early after d/c which showed hemodynamic stability. Denies visible rectal bleed or black stool, weakness or fatigue. Clearly understands the need to avoid NSAIDS to reduce recurrence  Encounter for completion of form with patient Presents with DMV form required for reinstating regular drivers license . 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 Meds reconciled and form completed  Hypertension Controlled, no change in medication DASH diet and commitment to daily physical activity for a minimum of 30 minutes discussed and encouraged, as a part of hypertension management. The importance of attaining a healthy weight is also discussed.  BP/Weight 09/20/2016 07/29/2016 07/25/2016 07/04/2016 07/02/2016 04/18/2016 74/25/9563  Systolic BP 875 643 - 329 518 841 660  Diastolic BP 80 56 - 80 91 68 80  Wt. (Lbs) 224 - 229.1 228 227 231.04 230  BMI 35.08 - 35.88 36.8 34.52 37.29 37.12  Some encounter information is confidential and restricted. Go to Review Flowsheets activity to see all data.       Depression with anxiety Followed by psychiatry, markedly improved, controlled on current medication  IGT (impaired glucose tolerance) Discontinue metformin, normal hBA1C  Vitamin d deficiency Takes 1000 IU daily  Updated lab needed at/ before next visit.   Urinary incontinence  Controlled, no change in medication

## 2016-09-20 NOTE — Assessment & Plan Note (Signed)
Controlled, no change in medication  

## 2016-09-20 NOTE — Assessment & Plan Note (Signed)
Hospitalized in May 2018, 2nd time in less than 1 year for severe diverticular bleed , despite repeated attempts first time being able to see her in the office. She did have necessary labs early after d/c which showed hemodynamic stability. Denies visible rectal bleed or black stool, weakness or fatigue. Clearly understands the need to avoid NSAIDS to reduce recurrence

## 2016-09-20 NOTE — Assessment & Plan Note (Addendum)
Presents with DMV form required for reinstating regular drivers license . 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 Meds reconciled and form completed

## 2016-09-20 NOTE — Assessment & Plan Note (Signed)
Takes 1000 IU daily  Updated lab needed at/ before next visit.

## 2016-09-20 NOTE — Assessment & Plan Note (Signed)
Discontinue metformin, normal hBA1C

## 2016-09-20 NOTE — Patient Instructions (Signed)
F/u as before, call if you need me sooner  Fasting chem 7, CBc , iron and ferritin, TSh , vit D, 1 week before next visit  Completed form will be ready by next Monday  Thank you  for choosing [facility]. We consider it a privelige to serve you.  Delivering excellent health care in a caring and  compassionate way is our goal.  Partnering with you,  so that together we can achieve this goal is our strategy.   It is important that you exercise regularly at least 30 minutes 5 times a week. If you develop chest pain, have severe difficulty breathing, or feel very tired, stop exercising immediately and seek medical attention  Please work on good  health habits so that your health will improve. 1. Commitment to daily physical activity for 30 to 60  minutes, if you are able to do this.  2. Commitment to wise food choices. Aim for half of your  food intake to be vegetable and fruit, one quarter starchy foods, and one quarter protein. Try to eat on a regular schedule  3 meals per day, snacking between meals should be limited to vegetables or fruits or small portions of nuts. 64 ounces of water per day is generally recommended, unless you have specific health conditions, like heart failure or kidney failure where you will need to limit fluid intake.  3. Commitment to sufficient and a  good quality of physical and mental rest daily, generally between 6 to 8 hours per day.  WITH PERSISTANCE AND PERSEVERANCE, THE IMPOSSIBLE , BECOMES THE NORM!

## 2016-09-20 NOTE — Assessment & Plan Note (Signed)
Controlled, no change in medication DASH diet and commitment to daily physical activity for a minimum of 30 minutes discussed and encouraged, as a part of hypertension management. The importance of attaining a healthy weight is also discussed.  BP/Weight 09/20/2016 07/29/2016 07/25/2016 07/04/2016 07/02/2016 04/18/2016 16/12/9602  Systolic BP 540 981 - 191 478 295 621  Diastolic BP 80 56 - 80 91 68 80  Wt. (Lbs) 224 - 229.1 228 227 231.04 230  BMI 35.08 - 35.88 36.8 34.52 37.29 37.12  Some encounter information is confidential and restricted. Go to Review Flowsheets activity to see all data.

## 2016-10-04 ENCOUNTER — Ambulatory Visit: Payer: Self-pay | Admitting: Family Medicine

## 2016-10-10 ENCOUNTER — Ambulatory Visit (INDEPENDENT_AMBULATORY_CARE_PROVIDER_SITE_OTHER): Payer: Medicare Other | Admitting: Psychiatry

## 2016-10-10 ENCOUNTER — Encounter (HOSPITAL_COMMUNITY): Payer: Self-pay | Admitting: Psychiatry

## 2016-10-10 ENCOUNTER — Telehealth: Payer: Self-pay | Admitting: Family Medicine

## 2016-10-10 VITALS — BP 128/98 | HR 85 | Ht 67.0 in | Wt 224.0 lb

## 2016-10-10 DIAGNOSIS — F331 Major depressive disorder, recurrent, moderate: Secondary | ICD-10-CM | POA: Diagnosis not present

## 2016-10-10 DIAGNOSIS — G4700 Insomnia, unspecified: Secondary | ICD-10-CM

## 2016-10-10 DIAGNOSIS — Z813 Family history of other psychoactive substance abuse and dependence: Secondary | ICD-10-CM | POA: Diagnosis not present

## 2016-10-10 DIAGNOSIS — Z811 Family history of alcohol abuse and dependence: Secondary | ICD-10-CM | POA: Diagnosis not present

## 2016-10-10 DIAGNOSIS — Z810 Family history of intellectual disabilities: Secondary | ICD-10-CM | POA: Diagnosis not present

## 2016-10-10 DIAGNOSIS — Z87891 Personal history of nicotine dependence: Secondary | ICD-10-CM | POA: Diagnosis not present

## 2016-10-10 MED ORDER — ALPRAZOLAM 1 MG PO TABS: 1.0000 mg | ORAL_TABLET | Freq: Three times a day (TID) | ORAL | 3 refills | Status: DC

## 2016-10-10 MED ORDER — TRAZODONE HCL 100 MG PO TABS: 100.0000 mg | ORAL_TABLET | Freq: Every evening | ORAL | 3 refills | Status: DC | PRN

## 2016-10-10 NOTE — Telephone Encounter (Signed)
Patient called and left message on nurse line. She states that she has a DMV form to get her license. She needs to speak with someone about it. She states Dr. Harrington Challenger filled out her part but now there is something about her heart on the next part and she states she has not seen a cardiologist.  Callback# [phone]
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
## 2016-10-11 ENCOUNTER — Ambulatory Visit: Payer: Medicare Other

## 2016-10-11 NOTE — Telephone Encounter (Signed)
Called pt, no answer, unable to leave message. Was calling to inform Dr Moshe Cipro wants to d/c metformin

## 2016-10-12 NOTE — Telephone Encounter (Signed)
Pt was made aware at her last visit and d/c from the pharmacy

## 2016-12-13 DIAGNOSIS — M329 Systemic lupus erythematosus, unspecified: Secondary | ICD-10-CM | POA: Diagnosis not present

## 2016-12-13 DIAGNOSIS — D539 Nutritional anemia, unspecified: Secondary | ICD-10-CM | POA: Diagnosis not present

## 2016-12-13 DIAGNOSIS — I10 Essential (primary) hypertension: Secondary | ICD-10-CM | POA: Diagnosis not present

## 2016-12-13 DIAGNOSIS — E559 Vitamin D deficiency, unspecified: Secondary | ICD-10-CM | POA: Diagnosis not present

## 2016-12-14 LAB — CBC
HCT: 37.8 % (ref 35.0–45.0)
HEMOGLOBIN: 12.9 g/dL (ref 11.7–15.5)
MCH: 29.1 pg (ref 27.0–33.0)
MCHC: 34.1 g/dL (ref 32.0–36.0)
MCV: 85.3 fL (ref 80.0–100.0)
MPV: 9.8 fL (ref 7.5–12.5)
Platelets: 358 10*3/uL (ref 140–400)
RBC: 4.43 10*6/uL (ref 3.80–5.10)
RDW: 13 % (ref 11.0–15.0)
WBC: 8.8 10*3/uL (ref 3.8–10.8)

## 2016-12-14 LAB — BASIC METABOLIC PANEL WITH GFR
BUN: 16 mg/dL (ref 7–25)
CALCIUM: 9.1 mg/dL (ref 8.6–10.4)
CHLORIDE: 109 mmol/L (ref 98–110)
CO2: 26 mmol/L (ref 20–32)
Creat: 0.7 mg/dL (ref 0.50–1.05)
GFR, EST NON AFRICAN AMERICAN: 96 mL/min/{1.73_m2} (ref >=60)
GFR, Est African American: 111 mL/min/{1.73_m2} (ref >=60)
Glucose, Bld: 97 mg/dL (ref 65–99)
POTASSIUM: 4.3 mmol/L (ref 3.5–5.3)
Sodium: 140 mmol/L (ref 135–146)

## 2016-12-14 LAB — TSH: TSH: 1.1 mIU/L (ref 0.40–4.50)

## 2016-12-14 LAB — VITAMIN D 25 HYDROXY (VIT D DEFICIENCY, FRACTURES): VIT D 25 HYDROXY: 33 ng/mL (ref 30–100)

## 2016-12-14 LAB — IRON: IRON: 46 ug/dL (ref 45–160)

## 2016-12-20 ENCOUNTER — Ambulatory Visit (INDEPENDENT_AMBULATORY_CARE_PROVIDER_SITE_OTHER): Payer: Medicare Other | Admitting: Family Medicine

## 2016-12-20 ENCOUNTER — Encounter: Payer: Self-pay | Admitting: Family Medicine

## 2016-12-20 VITALS — BP 120/78 | HR 86 | Temp 97.9°F | Resp 16 | Ht 67.0 in | Wt 233.5 lb

## 2016-12-20 DIAGNOSIS — Z1211 Encounter for screening for malignant neoplasm of colon: Secondary | ICD-10-CM

## 2016-12-20 DIAGNOSIS — I10 Essential (primary) hypertension: Secondary | ICD-10-CM

## 2016-12-20 DIAGNOSIS — N39498 Other specified urinary incontinence: Secondary | ICD-10-CM | POA: Diagnosis not present

## 2016-12-20 DIAGNOSIS — K219 Gastro-esophageal reflux disease without esophagitis: Secondary | ICD-10-CM

## 2016-12-20 DIAGNOSIS — Z1231 Encounter for screening mammogram for malignant neoplasm of breast: Secondary | ICD-10-CM | POA: Diagnosis not present

## 2016-12-20 DIAGNOSIS — Z23 Encounter for immunization: Secondary | ICD-10-CM

## 2016-12-20 DIAGNOSIS — F418 Other specified anxiety disorders: Secondary | ICD-10-CM | POA: Diagnosis not present

## 2016-12-20 LAB — HEMOCCULT GUIAC POC 1CARD (OFFICE): Fecal Occult Blood, POC: NEGATIVE

## 2016-12-20 MED ORDER — PANTOPRAZOLE SODIUM 20 MG PO TBEC: 20.0000 mg | DELAYED_RELEASE_TABLET | Freq: Every day | ORAL | 1 refills | Status: DC

## 2016-12-20 NOTE — Assessment & Plan Note (Signed)
Controlled, no change in medication DASH diet and commitment to daily physical activity for a minimum of 30 minutes discussed and encouraged, as a part of hypertension management. The importance of attaining a healthy weight is also discussed.  BP/Weight 12/20/2016 09/20/2016 07/29/2016 07/25/2016 07/04/2016 07/02/2016 9/51/8841  Systolic BP 660 630 160 - 109 323 557  Diastolic BP 78 80 56 - 80 91 68  Wt. (Lbs) 233.5 224 - 229.1 228 227 231.04  BMI 36.57 35.08 - 35.88 36.8 34.52 37.29  Some encounter information is confidential and restricted. Go to Review Flowsheets activity to see all data.

## 2016-12-20 NOTE — Assessment & Plan Note (Signed)
Controlled, no change in medication  

## 2016-12-20 NOTE — Assessment & Plan Note (Signed)
Controlled ands managed by psychiatry

## 2016-12-20 NOTE — Progress Notes (Signed)
Jenna Woods     MRN: [id number]      DOB: June 11, 1960   HPI Jenna Woods is here for follow up and re-evaluation of chronic medical conditions, medication management and review of any available recent lab and radiology data.  Preventive health is updated, specifically  Cancer screening and Immunization.   Questions or concerns regarding consultations or procedures which the PT has had in the interim are  Addressed.Followed regu;larly by psychiatry and is doing well with this The PT denies any adverse reactions to current medications since the last visit.  No regular exercise but will start again, is caregul to avoid simple sugars There are no new concerns.  There are no specific complaints   ROS Denies recent fever or chills. Denies sinus pressure, nasal congestion, ear pain or sore throat. Denies chest congestion, productive cough or wheezing. Denies chest pains, palpitations and leg swelling Denies abdominal pain, nausea, vomiting,diarrhea or constipation.   Denies dysuria, frequency, hesitancy or incontinence. Denies joint pain, swelling and limitation in mobility. Denies headaches, seizures, numbness, or tingling. Denies depression, anxiety or insomnia. Denies skin break down or rash.   PE  BP 120/78 (BP Location: Left Arm, Patient Position: Sitting, Cuff Size: Normal)   Pulse 86   Temp 97.9 F (36.6 C) (Other (Comment))   Resp 16   Ht 5\' 7"  (1.702 m)   Wt 233 lb 8 oz (105.9 kg)   SpO2 99%   BMI 36.57 kg/m   Patient alert and oriented and in no cardiopulmonary distress.  HEENT: No facial asymmetry, EOMI,   oropharynx pink and moist.  Neck supple no JVD, no mass.  Chest: Clear to auscultation bilaterally.  CVS: S1, S2 no murmurs, no S3.Regular rate.  ABD: Soft non tender.  Rectal : no mass, heme negative stool  Ext: No edema  MS: Adequate ROM spine, shoulders, hips and knees.  Skin: Intact, no ulcerations or rash noted.  Psych: Good eye contact,  normal affect. Memory intact not anxious or depressed appearing.  CNS: CN 2-12 intact, power,  normal throughout.no focal deficits noted.   Assessment & Plan  Hypertension Controlled, no change in medication DASH diet and commitment to daily physical activity for a minimum of 30 minutes discussed and encouraged, as a part of hypertension management. The importance of attaining a healthy weight is also discussed.  BP/Weight 12/20/2016 09/20/2016 07/29/2016 07/25/2016 07/04/2016 07/02/2016 0/63/0160  Systolic BP 109 323 557 - 322 025 427  Diastolic BP 78 80 56 - 80 91 68  Wt. (Lbs) 233.5 224 - 229.1 228 227 231.04  BMI 36.57 35.08 - 35.88 36.8 34.52 37.29  Some encounter information is confidential and restricted. Go to Review Flowsheets activity to see all data.       GERD (gastroesophageal reflux disease) Reduce dos of Protonix, and stop caffeine intake and lose weight  Urinary incontinence Controlled, no change in medication   Morbid obesity ([place]) Deteriorated. Patient re-educated about  the importance of commitment to a  minimum of 150 minutes of exercise per week.  The importance of healthy food choices with portion control discussed. Encouraged to start a food diary, count calories and to consider  joining a support group. Sample diet sheets offered. Goals set by the patient for the next several months.   Weight /BMI 12/20/2016 09/20/2016 07/25/2016  WEIGHT 233 lb 8 oz 224 lb 229 lb 1.6 oz  HEIGHT 5\' 7"  5\' 7"  5\' 7"   BMI 36.57 kg/m2 35.08 kg/m2 35.88 kg/m2  Some encounter information is confidential and restricted. Go to Review Flowsheets activity to see all data.      Depression with anxiety Controlled ands managed by psychiatry  Diverticulosis of colon with hemorrhage No overt bleeding however stool is heme positive on exam

## 2016-12-20 NOTE — Assessment & Plan Note (Signed)
Reduce dos of Protonix, and stop caffeine intake and lose weight

## 2016-12-20 NOTE — Patient Instructions (Addendum)
F/u in 5 months, call if you need me sooner  Flu vaccine today  Reduced dose of protonix is 20 mg daily  Please schedule mammogram at checkout due in December  It is important that you exercise regularly at least 30 minutes 5 times a week. If you develop chest pain, have severe difficulty breathing, or feel very tired, stop exercising immediately and seek medical attention    Please work on good  health habits so that your health will improve. 1. Commitment to daily physical activity for 30 to 60  minutes, if you are able to do this.  2. Commitment to wise food choices. Aim for half of your  food intake to be vegetable and fruit, one quarter starchy foods, and one quarter protein. Try to eat on a regular schedule  3 meals per day, snacking between meals should be limited to vegetables or fruits or small portions of nuts. 64 ounces of water per day is generally recommended, unless you have specific health conditions, like heart failure or kidney failure where you will need to limit fluid intake.  3. Commitment to sufficient and a  good quality of physical and mental rest daily, generally between 6 to 8 hours per day.  WITH PERSISTANCE AND PERSEVERANCE, THE IMPOSSIBLE , BECOMES THE NORM!   Weight loss goal of 10 pounds

## 2016-12-20 NOTE — Assessment & Plan Note (Signed)
No overt bleeding however stool is heme positive on exam

## 2016-12-20 NOTE — Assessment & Plan Note (Signed)
Deteriorated. Patient re-educated about  the importance of commitment to a  minimum of 150 minutes of exercise per week.  The importance of healthy food choices with portion control discussed. Encouraged to start a food diary, count calories and to consider  joining a support group. Sample diet sheets offered. Goals set by the patient for the next several months.   Weight /BMI 12/20/2016 09/20/2016 07/25/2016  WEIGHT 233 lb 8 oz 224 lb 229 lb 1.6 oz  HEIGHT 5\' 7"  5\' 7"  5\' 7"   BMI 36.57 kg/m2 35.08 kg/m2 35.88 kg/m2  Some encounter information is confidential and restricted. Go to Review Flowsheets activity to see all data.

## 2016-12-21 NOTE — Addendum Note (Signed)
Addended by: Merceda Elks on: 12/21/2016 07:49 AM   Modules accepted: Orders

## 2017-01-20 ENCOUNTER — Other Ambulatory Visit: Payer: Self-pay | Admitting: Family Medicine

## 2017-01-20 DIAGNOSIS — Z1231 Encounter for screening mammogram for malignant neoplasm of breast: Secondary | ICD-10-CM

## 2017-01-31 ENCOUNTER — Other Ambulatory Visit: Payer: Self-pay | Admitting: Family Medicine

## 2017-02-07 ENCOUNTER — Encounter: Payer: Self-pay | Admitting: Family Medicine

## 2017-02-07 ENCOUNTER — Ambulatory Visit (INDEPENDENT_AMBULATORY_CARE_PROVIDER_SITE_OTHER): Payer: Medicare Other | Admitting: Psychiatry

## 2017-02-07 ENCOUNTER — Encounter (HOSPITAL_COMMUNITY): Payer: Self-pay | Admitting: Psychiatry

## 2017-02-07 VITALS — BP 124/84 | HR 84 | Ht 67.0 in | Wt 232.0 lb

## 2017-02-07 DIAGNOSIS — Z813 Family history of other psychoactive substance abuse and dependence: Secondary | ICD-10-CM | POA: Diagnosis not present

## 2017-02-07 DIAGNOSIS — Z736 Limitation of activities due to disability: Secondary | ICD-10-CM | POA: Diagnosis not present

## 2017-02-07 DIAGNOSIS — F331 Major depressive disorder, recurrent, moderate: Secondary | ICD-10-CM

## 2017-02-07 DIAGNOSIS — Z811 Family history of alcohol abuse and dependence: Secondary | ICD-10-CM

## 2017-02-07 DIAGNOSIS — Z810 Family history of intellectual disabilities: Secondary | ICD-10-CM | POA: Diagnosis not present

## 2017-02-07 DIAGNOSIS — M329 Systemic lupus erythematosus, unspecified: Secondary | ICD-10-CM | POA: Diagnosis not present

## 2017-02-07 DIAGNOSIS — F419 Anxiety disorder, unspecified: Secondary | ICD-10-CM

## 2017-02-07 MED ORDER — ALPRAZOLAM 1 MG PO TABS
1.0000 mg | ORAL_TABLET | Freq: Three times a day (TID) | ORAL | 3 refills | Status: DC
Start: 2017-02-07 — End: 2017-06-05

## 2017-02-07 MED ORDER — TRAZODONE HCL 100 MG PO TABS: 100.0000 mg | ORAL_TABLET | Freq: Every evening | ORAL | 3 refills | Status: DC | PRN
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
## 2017-02-08 ENCOUNTER — Encounter (HOSPITAL_COMMUNITY): Payer: Self-pay

## 2017-02-08 ENCOUNTER — Ambulatory Visit (HOSPITAL_COMMUNITY)
Admission: RE | Admit: 2017-02-08 | Discharge: 2017-02-08 | Disposition: A | Discharge status: Home / Self Care | Payer: Medicare Other | Source: Ambulatory Visit | Attending: Family Medicine | Admitting: Family Medicine

## 2017-02-08 DIAGNOSIS — Z1231 Encounter for screening mammogram for malignant neoplasm of breast: Secondary | ICD-10-CM | POA: Diagnosis not present

## 2017-03-30 ENCOUNTER — Encounter (HOSPITAL_COMMUNITY): Payer: Self-pay | Admitting: Emergency Medicine

## 2017-03-30 ENCOUNTER — Other Ambulatory Visit: Payer: Self-pay

## 2017-03-30 ENCOUNTER — Emergency Department (HOSPITAL_COMMUNITY)
Admission: EM | Admit: 2017-03-30 | Discharge: 2017-03-31 | Disposition: A | Discharge status: Home / Self Care | Payer: Medicare Other | Attending: Emergency Medicine | Admitting: Emergency Medicine

## 2017-03-30 DIAGNOSIS — I10 Essential (primary) hypertension: Secondary | ICD-10-CM | POA: Insufficient documentation

## 2017-03-30 DIAGNOSIS — Z79899 Other long term (current) drug therapy: Secondary | ICD-10-CM | POA: Diagnosis not present

## 2017-03-30 DIAGNOSIS — H10021 Other mucopurulent conjunctivitis, right eye: Secondary | ICD-10-CM | POA: Diagnosis not present

## 2017-03-30 DIAGNOSIS — Z87891 Personal history of nicotine dependence: Secondary | ICD-10-CM | POA: Diagnosis not present

## 2017-03-30 DIAGNOSIS — H1031 Unspecified acute conjunctivitis, right eye: Secondary | ICD-10-CM | POA: Diagnosis not present

## 2017-03-30 DIAGNOSIS — H579 Unspecified disorder of eye and adnexa: Secondary | ICD-10-CM | POA: Diagnosis present

## 2017-03-30 LAB — RAPID STREP SCREEN (MED CTR MEBANE ONLY): Streptococcus, Group A Screen (Direct): NEGATIVE

## 2017-03-30 MED ORDER — ACETAMINOPHEN 500 MG PO TABS
1000.0000 mg | ORAL_TABLET | Freq: Once | ORAL | Status: AC
  Administered 2017-03-30: 1000 mg via ORAL
  Filled 2017-03-30: qty 2

## 2017-03-30 MED ORDER — TOBRAMYCIN 0.3 % OP SOLN
1.0000 [drp] | Freq: Once | OPHTHALMIC | Status: AC
  Administered 2017-03-30: 1 [drp] via OPHTHALMIC
  Filled 2017-03-30: qty 5

## 2017-03-30 NOTE — ED Triage Notes (Signed)
Pt c/o right eye drainage and voice hoarseness.

## 2017-03-31 MED ORDER — MAGIC MOUTHWASH W/LIDOCAINE: 10.0000 mL | Freq: Four times a day (QID) | ORAL | 0 refills | Status: DC | PRN

## 2017-03-31 NOTE — ED Provider Notes (Signed)
[facility] EMERGENCY DEPARTMENT Provider Note   CSN: [id number] Arrival date & time: 03/30/17  1808     History   Chief Complaint Chief Complaint  Patient presents with  . Eye Drainage    HPI Jenna Woods is a 57 y.o. female with a history as outlined below presenting for evaluation of suspected pinkeye.  She babysat her granddaughter this past week who had active pinkeye and Ms. Stech has developed redness and copious tearing but also thick discharge from the right eye which started today.  She also reports throat pain and hoarseness of voice starting today also.  She denies fevers or chills, but does endorse generalized body aches since her symptoms began.  She has had no cough, shortness of breath, chest pain, nausea vomiting or abdominal pain.  She also denies nasal congestion, rhinorrhea or ear pain.  She has had no medications prior to arrival.  The history is provided by the patient.    Past Medical History:  Diagnosis Date  . Allergic reaction   . Allergy    perrenial   . Anxiety   . Arthritis   . Depression    h/o suicidal ideation in 2011  . Depression with anxiety   . Diverticulosis of colon 12/19/2015  . Diverticulosis of colon with hemorrhage 12/19/2015  . GERD (gastroesophageal reflux disease)   . Hypertension 1996  . Obesity   . Peripheral vascular disease ([place])   . Prediabetes 2013  . Scleroderma ([place]) 1998  . Systemic lupus erythematosus ([place]) 1998   Treated at [facility]  . Tobacco abuse, in remission    10-pack-years discontinued in 2007  . Urinary frequency     Patient Active Problem List   Diagnosis Date Noted  . Lower GI bleed 07/25/2016  . Diverticulosis of colon with hemorrhage 12/19/2015  . Insomnia due to mental disorder 09/15/2014  . Major depression 08/29/2014  . Osteoarthritis of left knee 08/05/2013  . Depression with anxiety 04/02/2013  . IGT (impaired glucose tolerance) 06/05/2012  . Vitamin D deficiency 09/23/2011  .  Urinary incontinence 10/26/2010  . GERD (gastroesophageal reflux disease)   . Scleroderma ([place])   . Systemic lupus erythematosus ([place])   . Morbid obesity ([place]) 04/02/2009  . Hypertension 04/02/2009    Past Surgical History:  Procedure Laterality Date  . ABDOMINAL HYSTERECTOMY  1990   Neoplasm  . CESAREAN SECTION     x2  . COLONOSCOPY  2005  . COLONOSCOPY WITH PROPOFOL N/A 08/28/2015   Dr. Laural Golden: scattered medium-mouth diverticula entire colon, external hemorrhoids  . COLONOSCOPY WITH PROPOFOL N/A 07/27/2016   Procedure: COLONOSCOPY WITH PROPOFOL;  Surgeon: Rogene Houston, MD;  [facility];  Service: Endoscopy;  Laterality: N/A;  . FINGER AMPUTATION     Third finger, bilateral, distal  . NASAL SINUS SURGERY  09/06/2011   Procedure: ENDOSCOPIC SINUS SURGERY;  Surgeon: Ascencion Dike, MD;  Location: [facility];  Service: ENT;  Laterality: N/A;  Nasal cerebrospinal fluid leak repair with Left temporalis fascia graft and Left Ear cartilage graft  . VASCULAR SURGERY     Hands, bilaterally, [facility]; right hand partial amputation of middle finger.    OB History    Gravida Para Term Preterm AB Living   4 2     2 2    SAB TAB Ectopic Multiple Live Births   2               Home Medications    Prior  to Admission medications   Medication Sig Start Date End Date Taking? Authorizing Provider  ALPRAZolam Duanne Moron) 1 MG tablet Take 1 tablet (1 mg total) by mouth 3 (three) times daily. 02/07/17   Cloria Spring, MD  amLODipine (NORVASC) 10 MG tablet TAKE ONE (1) TABLET BY MOUTH EVERY DAY 02/02/17   Fayrene Helper, MD  Calcium Carbonate-Vit D-Min (CALCIUM 1200) 1200-1000 MG-UNIT CHEW Chew 1 tablet by mouth daily.    [provider]  Cholecalciferol (VITAMIN D) 2000 units CAPS Omne daily 09/20/16   Fayrene Helper, MD  ferrous sulfate (CVS IRON) 325 (65 FE) MG tablet Take 1 tablet (325 mg total) by mouth 2 (two) times daily with a meal. 09/20/16    Fayrene Helper, MD  magic mouthwash w/lidocaine SOLN Take 10 mLs by mouth 4 (four) times daily as needed (throat pain). Note to pharmacy - equal parts diphendydramine, aluminum hydroxide and lidocaine HCL 03/31/17   Raequan Vanschaick, Almyra Free, PA-C  montelukast (SINGULAIR) 10 MG tablet TAKE ONE TABLET BY MOUTH EVERY NIGHT AT BEDTIME 08/10/16   Fayrene Helper, MD  pantoprazole (PROTONIX) 20 MG tablet Take 1 tablet (20 mg total) by mouth daily. 12/20/16   Fayrene Helper, MD  TOVIAZ 8 MG TB24 tablet TAKE ONE (1) TABLET EACH DAY 07/19/16   Fayrene Helper, MD  traZODone (DESYREL) 100 MG tablet Take 1 tablet (100 mg total) by mouth at bedtime as needed for sleep. 02/07/17   Cloria Spring, MD    Family History Family History  Problem Relation Age of Onset  . Arthritis Mother        Rheumatoid  . Dementia Mother   . Hypertension Mother   . Cirrhosis Father   . Alcohol abuse Father   . Arthritis Maternal Grandmother   . Drug abuse Sister   . Graves' disease Son   . Colon cancer Neg Hx     Social History Social History   Tobacco Use  . Smoking status: Former Smoker    Packs/day: 0.25    Years: 25.00    Pack years: 6.25    Types: Cigarettes    Last attempt to quit: 04/07/2001    Years since quitting: 15.9  . Smokeless tobacco: Never Used  Substance Use Topics  . Alcohol use: No    Alcohol/week: 0.0 oz    Comment: quit in 2004  . Drug use: No    Comment: use to use pot      Allergies   Bee venom; Sesame oil; Aspirin; Molds & smuts; and Codeine   Review of Systems Review of Systems  Constitutional: Negative for chills and fever.  HENT: Positive for sore throat. Negative for congestion, ear pain, rhinorrhea, sinus pressure, trouble swallowing and voice change.   Eyes: Positive for discharge and redness. Negative for pain and visual disturbance.  Respiratory: Negative for cough, shortness of breath, wheezing and stridor.   Cardiovascular: Negative for chest pain.    Gastrointestinal: Negative for abdominal pain.  Genitourinary: Negative.   Musculoskeletal: Positive for myalgias.     Physical Exam Updated Vital Signs BP 126/90 (BP Location: Right Arm)   Pulse (!) 101   Temp 98.6 F (37 C) (Oral)   Resp 17   Ht 5\' 8"  (1.727 m)   Wt 104.7 kg (230 lb 14.4 oz)   SpO2 96%   BMI 35.11 kg/m   Physical Exam  Constitutional: She is oriented to person, place, and time. She appears well-developed and well-nourished.  HENT:  Head: Normocephalic and atraumatic.  Right Ear: Tympanic membrane and ear canal normal.  Left Ear: Tympanic membrane and ear canal normal.  Nose: No mucosal edema or rhinorrhea.  Mouth/Throat: Uvula is midline and mucous membranes are normal. Posterior oropharyngeal erythema present. No oropharyngeal exudate, posterior oropharyngeal edema or tonsillar abscesses. Tonsils are 2+ on the right. Tonsils are 2+ on the left.  Eyes: EOM are normal. Pupils are equal, round, and reactive to light. Right eye exhibits discharge. Right conjunctiva is injected.  Cardiovascular: Normal rate and normal heart sounds.  Pulmonary/Chest: Effort normal. No respiratory distress. She has no wheezes. She has no rales.  Abdominal: Soft. There is no tenderness.  Musculoskeletal: Normal range of motion.  Neurological: She is alert and oriented to person, place, and time.  Skin: Skin is warm and dry. No rash noted.  Psychiatric: She has a normal mood and affect.     ED Treatments / Results  Labs (all labs ordered are listed, but only abnormal results are displayed) Labs Reviewed  RAPID STREP SCREEN (NOT AT [facility])  CULTURE, GROUP A STREP [facility])    EKG  EKG Interpretation None       Radiology No results found.  Procedures Procedures (including critical care time)  Medications Ordered in ED Medications  tobramycin (TOBREX) 0.3 % ophthalmic solution 1 drop (1 drop Both Eyes Given 03/30/17 2247)  acetaminophen (TYLENOL) tablet 1,000 mg  (1,000 mg Oral Given 03/30/17 2246)     Initial Impression / Assessment and Plan / ED Course  I have reviewed the triage vital signs and the nursing notes.  Pertinent labs & imaging results that were available during my care of the patient were reviewed by me and considered in my medical decision making (see chart for details).     Rapid strep was negative, culture pending.  Patient was treated with Tobrex eyedrops and these were sent home with her for continued home use, 4 times daily for the next 7 days applied to both eyes.  Advised Tylenol or Motrin for throat pain relief and body aches.  Magic mouthwash also prescribed if needed for persistent sore throat.  Advised rest, increase fluid intake, follow-up with her PCP if symptoms persist or worsen.  Final Clinical Impressions(s) / ED Diagnoses   Final diagnoses:  Pink eye disease of right eye    ED Discharge Orders        Ordered    magic mouthwash w/lidocaine SOLN  4 times daily PRN     03/31/17 0027       Evalee Jefferson, PA-C 03/31/17 0156    Long, Wonda [facility], MD 03/31/17 [phone]

## 2017-03-31 NOTE — Discharge Instructions (Signed)
Apply 1 drop of the antibiotic in both eyes 4 times daily (every 4 hours while awake) for the next 7 days.  Continue taking Tylenol or ibuprofen for body aches or fever.  Your strep test is negative.  These medications can also help with your throat pain.  You have also been prescribed a liquid medicine you can gargle with for throat pain relief if needed.  Rest to make sure you are drinking plenty fluids.  See your doctor for recheck if your symptoms are not improving.  Make sure you wash her hands frequently and avoid touching and rubbing your eyes.

## 2017-04-02 LAB — CULTURE, GROUP A STREP (THRC)

## 2017-04-12 ENCOUNTER — Other Ambulatory Visit: Payer: Self-pay | Admitting: Family Medicine

## 2017-04-17 ENCOUNTER — Ambulatory Visit (INDEPENDENT_AMBULATORY_CARE_PROVIDER_SITE_OTHER): Payer: Medicare Other

## 2017-04-17 ENCOUNTER — Other Ambulatory Visit: Payer: Self-pay | Admitting: Family Medicine

## 2017-04-17 ENCOUNTER — Telehealth: Payer: Self-pay | Admitting: Family Medicine

## 2017-04-17 VITALS — BP 120/74 | HR 84 | Temp 97.9°F | Resp 16 | Wt 235.0 lb

## 2017-04-17 DIAGNOSIS — Z0000 Encounter for general adult medical examination without abnormal findings: Secondary | ICD-10-CM | POA: Diagnosis not present

## 2017-04-17 MED ORDER — PANTOPRAZOLE SODIUM 20 MG PO TBEC: 20.0000 mg | DELAYED_RELEASE_TABLET | Freq: Every day | ORAL | 1 refills | Status: DC

## 2017-04-17 MED ORDER — BENZONATATE 100 MG PO CAPS: 100.0000 mg | ORAL_CAPSULE | Freq: Two times a day (BID) | ORAL | 0 refills | Status: DC | PRN

## 2017-04-17 MED ORDER — PANTOPRAZOLE SODIUM 20 MG PO TBEC: 20.0000 mg | DELAYED_RELEASE_TABLET | Freq: Every day | ORAL | 5 refills | Status: DC

## 2017-04-17 MED ORDER — PROMETHAZINE-DM 6.25-15 MG/5ML PO SYRP: ORAL_SOLUTION | ORAL | 0 refills | Status: DC

## 2017-04-17 NOTE — Progress Notes (Signed)
Subjective:   Jenna Woods is a 57 y.o. female who presents for Medicare Annual (Subsequent) preventive examination.  Review of Systems:         Objective:     Vitals: There were no vitals taken for this visit.  There is no height or weight on file to calculate BMI.  Advanced Directives 03/30/2017 07/27/2016 07/25/2016 07/25/2016 07/02/2016 04/18/2016 12/19/2015  Does Patient Have a Medical Advance Directive? No Yes Yes Yes Yes Yes Yes  Type of Advance Directive - - Living will Living will Living will [place];Living will [place];Living will  Does patient want to make changes to medical advance directive? - - No - Patient declined - No - Patient declined No - Patient declined No - Patient declined  Copy of [place] in Chart? - - - - - No - copy requested No - copy requested  Would patient like information on creating a medical advance directive? No - Patient declined - - - No - Patient declined - -  Pre-existing out of facility DNR order (yellow form or pink MOST form) - - - - - - -    Tobacco Social History   Tobacco Use  Smoking Status Former Smoker  . Packs/day: 0.25  . Years: 25.00  . Pack years: 6.25  . Types: Cigarettes  . Last attempt to quit: 04/07/2001  . Years since quitting: 16.0  Smokeless Tobacco Never Used     Counseling given: Not Answered   Clinical Intake:                       Past Medical History:  Diagnosis Date  . Allergic reaction   . Allergy    perrenial   . Anxiety   . Arthritis   . Depression    h/o suicidal ideation in 2011  . Depression with anxiety   . Diverticulosis of colon 12/19/2015  . Diverticulosis of colon with hemorrhage 12/19/2015  . GERD (gastroesophageal reflux disease)   . Hypertension 1996  . Obesity   . Peripheral vascular disease ([place])   . Prediabetes 2013  . Scleroderma ([place]) 1998  . Systemic lupus erythematosus ([place]) 1998   Treated at  [facility]  . Tobacco abuse, in remission    10-pack-years discontinued in 2007  . Urinary frequency    Past Surgical History:  Procedure Laterality Date  . ABDOMINAL HYSTERECTOMY  1990   Neoplasm  . CESAREAN SECTION     x2  . COLONOSCOPY  2005  . COLONOSCOPY WITH PROPOFOL N/A 08/28/2015   Dr. Laural Golden: scattered medium-mouth diverticula entire colon, external hemorrhoids  . COLONOSCOPY WITH PROPOFOL N/A 07/27/2016   Procedure: COLONOSCOPY WITH PROPOFOL;  Surgeon: Rogene Houston, MD;  Location[facility];  Service: Endoscopy;  Laterality: N/A;  . FINGER AMPUTATION     Third finger, bilateral, distal  . NASAL SINUS SURGERY  09/06/2011   Procedure: ENDOSCOPIC SINUS SURGERY;  Surgeon: Ascencion Dike, MD;  Location: [place] [place];  Service: ENT;  Laterality: N/A;  Nasal cerebrospinal fluid leak repair with Left temporalis fascia graft and Left Ear cartilage graft  . VASCULAR SURGERY     Hands, bilaterally, [facility]; right hand partial amputation of middle finger.   Family History  Problem Relation Age of Onset  . Arthritis Mother        Rheumatoid  . Dementia Mother   . Hypertension Mother   . Cirrhosis  Father   . Alcohol abuse Father   . Arthritis Maternal Grandmother   . Drug abuse Sister   . Graves' disease Son   . Colon cancer Neg Hx    Social History   Socioeconomic History  . Marital status: Single    Spouse name: Not on file  . Number of children: Not on file  . Years of education: Not on file  . Highest education level: Not on file  Social Needs  . Financial resource strain: Not on file  . Food insecurity - worry: Not on file  . Food insecurity - inability: Not on file  . Transportation needs - medical: Not on file  . Transportation needs - non-medical: Not on file  Occupational History  . Occupation: Art therapist: [employer]    Comment: Disability awarded in [place] Use  . Smoking status: Former Smoker     Packs/day: 0.25    Years: 25.00    Pack years: 6.25    Types: Cigarettes    Last attempt to quit: 04/07/2001    Years since quitting: 16.0  . Smokeless tobacco: Never Used  Substance and Sexual Activity  . Alcohol use: No    Alcohol/week: 0.0 oz    Comment: quit in 2004  . Drug use: No    Comment: use to use pot   . Sexual activity: Yes    Birth control/protection: Surgical  Other Topics Concern  . Not on file  Social History Narrative  . Not on file    Outpatient Encounter Medications as of 04/17/2017  Medication Sig  . ALPRAZolam (XANAX) 1 MG tablet Take 1 tablet (1 mg total) by mouth 3 (three) times daily.  Marland Kitchen amLODipine (NORVASC) 10 MG tablet TAKE ONE (1) TABLET BY MOUTH EVERY DAY  . Calcium Carbonate-Vit D-Min (CALCIUM 1200) 1200-1000 MG-UNIT CHEW Chew 1 tablet by mouth daily.  . Cholecalciferol (VITAMIN D) 2000 units CAPS Omne daily  . ferrous sulfate ([facility] IRON) 325 (65 FE) MG tablet Take 1 tablet (325 mg total) by mouth 2 (two) times daily with a meal.  . magic mouthwash w/lidocaine SOLN Take 10 mLs by mouth 4 (four) times daily as needed (throat pain). Note to pharmacy - equal parts diphendydramine, aluminum hydroxide and lidocaine HCL  . montelukast (SINGULAIR) 10 MG tablet TAKE ONE TABLET BY MOUTH EVERY NIGHT AT BEDTIME  . pantoprazole (PROTONIX) 20 MG tablet Take 1 tablet (20 mg total) by mouth daily.  . TOVIAZ 8 MG TB24 tablet TAKE ONE (1) TABLET EACH DAY  . traZODone (DESYREL) 100 MG tablet Take 1 tablet (100 mg total) by mouth at bedtime as needed for sleep.   No facility-administered encounter medications on file as of 04/17/2017.     Activities of Daily Living In your present state of health, do you have any difficulty performing the following activities: 07/25/2016 04/18/2016  Hearing? N N  Vision? N N  Difficulty concentrating or making decisions? N N  Walking or climbing stairs? N N  Dressing or bathing? N N  Doing errands, shopping? N N  Preparing Food and  eating ? - N  Using the Toilet? - N  In the past six months, have you accidently leaked urine? - N  Do you have problems with loss of bowel control? - N  Managing your Medications? - N  Managing your Finances? - N  Housekeeping or managing your Housekeeping? - N  Some recent data might be hidden  Patient Care Team: Fayrene Helper, MD as PCP - General Harrington Challenger Su Ley, MD as Consulting Physician [facility])    Assessment:   This is a routine wellness examination for Cami.  Exercise Activities and Dietary recommendations    Goals    . Quit smoking / using tobacco       Fall Risk Fall Risk  04/18/2016 03/27/2015 10/05/2012  Falls in the past year? No No No   Is the patient's home free of loose throw rugs in walkways, pet beds, electrical cords, etc?   no      Grab bars in the bathroom? no      Handrails on the stairs?   no      Adequate lighting?   yes   Depression Screen PHQ 2/9 Scores 04/18/2016 03/27/2015 06/04/2014 03/05/2014  PHQ - 2 Score 1 6 3 3   PHQ- 9 Score - 22 11 17      Cognitive Function     6CIT Screen 04/18/2016  What Year? 0 points  What month? 0 points  What time? 0 points  Count back from 20 0 points  Months in reverse 0 points  Repeat phrase 0 points  Total Score 0    Immunization History  Administered Date(s) Administered  . Influenza Split 01/10/2012  . Influenza Whole 12/04/2009, 12/29/2010  . Influenza,inj,Quad PF,6+ Mos 11/29/2012, 10/28/2013, 11/18/2014, 12/15/2015, 12/20/2016  . PPD Test 11/09/2011  . Tdap 12/29/2010    Qualifies for Shingles Vaccine? Yes. She has been advised of this. She has been advised to discuss this with Dr. Moshe Cipro as well as call her insurance for coverage information.  Screening Tests Health Maintenance  Topic Date Due  . PAP SMEAR  04/26/2018 (Originally 02/07/2015)  . MAMMOGRAM  02/09/2019  . TETANUS/TDAP  12/28/2020  . COLONOSCOPY  07/28/2026  . INFLUENZA VACCINE  Completed  . Hepatitis C  Screening  Completed  . HIV Screening  Completed    Cancer Screenings: Lung: Low Dose CT Chest recommended if Age 60-80 years, 30 pack-year currently smoking OR have quit w/in 15years. Patient does not qualify. Breast:  Up to date on Mammogram? Yes   Up to date of Bone Density/Dexa? No Colorectal: She has recently had a diagnostic colonoscopy due to hematochezia. She will discuss repeat colonoscopy needs with PCP/GI       Plan:      I have personally reviewed and noted the following in the patient's chart:   . Medical and social history . Use of alcohol, tobacco or illicit drugs  . Current medications and supplements . Functional ability and status . Nutritional status . Physical activity . Advanced directives . List of other physicians . Hospitalizations, surgeries, and ER visits in previous 12 months . Vitals . Screenings to include cognitive, depression, and falls . Referrals and appointments  In addition, I have reviewed and discussed with patient certain preventive protocols, quality metrics, and best practice recommendations. A written personalized care plan for preventive services as well as general preventive health recommendations were provided to patient.     Merceda Elks, LPN  1/61/0960

## 2017-04-17 NOTE — Telephone Encounter (Signed)
Patient was in today for AWV- was seen recently at ED for pink eye. States she mentioned cough to them, they did not give her anything. She is asking for something for cough. She is hoarse, chest pressure from cough. States she is waking up in the middle of the night. Clear mucus is what she is expectorating.

## 2017-04-17 NOTE — Patient Instructions (Signed)
Ms. Nofziger , Thank you for taking time to come for your Medicare Wellness Visit. I appreciate your ongoing commitment to your health goals. Please review the following plan we discussed and let me know if I can assist you in the future.   Screening recommendations/referrals: Colonoscopy: Discuss when to have your repeat colonoscopy with Dr. Moshe Cipro Mammogram: Due 02/2018 Bone Density: It does not look like you have completed this. Please discuss this with Dr. Moshe Cipro Recommended yearly ophthalmology/optometry visit for glaucoma screening and checkup Recommended yearly dental visit for hygiene and checkup  Vaccinations: Influenza vaccine: Due fall 2019 Pneumococcal vaccine: Due at age 32, or at Dr. Griffin Dakin discretion Tdap vaccine: Due 2022 Shingles vaccine: You are due for this. Please discuss the need for this with Dr. Moshe Cipro. Also, make sure to call your insurance for coverage information.    Advanced directives:  Discussed today in office.  Conditions/risks identified: None  Next appointment: 05/23/17  Preventive Care 40-64 Years, Female Preventive care refers to lifestyle choices and visits with your health care provider that can promote health and wellness. What does preventive care include?  A yearly physical exam. This is also called an annual well check.  Dental exams once or twice a year.  Routine eye exams. Ask your health care provider how often you should have your eyes checked.  Personal lifestyle choices, including:  Daily care of your teeth and gums.  Regular physical activity.  Eating a healthy diet.  Avoiding tobacco and drug use.  Limiting alcohol use.  Practicing safe sex.  Taking low-dose aspirin daily starting at age 21.  Taking vitamin and mineral supplements as recommended by your health care provider. What happens during an annual well check? The services and screenings done by your health care provider during your annual well check will  depend on your age, overall health, lifestyle risk factors, and family history of disease. Counseling  Your health care provider may ask you questions about your:  Alcohol use.  Tobacco use.  Drug use.  Emotional well-being.  Home and relationship well-being.  Sexual activity.  Eating habits.  Work and work Statistician.  Method of birth control.  Menstrual cycle.  Pregnancy history. Screening  You may have the following tests or measurements:  Height, weight, and BMI.  Blood pressure.  Lipid and cholesterol levels. These may be checked every 5 years, or more frequently if you are over 53 years old.  Skin check.  Lung cancer screening. You may have this screening every year starting at age 101 if you have a 30-pack-year history of smoking and currently smoke or have quit within the past 15 years.  Fecal occult blood test (FOBT) of the stool. You may have this test every year starting at age 48.  Flexible sigmoidoscopy or colonoscopy. You may have a sigmoidoscopy every 5 years or a colonoscopy every 10 years starting at age 75.  Hepatitis C blood test.  Hepatitis B blood test.  Sexually transmitted disease (STD) testing.  Diabetes screening. This is done by checking your blood sugar (glucose) after you have not eaten for a while (fasting). You may have this done every 1-3 years.  Mammogram. This may be done every 1-2 years. Talk to your health care provider about when you should start having regular mammograms. This may depend on whether you have a family history of breast cancer.  BRCA-related cancer screening. This may be done if you have a family history of breast, ovarian, tubal, or peritoneal cancers.  Pelvic  exam and Pap test. This may be done every 3 years starting at age 35. Starting at age 78, this may be done every 5 years if you have a Pap test in combination with an HPV test.  Bone density scan. This is done to screen for osteoporosis. You may have  this scan if you are at high risk for osteoporosis. Discuss your test results, treatment options, and if necessary, the need for more tests with your health care provider. Vaccines  Your health care provider may recommend certain vaccines, such as:  Influenza vaccine. This is recommended every year.  Tetanus, diphtheria, and acellular pertussis (Tdap, Td) vaccine. You may need a Td booster every 10 years.  Zoster vaccine. You may need this after age 77.  Pneumococcal 13-valent conjugate (PCV13) vaccine. You may need this if you have certain conditions and were not previously vaccinated.  Pneumococcal polysaccharide (PPSV23) vaccine. You may need one or two doses if you smoke cigarettes or if you have certain conditions. Talk to your health care provider about which screenings and vaccines you need and how often you need them. This information is not intended to replace advice given to you by your health care provider. Make sure you discuss any questions you have with your health care provider. Document Released: 03/20/2015 Document Revised: 11/11/2015 Document Reviewed: 12/23/2014 Elsevier Interactive Patient Education  2017 [place] Prevention in the Home Falls can cause injuries. They can happen to people of all ages. There are many things you can do to make your home safe and to help prevent falls. What can I do on the outside of my home?  Regularly fix the edges of walkways and driveways and fix any cracks.  Remove anything that might make you trip as you walk through a door, such as a raised step or threshold.  Trim any bushes or trees on the path to your home.  Use bright outdoor lighting.  Clear any walking paths of anything that might make someone trip, such as rocks or tools.  Regularly check to see if handrails are loose or broken. Make sure that both sides of any steps have handrails.  Any raised decks and porches should have guardrails on the  edges.  Have any leaves, snow, or ice cleared regularly.  Use sand or salt on walking paths during winter.  Clean up any spills in your garage right away. This includes oil or grease spills. What can I do in the bathroom?  Use night lights.  Install grab bars by the toilet and in the tub and shower. Do not use towel bars as grab bars.  Use non-skid mats or decals in the tub or shower.  If you need to sit down in the shower, use a plastic, non-slip stool.  Keep the floor dry. Clean up any water that spills on the floor as soon as it happens.  Remove soap buildup in the tub or shower regularly.  Attach bath mats securely with double-sided non-slip rug tape.  Do not have throw rugs and other things on the floor that can make you trip. What can I do in the bedroom?  Use night lights.  Make sure that you have a light by your bed that is easy to reach.  Do not use any sheets or blankets that are too big for your bed. They should not hang down onto the floor.  Have a firm chair that has side arms. You can use this for support  while you get dressed.  Do not have throw rugs and other things on the floor that can make you trip. What can I do in the kitchen?  Clean up any spills right away.  Avoid walking on wet floors.  Keep items that you use a lot in easy-to-reach places.  If you need to reach something above you, use a strong step stool that has a grab bar.  Keep electrical cords out of the way.  Do not use floor polish or wax that makes floors slippery. If you must use wax, use non-skid floor wax.  Do not have throw rugs and other things on the floor that can make you trip. What can I do with my stairs?  Do not leave any items on the stairs.  Make sure that there are handrails on both sides of the stairs and use them. Fix handrails that are broken or loose. Make sure that handrails are as long as the stairways.  Check any carpeting to make sure that it is firmly  attached to the stairs. Fix any carpet that is loose or worn.  Avoid having throw rugs at the top or bottom of the stairs. If you do have throw rugs, attach them to the floor with carpet tape.  Make sure that you have a light switch at the top of the stairs and the bottom of the stairs. If you do not have them, ask someone to add them for you. What else can I do to help prevent falls?  Wear shoes that:  Do not have high heels.  Have rubber bottoms.  Are comfortable and fit you well.  Are closed at the toe. Do not wear sandals.  If you use a stepladder:  Make sure that it is fully opened. Do not climb a closed stepladder.  Make sure that both sides of the stepladder are locked into place.  Ask someone to hold it for you, if possible.  Clearly mark and make sure that you can see:  Any grab bars or handrails.  First and last steps.  Where the edge of each step is.  Use tools that help you move around (mobility aids) if they are needed. These include:  Canes.  Walkers.  Scooters.  Crutches.  Turn on the lights when you go into a dark area. Replace any light bulbs as soon as they burn out.  Set up your furniture so you have a clear path. Avoid moving your furniture around.  If any of your floors are uneven, fix them.  If there are any pets around you, be aware of where they are.  Review your medicines with your doctor. Some medicines can make you feel dizzy. This can increase your chance of falling. Ask your doctor what other things that you can do to help prevent falls. This information is not intended to replace advice given to you by your health care provider. Make sure you discuss any questions you have with your health care provider. Document Released: 12/18/2008 Document Revised: 07/30/2015 Document Reviewed: 03/28/2014 Elsevier Interactive Patient Education  2017 [place] for Adults  A healthy lifestyle and preventive care can promote  health and wellness. Preventive health guidelines for adults include the following key practices.  . A routine yearly physical is a good way to check with your health care provider about your health and preventive screening. It is a chance to share any concerns and updates on your health and to receive a thorough exam.  .  Visit your dentist for a routine exam and preventive care every 6 months. Brush your teeth twice a day and floss once a day. Good oral hygiene prevents tooth decay and gum disease.  . The frequency of eye exams is based on your age, health, family medical history, use  of contact lenses, and other factors. Follow your health care provider's ecommendations for frequency of eye exams.  . Eat a healthy diet. Foods like vegetables, fruits, whole grains, low-fat dairy products, and lean protein foods contain the nutrients you need without too many calories. Decrease your intake of foods high in solid fats, added sugars, and salt. Eat the right amount of calories for you. Get information about a proper diet from your health care provider, if necessary.  . Regular physical exercise is one of the most important things you can do for your health. Most adults should get at least 150 minutes of moderate-intensity exercise (any activity that increases your heart rate and causes you to sweat) each week. In addition, most adults need muscle-strengthening exercises on 2 or more days a week.  Silver Sneakers may be a benefit available to you. To determine eligibility, you may visit the website: www.silversneakers.com or contact program at 605-613-4229 Mon-Fri between 8AM-8PM.   . Maintain a healthy weight. The body mass index (BMI) is a screening tool to identify possible weight problems. It provides an estimate of body fat based on height and weight. Your health care provider can find your BMI and can help you achieve or maintain a healthy weight.   For adults 20 years and older: ? A BMI below  18.5 is considered underweight. ? A BMI of 18.5 to 24.9 is normal. ? A BMI of 25 to 29.9 is considered overweight. ? A BMI of 30 and above is considered obese.   . Maintain normal blood lipids and cholesterol levels by exercising and minimizing your intake of saturated fat. Eat a balanced diet with plenty of fruit and vegetables. Blood tests for lipids and cholesterol should begin at age 6 and be repeated every 5 years. If your lipid or cholesterol levels are high, you are over 50, or you are at high risk for heart disease, you may need your cholesterol levels checked more frequently. Ongoing high lipid and cholesterol levels should be treated with medicines if diet and exercise are not working.  . If you smoke, find out from your health care provider how to quit. If you do not use tobacco, please do not start.  . If you choose to drink alcohol, please do not consume more than 2 drinks per day. One drink is considered to be 12 ounces (355 mL) of beer, 5 ounces (148 mL) of wine, or 1.5 ounces (44 mL) of liquor.  . If you are 34-30 years old, ask your health care provider if you should take aspirin to prevent strokes.  . Use sunscreen. Apply sunscreen liberally and repeatedly throughout the day. You should seek shade when your shadow is shorter than you. Protect yourself by wearing long sleeves, pants, a wide-brimmed hat, and sunglasses year round, whenever you are outdoors.  . Once a month, do a whole body skin exam, using a mirror to look at the skin on your back. Tell your health care provider of new moles, moles that have irregular borders, moles that are larger than a pencil eraser, or moles that have changed in shape or color.

## 2017-04-17 NOTE — Telephone Encounter (Signed)
I spoke with the pt, tessalon perles, phenergan dm and protonix are precribed

## 2017-05-15 ENCOUNTER — Other Ambulatory Visit: Payer: Self-pay | Admitting: Family Medicine

## 2017-05-23 ENCOUNTER — Ambulatory Visit: Payer: Self-pay | Admitting: Family Medicine

## 2017-05-23 ENCOUNTER — Ambulatory Visit (INDEPENDENT_AMBULATORY_CARE_PROVIDER_SITE_OTHER): Payer: Medicare Other | Admitting: Family Medicine

## 2017-05-23 ENCOUNTER — Encounter: Payer: Self-pay | Admitting: Family Medicine

## 2017-05-23 VITALS — BP 118/84 | HR 100 | Resp 16 | Ht 67.0 in | Wt 235.0 lb

## 2017-05-23 DIAGNOSIS — K219 Gastro-esophageal reflux disease without esophagitis: Secondary | ICD-10-CM

## 2017-05-23 DIAGNOSIS — N39498 Other specified urinary incontinence: Secondary | ICD-10-CM

## 2017-05-23 DIAGNOSIS — I10 Essential (primary) hypertension: Secondary | ICD-10-CM

## 2017-05-23 DIAGNOSIS — F418 Other specified anxiety disorders: Secondary | ICD-10-CM

## 2017-05-23 DIAGNOSIS — R7302 Impaired glucose tolerance (oral): Secondary | ICD-10-CM | POA: Diagnosis not present

## 2017-05-23 MED ORDER — MIRABEGRON ER 25 MG PO TB24: 25.0000 mg | ORAL_TABLET | Freq: Every day | ORAL | 5 refills | Status: DC

## 2017-05-23 NOTE — Progress Notes (Signed)
Jenna Woods     MRN: [id number]      DOB: 09-15-1960   HPI Jenna Woods is here for follow up and re-evaluation of chronic medical conditions, medication management and review of any available recent lab and radiology data.  Preventive health is updated, specifically  Cancer screening and Immunization.   Questions or concerns regarding consultations or procedures which the PT has had in the interim are  addressed. The PT denies any adverse reactions to current medications since the last visit.  Needs medication for incontinence changed due to insurance coverage Has not been taking her trazodone, states she just uses xanax for sleep Stressed in past 2 weeks , since she found out that her grandson's mother has a warrant out for her since 2016, tearful and repressed, not suicidal or homicidal  ROS Denies recent fever or chills. Denies sinus pressure, nasal congestion, ear pain or sore throat. Denies chest congestion, productive cough or wheezing. Denies chest pains, palpitations and leg swelling Denies abdominal pain, nausea, vomiting,diarrhea or constipation.   Denies dysuria, frequency, hesitancy or incontinence. Denies joint pain, swelling and limitation in mobility. Denies headaches, seizures, numbness, or tingling.  Denies skin break down or rash.   PE  BP 118/84   Pulse 100   Resp 16   Ht 5\' 7"  (1.702 m)   Wt 235 lb (106.6 kg)   SpO2 97%   BMI 36.81 kg/m   Patient alert and oriented and in no cardiopulmonary distress.  HEENT: No facial asymmetry, EOMI,   oropharynx pink and moist.  Neck supple no JVD, no mass.  Chest: Clear to auscultation bilaterally.  CVS: S1, S2 no murmurs, no S3.Regular rate.  ABD: Soft non tender.   Ext: No edema  MS: Adequate ROM spine, shoulders, hips and knees.  Skin: Intact, no ulcerations or rash noted.  Psych: Good eye contact, normal affect. Memory intact , tearful , anxious and  depressed appearing.  CNS: CN 2-12 intact,  power,  normal throughout.no focal deficits noted.   Assessment & Plan  Depression with anxiety Uncontrolled , needs to get therapy, continue with psychiatry as before, she is agreeable to this and she is referred She is not suicidal or homicidal  Hypertension Controlled, no change in medication DASH diet and commitment to daily physical activity for a minimum of 30 minutes discussed and encouraged, as a part of hypertension management. The importance of attaining a healthy weight is also discussed.  BP/Weight 05/23/2017 04/17/2017 03/30/2017 12/20/2016 09/20/2016 07/29/2016 10/14/1749  Systolic BP 025 852 778 242 353 614 -  Diastolic BP 84 74 90 78 80 56 -  Wt. (Lbs) 235 235 230.9 233.5 224 - 229.1  BMI 36.81 35.73 35.11 36.57 35.08 - 35.88  Some encounter information is confidential and restricted. Go to Review Flowsheets activity to see all data.       IGT (impaired glucose tolerance)    Urinary incontinence Need to change medication due to insurance coverage  Morbid obesity (HCC) Unchanged. Patient re-educated about  the importance of commitment to a  minimum of 150 minutes of exercise per week.  The importance of healthy food choices with portion control discussed. Encouraged to start a food diary, count calories and to consider  joining a support group. Sample diet sheets offered. Goals set by the patient for the next several months.   Weight /BMI 05/23/2017 04/17/2017 03/30/2017  WEIGHT 235 lb 235 lb 230 lb 14.4 oz  HEIGHT 5\' 7"  - 5\' 8"   BMI 36.81 kg/m2 35.73 kg/m2 35.11 kg/m2  Some encounter information is confidential and restricted. Go to Review Flowsheets activity to see all data.      GERD (gastroesophageal reflux disease) Controlled, no change in medication

## 2017-05-23 NOTE — Patient Instructions (Addendum)
F/u in 8 to 10 weeks , call if you need me sooner  After you finish  Toviaz, I have sent in Myrbetriq one daily for incontinence  I have referred you to teletherapy for depression, continue to see Dr Harrington Challenger as before, and I do recommend you resume the trazodone or discuss some oter medication for depression with her  Fasting ;lipid, chem 7 and EGFR and hBA1C 1 week before next visit  Prayers, and I hope that your situation improves  It is important that you exercise regularly at least 30 minutes 5 times a week. If you develop chest pain, have severe difficulty breathing, or feel very tired, stop exercising immediately and seek medical attention  Thank you  for choosing [facility]. We consider it a privelige to serve you.  Delivering excellent health care in a caring and  compassionate way is our goal.  Partnering with you,  so that together we can achieve this goal is our strategy.

## 2017-05-24 ENCOUNTER — Telehealth: Payer: Self-pay

## 2017-05-24 NOTE — Telephone Encounter (Signed)
[facility] - left message.  

## 2017-05-25 ENCOUNTER — Telehealth: Payer: Self-pay

## 2017-05-25 DIAGNOSIS — F419 Anxiety disorder, unspecified: Secondary | ICD-10-CM

## 2017-05-25 NOTE — BH Specialist Note (Signed)
[place] Telephone Follow-up  MRN: [id number] NAME: Jenna Woods Date: 05/25/17  Start time: 1:00 to 1:30pm Total time: 30 minutes Call number: 1/6  Reason for call today: Reason for Contact: Initial Assessment  PHQ-9 Scores:  Depression screen [facility] 2/9 05/25/2017 05/23/2017 04/17/2017 04/18/2016 03/27/2015  Decreased Interest 2 2 0 0 -  Down, Depressed, Hopeless 3 2 3 1  -  PHQ - 2 Score 5 4 3 1  -  Altered sleeping 1 0 0 - 0  Tired, decreased energy 1 0 0 - -  Change in appetite 0 3 3 - -  Feeling bad or failure about yourself  3 2 0 - -  Trouble concentrating 1 3 1  - -  Moving slowly or fidgety/restless 0 3 1 - -  Suicidal thoughts 0 3 0 - -  PHQ-9 Score 11 18 8  - -  Difficult doing work/chores Somewhat difficult - Somewhat difficult - -  Some recent data might be hidden   GAD-7 Scores:  GAD 7 : Generalized Anxiety Score 05/25/2017  Nervous, Anxious, on Edge 2  Control/stop worrying 2  Worry too much - different things 3  Trouble relaxing 1  Restless 1  Easily annoyed or irritable 2  Afraid - awful might happen 2  Total GAD 7 Score 13    Stress Current stressors: Current Stressors: Other (Comment)(Upcoming court dates ) Sleep: Sleep: Difficulty staying asleep Appetite: Appetite: No problems Coping ability: Coping ability: Overwhelmed Patient taking medications as prescribed: Patient taking medications as prescribed: Yes  Current medications:  Outpatient Encounter Medications as of 05/25/2017  Medication Sig  . ALPRAZolam (XANAX) 1 MG tablet Take 1 tablet (1 mg total) by mouth 3 (three) times daily.  Marland Kitchen amLODipine (NORVASC) 10 MG tablet TAKE ONE (1) TABLET BY MOUTH EVERY DAY  . Calcium Carbonate-Vit D-Min (CALCIUM 1200) 1200-1000 MG-UNIT CHEW Chew 1 tablet by mouth daily.  . Cholecalciferol (VITAMIN D) 2000 units CAPS Omne daily  . ferrous sulfate (CVS IRON) 325 (65 FE) MG tablet Take 1 tablet (325 mg total) by mouth 2 (two) times daily with a meal.  .  mirabegron ER (MYRBETRIQ) 25 MG TB24 tablet Take 1 tablet (25 mg total) by mouth daily.  . montelukast (SINGULAIR) 10 MG tablet TAKE ONE TABLET BY MOUTH EVERY NIGHT AT BEDTIME  . pantoprazole (PROTONIX) 20 MG tablet Take 1 tablet (20 mg total) by mouth daily.  . traZODone (DESYREL) 100 MG tablet Take 1 tablet (100 mg total) by mouth at bedtime as needed for sleep. (Patient not taking: Reported on 05/23/2017)   No facility-administered encounter medications on file as of 05/25/2017.      Self-harm Behaviors Risk Assessment Self-harm risk factors:   Patient endorses recent thoughts of harming self: Have you recently had any thoughts about harming yourself?: No  [place] Suicide Severity Rating Scale: No flowsheet data found. No flowsheet data found.   Danger to Others Risk Assessment Danger to others risk factors: Danger to Others Risk Factors: No risk factors noted Patient endorses recent thoughts of harming others: Notification required: No need or identified person  Dynamic Appraisal of Situational Aggression (DASA): No flowsheet data found.    Goals, Interventions and Follow-up Plan Goals: Increase healthy adjustment to current life circumstances Interventions: Motivational Interviewing and Supportive Counseling Follow-up Plan: Journalig accomplishments in her life and reflecting on what a good person she is.   Summary:   Patient is a 57 year old female that reports depression and anxiety associated with three  upcoming court dates regarding shoplifting, communicating threats and harassing phone calls.  Court dates are on April 1,9 and 19.     Patient denies side effects with medication and reports that the medication is working for her.  Mother died in 10-08-2013. She has an adult son and daughter.  Patient reports that when she takes her medication that she is able to sleep.  Patient denies issues with her appetite.    Patient denies a history of mania.  Patient denies decreased  need for sleep. Patient denies euphoria.  Patient denies past suicide attempts. Patient denies any past or present self-injurious behaviors.  Patient denies a family history of mental illness.  Patient denies a family history of substance abuse.  Patient denies a family history of suicide. Patient denies DUI.  Patient denies insomnia. Patient denies SI/HI.  Patient denies a history of physical and Patient denies AH/VH/paranoia.  Patient denies psychiatric inpatient hospitalization.  Patient reports that she lives alone and enjoys spending time with her grandchildren and her church.    Graciella Freer LaVerne, LCAS-A

## 2017-05-26 ENCOUNTER — Telehealth (HOSPITAL_COMMUNITY): Payer: Self-pay

## 2017-05-26 NOTE — Telephone Encounter (Signed)
Outpatient therapy appt scheduled with Arrie Eastern, LCSW on June 29, 2017.

## 2017-05-28 NOTE — Assessment & Plan Note (Signed)
Controlled, no change in medication  

## 2017-05-28 NOTE — Assessment & Plan Note (Signed)
Need to change medication due to insurance coverage

## 2017-05-28 NOTE — Assessment & Plan Note (Signed)
Controlled, no change in medication DASH diet and commitment to daily physical activity for a minimum of 30 minutes discussed and encouraged, as a part of hypertension management. The importance of attaining a healthy weight is also discussed.  BP/Weight 05/23/2017 04/17/2017 03/30/2017 12/20/2016 09/20/2016 07/29/2016 1/79/1505  Systolic BP 697 948 016 553 748 270 -  Diastolic BP 84 74 90 78 80 56 -  Wt. (Lbs) 235 235 230.9 233.5 224 - 229.1  BMI 36.81 35.73 35.11 36.57 35.08 - 35.88  Some encounter information is confidential and restricted. Go to Review Flowsheets activity to see all data.

## 2017-05-28 NOTE — Assessment & Plan Note (Signed)
Uncontrolled , needs to get therapy, continue with psychiatry as before, she is agreeable to this and she is referred She is not suicidal or homicidal

## 2017-05-28 NOTE — Assessment & Plan Note (Signed)
Unchanged. Patient re-educated about  the importance of commitment to a  minimum of 150 minutes of exercise per week.  The importance of healthy food choices with portion control discussed. Encouraged to start a food diary, count calories and to consider  joining a support group. Sample diet sheets offered. Goals set by the patient for the next several months.   Weight /BMI 05/23/2017 04/17/2017 03/30/2017  WEIGHT 235 lb 235 lb 230 lb 14.4 oz  HEIGHT 5\' 7"  - 5\' 8"   BMI 36.81 kg/m2 35.73 kg/m2 35.11 kg/m2  Some encounter information is confidential and restricted. Go to Review Flowsheets activity to see all data.

## 2017-05-29 ENCOUNTER — Encounter (HOSPITAL_COMMUNITY): Payer: Self-pay | Admitting: Psychiatry

## 2017-06-01 NOTE — Telephone Encounter (Signed)
This encounter was created in error - please disregard.

## 2017-06-05 ENCOUNTER — Other Ambulatory Visit (HOSPITAL_COMMUNITY): Payer: Self-pay | Admitting: Psychiatry

## 2017-06-27 ENCOUNTER — Ambulatory Visit (INDEPENDENT_AMBULATORY_CARE_PROVIDER_SITE_OTHER): Payer: Medicare HMO | Admitting: Psychiatry

## 2017-06-27 ENCOUNTER — Encounter (HOSPITAL_COMMUNITY): Payer: Self-pay | Admitting: Psychiatry

## 2017-06-27 VITALS — BP 116/82 | HR 89 | Ht 67.0 in | Wt 237.0 lb

## 2017-06-27 DIAGNOSIS — Z813 Family history of other psychoactive substance abuse and dependence: Secondary | ICD-10-CM | POA: Diagnosis not present

## 2017-06-27 DIAGNOSIS — R450 Nervousness: Secondary | ICD-10-CM

## 2017-06-27 DIAGNOSIS — F419 Anxiety disorder, unspecified: Secondary | ICD-10-CM

## 2017-06-27 DIAGNOSIS — Z87891 Personal history of nicotine dependence: Secondary | ICD-10-CM

## 2017-06-27 DIAGNOSIS — Z810 Family history of intellectual disabilities: Secondary | ICD-10-CM

## 2017-06-27 DIAGNOSIS — Z736 Limitation of activities due to disability: Secondary | ICD-10-CM | POA: Diagnosis not present

## 2017-06-27 DIAGNOSIS — F331 Major depressive disorder, recurrent, moderate: Secondary | ICD-10-CM | POA: Diagnosis not present

## 2017-06-27 DIAGNOSIS — Z811 Family history of alcohol abuse and dependence: Secondary | ICD-10-CM | POA: Diagnosis not present

## 2017-06-27 MED ORDER — HYDROXYZINE HCL 25 MG PO TABS: 25.0000 mg | ORAL_TABLET | Freq: Three times a day (TID) | ORAL | 2 refills | Status: DC | PRN

## 2017-06-27 MED ORDER — ZOLPIDEM TARTRATE 10 MG PO TABS: 10.0000 mg | ORAL_TABLET | Freq: Every evening | ORAL | 2 refills | Status: DC | PRN

## 2017-06-27 MED ORDER — ALPRAZOLAM 1 MG PO TABS: 1.0000 mg | ORAL_TABLET | Freq: Three times a day (TID) | ORAL | 0 refills | Status: DC

## 2017-06-27 NOTE — Progress Notes (Signed)
BH MD/PA/NP OP Progress Note  07/10/3974 73:41 PM Jenna Woods  MRN:  [id number]  Chief Complaint:  Chief Complaint    Depression; Anxiety; Follow-up     HPI: This patient is a 57 year old divorced black female who lives alone in [place].  She has 2 grown sons.  She is on disability for lupus.  The patient returns after 4 months for follow-up regarding depression and anxiety.  She states that things got worse after March 15.  On that date rocking him [place] called her and stated that a warrant against her that was dated back to 2015.  This was taken out by the mother of her grandson for "harassment."  Since then she has had to go to court several times but the other woman is never shown up hopefully this will be dropped.  She also had a warrant against her for threatening a woman who was propositioning her boyfriend and she is gone to court and [facility] several times and the other woman has not shown up.  All of this has wrecked havoc with her nerves.  She does have Xanax to take and she is supposed to take 1 mg 3 times a day and sometimes she takes extra dose.  I told her this is not good and it can lead to an addiction problem and I cannot refill it early.  She voices understanding.  She states now that she is going to the court process in season and in sight things are getting better.  She does have support from family and from her fianc.  She denies current suicidal ideation.  She is very stressed and I offered to add hydroxyzine for anxiety.  The trazodone is not helping her sleep so we will switch to Ambien Visit Diagnosis:    ICD-10-CM   1. Major depressive disorder, recurrent episode, moderate (HCC) F33.1     Past Psychiatric History: none  Past Medical History:  Past Medical History:  Diagnosis Date  . Allergic reaction   . Allergy    perrenial   . Anxiety   . Arthritis   . Depression    h/o suicidal ideation in 2011  . Depression with  anxiety   . Diverticulosis of colon 12/19/2015  . Diverticulosis of colon with hemorrhage 12/19/2015  . GERD (gastroesophageal reflux disease)   . Hypertension 1996  . Obesity   . Peripheral vascular disease ([place])   . Prediabetes 2013  . Scleroderma ([place]) 1998  . Systemic lupus erythematosus ([place]) 1998   [facility]  . Tobacco abuse, in remission    10-pack-years discontinued in 2007  . Urinary frequency     Past Surgical History:  Procedure Laterality Date  . ABDOMINAL HYSTERECTOMY  1990   Neoplasm  . CESAREAN SECTION     x2  . COLONOSCOPY  2005  . COLONOSCOPY WITH PROPOFOL N/A 08/28/2015   Dr. Laural Golden: scattered medium-mouth diverticula entire colon, external hemorrhoids  . COLONOSCOPY WITH PROPOFOL N/A 07/27/2016   Procedure: COLONOSCOPY WITH PROPOFOL;  Surgeon: Rogene Houston, MD;  [facility];  Service: Endoscopy;  Laterality: N/A;  . FINGER AMPUTATION     Third finger, bilateral, distal  . NASAL SINUS SURGERY  09/06/2011   Procedure: ENDOSCOPIC SINUS SURGERY;  Surgeon: Ascencion Dike, MD;  Location: [facility];  Service: ENT;  Laterality: N/A;  Nasal cerebrospinal fluid leak repair with Left temporalis fascia graft and Left Ear cartilage graft  . VASCULAR SURGERY  Hands, bilaterally, [facility]; right hand partial amputation of middle finger.    Family Psychiatric History: See below  Family History:  Family History  Problem Relation Age of Onset  . Arthritis Mother        Rheumatoid  . Dementia Mother   . Hypertension Mother   . Cirrhosis Father   . Alcohol abuse Father   . Arthritis Maternal Grandmother   . Drug abuse Sister   . Graves' disease Son   . Colon cancer Neg Hx     Social History:  Social History   Socioeconomic History  . Marital status: Single    Spouse name: Not on file  . Number of children: Not on file  . Years of education: Not on file  . Highest education level: Not on file  Occupational History   . Occupation: Art therapist: [employer]    Comment: Disability awarded in [place]  . Financial resource strain: Hard  . Food insecurity:    Worry: Often true    Inability: Never true  . Transportation needs:    Medical: No    Non-medical: No  Tobacco Use  . Smoking status: Former Smoker    Packs/day: 0.25    Years: 25.00    Pack years: 6.25    Types: Cigarettes    Last attempt to quit: 04/07/2001    Years since quitting: 16.2  . Smokeless tobacco: Never Used  Substance and Sexual Activity  . Alcohol use: No    Alcohol/week: 0.0 oz    Comment: quit in 2004  . Drug use: No    Comment: use to use pot   . Sexual activity: Yes    Birth control/protection: Surgical  Lifestyle  . Physical activity:    Days per week: 3 days    Minutes per session: 50 min  . Stress: Only a little  Relationships  . Social connections:    Talks on phone: More than three times a week    Gets together: Patient refused    Attends religious service: More than 4 times per year    Active member of club or organization: Yes    Attends meetings of clubs or organizations: More than 4 times per year    Relationship status: Separated  Other Topics Concern  . Not on file  Social History Narrative  . Not on file    Allergies:  Allergies  Allergen Reactions  . Bee Venom Anaphylaxis  . Sesame Oil Anaphylaxis and Swelling    (Sesame seed)  . Aspirin     Diverticulosis with bleed  . Molds & Smuts   . Codeine Itching and Rash    Metabolic Disorder Labs: Lab Results  Component Value Date   HGBA1C 5.5 06/30/2016   MPG 111 06/30/2016   MPG 123 12/19/2015   No results found for: PROLACTIN Lab Results  Component Value Date   CHOL 114 06/30/2016   TRIG 76 06/30/2016   HDL 43 (L) 06/30/2016   CHOLHDL 2.7 06/30/2016   VLDL 15 06/30/2016   LDLCALC 56 06/30/2016   LDLCALC 45 04/02/2015   Lab Results  Component Value Date   TSH 1.10 12/13/2016   TSH 0.444  12/19/2015    Therapeutic Level Labs: No results found for: LITHIUM No results found for: VALPROATE No components found for:  CBMZ  Current Medications: Current Outpatient Medications  Medication Sig Dispense Refill  . ALPRAZolam (XANAX) 1 MG tablet TAKE ONE TABLET (  1MG  TOTAL) BY MOUTH THREE TIMES DAILY 90 tablet 0  . amLODipine (NORVASC) 10 MG tablet TAKE ONE (1) TABLET BY MOUTH EVERY DAY 90 tablet 1  . Calcium Carbonate-Vit D-Min (CALCIUM 1200) 1200-1000 MG-UNIT CHEW Chew 1 tablet by mouth daily.    . Cholecalciferol (VITAMIN D) 2000 units CAPS Omne daily 30 capsule   . ferrous sulfate (CVS IRON) 325 (65 FE) MG tablet Take 1 tablet (325 mg total) by mouth 2 (two) times daily with a meal. 60 tablet 3  . mirabegron ER (MYRBETRIQ) 25 MG TB24 tablet Take 1 tablet (25 mg total) by mouth daily. 30 tablet 5  . montelukast (SINGULAIR) 10 MG tablet TAKE ONE TABLET BY MOUTH EVERY NIGHT AT BEDTIME 90 tablet 1  . pantoprazole (PROTONIX) 20 MG tablet Take 1 tablet (20 mg total) by mouth daily. 30 tablet 5  . ALPRAZolam (XANAX) 1 MG tablet Take 1 tablet (1 mg total) by mouth 3 (three) times daily. 90 tablet 0  . hydrOXYzine (ATARAX/VISTARIL) 25 MG tablet Take 1 tablet (25 mg total) by mouth 3 (three) times daily as needed. 90 tablet 2  . zolpidem (AMBIEN) 10 MG tablet Take 1 tablet (10 mg total) by mouth at bedtime as needed for sleep. 30 tablet 2   No current facility-administered medications for this visit.      Musculoskeletal: Strength & Muscle Tone: within normal limits Gait & Station: normal Patient leans: N/A  Psychiatric Specialty Exam: Review of Systems  Psychiatric/Behavioral: Positive for depression. The patient is nervous/anxious and has insomnia.   All other systems reviewed and are negative.   Blood pressure 116/82, pulse 89, height 5\' 7"  (1.702 m), weight 237 lb (107.5 kg), SpO2 97 %.Body mass index is 37.12 kg/m.  General Appearance: Casual and Fairly Groomed  Eye Contact:   Good  Speech:  Clear and Coherent  Volume:  Normal  Mood:  Anxious  Affect:  Congruent  Thought Process:  Goal Directed  Orientation:  Full (Time, Place, and Person)  Thought Content: Rumination   Suicidal Thoughts:  No  Homicidal Thoughts:  No  Memory:  Immediate;   Good Recent;   Good Remote;   Good  Judgement:  Fair  Insight:  Fair  Psychomotor Activity:  Decreased  Concentration:  Concentration: Fair and Attention Span: Fair  Recall:  Good  Fund of Knowledge: Good  Language: Good  Akathisia:  No  Handed:  Right  AIMS (if indicated): not done  Assets:  Communication Skills Desire for Improvement Resilience Social Support Talents/Skills  ADL's:  Intact  Cognition: WNL  Sleep:  Poor   Screenings: GAD-7     Virtual [facility] Phone Follow Up from 05/25/2017 in [place]  Total GAD-7 Score  13    PHQ2-9     Virtual [place] Phone Follow Up from 05/25/2017 in [place] Office Visit from 05/23/2017 in [place] from 04/17/2017 in [place] from 04/18/2016 in [facility] Office Visit from 03/27/2015 in [facility]  PHQ-2 Total Score  5  4  3  1  6   PHQ-9 Total Score  11  18  8   -  22       Assessment and Plan: This patient is a 57 year old female with a history of depression and anxiety.  She is primarily anxious right now because of the situation with the courts.  However she seems to be handling it fairly well.  She will  continue Xanax 1 mg 3 times daily and I will not increase the dosage or refill it early this has been explained.  She will also start hydroxyzine 25 mg up to 3 times a day for anxiety and will also start Ambien 10 mg at bedtime for sleep.  She is starting counseling here and she will also return to see me in 4 weeks   Levonne Spiller, MD 06/27/2017, 12:06 PM

## 2017-06-29 ENCOUNTER — Ambulatory Visit (HOSPITAL_COMMUNITY): Payer: Self-pay | Admitting: Licensed Clinical Social Worker

## 2017-07-20 ENCOUNTER — Ambulatory Visit (INDEPENDENT_AMBULATORY_CARE_PROVIDER_SITE_OTHER): Payer: Medicare HMO | Admitting: Licensed Clinical Social Worker

## 2017-07-20 ENCOUNTER — Ambulatory Visit (HOSPITAL_COMMUNITY): Payer: Self-pay | Admitting: Licensed Clinical Social Worker

## 2017-07-20 DIAGNOSIS — F331 Major depressive disorder, recurrent, moderate: Secondary | ICD-10-CM | POA: Diagnosis not present

## 2017-07-21 ENCOUNTER — Encounter (HOSPITAL_COMMUNITY): Payer: Self-pay | Admitting: Licensed Clinical Social Worker
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
## 2017-07-25 ENCOUNTER — Ambulatory Visit: Payer: Self-pay | Admitting: Family Medicine

## 2017-07-27 ENCOUNTER — Encounter (HOSPITAL_COMMUNITY): Payer: Self-pay | Admitting: Psychiatry

## 2017-07-27 ENCOUNTER — Ambulatory Visit (INDEPENDENT_AMBULATORY_CARE_PROVIDER_SITE_OTHER): Payer: Medicare HMO | Admitting: Psychiatry

## 2017-07-27 VITALS — BP 119/81 | HR 90 | Ht 67.0 in | Wt 244.0 lb

## 2017-07-27 DIAGNOSIS — R450 Nervousness: Secondary | ICD-10-CM

## 2017-07-27 DIAGNOSIS — M2550 Pain in unspecified joint: Secondary | ICD-10-CM | POA: Diagnosis not present

## 2017-07-27 DIAGNOSIS — Z811 Family history of alcohol abuse and dependence: Secondary | ICD-10-CM

## 2017-07-27 DIAGNOSIS — Z736 Limitation of activities due to disability: Secondary | ICD-10-CM | POA: Diagnosis not present

## 2017-07-27 DIAGNOSIS — Z810 Family history of intellectual disabilities: Secondary | ICD-10-CM

## 2017-07-27 DIAGNOSIS — F331 Major depressive disorder, recurrent, moderate: Secondary | ICD-10-CM

## 2017-07-27 DIAGNOSIS — Z87891 Personal history of nicotine dependence: Secondary | ICD-10-CM | POA: Diagnosis not present

## 2017-07-27 DIAGNOSIS — F419 Anxiety disorder, unspecified: Secondary | ICD-10-CM | POA: Diagnosis not present

## 2017-07-27 DIAGNOSIS — Z813 Family history of other psychoactive substance abuse and dependence: Secondary | ICD-10-CM

## 2017-07-27 MED ORDER — HYDROXYZINE HCL 25 MG PO TABS: 25.0000 mg | ORAL_TABLET | Freq: Three times a day (TID) | ORAL | 2 refills | Status: DC | PRN

## 2017-07-27 MED ORDER — ALPRAZOLAM 1 MG PO TABS: 1.0000 mg | ORAL_TABLET | Freq: Three times a day (TID) | ORAL | 2 refills | Status: DC

## 2017-07-27 MED ORDER — ZOLPIDEM TARTRATE 10 MG PO TABS: 10.0000 mg | ORAL_TABLET | Freq: Every evening | ORAL | 2 refills | Status: DC | PRN

## 2017-07-27 NOTE — Progress Notes (Signed)
BH MD/PA/NP OP Progress Note  05/20/1759 6:07 PM Jenna Woods  MRN:  [id number]  Chief Complaint:  Chief Complaint    Depression; Anxiety; Follow-up     HPI: This patient is a 57 year old divorced black female who lives alone in [place].  She has 2 grown sons.  She is on disability for lupus.  The patient returns after 4 weeks for follow-up regarding depression and anxiety.  She  had told me last time that she had been served several warrants for things that happened back in 2015.  The opposing parties had not shown up for any of these and she still has to go back to court next month.  She was very distraught and was overusing the Xanax.  She now states that she is only using it 1 mg 3 times daily as prescribed.  I given her to hydroxyzine as well which is helped a little bit she seen the most improvement in her sleep with Ambien and feels much more rested and less upset.  Unfortunately her brother died last night.  He been very ill from scleroderma. Visit Diagnosis:    ICD-10-CM   1. Major depressive disorder, recurrent episode, moderate (HCC) F33.1     Past Psychiatric History: none  Past Medical History:  Past Medical History:  Diagnosis Date  . Allergic reaction   . Allergy    perrenial   . Anxiety   . Arthritis   . Depression    h/o suicidal ideation in 2011  . Depression with anxiety   . Diverticulosis of colon 12/19/2015  . Diverticulosis of colon with hemorrhage 12/19/2015  . GERD (gastroesophageal reflux disease)   . Hypertension 1996  . Obesity   . Peripheral vascular disease ([place])   . Prediabetes 2013  . Scleroderma ([place]) 1998  . Systemic lupus erythematosus ([place]) 1998   Treated at [facility]  . Tobacco abuse, in remission    10-pack-years discontinued in 2007  . Urinary frequency     Past Surgical History:  Procedure Laterality Date  . ABDOMINAL HYSTERECTOMY  1990   Neoplasm  . CESAREAN SECTION     x2  . COLONOSCOPY  2005  . COLONOSCOPY WITH  PROPOFOL N/A 08/28/2015   Dr. Laural Golden: scattered medium-mouth diverticula entire colon, external hemorrhoids  . COLONOSCOPY WITH PROPOFOL N/A 07/27/2016   Procedure: COLONOSCOPY WITH PROPOFOL;  Surgeon: Rogene Houston, MD;  [facility];  Service: Endoscopy;  Laterality: N/A;  . FINGER AMPUTATION     Third finger, bilateral, distal  . NASAL SINUS SURGERY  09/06/2011   Procedure: ENDOSCOPIC SINUS SURGERY;  Surgeon: Ascencion Dike, MD;  Location: [place];  Service: ENT;  Laterality: N/A;  Nasal cerebrospinal fluid leak repair with Left temporalis fascia graft and Left Ear cartilage graft  . VASCULAR SURGERY     Hands, bilaterally, [facility]; right hand partial amputation of middle finger.    Family Psychiatric History: See below  Family History:  Family History  Problem Relation Age of Onset  . Arthritis Mother        Rheumatoid  . Dementia Mother   . Hypertension Mother   . Cirrhosis Father   . Alcohol abuse Father   . Arthritis Maternal Grandmother   . Drug abuse Sister   . Graves' disease Son   . Colon cancer Neg Hx     Social History:  Social History   Socioeconomic History  . Marital status: Single    Spouse name: Not  on file  . Number of children: Not on file  . Years of education: Not on file  . Highest education level: Not on file  Occupational History  . Occupation: Art therapist: [employer]    Comment: Disability awarded in [place]  . Financial resource strain: Hard  . Food insecurity:    Worry: Often true    Inability: Never true  . Transportation needs:    Medical: No    Non-medical: No  Tobacco Use  . Smoking status: Former Smoker    Packs/day: 0.25    Years: 25.00    Pack years: 6.25    Types: Cigarettes    Last attempt to quit: 04/07/2001    Years since quitting: 16.3  . Smokeless tobacco: Never Used  Substance and Sexual Activity  . Alcohol use: No    Alcohol/week: 0.0 oz     Comment: quit in 2004  . Drug use: No    Comment: use to use pot   . Sexual activity: Yes    Birth control/protection: Surgical  Lifestyle  . Physical activity:    Days per week: 3 days    Minutes per session: 50 min  . Stress: Only a little  Relationships  . Social connections:    Talks on phone: More than three times a week    Gets together: Patient refused    Attends religious service: More than 4 times per year    Active member of club or organization: Yes    Attends meetings of clubs or organizations: More than 4 times per year    Relationship status: Separated  Other Topics Concern  . Not on file  Social History Narrative  . Not on file    Allergies:  Allergies  Allergen Reactions  . Bee Venom Anaphylaxis  . Sesame Oil Anaphylaxis and Swelling    (Sesame seed)  . Aspirin     Diverticulosis with bleed  . Molds & Smuts   . Codeine Itching and Rash    Metabolic Disorder Labs: Lab Results  Component Value Date   HGBA1C 5.5 06/30/2016   MPG 111 06/30/2016   MPG 123 12/19/2015   No results found for: PROLACTIN Lab Results  Component Value Date   CHOL 114 06/30/2016   TRIG 76 06/30/2016   HDL 43 (L) 06/30/2016   CHOLHDL 2.7 06/30/2016   VLDL 15 06/30/2016   LDLCALC 56 06/30/2016   LDLCALC 45 04/02/2015   Lab Results  Component Value Date   TSH 1.10 12/13/2016   TSH 0.444 12/19/2015    Therapeutic Level Labs: No results found for: LITHIUM No results found for: VALPROATE No components found for:  CBMZ  Current Medications: Current Outpatient Medications  Medication Sig Dispense Refill  . ALPRAZolam (XANAX) 1 MG tablet Take 1 tablet (1 mg total) by mouth 3 (three) times daily. 90 tablet 2  . amLODipine (NORVASC) 10 MG tablet TAKE ONE (1) TABLET BY MOUTH EVERY DAY 90 tablet 1  . Calcium Carbonate-Vit D-Min (CALCIUM 1200) 1200-1000 MG-UNIT CHEW Chew 1 tablet by mouth daily.    . Cholecalciferol (VITAMIN D) 2000 units CAPS Omne daily 30 capsule   .  ferrous sulfate ([employer] IRON) 325 (65 FE) MG tablet Take 1 tablet (325 mg total) by mouth 2 (two) times daily with a meal. 60 tablet 3  . hydrOXYzine (ATARAX/VISTARIL) 25 MG tablet Take 1 tablet (25 mg total) by mouth 3 (three) times daily as needed. [place]  tablet 2  . mirabegron ER (MYRBETRIQ) 25 MG TB24 tablet Take 1 tablet (25 mg total) by mouth daily. 30 tablet 5  . montelukast (SINGULAIR) 10 MG tablet TAKE ONE TABLET BY MOUTH EVERY NIGHT AT BEDTIME 90 tablet 1  . pantoprazole (PROTONIX) 20 MG tablet Take 1 tablet (20 mg total) by mouth daily. 30 tablet 5  . zolpidem (AMBIEN) 10 MG tablet Take 1 tablet (10 mg total) by mouth at bedtime as needed for sleep. 30 tablet 2   No current facility-administered medications for this visit.      Musculoskeletal: Strength & Muscle Tone: within normal limits Gait & Station: normal Patient leans: N/A  Psychiatric Specialty Exam: Review of Systems  Musculoskeletal: Positive for joint pain.  Psychiatric/Behavioral: The patient is nervous/anxious.     Blood pressure 119/81, pulse 90, height 5\' 7"  (1.702 m), weight 244 lb (110.7 kg), SpO2 97 %.Body mass index is 38.22 kg/m.  General Appearance: Casual and Fairly Groomed  Eye Contact:  Good  Speech:  Clear and Coherent  Volume:  Normal  Mood:  Anxious  Affect:  Congruent  Thought Process:  Goal Directed  Orientation:  Full (Time, Place, and Person)  Thought Content: Rumination   Suicidal Thoughts:  No  Homicidal Thoughts:  No  Memory:  Immediate;   Good Recent;   Good Remote;   Good  Judgement:  Fair  Insight:  Fair  Psychomotor Activity:  Normal  Concentration:  Concentration: Good and Attention Span: Good  Recall:  Good  Fund of Knowledge: Good  Language: Good  Akathisia:  No  Handed:  Right  AIMS (if indicated): not done  Assets:  Communication Skills Desire for Improvement Resilience Social Support Talents/Skills  ADL's:  Intact  Cognition: WNL  Sleep:  Good   Screenings: GAD-7      Virtual [facility] Phone Follow Up from 05/25/2017 in [place]  Total GAD-7 Score  13    PHQ2-9     Virtual [place] Phone Follow Up from 05/25/2017 in [place] Office Visit from 05/23/2017 in [place] from 04/17/2017 in [place] from 04/18/2016 in [facility] Office Visit from 03/27/2015 in [facility]  PHQ-2 Total Score  5  4  3  1  6   PHQ-9 Total Score  11  18  8   -  22       Assessment and Plan: This patient is a 57 year old female with a history of depression and anxiety.  She is been very stressed recently with all these court cases coming up.  She seems to be doing better and is not overusing Xanax anymore.  She will continue Xanax 1 mg 3 times daily for anxiety, hydroxyzine 25 mg 3 times daily as needed for anxiety and Ambien 10 mg at bedtime for sleep.  She will continue her counseling and return to see me in 6 weeks.   Levonne Spiller, MD 07/27/2017, 4:25 PM

## 2017-08-19 ENCOUNTER — Other Ambulatory Visit: Payer: Self-pay | Admitting: Family Medicine

## 2017-08-24 NOTE — Telephone Encounter (Signed)
Error in charting.

## 2017-08-28 DIAGNOSIS — R7302 Impaired glucose tolerance (oral): Secondary | ICD-10-CM | POA: Diagnosis not present

## 2017-08-29 ENCOUNTER — Ambulatory Visit: Payer: Self-pay | Admitting: Family Medicine

## 2017-08-29 LAB — COMPLETE METABOLIC PANEL WITH GFR
AG Ratio: 1.4 (calc) (ref 1.0–2.5)
ALKALINE PHOSPHATASE (APISO): 75 U/L (ref 33–130)
ALT: 11 U/L (ref 6–29)
AST: 12 U/L (ref 10–35)
Albumin: 3.9 g/dL (ref 3.6–5.1)
BUN: 10 mg/dL (ref 7–25)
CALCIUM: 9.5 mg/dL (ref 8.6–10.4)
CO2: 29 mmol/L (ref 20–32)
CREATININE: 0.82 mg/dL (ref 0.50–1.05)
Chloride: 105 mmol/L (ref 98–110)
GFR, EST NON AFRICAN AMERICAN: 78 mL/min/{1.73_m2} (ref >=60)
GFR, Est African American: 91 mL/min/{1.73_m2} (ref >=60)
Globulin: 2.7 g/dL (calc) (ref 1.9–3.7)
Glucose, Bld: 109 mg/dL — ABNORMAL HIGH (ref 65–99)
Potassium: 4.2 mmol/L (ref 3.5–5.3)
Sodium: 140 mmol/L (ref 135–146)
Total Bilirubin: 0.3 mg/dL (ref 0.2–1.2)
Total Protein: 6.6 g/dL (ref 6.1–8.1)

## 2017-08-29 LAB — LIPID PANEL
CHOLESTEROL: 126 mg/dL (ref <200)
HDL: 46 mg/dL — ABNORMAL LOW (ref >50)
LDL Cholesterol (Calc): 66 mg/dL (calc)
NON-HDL CHOLESTEROL (CALC): 80 mg/dL (ref <130)
Total CHOL/HDL Ratio: 2.7 (calc) (ref <5.0)
Triglycerides: 59 mg/dL (ref <150)

## 2017-08-29 LAB — HEMOGLOBIN A1C
EAG (MMOL/L): 6.8 (calc)
Hgb A1c MFr Bld: 5.9 % of total Hgb — ABNORMAL HIGH (ref <5.7)
Mean Plasma Glucose: 123 (calc)

## 2017-08-31 ENCOUNTER — Encounter: Payer: Self-pay | Admitting: Family Medicine

## 2017-08-31 ENCOUNTER — Ambulatory Visit (INDEPENDENT_AMBULATORY_CARE_PROVIDER_SITE_OTHER): Payer: Medicare HMO | Admitting: Family Medicine

## 2017-08-31 VITALS — BP 112/80 | HR 84 | Resp 16 | Ht 67.0 in | Wt 240.0 lb

## 2017-08-31 DIAGNOSIS — F418 Other specified anxiety disorders: Secondary | ICD-10-CM

## 2017-08-31 DIAGNOSIS — M349 Systemic sclerosis, unspecified: Secondary | ICD-10-CM

## 2017-08-31 DIAGNOSIS — D8989 Other specified disorders involving the immune mechanism, not elsewhere classified: Secondary | ICD-10-CM

## 2017-08-31 DIAGNOSIS — I10 Essential (primary) hypertension: Secondary | ICD-10-CM

## 2017-08-31 DIAGNOSIS — M329 Systemic lupus erythematosus, unspecified: Secondary | ICD-10-CM

## 2017-08-31 DIAGNOSIS — M359 Systemic involvement of connective tissue, unspecified: Secondary | ICD-10-CM

## 2017-08-31 MED ORDER — LORATADINE 10 MG PO TABS: 10.0000 mg | ORAL_TABLET | Freq: Every day | ORAL | 3 refills | Status: DC

## 2017-08-31 MED ORDER — MONTELUKAST SODIUM 10 MG PO TABS: 10.0000 mg | ORAL_TABLET | Freq: Every day | ORAL | 3 refills | Status: DC

## 2017-08-31 NOTE — Patient Instructions (Addendum)
F/U in 4 . 5  To 5,months. Call if you need me before  Non fasting Chem 7 and EGFR, hBa1C, TSH, Vit D TSH anf CBC 1 week before follow up  Condolence and prayers  Labs are good  Please work on good  health habits so that your health will improve. 1. Commitment to daily physical activity for 30 to 60  minutes, if you are able to do this.  2. Commitment to wise food choices. Aim for half of your  food intake to be vegetable and fruit, one quarter starchy foods, and one quarter protein. Try to eat on a regular schedule  3 meals per day, snacking between meals should be limited to vegetables or fruits or small portions of nuts. 64 ounces of water per day is generally recommended, unless you have specific health conditions, like heart failure or kidney failure where you will need to limit fluid intake.  3. Commitment to sufficient and a  good quality of physical and mental rest daily, generally between 6 to 8 hours per day.  WITH PERSISTANCE AND PERSEVERANCE, THE IMPOSSIBLE , BECOMES THE NORM!  It is important that you exercise regularly at least 30 minutes 5 times a week. If you develop chest pain, have severe difficulty breathing, or feel very tired, stop exercising immediately and seek medical attention   Thank you  for choosing [facility]. We consider it a privelige to serve you.  Delivering excellent health care in a caring and  compassionate way is our goal.  Partnering with you,  so that together we can achieve this goal is our strategy.  You will be referred to a rheumatologist in [place] re you diagnosis of  Scleroderma as we discussed

## 2017-08-31 NOTE — Progress Notes (Signed)
Jenna Woods     MRN: [id number]      DOB: December 24, 1960   HPI Jenna Woods is here for follow up and re-evaluation of chronic medical conditions, medication management and review of any available recent lab and radiology data.  Preventive health is updated, specifically  Cancer screening and Immunization.   Questions or concerns regarding consultations or procedures which the PT has had in the interim are  Addressed. She has increased symptoms of depression due to recent  loss of her brother who had scleroderma. Not suicidal or homicidal , followed by psychiatry, reports poor relationships with her sisters She too has a dx of scleroderma but has not had this followed for years , states she is now ready to have this checked  The PT denies any adverse reactions to current medications since the last visit.   ROS Denies recent fever or chills. Denies sinus pressure, nasal congestion, ear pain or sore throat. Denies chest congestion, productive cough or wheezing. Denies chest pains, palpitations and leg swelling Denies abdominal pain, nausea, vomiting,diarrhea or constipation.   Denies dysuria, frequency, hesitancy or incontinence. Denies joint pain, swelling and limitation in mobility. Denies headaches, seizures, numbness, or tingling.  Denies skin break down or rash.   PE  BP 112/80   Pulse 84   Resp 16   Ht 5\' 7"  (1.702 m)   Wt 240 lb (108.9 kg)   BMI 37.59 kg/m   Patient alert and oriented and in no cardiopulmonary distress.  HEENT: No facial asymmetry, EOMI,   oropharynx pink and moist.  Neck supple no JVD, no mass.  Chest: Clear to auscultation bilaterally.  CVS: S1, S2 no murmurs, no S3.Regular rate.  ABD: Soft non tender.   Ext: No edema  MS: Adequate ROM spine, shoulders, hips and knees.  Skin: Intact, no ulcerations or rash noted.  Psych: Good eye contact, normal affect. Memory intact not anxiousat times tearful and r depressed appearing.  CNS: CN 2-12  intact, power,  normal throughout.no focal deficits noted.   Assessment & Plan  Hypertension Controlled, no change in medication DASH diet and commitment to daily physical activity for a minimum of 30 minutes discussed and encouraged, as a part of hypertension management. The importance of attaining a healthy weight is also discussed.  BP/Weight 08/31/2017 05/23/2017 04/17/2017 03/30/2017 12/20/2016 09/20/2016 0/62/6948  Systolic BP 546 270 350 093 818 299 371  Diastolic BP 80 84 74 90 78 80 56  Wt. (Lbs) 240 235 235 230.9 233.5 224 -  BMI 37.59 36.81 35.73 35.11 36.57 35.08 -  Some encounter information is confidential and restricted. Go to Review Flowsheets activity to see all data.       Morbid obesity ([place]) Deteriorated. Patient re-educated about  the importance of commitment to a  minimum of 150 minutes of exercise per week.  The importance of healthy food choices with portion control discussed. Encouraged to start a food diary, count calories and to consider  joining a support group. Sample diet sheets offered. Goals set by the patient for the next several months.   Weight /BMI 08/31/2017 05/23/2017 04/17/2017  WEIGHT 240 lb 235 lb 235 lb  HEIGHT 5\' 7"  5\' 7"  -  BMI 37.59 kg/m2 36.81 kg/m2 35.73 kg/m2  Some encounter information is confidential and restricted. Go to Review Flowsheets activity to see all data.      Depression with anxiety Increased due to recent loss of her sibling, not suicidal or homicidal, treated by psychiatry and  has upcoming appt, does not feel the need for extra therapy Plans to spend time with her son out of town in the near future   Scleroderma [facility]) Recently lost a brother to complications from scleroderma , states she is now willing to be re evaluated for this disease , also has reported a dx of SLE , will refer to rheumatology for evaluation and management

## 2017-09-05 ENCOUNTER — Encounter (HOSPITAL_COMMUNITY): Payer: Self-pay | Admitting: Licensed Clinical Social Worker

## 2017-09-05 ENCOUNTER — Ambulatory Visit (INDEPENDENT_AMBULATORY_CARE_PROVIDER_SITE_OTHER): Payer: Medicare HMO | Admitting: Licensed Clinical Social Worker

## 2017-09-05 DIAGNOSIS — F331 Major depressive disorder, recurrent, moderate: Secondary | ICD-10-CM

## 2017-09-05 NOTE — Progress Notes (Signed)
   THERAPIST PROGRESS NOTE  Session Time: 10:00 am- 10:40 am  Participation Level: Active  Behavioral Response: CasualAlertAnxious  Type of Therapy: Individual Therapy  Treatment Goals addressed: Coping  Interventions: CBT and Solution Focused  Summary: Jenna Woods is a 57 y.o. female who presents oriented x5 (person, place, situation, time, and object), alert, casually dressed, appropriately groomed, average height, overweight and cooperative to address mood. Patient denies suicidal and homicidal ideations. Patient denies psychosis including auditory and visual hallucinations. Patient denies substance abuse. She is at low risk for lethality.   Physically: Patient has been waking up in cold sweats. She has been stressed over the court dates.  Spiritually/values: Patient continues to be spiritually healthy. Patient credits her faith in keeping her mood regulated.  Relationships: Patient is getting along with others.  Emotional/Mental/Behavior: Patient reported that 2 out of 3 of the cases/charges have been dismissed. She is going back to court at the end of July due to being allegedly caught shoplifting. Patient denies shoplifting. She doesn't remember anything happening. She doesn't remember going to the police station as the warrant suggests. She states she didn't do this and the shoplifting allegedly took place in Dec. 2015 but a warrant was served in 2019. She has continued to go to [place] and no one has said anything to her. She has not been banned. She doesn't know why it took 3 years for the police to "find" her since she hasn't moved and she has gone through police checkpoints. Patient is not focusing on the case and knows that she didn't do anything.   Patient engaged in session. She responded well to interventions. Patient meets criteria for Major Depressive disorder, recurrent episode, moderate. Patient will continue in outpatient therapy due to being the least restrictive  service to meet her needs. Patient made minimal progress on her goals.   Suicidal/Homicidal: Negativewithout intent/plan  Therapist Response: Therapist reviewed patient's recent thoughts and behaviors. Therapist utilized CBT to address mood. Therapist processed patient's feelings to identify triggers for mood. Therapist discussed patient's mood and how she has managed it.   Plan: Return again in 3 weeks.  Diagnosis: Axis I: Major depressive disorder, recurrent episode, moderate    Axis II: No diagnosis    Glori Bickers, LCSW 09/05/2017

## 2017-09-10 ENCOUNTER — Encounter: Payer: Self-pay | Admitting: Family Medicine

## 2017-09-10 DIAGNOSIS — M359 Systemic involvement of connective tissue, unspecified: Secondary | ICD-10-CM | POA: Insufficient documentation

## 2017-09-10 NOTE — Assessment & Plan Note (Signed)
Recently lost a brother to complications from scleroderma , states she is now willing to be re evaluated for this disease , also has reported a dx of SLE , will refer to rheumatology for evaluation and management

## 2017-09-10 NOTE — Assessment & Plan Note (Signed)
History of scleroderma and SLE, needs re evaluation by rheummatology and defiunitive treatment if indicated, recently lost a sibling to scleroderma

## 2017-09-10 NOTE — Assessment & Plan Note (Signed)
Deteriorated. Patient re-educated about  the importance of commitment to a  minimum of 150 minutes of exercise per week.  The importance of healthy food choices with portion control discussed. Encouraged to start a food diary, count calories and to consider  joining a support group. Sample diet sheets offered. Goals set by the patient for the next several months.   Weight /BMI 08/31/2017 05/23/2017 04/17/2017  WEIGHT 240 lb 235 lb 235 lb  HEIGHT 5\' 7"  5\' 7"  -  BMI 37.59 kg/m2 36.81 kg/m2 35.73 kg/m2  Some encounter information is confidential and restricted. Go to Review Flowsheets activity to see all data.

## 2017-09-10 NOTE — Assessment & Plan Note (Signed)
Controlled, no change in medication DASH diet and commitment to daily physical activity for a minimum of 30 minutes discussed and encouraged, as a part of hypertension management. The importance of attaining a healthy weight is also discussed.  BP/Weight 08/31/2017 05/23/2017 04/17/2017 03/30/2017 12/20/2016 09/20/2016 1/94/1740  Systolic BP 814 481 856 314 970 263 785  Diastolic BP 80 84 74 90 78 80 56  Wt. (Lbs) 240 235 235 230.9 233.5 224 -  BMI 37.59 36.81 35.73 35.11 36.57 35.08 -  Some encounter information is confidential and restricted. Go to Review Flowsheets activity to see all data.

## 2017-09-10 NOTE — Assessment & Plan Note (Signed)
Increased due to recent loss of her sibling, not suicidal or homicidal, treated by psychiatry and has upcoming appt, does not feel the need for extra therapy Plans to spend time with her son out of town in the near future

## 2017-09-11 ENCOUNTER — Other Ambulatory Visit: Payer: Self-pay | Admitting: Family Medicine

## 2017-09-12 ENCOUNTER — Ambulatory Visit (INDEPENDENT_AMBULATORY_CARE_PROVIDER_SITE_OTHER): Payer: Medicare HMO | Admitting: Psychiatry

## 2017-09-12 ENCOUNTER — Encounter (HOSPITAL_COMMUNITY): Payer: Self-pay | Admitting: Psychiatry

## 2017-09-12 ENCOUNTER — Other Ambulatory Visit: Payer: Self-pay | Admitting: Family Medicine

## 2017-09-12 VITALS — BP 120/86 | HR 76 | Ht 67.0 in | Wt 237.0 lb

## 2017-09-12 DIAGNOSIS — F331 Major depressive disorder, recurrent, moderate: Secondary | ICD-10-CM | POA: Diagnosis not present

## 2017-09-12 MED ORDER — HYDROXYZINE HCL 25 MG PO TABS: 25.0000 mg | ORAL_TABLET | Freq: Three times a day (TID) | ORAL | 2 refills | Status: DC | PRN

## 2017-09-12 MED ORDER — ZOLPIDEM TARTRATE 10 MG PO TABS: 10.0000 mg | ORAL_TABLET | Freq: Every evening | ORAL | 2 refills | Status: DC | PRN

## 2017-09-12 MED ORDER — ALPRAZOLAM 1 MG PO TABS: 1.0000 mg | ORAL_TABLET | Freq: Three times a day (TID) | ORAL | 2 refills | Status: DC

## 2017-09-12 NOTE — Progress Notes (Signed)
BH MD/PA/NP OP Progress Note  04/14/3149 7:61 AM Jenna Woods  MRN:  [id number]  Chief Complaint:  Chief Complaint    Depression; Anxiety; Follow-up     HPI: This patient is a 57 year old divorced black female who lives alone in [place].  She has 2 grown sons.  She is on disability.  The patient returns after 6 weeks for follow-up on depression and anxiety.  She states that she is doing much better.  She was very stressed about several warrants but they have been dropped and she only has one court case for which she has to appear in a couple of weeks.  It was alleged that she shoplifted from [facility] and was stopped by the police but she claims is never actually happened.  Hopefully this will get dismissed as well.  She states that her health has been very good and she went to see that her primary doctor recently and got a clean bill of health.  She continues to use the Xanax as prescribed which is helped anxiety and the Ambien continues to help sleep.  She occasionally uses hydroxyzine.  Her mood is very good today and her affect is bright Visit Diagnosis:    ICD-10-CM   1. Major depressive disorder, recurrent episode, moderate (HCC) F33.1     Past Psychiatric History: none  Past Medical History:  Past Medical History:  Diagnosis Date  . Allergic reaction   . Allergy    perrenial   . Anxiety   . Arthritis   . Depression    h/o suicidal ideation in 2011  . Depression with anxiety   . Diverticulosis of colon 12/19/2015  . Diverticulosis of colon with hemorrhage 12/19/2015  . GERD (gastroesophageal reflux disease)   . Hypertension 1996  . Obesity   . Peripheral vascular disease ([place])   . Prediabetes 2013  . Scleroderma ([place]) 1998  . Systemic lupus erythematosus ([place]) 1998   Treated at [facility]  . Tobacco abuse, in remission    10-pack-years discontinued in 2007  . Urinary frequency     Past Surgical History:  Procedure Laterality Date  . ABDOMINAL HYSTERECTOMY  1990    Neoplasm  . CESAREAN SECTION     x2  . COLONOSCOPY  2005  . COLONOSCOPY WITH PROPOFOL N/A 08/28/2015   Dr. Laural Golden: scattered medium-mouth diverticula entire colon, external hemorrhoids  . COLONOSCOPY WITH PROPOFOL N/A 07/27/2016   Procedure: COLONOSCOPY WITH PROPOFOL;  Surgeon: Rogene Houston, MD;  [facility];  Service: Endoscopy;  Laterality: N/A;  . FINGER AMPUTATION     Third finger, bilateral, distal  . NASAL SINUS SURGERY  09/06/2011   Procedure: ENDOSCOPIC SINUS SURGERY;  Surgeon: Ascencion Dike, MD;  Location: [place];  Service: ENT;  Laterality: N/A;  Nasal cerebrospinal fluid leak repair with Left temporalis fascia graft and Left Ear cartilage graft  . VASCULAR SURGERY     Hands, bilaterally, [facility]; right hand partial amputation of middle finger.    Family Psychiatric History: See below  Family History:  Family History  Problem Relation Age of Onset  . Arthritis Mother        Rheumatoid  . Dementia Mother   . Hypertension Mother   . Cirrhosis Father   . Alcohol abuse Father   . Arthritis Maternal Grandmother   . Drug abuse Sister   . Graves' disease Son   . Colon cancer Neg Hx     Social History:  Social History  Socioeconomic History  . Marital status: Single    Spouse name: Not on file  . Number of children: Not on file  . Years of education: Not on file  . Highest education level: Not on file  Occupational History  . Occupation: Art therapist: [employer]    Comment: Disability awarded in [place]  . Financial resource strain: Hard  . Food insecurity:    Worry: Often true    Inability: Never true  . Transportation needs:    Medical: No    Non-medical: No  Tobacco Use  . Smoking status: Former Smoker    Packs/day: 0.25    Years: 25.00    Pack years: 6.25    Types: Cigarettes    Last attempt to quit: 04/07/2001    Years since quitting: 16.4  . Smokeless tobacco: Never Used   Substance and Sexual Activity  . Alcohol use: No    Alcohol/week: 0.0 oz    Comment: quit in 2004  . Drug use: No    Comment: use to use pot   . Sexual activity: Yes    Birth control/protection: Surgical  Lifestyle  . Physical activity:    Days per week: 3 days    Minutes per session: 50 min  . Stress: Only a little  Relationships  . Social connections:    Talks on phone: More than three times a week    Gets together: Patient refused    Attends religious service: More than 4 times per year    Active member of club or organization: Yes    Attends meetings of clubs or organizations: More than 4 times per year    Relationship status: Separated  Other Topics Concern  . Not on file  Social History Narrative  . Not on file    Allergies:  Allergies  Allergen Reactions  . Bee Venom Anaphylaxis  . Sesame Oil Anaphylaxis and Swelling    (Sesame seed)  . Aspirin     Diverticulosis with bleed  . Molds & Smuts   . Codeine Itching and Rash    Metabolic Disorder Labs: Lab Results  Component Value Date   HGBA1C 5.9 (H) 08/28/2017   MPG 123 08/28/2017   MPG 111 06/30/2016   No results found for: PROLACTIN Lab Results  Component Value Date   CHOL 126 08/28/2017   TRIG 59 08/28/2017   HDL 46 (L) 08/28/2017   CHOLHDL 2.7 08/28/2017   VLDL 15 06/30/2016   LDLCALC 66 08/28/2017   LDLCALC 56 06/30/2016   Lab Results  Component Value Date   TSH 1.10 12/13/2016   TSH 0.444 12/19/2015    Therapeutic Level Labs: No results found for: LITHIUM No results found for: VALPROATE No components found for:  CBMZ  Current Medications: Current Outpatient Medications  Medication Sig Dispense Refill  . ALPRAZolam (XANAX) 1 MG tablet Take 1 tablet (1 mg total) by mouth 3 (three) times daily. 90 tablet 2  . amLODipine (NORVASC) 10 MG tablet TAKE ONE (1) TABLET BY MOUTH EVERY DAY 90 tablet 1  . Calcium Carbonate-Vit D-Min (CALCIUM 1200) 1200-1000 MG-UNIT CHEW Chew 1 tablet by mouth  daily.    . Cholecalciferol (VITAMIN D) 2000 units CAPS Omne daily 30 capsule   . ferrous sulfate ([employer] IRON) 325 (65 FE) MG tablet Take 1 tablet (325 mg total) by mouth 2 (two) times daily with a meal. 60 tablet 3  . hydrOXYzine (ATARAX/VISTARIL) 25 MG tablet Take  1 tablet (25 mg total) by mouth 3 (three) times daily as needed. 90 tablet 2  . loratadine (CLARITIN) 10 MG tablet Take 1 tablet (10 mg total) by mouth daily. 90 tablet 3  . mirabegron ER (MYRBETRIQ) 25 MG TB24 tablet Take 1 tablet (25 mg total) by mouth daily. 30 tablet 5  . montelukast (SINGULAIR) 10 MG tablet Take 1 tablet (10 mg total) by mouth at bedtime. 90 tablet 3  . pantoprazole (PROTONIX) 20 MG tablet Take 1 tablet (20 mg total) by mouth daily. 30 tablet 5  . zolpidem (AMBIEN) 10 MG tablet Take 1 tablet (10 mg total) by mouth at bedtime as needed for sleep. 30 tablet 2   No current facility-administered medications for this visit.      Musculoskeletal: Strength & Muscle Tone: within normal limits Gait & Station: normal Patient leans: N/A  Psychiatric Specialty Exam: Review of Systems  All other systems reviewed and are negative.   Blood pressure 120/86, pulse 76, height 5\' 7"  (1.702 m), weight 237 lb (107.5 kg), SpO2 94 %.Body mass index is 37.12 kg/m.  General Appearance: Casual, Neat and Well Groomed  Eye Contact:  Good  Speech:  Clear and Coherent  Volume:  Normal  Mood:  Euthymic  Affect:  Congruent  Thought Process:  Goal Directed  Orientation:  Full (Time, Place, and Person)  Thought Content: WDL   Suicidal Thoughts:  No  Homicidal Thoughts:  No  Memory:  Immediate;   Good Recent;   Good Remote;   Good  Judgement:  Good  Insight:  Good  Psychomotor Activity:  Normal  Concentration:  Concentration: Good and Attention Span: Good  Recall:  Good  Fund of Knowledge: Good  Language: Good  Akathisia:  No  Handed:  Right  AIMS (if indicated): not done  Assets:  Communication Skills Desire for  Improvement Resilience Social Support Talents/Skills  ADL's:  Intact  Cognition: WNL  Sleep:  Good   Screenings: GAD-7     Virtual [facility] Phone Follow Up from 05/25/2017 in [place]  Total GAD-7 Score  13    PHQ2-9     Office Visit from 08/31/2017 in [place] [facility] Phone Follow Up from 05/25/2017 in [place] Office Visit from 05/23/2017 in [place] from 04/17/2017 in [place] from 04/18/2016 in [facility]  PHQ-2 Total Score  2  5  4  3  1   PHQ-9 Total Score  7  11  18  8   -       Assessment and Plan: This patient is a 57 year old female who has a history of depression and anxiety but most recently has been suffering with anxiety due to a lot of situational issues.  She is doing better now.  She will continue Xanax 1 mg 3 times daily for anxiety, hydroxyzine 25 mg up to 3 times daily as needed for anxiety as well as Ambien 10 mg at bedtime for sleep.  She will return to see me in 3 months   Levonne Spiller, MD 09/12/2017, 9:33 AM

## 2017-09-19 ENCOUNTER — Encounter (HOSPITAL_COMMUNITY): Payer: Self-pay | Admitting: Licensed Clinical Social Worker

## 2017-09-19 ENCOUNTER — Ambulatory Visit (INDEPENDENT_AMBULATORY_CARE_PROVIDER_SITE_OTHER): Payer: Medicare HMO | Admitting: Licensed Clinical Social Worker

## 2017-09-19 DIAGNOSIS — F331 Major depressive disorder, recurrent, moderate: Secondary | ICD-10-CM

## 2017-09-19 NOTE — Progress Notes (Signed)
   THERAPIST PROGRESS NOTE  Session Time: 10:00 am- 10:40 am  Participation Level: Active  Behavioral Response: CasualAlertAnxious  Type of Therapy: Individual Therapy  Treatment Goals addressed: Coping  Interventions: CBT and Solution Focused  Summary: Jenna Woods is a 57 y.o. female who presents oriented x5 (person, place, situation, time, and object), alert, casually dressed, appropriately groomed, average height, overweight and cooperative to address mood. Patient denies suicidal and homicidal ideations. Patient denies psychosis including auditory and visual hallucinations. Patient denies substance abuse. She is at low risk for lethality.   Physically: Patient is doing well physically.  Spiritually/values: Patient continues to be spiritually healthy. She prays and is faithful.  Relationships: Patient is getting along with others.  Emotional/Mental/Behavior: Patient is nervous about gong to court. She is still confused about why she is going to court. Patient is trying to stay present and not avoid going to court.   Patient engaged in session. She responded well to interventions. Patient meets criteria for Major Depressive disorder, recurrent episode, moderate. Patient will continue in outpatient therapy due to being the least restrictive service to meet her needs. Patient made minimal progress on her goals.   Suicidal/Homicidal: Negativewithout intent/plan  Therapist Response: Therapist reviewed patient's recent thoughts and behaviors. Therapist utilized CBT to address mood. Therapist processed patient's feelings to identify triggers for mood. Therapist discussed patient's experience in court and how she is going to manage being in court.    Plan: Return again in 3 weeks.  Diagnosis: Axis I: Major depressive disorder, recurrent episode, moderate    Axis II: No diagnosis    Glori Bickers, LCSW 09/19/2017

## 2017-10-25 ENCOUNTER — Telehealth: Payer: Self-pay | Admitting: Family Medicine

## 2017-10-31 NOTE — Telephone Encounter (Signed)
error 

## 2017-11-13 ENCOUNTER — Encounter (HOSPITAL_COMMUNITY): Payer: Self-pay | Admitting: Licensed Clinical Social Worker

## 2017-11-13 ENCOUNTER — Ambulatory Visit (INDEPENDENT_AMBULATORY_CARE_PROVIDER_SITE_OTHER): Payer: Medicare HMO | Admitting: Licensed Clinical Social Worker

## 2017-11-13 DIAGNOSIS — F331 Major depressive disorder, recurrent, moderate: Secondary | ICD-10-CM

## 2017-11-13 NOTE — Progress Notes (Signed)
   THERAPIST PROGRESS NOTE  Session Time: 3:00 pm- 3:40 pm  Participation Level: Active  Behavioral Response: CasualAlertAnxious  Type of Therapy: Individual Therapy  Treatment Goals addressed: Coping  Interventions: CBT and Solution Focused  Summary: Jenna Woods is a 57 y.o. female who presents oriented x5 (person, place, situation, time, and object), alert, casually dressed, appropriately groomed, average height, overweight and cooperative to address mood. Patient denies suicidal and homicidal ideations. Patient denies psychosis including auditory and visual hallucinations. Patient denies substance abuse. She is at low risk for lethality.   Physically: Patient is doing well physically.  Spiritually/values: Patient continues to attend church and pray.  Relationships: Patient is getting along with others.  Emotional/Mental/Behavior: Patient was stressed. She got through her last court date and the charge was thrown out. She received a letter in the mail saying that she needed to pay $1000 for her bond because she missed a court day. Patient was confused but has attempted to call to get clarification. She is not going to let it overwhelm her and she said she would pay $4 a month if she had to. Patient also had a young lady that she helped raise pass away last week. She was upset about that. Patient did not that despite everything going on she is managing her mood and stress. Patient is planning on moving with her son so she is not by herself.   Patient engaged in session. She responded well to interventions. Patient meets criteria for Major Depressive disorder, recurrent episode, moderate. Patient will continue in outpatient therapy due to being the least restrictive service to meet her needs. Patient made moderate progress on her goals.   Suicidal/Homicidal: Negativewithout intent/plan  Therapist Response: Therapist reviewed patient's recent thoughts and behaviors. Therapist utilized  CBT to address mood. Therapist processed patient's feelings to identify triggers for mood. Therapist discussed anxiety and how she is managing it.   Plan: Return again in 3 weeks.  Diagnosis: Axis I: Major depressive disorder, recurrent episode, moderate    Axis II: No diagnosis    Glori Bickers, LCSW 11/13/2017

## 2017-12-04 ENCOUNTER — Ambulatory Visit (HOSPITAL_COMMUNITY): Payer: Self-pay | Admitting: Licensed Clinical Social Worker

## 2017-12-13 ENCOUNTER — Ambulatory Visit (HOSPITAL_COMMUNITY): Payer: Self-pay | Admitting: Psychiatry

## 2017-12-13 ENCOUNTER — Telehealth: Payer: Self-pay | Admitting: Family Medicine

## 2017-12-13 NOTE — Telephone Encounter (Signed)
Called patient and left 2 voicemails before & after lunch to r/s todays appt

## 2017-12-18 ENCOUNTER — Ambulatory Visit (HOSPITAL_COMMUNITY): Payer: Self-pay | Admitting: Licensed Clinical Social Worker

## 2017-12-21 ENCOUNTER — Other Ambulatory Visit: Payer: Self-pay | Admitting: Family Medicine

## 2017-12-27 ENCOUNTER — Telehealth: Payer: Self-pay

## 2017-12-27 DIAGNOSIS — M359 Systemic involvement of connective tissue, unspecified: Secondary | ICD-10-CM

## 2017-12-27 DIAGNOSIS — I10 Essential (primary) hypertension: Secondary | ICD-10-CM

## 2017-12-27 DIAGNOSIS — R7302 Impaired glucose tolerance (oral): Secondary | ICD-10-CM

## 2017-12-27 DIAGNOSIS — E559 Vitamin D deficiency, unspecified: Secondary | ICD-10-CM

## 2017-12-27 NOTE — Telephone Encounter (Signed)
Labs ordered.

## 2018-01-01 ENCOUNTER — Ambulatory Visit (HOSPITAL_COMMUNITY): Payer: Self-pay | Admitting: Licensed Clinical Social Worker

## 2018-01-25 ENCOUNTER — Other Ambulatory Visit (HOSPITAL_COMMUNITY): Payer: Self-pay | Admitting: Psychiatry

## 2018-01-29 ENCOUNTER — Ambulatory Visit (INDEPENDENT_AMBULATORY_CARE_PROVIDER_SITE_OTHER): Payer: Medicare HMO | Admitting: Family Medicine

## 2018-01-29 ENCOUNTER — Ambulatory Visit (INDEPENDENT_AMBULATORY_CARE_PROVIDER_SITE_OTHER): Payer: Medicare HMO | Admitting: Psychiatry

## 2018-01-29 ENCOUNTER — Ambulatory Visit: Payer: Self-pay | Admitting: Family Medicine

## 2018-01-29 ENCOUNTER — Encounter: Payer: Self-pay | Admitting: Family Medicine

## 2018-01-29 ENCOUNTER — Encounter (HOSPITAL_COMMUNITY): Payer: Self-pay | Admitting: Psychiatry

## 2018-01-29 VITALS — BP 122/84 | HR 104 | Resp 15 | Ht 67.0 in | Wt 243.0 lb

## 2018-01-29 VITALS — BP 118/78 | HR 88 | Ht 67.0 in | Wt 242.0 lb

## 2018-01-29 DIAGNOSIS — I10 Essential (primary) hypertension: Secondary | ICD-10-CM | POA: Diagnosis not present

## 2018-01-29 DIAGNOSIS — R7302 Impaired glucose tolerance (oral): Secondary | ICD-10-CM | POA: Diagnosis not present

## 2018-01-29 DIAGNOSIS — N39498 Other specified urinary incontinence: Secondary | ICD-10-CM | POA: Diagnosis not present

## 2018-01-29 DIAGNOSIS — E559 Vitamin D deficiency, unspecified: Secondary | ICD-10-CM | POA: Diagnosis not present

## 2018-01-29 DIAGNOSIS — F331 Major depressive disorder, recurrent, moderate: Secondary | ICD-10-CM

## 2018-01-29 DIAGNOSIS — K219 Gastro-esophageal reflux disease without esophagitis: Secondary | ICD-10-CM

## 2018-01-29 DIAGNOSIS — M349 Systemic sclerosis, unspecified: Secondary | ICD-10-CM | POA: Diagnosis not present

## 2018-01-29 DIAGNOSIS — M359 Systemic involvement of connective tissue, unspecified: Secondary | ICD-10-CM | POA: Diagnosis not present

## 2018-01-29 MED ORDER — ALPRAZOLAM 1 MG PO TABS: 1.0000 mg | ORAL_TABLET | Freq: Three times a day (TID) | ORAL | 2 refills | Status: DC

## 2018-01-29 MED ORDER — HYDROXYZINE HCL 25 MG PO TABS: 25.0000 mg | ORAL_TABLET | Freq: Three times a day (TID) | ORAL | 2 refills | Status: DC | PRN

## 2018-01-29 MED ORDER — ZOLPIDEM TARTRATE 10 MG PO TABS: 10.0000 mg | ORAL_TABLET | Freq: Every evening | ORAL | 2 refills | Status: DC | PRN

## 2018-01-29 NOTE — Assessment & Plan Note (Signed)
Controlled, no change in medication  

## 2018-01-29 NOTE — Assessment & Plan Note (Signed)
states she intends to be evaluated in 2020, when more settled, had an appt which she did not keep/ refused to schedule appointment after stating earlier this year that she would

## 2018-01-29 NOTE — Progress Notes (Signed)
BH MD/PA/NP OP Progress Note  08/65/7846 9:62 PM Jenna Woods  MRN:  [id number]  Chief Complaint:  Chief Complaint    Depression; Anxiety; Follow-up     HPI: This patient is a 57 year old divorced black female who lives alone in [place].  Currently however she is staying with her son in [place] for the time being.  She has 2 grown sons.  She is on disability  The patient returns after 4 months.  She states that her son was going through a lot because of lump was found on his prostate and he was worried about having cancer.  It turned out to be benign but he was also found to have thyroid enlargement.  This is being treated now.  He is also trying to gain visitation of his son and this is been very difficult as well.  He was very stressed and she decided to go and stay with him temporarily.  She states that for the most part she is doing well but still takes anxiety medication and the Ambien to help her sleep.  Her anxiety is under good control and she denies being depressed Visit Diagnosis:    ICD-10-CM   1. Major depressive disorder, recurrent episode, moderate (HCC) F33.1     Past Psychiatric History: None  Past Medical History:  Past Medical History:  Diagnosis Date  . Allergic reaction   . Allergy    perrenial   . Anxiety   . Arthritis   . Depression    h/o suicidal ideation in 2011  . Depression with anxiety   . Diverticulosis of colon 12/19/2015  . Diverticulosis of colon with hemorrhage 12/19/2015  . GERD (gastroesophageal reflux disease)   . Hypertension 1996  . Obesity   . Peripheral vascular disease ([place])   . Prediabetes 2013  . Scleroderma ([place]) 1998  . Systemic lupus erythematosus ([place]) 1998   Treated at [facility]  . Tobacco abuse, in remission    10-pack-years discontinued in 2007  . Urinary frequency     Past Surgical History:  Procedure Laterality Date  . ABDOMINAL HYSTERECTOMY  1990   Neoplasm  . CESAREAN SECTION     x2  . COLONOSCOPY  2005  .  COLONOSCOPY WITH PROPOFOL N/A 08/28/2015   Dr. Laural Golden: scattered medium-mouth diverticula entire colon, external hemorrhoids  . COLONOSCOPY WITH PROPOFOL N/A 07/27/2016   Procedure: COLONOSCOPY WITH PROPOFOL;  Surgeon: Rogene Houston, MD;  [facility];  Service: Endoscopy;  Laterality: N/A;  . FINGER AMPUTATION     Third finger, bilateral, distal  . NASAL SINUS SURGERY  09/06/2011   Procedure: ENDOSCOPIC SINUS SURGERY;  Surgeon: Ascencion Dike, MD;  Location: [place];  Service: ENT;  Laterality: N/A;  Nasal cerebrospinal fluid leak repair with Left temporalis fascia graft and Left Ear cartilage graft  . VASCULAR SURGERY     Hands, bilaterally, [facility]; right hand partial amputation of middle finger.    Family Psychiatric History: See below  Family History:  Family History  Problem Relation Age of Onset  . Arthritis Mother        Rheumatoid  . Dementia Mother   . Hypertension Mother   . Cirrhosis Father   . Alcohol abuse Father   . Arthritis Maternal Grandmother   . Drug abuse Sister   . Graves' disease Son   . Colon cancer Neg Hx     Social History:  Social History   Socioeconomic History  . Marital status:  Single    Spouse name: Not on file  . Number of children: Not on file  . Years of education: Not on file  . Highest education level: Not on file  Occupational History  . Occupation: Art therapist: [employer]    Comment: Disability awarded in [place]  . Financial resource strain: Hard  . Food insecurity:    Worry: Often true    Inability: Never true  . Transportation needs:    Medical: No    Non-medical: No  Tobacco Use  . Smoking status: Former Smoker    Packs/day: 0.25    Years: 25.00    Pack years: 6.25    Types: Cigarettes    Last attempt to quit: 04/07/2001    Years since quitting: 16.8  . Smokeless tobacco: Never Used  Substance and Sexual Activity  . Alcohol use: No     Alcohol/week: 0.0 standard drinks    Comment: quit in 2004  . Drug use: No    Comment: use to use pot   . Sexual activity: Yes    Birth control/protection: Surgical  Lifestyle  . Physical activity:    Days per week: 3 days    Minutes per session: 50 min  . Stress: Only a little  Relationships  . Social connections:    Talks on phone: More than three times a week    Gets together: Patient refused    Attends religious service: More than 4 times per year    Active member of club or organization: Yes    Attends meetings of clubs or organizations: More than 4 times per year    Relationship status: Separated  Other Topics Concern  . Not on file  Social History Narrative  . Not on file    Allergies:  Allergies  Allergen Reactions  . Bee Venom Anaphylaxis  . Sesame Oil Anaphylaxis and Swelling    (Sesame seed)  . Aspirin     Diverticulosis with bleed  . Molds & Smuts   . Codeine Itching and Rash    Metabolic Disorder Labs: Lab Results  Component Value Date   HGBA1C 5.9 (H) 08/28/2017   MPG 123 08/28/2017   MPG 111 06/30/2016   No results found for: PROLACTIN Lab Results  Component Value Date   CHOL 126 08/28/2017   TRIG 59 08/28/2017   HDL 46 (L) 08/28/2017   CHOLHDL 2.7 08/28/2017   VLDL 15 06/30/2016   LDLCALC 66 08/28/2017   LDLCALC 56 06/30/2016   Lab Results  Component Value Date   TSH 1.10 12/13/2016   TSH 0.444 12/19/2015    Therapeutic Level Labs: No results found for: LITHIUM No results found for: VALPROATE No components found for:  CBMZ  Current Medications: Current Outpatient Medications  Medication Sig Dispense Refill  . ALPRAZolam (XANAX) 1 MG tablet Take 1 tablet (1 mg total) by mouth 3 (three) times daily. 90 tablet 2  . amLODipine (NORVASC) 10 MG tablet TAKE ONE (1) TABLET BY MOUTH EVERY DAY 90 tablet 1  . Calcium Carbonate-Vit D-Min (CALCIUM 1200) 1200-1000 MG-UNIT CHEW Chew 1 tablet by mouth daily.    . Cholecalciferol (VITAMIN D) 2000  units CAPS Omne daily 30 capsule   . ferrous sulfate ([employer] IRON) 325 (65 FE) MG tablet Take 1 tablet (325 mg total) by mouth 2 (two) times daily with a meal. 60 tablet 3  . hydrOXYzine (ATARAX/VISTARIL) 25 MG tablet Take 1 tablet (25 mg total)  by mouth 3 (three) times daily as needed. 90 tablet 2  . loratadine (CLARITIN) 10 MG tablet Take 1 tablet (10 mg total) by mouth daily. 90 tablet 3  . montelukast (SINGULAIR) 10 MG tablet Take 1 tablet (10 mg total) by mouth at bedtime. 90 tablet 3  . MYRBETRIQ 25 MG TB24 tablet TAKE ONE TABLET (25MG  TOTAL) BY MOUTH DAILY 30 tablet 5  . pantoprazole (PROTONIX) 20 MG tablet Take 1 tablet (20 mg total) by mouth daily. 30 tablet 5  . zolpidem (AMBIEN) 10 MG tablet Take 1 tablet (10 mg total) by mouth at bedtime as needed for sleep. 30 tablet 2   No current facility-administered medications for this visit.      Musculoskeletal: Strength & Muscle Tone: within normal limits Gait & Station: normal Patient leans: N/A  Psychiatric Specialty Exam: Review of Systems  Psychiatric/Behavioral: The patient is nervous/anxious.   All other systems reviewed and are negative.   Blood pressure 118/78, pulse 88, height 5\' 7"  (1.702 m), weight 242 lb (109.8 kg), SpO2 97 %.Body mass index is 37.9 kg/m.  General Appearance: Casual and Fairly Groomed  Eye Contact:  Good  Speech:  Clear and Coherent  Volume:  Normal  Mood:  Anxious  Affect:  Appropriate and Congruent  Thought Process:  Goal Directed  Orientation:  Full (Time, Place, and Person)  Thought Content: Rumination   Suicidal Thoughts:  No  Homicidal Thoughts:  No  Memory:  Immediate;   Good Recent;   Good Remote;   Fair  Judgement:  Fair  Insight:  Fair  Psychomotor Activity:  Normal  Concentration:  Concentration: Good and Attention Span: Good  Recall:  Good  Fund of Knowledge: Fair  Language: Good  Akathisia:  No  Handed:  Right  AIMS (if indicated): not done  Assets:  Communication  Skills Desire for Improvement Resilience Social Support Talents/Skills  ADL's:  Intact  Cognition: WNL  Sleep:  Good   Screenings: GAD-7     Virtual BH Phone Follow Up from 05/25/2017 in [place]  Total GAD-7 Score  13    PHQ2-9     Office Visit from 01/29/2018 in [facility] Office Visit from 08/31/2017 in [facility] Virtual [facility] Phone Follow Up from 05/25/2017 in [place] Office Visit from 05/23/2017 in [place] from 04/17/2017 in [facility]  PHQ-2 Total Score  0  2  5  4  3   PHQ-9 Total Score  -  7  11  18  8        Assessment and Plan: This patient is a 57 year old female with a history of anxiety and insomnia.  She seems to be doing much better with the current regimen.  She will continue Xanax 1 mg 3 times daily for anxiety, hydroxyzine 25 mg 3 times daily as needed for anxiety and Ambien 10 mg at bedtime as needed for sleep.  She will return to see me in 3 months   Levonne Spiller, MD 01/29/2018, 2:39 PM

## 2018-01-29 NOTE — Assessment & Plan Note (Signed)
Deteriorated. Patient re-educated about  the importance of commitment to a  minimum of 150 minutes of exercise per week.  The importance of healthy food choices with portion control discussed. Encouraged to start a food diary, count calories and to consider  joining a support group. Sample diet sheets offered. Goals set by the patient for the next several months.   Weight /BMI 01/29/2018 08/31/2017 05/23/2017  WEIGHT 243 lb 240 lb 235 lb  HEIGHT 5\' 7"  5\' 7"  5\' 7"   BMI 38.06 kg/m2 37.59 kg/m2 36.81 kg/m2  Some encounter information is confidential and restricted. Go to Review Flowsheets activity to see all data.

## 2018-01-29 NOTE — Assessment & Plan Note (Signed)
Controlled, no change in medication DASH diet and commitment to daily physical activity for a minimum of 30 minutes discussed and encouraged, as a part of hypertension management. The importance of attaining a healthy weight is also discussed.  BP/Weight 01/29/2018 08/31/2017 05/23/2017 04/17/2017 03/30/2017 12/20/2016 11/02/8335  Systolic BP 445 146 047 998 721 587 276  Diastolic BP 84 80 84 74 90 78 80  Wt. (Lbs) 243 240 235 235 230.9 233.5 224  BMI 38.06 37.59 36.81 35.73 35.11 36.57 35.08  Some encounter information is confidential and restricted. Go to Review Flowsheets activity to see all data.

## 2018-01-29 NOTE — Progress Notes (Signed)
Jenna Woods     MRN: [id number]      DOB: 05-24-1960   HPI Ms. Leys is here for follow up and re-evaluation of chronic medical conditions, medication management and review of any available recent lab and radiology data.  Preventive health is updated, specifically  Cancer screening and Immunization.   Questions or concerns regarding consultations or procedures which the PT has had in the interim are  addressed. The PT denies any adverse reactions to current medications since the last visit.  Temporarily moved to [facility] to be with her son who is currently having health challenges, he is improving, states she plans to return but unsure when Seeing Psychiatry regularly which has been very helpful ROS Denies recent fever or chills. Denies sinus pressure, nasal congestion, ear pain or sore throat. Denies chest congestion, productive cough or wheezing. Denies chest pains, palpitations and leg swelling Denies abdominal pain, nausea, vomiting,diarrhea or constipation.   Denies dysuria, frequency, hesitancy or incontinence. Denies joint pain, swelling and limitation in mobility. Denies headaches, seizures, numbness, or tingling. Denies uncontrolled depression, anxiety or insomnia. Denies skin break down or rash.   PE  BP 122/84   Pulse (!) 104   Resp 15   Ht 5\' 7"  (1.702 m)   Wt 243 lb (110.2 kg)   SpO2 95%   BMI 38.06 kg/m   Patient alert and oriented and in no cardiopulmonary distress.  HEENT: No facial asymmetry, EOMI,   oropharynx pink and moist.  Neck supple no JVD, no mass.  Chest: Clear to auscultation bilaterally.  CVS: S1, S2 no murmurs, no S3.Regular rate.  ABD: Soft non tender.   Ext: No edema  MS: Adequate ROM spine, shoulders, hips and knees.  Skin: Intact, no ulcerations or rash noted.  Psych: Good eye contact, normal affect. Memory intact not anxious or depressed appearing.  CNS: CN 2-12 intact, power,  normal throughout.no focal deficits  noted.   Assessment & Plan  Hypertension Controlled, no change in medication DASH diet and commitment to daily physical activity for a minimum of 30 minutes discussed and encouraged, as a part of hypertension management. The importance of attaining a healthy weight is also discussed.  BP/Weight 01/29/2018 08/31/2017 05/23/2017 04/17/2017 03/30/2017 12/20/2016 4/00/8676  Systolic BP 195 093 267 124 580 998 338  Diastolic BP 84 80 84 74 90 78 80  Wt. (Lbs) 243 240 235 235 230.9 233.5 224  BMI 38.06 37.59 36.81 35.73 35.11 36.57 35.08  Some encounter information is confidential and restricted. Go to Review Flowsheets activity to see all data.       Morbid obesity ([place]) Deteriorated. Patient re-educated about  the importance of commitment to a  minimum of 150 minutes of exercise per week.  The importance of healthy food choices with portion control discussed. Encouraged to start a food diary, count calories and to consider  joining a support group. Sample diet sheets offered. Goals set by the patient for the next several months.   Weight /BMI 01/29/2018 08/31/2017 05/23/2017  WEIGHT 243 lb 240 lb 235 lb  HEIGHT 5\' 7"  5\' 7"  5\' 7"   BMI 38.06 kg/m2 37.59 kg/m2 36.81 kg/m2  Some encounter information is confidential and restricted. Go to Review Flowsheets activity to see all data.      GERD (gastroesophageal reflux disease) Controlled, no change in medication   Scleroderma ([place]) states she intends to be evaluated in 2020, when more settled, had an appt which she did not keep/ refused to schedule  appointment after stating earlier this year that she would  Urinary incontinence Controlled, no change in medication

## 2018-01-29 NOTE — Patient Instructions (Addendum)
Wellness with nurse next date of appt with Dr Harrington Challenger, Feb/ March 2-020)   Annual exam with MD   In early May, call if you need me before  BP is excellent   I will have nurse leave message on your cell re labs  It is important that you exercise regularly at least 30 minutes 5 times a week. If you develop chest pain, have severe difficulty breathing, or feel very tired, stop exercising immediately and seek medical attention  All the best for improved  Health all around, be safe on the roads

## 2018-01-30 LAB — TSH: TSH: 0.64 mIU/L (ref 0.40–4.50)

## 2018-01-30 LAB — BASIC METABOLIC PANEL WITH GFR
BUN: 11 mg/dL (ref 7–25)
CO2: 25 mmol/L (ref 20–32)
Calcium: 9.3 mg/dL (ref 8.6–10.4)
Chloride: 109 mmol/L (ref 98–110)
Creat: 0.7 mg/dL (ref 0.50–1.05)
GFR, EST AFRICAN AMERICAN: 110 mL/min/{1.73_m2} (ref >=60)
GFR, Est Non African American: 95 mL/min/{1.73_m2} (ref >=60)
Glucose, Bld: 107 mg/dL — ABNORMAL HIGH (ref 65–99)
Potassium: 4 mmol/L (ref 3.5–5.3)
SODIUM: 142 mmol/L (ref 135–146)

## 2018-01-30 LAB — HEMOGLOBIN A1C
Hgb A1c MFr Bld: 6.1 % of total Hgb — ABNORMAL HIGH (ref <5.7)
Mean Plasma Glucose: 128 (calc)
eAG (mmol/L): 7.1 (calc)

## 2018-01-30 LAB — VITAMIN D 25 HYDROXY (VIT D DEFICIENCY, FRACTURES): Vit D, 25-Hydroxy: 35 ng/mL (ref 30–100)

## 2018-01-30 LAB — CBC
HEMATOCRIT: 39.6 % (ref 35.0–45.0)
HEMOGLOBIN: 13.2 g/dL (ref 11.7–15.5)
MCH: 30.3 pg (ref 27.0–33.0)
MCHC: 33.3 g/dL (ref 32.0–36.0)
MCV: 90.8 fL (ref 80.0–100.0)
MPV: 10 fL (ref 7.5–12.5)
Platelets: 337 10*3/uL (ref 140–400)
RBC: 4.36 10*6/uL (ref 3.80–5.10)
RDW: 12 % (ref 11.0–15.0)
WBC: 7.9 10*3/uL (ref 3.8–10.8)

## 2018-02-27 ENCOUNTER — Other Ambulatory Visit: Payer: Self-pay | Admitting: Family Medicine

## 2018-03-05 ENCOUNTER — Other Ambulatory Visit: Payer: Self-pay

## 2018-03-05 ENCOUNTER — Telehealth: Payer: Self-pay | Admitting: *Deleted

## 2018-03-05 DIAGNOSIS — J209 Acute bronchitis, unspecified: Secondary | ICD-10-CM

## 2018-03-05 MED ORDER — PANTOPRAZOLE SODIUM 20 MG PO TBEC: 20.0000 mg | DELAYED_RELEASE_TABLET | Freq: Every day | ORAL | 1 refills | Status: DC

## 2018-03-05 MED ORDER — PROMETHAZINE-DM 6.25-15 MG/5ML PO SYRP: ORAL_SOLUTION | ORAL | 0 refills | Status: DC

## 2018-03-05 NOTE — Telephone Encounter (Signed)
Can patient have this medication refilled?

## 2018-03-05 NOTE — Telephone Encounter (Signed)
Pt called back wanting acid reflux medicine pantoprazole called into [place] [facility]. She has two left and she needs the 3 month supply.

## 2018-03-05 NOTE — Telephone Encounter (Signed)
Medication refilled and sent into [facility] in [place]

## 2018-03-05 NOTE — Telephone Encounter (Signed)
Promethazine refilled and sent to [facility] in [place]

## 2018-03-05 NOTE — Telephone Encounter (Signed)
Yes pls refill x 1 only

## 2018-03-05 NOTE — Telephone Encounter (Signed)
Pt wanted a refill on promethazine dm 15mg /44mL for cough. It can be sent to the [facility] in [place]. No other symptoms besides cough.

## 2018-03-13 DIAGNOSIS — Z1231 Encounter for screening mammogram for malignant neoplasm of breast: Secondary | ICD-10-CM | POA: Diagnosis not present

## 2018-03-16 ENCOUNTER — Telehealth (HOSPITAL_COMMUNITY): Payer: Self-pay

## 2018-03-16 NOTE — Telephone Encounter (Addendum)
Patient request a refill on Ambien 10 mg. Please send to [facility]  This message was sent to Dr. Moshe Cipro in error

## 2018-03-19 ENCOUNTER — Other Ambulatory Visit (HOSPITAL_COMMUNITY): Payer: Self-pay | Admitting: Psychiatry

## 2018-03-19 MED ORDER — ZOLPIDEM TARTRATE 10 MG PO TABS
10.0000 mg | ORAL_TABLET | Freq: Every evening | ORAL | 2 refills | Status: DC | PRN
Start: 2018-03-19 — End: 2018-04-24

## 2018-03-19 NOTE — Telephone Encounter (Signed)
sent 

## 2018-04-23 ENCOUNTER — Ambulatory Visit (INDEPENDENT_AMBULATORY_CARE_PROVIDER_SITE_OTHER): Payer: Medicare Other

## 2018-04-23 VITALS — BP 108/82 | HR 71 | Resp 12 | Ht 67.0 in | Wt 240.0 lb

## 2018-04-23 DIAGNOSIS — Z0000 Encounter for general adult medical examination without abnormal findings: Secondary | ICD-10-CM

## 2018-04-23 NOTE — Patient Instructions (Signed)
[name] , Thank you for taking time to come for your Medicare Wellness Visit. I appreciate your ongoing commitment to your health goals. Please review the following plan we discussed and let me know if I can assist you in the future.   Screening recommendations/referrals: Colonoscopy: up to date  Mammogram: up to date  Bone Density: N/A  Recommended yearly ophthalmology/optometry visit for glaucoma screening and checkup Recommended yearly dental visit for hygiene and checkup  Vaccinations: Influenza vaccine: up to date  Pneumococcal vaccine: N/A  Tdap vaccine: up to date  Shingles vaccine: N/A     Advanced directives: information provided   Conditions/risks identified: anxiety, depression, hypertension   Next appointment: wellness visit in one year   Preventive Care 40-64 Years, Female Preventive care refers to lifestyle choices and visits with your health care provider that can promote health and wellness. What does preventive care include?  A yearly physical exam. This is also called an annual well check.  Dental exams once or twice a year.  Routine eye exams. Ask your health care provider how often you should have your eyes checked.  Personal lifestyle choices, including:  Daily care of your teeth and gums.  Regular physical activity.  Eating a healthy diet.  Avoiding tobacco and drug use.  Limiting alcohol use.  Practicing safe sex.  Taking low-dose aspirin daily starting at age 87.  Taking vitamin and mineral supplements as recommended by your health care provider. What happens during an annual well check? The services and screenings done by your health care provider during your annual well check will depend on your age, overall health, lifestyle risk factors, and family history of disease. Counseling  Your health care provider may ask you questions about your:  Alcohol use.  Tobacco use.  Drug use.  Emotional well-being.  Home and relationship  well-being.  Sexual activity.  Eating habits.  Work and work Statistician.  Method of birth control.  Menstrual cycle.  Pregnancy history. Screening  You may have the following tests or measurements:  Height, weight, and BMI.  Blood pressure.  Lipid and cholesterol levels. These may be checked every 5 years, or more frequently if you are over 71 years old.  Skin check.  Lung cancer screening. You may have this screening every year starting at age 23 if you have a 30-pack-year history of smoking and currently smoke or have quit within the past 15 years.  Fecal occult blood test (FOBT) of the stool. You may have this test every year starting at age 66.  Flexible sigmoidoscopy or colonoscopy. You may have a sigmoidoscopy every 5 years or a colonoscopy every 10 years starting at age 86.  Hepatitis C blood test.  Hepatitis B blood test.  Sexually transmitted disease (STD) testing.  Diabetes screening. This is done by checking your blood sugar (glucose) after you have not eaten for a while (fasting). You may have this done every 1-3 years.  Mammogram. This may be done every 1-2 years. Talk to your health care provider about when you should start having regular mammograms. This may depend on whether you have a family history of breast cancer.  BRCA-related cancer screening. This may be done if you have a family history of breast, ovarian, tubal, or peritoneal cancers.  Pelvic exam and Pap test. This may be done every 3 years starting at age 27. Starting at age 76, this may be done every 5 years if you have a Pap test in combination with an HPV  test.  Bone density scan. This is done to screen for osteoporosis. You may have this scan if you are at high risk for osteoporosis. Discuss your test results, treatment options, and if necessary, the need for more tests with your health care provider. Vaccines  Your health care provider may recommend certain vaccines, such  as:  Influenza vaccine. This is recommended every year.  Tetanus, diphtheria, and acellular pertussis (Tdap, Td) vaccine. You may need a Td booster every 10 years.  Zoster vaccine. You may need this after age 49.  Pneumococcal 13-valent conjugate (PCV13) vaccine. You may need this if you have certain conditions and were not previously vaccinated.  Pneumococcal polysaccharide (PPSV23) vaccine. You may need one or two doses if you smoke cigarettes or if you have certain conditions. Talk to your health care provider about which screenings and vaccines you need and how often you need them. This information is not intended to replace advice given to you by your health care provider. Make sure you discuss any questions you have with your health care provider. Document Released: 03/20/2015 Document Revised: 11/11/2015 Document Reviewed: 12/23/2014 Elsevier Interactive Patient Education  2017 [place] Prevention in the Home Falls can cause injuries. They can happen to people of all ages. There are many things you can do to make your home safe and to help prevent falls. What can I do on the outside of my home?  Regularly fix the edges of walkways and driveways and fix any cracks.  Remove anything that might make you trip as you walk through a door, such as a raised step or threshold.  Trim any bushes or trees on the path to your home.  Use bright outdoor lighting.  Clear any walking paths of anything that might make someone trip, such as rocks or tools.  Regularly check to see if handrails are loose or broken. Make sure that both sides of any steps have handrails.  Any raised decks and porches should have guardrails on the edges.  Have any leaves, snow, or ice cleared regularly.  Use sand or salt on walking paths during winter.  Clean up any spills in your garage right away. This includes oil or grease spills. What can I do in the bathroom?  Use night  lights.  Install grab bars by the toilet and in the tub and shower. Do not use towel bars as grab bars.  Use non-skid mats or decals in the tub or shower.  If you need to sit down in the shower, use a plastic, non-slip stool.  Keep the floor dry. Clean up any water that spills on the floor as soon as it happens.  Remove soap buildup in the tub or shower regularly.  Attach bath mats securely with double-sided non-slip rug tape.  Do not have throw rugs and other things on the floor that can make you trip. What can I do in the bedroom?  Use night lights.  Make sure that you have a light by your bed that is easy to reach.  Do not use any sheets or blankets that are too big for your bed. They should not hang down onto the floor.  Have a firm chair that has side arms. You can use this for support while you get dressed.  Do not have throw rugs and other things on the floor that can make you trip. What can I do in the kitchen?  Clean up any spills right away.  Avoid  walking on wet floors.  Keep items that you use a lot in easy-to-reach places.  If you need to reach something above you, use a strong step stool that has a grab bar.  Keep electrical cords out of the way.  Do not use floor polish or wax that makes floors slippery. If you must use wax, use non-skid floor wax.  Do not have throw rugs and other things on the floor that can make you trip. What can I do with my stairs?  Do not leave any items on the stairs.  Make sure that there are handrails on both sides of the stairs and use them. Fix handrails that are broken or loose. Make sure that handrails are as long as the stairways.  Check any carpeting to make sure that it is firmly attached to the stairs. Fix any carpet that is loose or worn.  Avoid having throw rugs at the top or bottom of the stairs. If you do have throw rugs, attach them to the floor with carpet tape.  Make sure that you have a light switch at the  top of the stairs and the bottom of the stairs. If you do not have them, ask someone to add them for you. What else can I do to help prevent falls?  Wear shoes that:  Do not have high heels.  Have rubber bottoms.  Are comfortable and fit you well.  Are closed at the toe. Do not wear sandals.  If you use a stepladder:  Make sure that it is fully opened. Do not climb a closed stepladder.  Make sure that both sides of the stepladder are locked into place.  Ask someone to hold it for you, if possible.  Clearly mark and make sure that you can see:  Any grab bars or handrails.  First and last steps.  Where the edge of each step is.  Use tools that help you move around (mobility aids) if they are needed. These include:  Canes.  Walkers.  Scooters.  Crutches.  Turn on the lights when you go into a dark area. Replace any light bulbs as soon as they burn out.  Set up your furniture so you have a clear path. Avoid moving your furniture around.  If any of your floors are uneven, fix them.  If there are any pets around you, be aware of where they are.  Review your medicines with your doctor. Some medicines can make you feel dizzy. This can increase your chance of falling. Ask your doctor what other things that you can do to help prevent falls. This information is not intended to replace advice given to you by your health care provider. Make sure you discuss any questions you have with your health care provider. Document Released: 12/18/2008 Document Revised: 07/30/2015 Document Reviewed: 03/28/2014 Elsevier Interactive Patient Education  2017 [employer].

## 2018-04-23 NOTE — Progress Notes (Signed)
Subjective:   Jenna Woods is a 58 y.o. female who presents for Medicare Annual (Subsequent) preventive examination.  Review of Systems:   Cardiac Risk Factors include: obesity (BMI >30kg/m2);sedentary lifestyle;smoking/ tobacco exposure;hypertension;diabetes mellitus     Objective:     Vitals: BP 108/82   Pulse 71   Resp 12   Ht 5\' 7"  (1.702 m)   Wt 240 lb (108.9 kg)   SpO2 98%   BMI 37.59 kg/m   Body mass index is 37.59 kg/m.  Advanced Directives 04/23/2018 04/17/2017 03/30/2017 07/27/2016 07/25/2016 07/25/2016 07/02/2016  Does Patient Have a Medical Advance Directive? No Yes No Yes Yes Yes Yes  Type of Advance Directive - - - - Living will Living will Living will  Does patient want to make changes to medical advance directive? - No - Patient declined - - No - Patient declined - No - Patient declined  Copy of [place]  Would patient like information on creating a medical advance directive? Yes (MAU/Ambulatory/Procedural Areas - Information given) - No - Patient declined - - - No - Patient declined  Pre-existing out of facility DNR order (yellow form or pink MOST form) - - - - - - -    Tobacco Social History   Tobacco Use  Smoking Status Current Every Day Smoker  . Packs/day: 0.25  . Years: 25.00  . Pack years: 6.25  . Types: Cigarettes  . Last attempt to quit: 04/07/2001  . Years since quitting: 17.0  Smokeless Tobacco Never Used     Ready to quit: No Counseling given: No   Clinical Intake:  Pre-visit preparation completed: Yes  Pain : No/denies pain Pain Score: 0-No pain     BMI - recorded: 37.59 Nutritional Status: BMI > 30  Obese Nutritional Risks: None Diabetes: No  How often do you need to have someone help you when you read instructions, pamphlets, or other written materials from your doctor or pharmacy?: 2 - Rarely What is the last grade level you completed in school?: 12 grade   Interpreter Needed?:  No  Information entered by :: Francena Hanly LPN   Past Medical History:  Diagnosis Date  . Allergic reaction   . Allergy    perrenial   . Anxiety   . Arthritis   . Depression    h/o suicidal ideation in 2011  . Depression with anxiety   . Diverticulosis of colon 12/19/2015  . Diverticulosis of colon with hemorrhage 12/19/2015  . GERD (gastroesophageal reflux disease)   . Hypertension 1996  . Obesity   . Peripheral vascular disease ([place])   . Prediabetes 2013  . Scleroderma ([place]) 1998  . Systemic lupus erythematosus ([place]) 1998   Treated at [facility]  . Tobacco abuse, in remission    10-pack-years discontinued in 2007  . Urinary frequency    Past Surgical History:  Procedure Laterality Date  . ABDOMINAL HYSTERECTOMY  1990   Neoplasm  . CESAREAN SECTION     x2  . COLONOSCOPY  2005  . COLONOSCOPY WITH PROPOFOL N/A 08/28/2015   Dr. Laural Golden: scattered medium-mouth diverticula entire colon, external hemorrhoids  . COLONOSCOPY WITH PROPOFOL N/A 07/27/2016   Procedure: COLONOSCOPY WITH PROPOFOL;  Surgeon: Rogene Houston, MD;  [facility];  Service: Endoscopy;  Laterality: N/A;  . FINGER AMPUTATION     Third finger, bilateral, distal  . NASAL SINUS SURGERY  09/06/2011   Procedure: ENDOSCOPIC SINUS  SURGERY;  Surgeon: Ascencion Dike, MD;  Location: [facility];  Service: ENT;  Laterality: N/A;  Nasal cerebrospinal fluid leak repair with Left temporalis fascia graft and Left Ear cartilage graft  . VASCULAR SURGERY     Hands, bilaterally, [facility]; right hand partial amputation of middle finger.   Family History  Problem Relation Age of Onset  . Arthritis Mother        Rheumatoid  . Dementia Mother   . Hypertension Mother   . Cirrhosis Father   . Alcohol abuse Father   . Arthritis Maternal Grandmother   . Drug abuse Sister   . Scleroderma Brother   . Graves' disease Son   . Colon cancer Neg Hx    Social History   Socioeconomic History  . Marital  status: Single    Spouse name: Not on file  . Number of children: 2  . Years of education: 56  . Highest education level: 12th grade  Occupational History  . Occupation: Art therapist: [employer]    Comment: Disability awarded in [place]  . Financial resource strain: Hard  . Food insecurity:    Worry: Never true    Inability: Never true  . Transportation needs:    Medical: No    Non-medical: No  Tobacco Use  . Smoking status: Current Every Day Smoker    Packs/day: 0.25    Years: 25.00    Pack years: 6.25    Types: Cigarettes    Last attempt to quit: 04/07/2001    Years since quitting: 17.0  . Smokeless tobacco: Never Used  Substance and Sexual Activity  . Alcohol use: No    Alcohol/week: 0.0 standard drinks    Comment: quit in 2004  . Drug use: No    Comment: use to use pot   . Sexual activity: Not Currently    Birth control/protection: Surgical  Lifestyle  . Physical activity:    Days per week: 3 days    Minutes per session: 50 min  . Stress: Rather much  Relationships  . Social connections:    Talks on phone: More than three times a week    Gets together: Patient refused    Attends religious service: More than 4 times per year    Active member of club or organization: Yes    Attends meetings of clubs or organizations: More than 4 times per year    Relationship status: Separated  Other Topics Concern  . Not on file  Social History Narrative   Currently living with son in [place], moving back soon     Outpatient Encounter Medications as of 04/23/2018  Medication Sig  . ALPRAZolam (XANAX) 1 MG tablet Take 1 tablet (1 mg total) by mouth 3 (three) times daily.  Marland Kitchen amLODipine (NORVASC) 10 MG tablet TAKE 1 TABLET BY MOUTH DAILY  . Calcium Carbonate-Vit D-Min (CALCIUM 1200) 1200-1000 MG-UNIT CHEW Chew 1 tablet by mouth daily.  . Cholecalciferol (VITAMIN D) 2000 units CAPS Omne daily  . ferrous sulfate ([employer] IRON) 325 (65 FE) MG  tablet Take 1 tablet (325 mg total) by mouth 2 (two) times daily with a meal.  . hydrOXYzine (ATARAX/VISTARIL) 25 MG tablet Take 1 tablet (25 mg total) by mouth 3 (three) times daily as needed.  . loratadine (CLARITIN) 10 MG tablet Take 1 tablet (10 mg total) by mouth daily.  . montelukast (SINGULAIR) 10 MG tablet Take 1 tablet (10 mg total)  by mouth at bedtime.  Marland Kitchen MYRBETRIQ 25 MG TB24 tablet TAKE ONE TABLET (25MG  TOTAL) BY MOUTH DAILY  . pantoprazole (PROTONIX) 20 MG tablet Take 1 tablet (20 mg total) by mouth daily.  . promethazine-dextromethorphan (PROMETHAZINE-DM) 6.25-15 MG/5ML syrup One teaspoon daily , at bedtime, as needed for excessive cough (Patient not taking: Reported on 04/23/2018)  . zolpidem (AMBIEN) 10 MG tablet Take 1 tablet (10 mg total) by mouth at bedtime as needed for up to 30 days for sleep.   No facility-administered encounter medications on file as of 04/23/2018.     Activities of Daily Living In your present state of health, do you have any difficulty performing the following activities: 04/23/2018 04/23/2018  Hearing? N N  Vision? N -  Difficulty concentrating or making decisions? Y -  Walking or climbing stairs? N -  Dressing or bathing? N -  Doing errands, shopping? N -  Preparing Food and eating ? N -  Using the Toilet? N -  In the past six months, have you accidently leaked urine? Y -  Do you have problems with loss of bowel control? N -  Managing your Medications? N -  Managing your Finances? N -  Housekeeping or managing your Housekeeping? N -  Some recent data might be hidden    Patient Care Team: Fayrene Helper, MD as PCP - General Cloria Spring, MD as Consulting Physician [facility])    Assessment:   This is a routine wellness examination for Athalia.  Exercise Activities and Dietary recommendations Current Exercise Habits: The patient does not participate in regular exercise at present, Exercise limited by: psychological  condition(s)  Goals    . Increase physical activity     Patient would like to increase her exercise to 5 days a week.     . Quit smoking / using tobacco       Fall Risk Fall Risk  04/23/2018 01/29/2018 05/23/2017 04/17/2017 04/18/2016  Falls in the past year? 0 0 Yes Yes No  Number falls in past yr: 0 - 1 1 -  Injury with Fall? 0 - - No -  Risk for fall due to : Medication side effect - - - -  Follow up Education provided - - - -   Is the patient's home free of loose throw rugs in walkways, pet beds, electrical cords, etc?   yes      Grab bars in the bathroom? yes      Handrails on the stairs?   yes      Adequate lighting?   yes  Timed Get Up and Go performed: Patient able to perform in 5 seconds without assistance   Depression Screen PHQ 2/9 Scores 04/23/2018 01/29/2018 08/31/2017 05/25/2017  PHQ - 2 Score 3 0 2 5  PHQ- 9 Score 17 - 7 11     Cognitive Function     6CIT Screen 04/23/2018 04/17/2017 04/18/2016  What Year? 0 points 0 points 0 points  What month? 0 points 0 points 0 points  What time? 0 points 0 points 0 points  Count back from 20 0 points 0 points 0 points  Months in reverse 0 points 0 points 0 points  Repeat phrase 0 points 0 points 0 points  Total Score 0 0 0    Immunization History  Administered Date(s) Administered  . Influenza Split 01/10/2012  . Influenza Whole 12/04/2009, 12/29/2010  . Influenza,inj,Quad PF,6+ Mos 11/29/2012, 10/28/2013, 11/18/2014, 12/15/2015, 12/20/2016  . PPD Test 11/09/2011  .  Tdap 12/29/2010    Qualifies for Shingles Vaccine?N/A   Screening Tests Health Maintenance  Topic Date Due  . PAP SMEAR-Modifier  04/26/2018 (Originally 02/07/2015)  . MAMMOGRAM  03/13/2020  . TETANUS/TDAP  12/28/2020  . COLONOSCOPY  07/28/2026  . INFLUENZA VACCINE  Completed  . Hepatitis C Screening  Completed  . HIV Screening  Completed    Cancer Screenings: Lung: Low Dose CT Chest recommended if Age 42-80 years, 30 pack-year currently smoking  OR have quit w/in 15years. Patient does not qualify. Breast:  Up to date on Mammogram? Yes   Up to date of Bone Density/Dexa? Yes Colorectal: up to date   Additional Screenings:  Hepatitis C Screening: complete      Plan:   Continue to try and quite smoking, increase physical activity, and seeking mental health care   I have personally reviewed and noted the following in the patient's chart:   . Medical and social history . Use of alcohol, tobacco or illicit drugs  . Current medications and supplements . Functional ability and status . Nutritional status . Physical activity . Advanced directives . List of other physicians . Hospitalizations, surgeries, and ER visits in previous 12 months . Vitals . Screenings to include cognitive, depression, and falls . Referrals and appointments  In addition, I have reviewed and discussed with patient certain preventive protocols, quality metrics, and best practice recommendations. A written personalized care plan for preventive services as well as general preventive health recommendations were provided to patient.     Hayden Pedro, LPN  8/38/1840

## 2018-04-24 ENCOUNTER — Encounter (HOSPITAL_COMMUNITY): Payer: Self-pay | Admitting: Psychiatry

## 2018-04-24 ENCOUNTER — Ambulatory Visit (INDEPENDENT_AMBULATORY_CARE_PROVIDER_SITE_OTHER): Payer: Medicare Other | Admitting: Psychiatry

## 2018-04-24 VITALS — BP 121/85 | HR 108 | Ht 67.0 in | Wt 242.0 lb

## 2018-04-24 DIAGNOSIS — F331 Major depressive disorder, recurrent, moderate: Secondary | ICD-10-CM | POA: Diagnosis not present

## 2018-04-24 MED ORDER — ALPRAZOLAM 1 MG PO TABS: 1.0000 mg | ORAL_TABLET | Freq: Three times a day (TID) | ORAL | 2 refills | Status: DC

## 2018-04-24 MED ORDER — ZOLPIDEM TARTRATE 10 MG PO TABS: 10.0000 mg | ORAL_TABLET | Freq: Every evening | ORAL | 2 refills | Status: DC | PRN

## 2018-04-24 MED ORDER — HYDROXYZINE HCL 25 MG PO TABS: 25.0000 mg | ORAL_TABLET | Freq: Three times a day (TID) | ORAL | 2 refills | Status: DC | PRN

## 2018-04-24 NOTE — Progress Notes (Signed)
BH MD/PA/NP OP Progress Note  7/37/1062 6:94 AM Jenna Woods  MRN:  [id number]  Chief Complaint:  Chief Complaint    Anxiety; Depression; Follow-up     HPI: This patient is a 58 year old divorced black female who lives alone in [place].  Currently however she is staying with her son in [place] for the time being.  She has 2 grown sons.  She is on disability  The patient returns after 3 months for treatment of anxiety.  She states that she cannot stay in [place] with her son anymore.  He got in trouble for selling marijuana and was on probation he did not tell her.  When she found out she got very upset and they have been having fights.  She had the called the police at one point.  This "tore up my nerves by her report.  However by good luck she is now obtained a subsidized apartment in [place] and will be able to move in on March 2 and she is going to take it.  She is feeling very hopeful about this.  She states that the medicines for anxiety are still very helpful and the Ambien is helping her sleep.  She denies depression or suicidal ideation Visit Diagnosis:    ICD-10-CM   1. Major depressive disorder, recurrent episode, moderate (HCC) F33.1     Past Psychiatric History: none  Past Medical History:  Past Medical History:  Diagnosis Date  . Allergic reaction   . Allergy    perrenial   . Anxiety   . Arthritis   . Depression    h/o suicidal ideation in 2011  . Depression with anxiety   . Diverticulosis of colon 12/19/2015  . Diverticulosis of colon with hemorrhage 12/19/2015  . GERD (gastroesophageal reflux disease)   . Hypertension 1996  . Obesity   . Peripheral vascular disease ([place])   . Prediabetes 2013  . Scleroderma ([place]) 1998  . Systemic lupus erythematosus ([place]) 1998   [facility] at [facility]  . Tobacco abuse, in remission    10-pack-years discontinued in 2007  . Urinary frequency     Past Surgical History:  Procedure Laterality Date  . ABDOMINAL  HYSTERECTOMY  1990   Neoplasm  . CESAREAN SECTION     x2  . COLONOSCOPY  2005  . COLONOSCOPY WITH PROPOFOL N/A 08/28/2015   Dr. Laural Golden: scattered medium-mouth diverticula entire colon, external hemorrhoids  . COLONOSCOPY WITH PROPOFOL N/A 07/27/2016   Procedure: COLONOSCOPY WITH PROPOFOL;  Surgeon: Rogene Houston, MD;  Location: [facility];  Service: Endoscopy;  Laterality: N/A;  . FINGER AMPUTATION     Third finger, bilateral, distal  . NASAL SINUS SURGERY  09/06/2011   Procedure: ENDOSCOPIC SINUS SURGERY;  Surgeon: Ascencion Dike, MD;  Location: [facility];  Service: ENT;  Laterality: N/A;  Nasal cerebrospinal fluid leak repair with Left temporalis fascia graft and Left Ear cartilage graft  . VASCULAR SURGERY     Hands, bilaterally, [facility]; right hand partial amputation of middle finger.    Family Psychiatric History: See below  Family History:  Family History  Problem Relation Age of Onset  . Arthritis Mother        Rheumatoid  . Dementia Mother   . Hypertension Mother   . Cirrhosis Father   . Alcohol abuse Father   . Arthritis Maternal Grandmother   . Drug abuse Sister   . Scleroderma Brother   . Graves' disease Son   . Colon  cancer Neg Hx     Social History:  Social History   Socioeconomic History  . Marital status: Single    Spouse name: Not on file  . Number of children: 2  . Years of education: 33  . Highest education level: 12th grade  Occupational History  . Occupation: Art therapist: [employer]    Comment: Disability awarded in [place]  . Financial resource strain: Hard  . Food insecurity:    Worry: Never true    Inability: Never true  . Transportation needs:    Medical: No    Non-medical: No  Tobacco Use  . Smoking status: Current Every Day Smoker    Packs/day: 0.25    Years: 25.00    Pack years: 6.25    Types: Cigarettes    Last attempt to quit: 04/07/2001    Years since quitting: 17.0   . Smokeless tobacco: Never Used  Substance and Sexual Activity  . Alcohol use: No    Alcohol/week: 0.0 standard drinks    Comment: quit in 2004  . Drug use: No    Comment: use to use pot   . Sexual activity: Not Currently    Birth control/protection: Surgical  Lifestyle  . Physical activity:    Days per week: 3 days    Minutes per session: 50 min  . Stress: Rather much  Relationships  . Social connections:    Talks on phone: More than three times a week    Gets together: Patient refused    Attends religious service: More than 4 times per year    Active member of club or organization: Yes    Attends meetings of clubs or organizations: More than 4 times per year    Relationship status: Separated  Other Topics Concern  . Not on file  Social History Narrative   Currently living with son in [place], moving back soon     Allergies:  Allergies  Allergen Reactions  . Bee Venom Anaphylaxis  . Sesame Oil Anaphylaxis and Swelling    (Sesame seed)  . Aspirin     Diverticulosis with bleed  . Molds & Smuts   . Codeine Itching and Rash    Metabolic Disorder Labs: Lab Results  Component Value Date   HGBA1C 6.1 (H) 01/29/2018   MPG 128 01/29/2018   MPG 123 08/28/2017   No results found for: PROLACTIN Lab Results  Component Value Date   CHOL 126 08/28/2017   TRIG 59 08/28/2017   HDL 46 (L) 08/28/2017   CHOLHDL 2.7 08/28/2017   VLDL 15 06/30/2016   LDLCALC 66 08/28/2017   LDLCALC 56 06/30/2016   Lab Results  Component Value Date   TSH 0.64 01/29/2018   TSH 1.10 12/13/2016    Therapeutic Level Labs: No results found for: LITHIUM No results found for: VALPROATE No components found for:  CBMZ  Current Medications: Current Outpatient Medications  Medication Sig Dispense Refill  . ALPRAZolam (XANAX) 1 MG tablet Take 1 tablet (1 mg total) by mouth 3 (three) times daily. 90 tablet 2  . amLODipine (NORVASC) 10 MG tablet TAKE 1 TABLET BY MOUTH DAILY 90 tablet 1  .  Calcium Carbonate-Vit D-Min (CALCIUM 1200) 1200-1000 MG-UNIT CHEW Chew 1 tablet by mouth daily.    . Cholecalciferol (VITAMIN D) 2000 units CAPS Omne daily 30 capsule   . ferrous sulfate ([facility] IRON) 325 (65 FE) MG tablet Take 1 tablet (325 mg total) by mouth 2 (  two) times daily with a meal. 60 tablet 3  . hydrOXYzine (ATARAX/VISTARIL) 25 MG tablet Take 1 tablet (25 mg total) by mouth 3 (three) times daily as needed. 90 tablet 2  . loratadine (CLARITIN) 10 MG tablet Take 1 tablet (10 mg total) by mouth daily. 90 tablet 3  . montelukast (SINGULAIR) 10 MG tablet Take 1 tablet (10 mg total) by mouth at bedtime. 90 tablet 3  . MYRBETRIQ 25 MG TB24 tablet TAKE ONE TABLET (25MG  TOTAL) BY MOUTH DAILY 30 tablet 5  . pantoprazole (PROTONIX) 20 MG tablet Take 1 tablet (20 mg total) by mouth daily. 90 tablet 1  . promethazine-dextromethorphan (PROMETHAZINE-DM) 6.25-15 MG/5ML syrup One teaspoon daily , at bedtime, as needed for excessive cough 240 mL 0  . zolpidem (AMBIEN) 10 MG tablet Take 1 tablet (10 mg total) by mouth at bedtime as needed for up to 30 days for sleep. 30 tablet 2   No current facility-administered medications for this visit.      Musculoskeletal: Strength & Muscle Tone: within normal limits Gait & Station: normal Patient leans: N/A  Psychiatric Specialty Exam: Review of Systems  Psychiatric/Behavioral: The patient is nervous/anxious.   All other systems reviewed and are negative.   Blood pressure 121/85, pulse (!) 108, height 5\' 7"  (1.702 m), weight 242 lb (109.8 kg), SpO2 96 %.Body mass index is 37.9 kg/m.  General Appearance: Casual and Fairly Groomed  Eye Contact:  Good  Speech:  Clear and Coherent  Volume:  Normal  Mood:  Anxious  Affect:  Appropriate and Congruent  Thought Process:  Goal Directed  Orientation:  Full (Time, Place, and Person)  Thought Content: Rumination   Suicidal Thoughts:  No  Homicidal Thoughts:  No  Memory:  Immediate;   Good Recent;    Good Remote;   Good  Judgement:  Good  Insight:  Fair  Psychomotor Activity:  Normal  Concentration:  Concentration: Good and Attention Span: Good  Recall:  Good  Fund of Knowledge: Good  Language: Good  Akathisia:  No  Handed:  Right  AIMS (if indicated): not done  Assets:  Communication Skills Desire for Improvement Physical Health Resilience Social Support Talents/Skills  ADL's:  Intact  Cognition: WNL  Sleep:  Good   Screenings: GAD-7     Virtual BH Phone Follow Up from 05/25/2017 in [facility]  Total GAD-7 Score  13    PHQ2-9     Clinical Support from 04/23/2018 in [facility] Office Visit from 01/29/2018 in [facility] Office Visit from 08/31/2017 in [facility] Phone Follow Up from 05/25/2017 in [facility] Visit from 05/23/2017 in [facility]  PHQ-2 Total Score  3  0  2  5  4   PHQ-9 Total Score  17  -  7  11  18        Assessment and Plan:  This patient is a 58 year old female with a history of primarily anxiety.  Although her PHQ 9 score was high yesterday at primary care she denies depression with me today.  She states that she is going to feel much better when she is able to move away from her son.  For now she will continue Xanax 1 mg 3 times daily as needed for anxiety, hydroxyzine 25 mg 3 times daily as needed for anxiety and Ambien 10 mg at bedtime for sleep.  She will return to see me in 3 months  Levonne Spiller,  MD 04/24/2018, 9:48 AM

## 2018-04-27 ENCOUNTER — Other Ambulatory Visit (HOSPITAL_COMMUNITY): Payer: Self-pay | Admitting: Psychiatry

## 2018-04-30 ENCOUNTER — Ambulatory Visit: Payer: Self-pay | Admitting: Family Medicine

## 2018-04-30 ENCOUNTER — Ambulatory Visit (HOSPITAL_COMMUNITY): Payer: Medicaid Other | Admitting: Psychiatry

## 2018-05-01 ENCOUNTER — Ambulatory Visit (HOSPITAL_COMMUNITY): Payer: Medicaid Other | Admitting: Psychiatry

## 2018-05-26 IMAGING — NM NM GI BLOOD LOSS
2 series · 12 of 12 positions shown · non-contrast
Comparison: None

CLINICAL DATA: GI bleeding since 07/25/2016, had bleeding in
December 2015 with polyps identified at endoscopy

EXAM:
NUCLEAR MEDICINE GASTROINTESTINAL BLEEDING SCAN
TECHNIQUE: Sequential abdominal images were obtained following intravenous
administration of Bc-HHm labeled red blood cells.
RADIOPHARMACEUTICALS:  18 mCi Bc-HHm pertechnetate in-vitro labeled
autologous red cells.

[Series 1: gi bleed · 4.14mm/px · 6 of 60 frames shown (1 of 2)]
[frame 6/60]
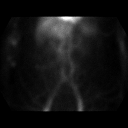
[frame 16/60]
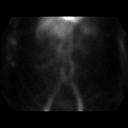
[frame 26/60]
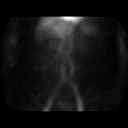
[frame 36/60]
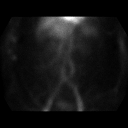
[frame 46/60]
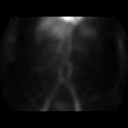
[frame 56/60]
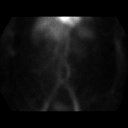

[Series 2: gi bleed · 4.14mm/px · 6 of 30 frames shown (2 of 2)]
[frame 3/30]
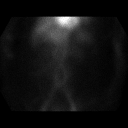
[frame 8/30]
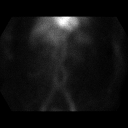
[frame 13/30]
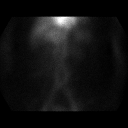
[frame 18/30]
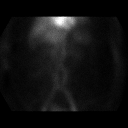
[frame 23/30]
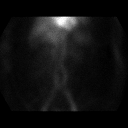
[frame 28/30]
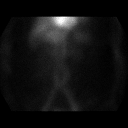

[12 of 12 positions shown; findings below may reference images not displayed]

FINDINGS: Imaging performed for 90 minutes.

Normal blood pool distribution of labeled red cells.

No abnormal gastrointestinal localization of red cells identified to
suggest active GI bleeding.
IMPRESSION: Negative GI bleeding scan through 90 minutes.

## 2018-06-21 ENCOUNTER — Other Ambulatory Visit (HOSPITAL_COMMUNITY): Payer: Self-pay | Admitting: Psychiatry

## 2018-06-21 ENCOUNTER — Telehealth (HOSPITAL_COMMUNITY): Payer: Self-pay | Admitting: *Deleted

## 2018-06-21 MED ORDER — HYDROXYZINE HCL 25 MG PO TABS: 25.0000 mg | ORAL_TABLET | Freq: Three times a day (TID) | ORAL | 2 refills | Status: DC | PRN

## 2018-06-21 MED ORDER — ALPRAZOLAM 1 MG PO TABS: 1.0000 mg | ORAL_TABLET | Freq: Three times a day (TID) | ORAL | 2 refills | Status: DC

## 2018-06-21 MED ORDER — ZOLPIDEM TARTRATE 10 MG PO TABS: 10.0000 mg | ORAL_TABLET | Freq: Every evening | ORAL | 2 refills | Status: DC | PRN

## 2018-06-21 NOTE — Telephone Encounter (Signed)
Dr Harrington Challenger Patient called to inform that she has moved back to [facility]. New Address updated in system. Patient is also requesting refill on medication previous Rx [facility] in [place] will not transfer to the [facility]. Stated new scripts would be needed, next visit is 07-24-2018

## 2018-06-21 NOTE — Telephone Encounter (Signed)
sent 

## 2018-07-02 ENCOUNTER — Other Ambulatory Visit: Payer: Self-pay | Admitting: Family Medicine

## 2018-07-04 ENCOUNTER — Other Ambulatory Visit: Payer: Self-pay

## 2018-07-04 ENCOUNTER — Ambulatory Visit (INDEPENDENT_AMBULATORY_CARE_PROVIDER_SITE_OTHER): Payer: Medicare Other | Admitting: Family Medicine

## 2018-07-04 ENCOUNTER — Encounter: Payer: Self-pay | Admitting: Family Medicine

## 2018-07-04 VITALS — BP 108/82 | Ht 67.0 in | Wt 240.0 lb

## 2018-07-04 DIAGNOSIS — Z79899 Other long term (current) drug therapy: Secondary | ICD-10-CM

## 2018-07-04 DIAGNOSIS — I10 Essential (primary) hypertension: Secondary | ICD-10-CM | POA: Diagnosis not present

## 2018-07-04 DIAGNOSIS — F418 Other specified anxiety disorders: Secondary | ICD-10-CM

## 2018-07-04 DIAGNOSIS — F5105 Insomnia due to other mental disorder: Secondary | ICD-10-CM | POA: Diagnosis not present

## 2018-07-04 DIAGNOSIS — K219 Gastro-esophageal reflux disease without esophagitis: Secondary | ICD-10-CM | POA: Diagnosis not present

## 2018-07-04 DIAGNOSIS — R7302 Impaired glucose tolerance (oral): Secondary | ICD-10-CM

## 2018-07-04 DIAGNOSIS — F325 Major depressive disorder, single episode, in full remission: Secondary | ICD-10-CM

## 2018-07-04 DIAGNOSIS — N39498 Other specified urinary incontinence: Secondary | ICD-10-CM

## 2018-07-04 DIAGNOSIS — R7301 Impaired fasting glucose: Secondary | ICD-10-CM

## 2018-07-04 NOTE — Assessment & Plan Note (Signed)
Sleep hygiene reviewed and written information offered also. Prescription sent for  medication needed.  

## 2018-07-04 NOTE — Assessment & Plan Note (Signed)
Obesity linked to hypertension, depression, and IGT Unchanged  Patient re-educated about  the importance of commitment to a  minimum of 150 minutes of exercise per week as able.  The importance of healthy food choices with portion control discussed, as well as eating regularly and within a 12 hour window most days. The need to choose "clean , green" food 50 to 75% of the time is discussed, as well as to make water the primary drink and set a goal of 64 ounces water daily.     Weight /BMI 07/04/2018 04/23/2018 01/29/2018  WEIGHT 240 lb 240 lb 243 lb  HEIGHT 5\' 7"  5\' 7"  5\' 7"   BMI 37.59 kg/m2 37.59 kg/m2 38.06 kg/m2  Some encounter information is confidential and restricted. Go to Review Flowsheets activity to see all data.

## 2018-07-04 NOTE — Assessment & Plan Note (Signed)
Controlled, no change in medication  

## 2018-07-04 NOTE — Assessment & Plan Note (Signed)
Controlled, no change in medication DASH diet and commitment to daily physical activity for a minimum of 30 minutes discussed and encouraged, as a part of hypertension management. The importance of attaining a healthy weight is also discussed.  BP/Weight 07/04/2018 04/23/2018 01/29/2018 08/31/2017 05/23/2017 04/17/2017 10/29/2351  Systolic BP 614 431 540 086 761 950 932  Diastolic BP 82 82 84 80 84 74 90  Wt. (Lbs) 240 240 243 240 235 235 230.9  BMI 37.59 37.59 38.06 37.59 36.81 35.73 35.11  Some encounter information is confidential and restricted. Go to Review Flowsheets activity to see all data.

## 2018-07-04 NOTE — Progress Notes (Signed)
Virtual Visit via Telephone Note  I connected with Jenna Woods on 76/28/31 at  2:40 PM EDT by telephone and verified that I am speaking with the correct person using two identifiers.  Location: Patient: in her home in [place], moved on 05/09/2018 Provider: in office    I discussed the limitations, risks, security and privacy concerns of performing an evaluation and management service by telephone and the availability of in person appointments. I also discussed with the patient that there may be a patient responsible charge related to this service. The patient expressed understanding and agreed to proceed.   History of Present Illness: F/u chromic problems and medication review, also update routine health maintenance Now living independently in  [place] in [place] x 2 months, and she enjoys this tremendously No concerns at this time   Observations/Objective: BP 108/82   Ht 5\' 7"  (1.702 m)   Wt 240 lb (108.9 kg)   BMI 37.59 kg/m  Good communication with no confusion and intact memory. Alert and oriented x 3 No signs of respiratory distress during sppech    Assessment and Plan:  Hypertension Controlled, no change in medication DASH diet and commitment to daily physical activity for a minimum of 30 minutes discussed and encouraged, as a part of hypertension management. The importance of attaining a healthy weight is also discussed.  BP/Weight 07/04/2018 04/23/2018 01/29/2018 08/31/2017 05/23/2017 04/17/2017 07/22/6158  Systolic BP 737 106 269 485 462 703 500  Diastolic BP 82 82 84 80 84 74 90  Wt. (Lbs) 240 240 243 240 235 235 230.9  BMI 37.59 37.59 38.06 37.59 36.81 35.73 35.11  Some encounter information is confidential and restricted. Go to Review Flowsheets activity to see all data.       Insomnia due to mental disorder Sleep hygiene reviewed and written information offered also. Prescription sent for  medication needed.   Depression with anxiety Controlled,  no change in medication   GERD (gastroesophageal reflux disease) Controlled, no change in medication   Urinary incontinence Controlled, no change in medication   IGT (impaired glucose tolerance) Patient educated about the importance of limiting  Carbohydrate intake , the need to commit to daily physical activity for a minimum of 30 minutes , and to commit weight loss. The fact that changes in all these areas will reduce or eliminate all together the development of diabetes is stressed.  Updated lab needed at/ before next visit.   Diabetic Labs Latest Ref Rng & Units 01/29/2018 08/28/2017 12/13/2016 07/29/2016 07/27/2016  HbA1c <5.7 % of total Hgb 6.1(H) 5.9(H) - - -  Chol <200 mg/dL - 126 - - -  HDL >50 mg/dL - 46(L) - - -  Calc LDL mg/dL (calc) - 66 - - -  Triglycerides <150 mg/dL - 59 - - -  Creatinine 0.50 - 1.05 mg/dL 0.70 0.82 0.70 0.64 0.66   BP/Weight 07/04/2018 04/23/2018 01/29/2018 08/31/2017 05/23/2017 04/17/2017 9/38/1829  Systolic BP 937 169 678 938 101 751 025  Diastolic BP 82 82 84 80 84 74 90  Wt. (Lbs) 240 240 243 240 235 235 230.9  BMI 37.59 37.59 38.06 37.59 36.81 35.73 35.11  Some encounter information is confidential and restricted. Go to Review Flowsheets activity to see all data.   No flowsheet data found.    Morbid obesity ([place]) Obesity linked to hypertension, depression, and IGT Unchanged  Patient re-educated about  the importance of commitment to a  minimum of 150 minutes of exercise per week as able.  The importance of healthy food choices with portion control discussed, as well as eating regularly and within a 12 hour window most days. The need to choose "clean , green" food 50 to 75% of the time is discussed, as well as to make water the primary drink and set a goal of 64 ounces water daily.     Weight /BMI 07/04/2018 04/23/2018 01/29/2018  WEIGHT 240 lb 240 lb 243 lb  HEIGHT 5\' 7"  5\' 7"  5\' 7"   BMI 37.59 kg/m2 37.59 kg/m2 38.06 kg/m2  Some encounter  information is confidential and restricted. Go to Review Flowsheets activity to see all data.      Major depression Controlled on current medication , currently being treated by Psychiatry and will conitnue this. New and improved living condition plays a major factor    Follow Up Instructions:    I discussed the assessment and treatment plan with the patient. The patient was provided an opportunity to ask questions and all were answered. The patient agreed with the plan and demonstrated an understanding of the instructions.   The patient was advised to call back or seek an in-person evaluation if the symptoms worsen or if the condition fails to improve as anticipated.  I provided 22 minutes of non-face-to-face time during this encounter.   [name], MD

## 2018-07-04 NOTE — Assessment & Plan Note (Signed)
Controlled on current medication , currently being treated by Psychiatry and will conitnue this. New and improved living condition plays a major factor

## 2018-07-04 NOTE — Assessment & Plan Note (Signed)
Patient educated about the importance of limiting  Carbohydrate intake , the need to commit to daily physical activity for a minimum of 30 minutes , and to commit weight loss. The fact that changes in all these areas will reduce or eliminate all together the development of diabetes is stressed.  Updated lab needed at/ before next visit.   Diabetic Labs Latest Ref Rng & Units 01/29/2018 08/28/2017 12/13/2016 07/29/2016 07/27/2016  HbA1c <5.7 % of total Hgb 6.1(H) 5.9(H) - - -  Chol <200 mg/dL - 126 - - -  HDL >50 mg/dL - 46(L) - - -  Calc LDL mg/dL (calc) - 66 - - -  Triglycerides <150 mg/dL - 59 - - -  Creatinine 0.50 - 1.05 mg/dL 0.70 0.82 0.70 0.64 0.66   BP/Weight 07/04/2018 04/23/2018 01/29/2018 08/31/2017 05/23/2017 04/17/2017 5/97/4163  Systolic BP 845 364 680 321 224 825 003  Diastolic BP 82 82 84 80 84 74 90  Wt. (Lbs) 240 240 243 240 235 235 230.9  BMI 37.59 37.59 38.06 37.59 36.81 35.73 35.11  Some encounter information is confidential and restricted. Go to Review Flowsheets activity to see all data.   No flowsheet data found.

## 2018-07-04 NOTE — Patient Instructions (Addendum)
Annual exam with MD, no pelvic or pap indicated, in 6 months, call if you need me before  Congrats , and I am thankful that you have your new home that you are so very comfortable in  Keep walking regularly , also healthy food choice and no smoking  Please get fasting lipid, cmp and EGFr and hBA1C  last week in June ([facility] [place]), we will mail the order to you  Social distancing. Frequent hand washing with soap and water Keeping your hands off of your face.and keep face covered when you go out  Thanks for choosing [facility], we consider it a privelige to serve you.  These 3 practices will help to keep both you and your community healthy during this time. Please practice them faithfully!

## 2018-07-24 ENCOUNTER — Encounter (HOSPITAL_COMMUNITY): Payer: Self-pay | Admitting: Psychiatry

## 2018-07-24 ENCOUNTER — Ambulatory Visit (INDEPENDENT_AMBULATORY_CARE_PROVIDER_SITE_OTHER): Payer: Medicare Other | Admitting: Psychiatry

## 2018-07-24 ENCOUNTER — Other Ambulatory Visit: Payer: Self-pay

## 2018-07-24 DIAGNOSIS — F411 Generalized anxiety disorder: Secondary | ICD-10-CM | POA: Diagnosis not present

## 2018-07-24 MED ORDER — DIAZEPAM 5 MG PO TABS: 5.0000 mg | ORAL_TABLET | Freq: Every day | ORAL | 2 refills | Status: DC

## 2018-07-24 MED ORDER — ALPRAZOLAM 1 MG PO TABS: 1.0000 mg | ORAL_TABLET | Freq: Three times a day (TID) | ORAL | 2 refills | Status: DC

## 2018-07-24 NOTE — Progress Notes (Signed)
Virtual Visit via Video Note  I connected with Jenna Woods on 36/46/80 at 11:00 AM EDT by a video enabled telemedicine application and verified that I am speaking with the correct person using two identifiers.   I discussed the limitations of evaluation and management by telemedicine and the availability of in person appointments. The patient expressed understanding and agreed to proceed.      I discussed the assessment and treatment plan with the patient. The patient was provided an opportunity to ask questions and all were answered. The patient agreed with the plan and demonstrated an understanding of the instructions.   The patient was advised to call back or seek an in-person evaluation if the symptoms worsen or if the condition fails to improve as anticipated.  I provided 15 minutes of non-face-to-face time during this encounter.   Levonne Spiller, MD  [facility] MD/PA/NP OP Progress Note  05/26/2246 25:00 AM Jenna Woods  MRN:  [id number]  Chief Complaint:  Chief Complaint    Depression; Anxiety; Follow-up     HPI: This patient is a 58 year old divorced black female who lives alone now in [place].  She has 2 grown sons and is on disability.  The patient returns after 3 months for treatment of anxiety.  She did get a subsidized apartment in [place] and is very happy with it.  However she has been very stressed because a young woman that she raised "like a daughter" was recently diagnosed with breast cancer had double mastectomy and now is found that she has cancer in the lymph nodes and has to undergo chemotherapy.  The patient is feeling really badly about this.  She is also going through the anniversary of her brother's death from cancer.  The patient states that she had not been sleeping well until a friend gave her to Valium to try and she is stated that this was "the best sleep of gotten a long time."  She states that the Ambien is no longer working and she would rather  take Valium at bedtime.  I think this is okay as long as she does not combine it with the Xanax.  She denies any serious symptoms of depression and does not want to be on antidepressants because they made her feel very nervous.  She denies any thoughts of suicide or self-harm. Visit Diagnosis:    ICD-10-CM   1. Anxiety state F41.1     Past Psychiatric History: none  Past Medical History:  Past Medical History:  Diagnosis Date  . Allergic reaction   . Allergy    perrenial   . Anxiety   . Arthritis   . Depression    h/o suicidal ideation in 2011  . Depression with anxiety   . Diverticulosis of colon 12/19/2015  . Diverticulosis of colon with hemorrhage 12/19/2015  . GERD (gastroesophageal reflux disease)   . Hypertension 1996  . Obesity   . Peripheral vascular disease ([place])   . Prediabetes 2013  . Scleroderma ([place]) 1998  . Systemic lupus erythematosus ([place]) 1998   T[facility] at [facility]  . Tobacco abuse, in remission    10-pack-years discontinued in 2007  . Urinary frequency     Past Surgical History:  Procedure Laterality Date  . ABDOMINAL HYSTERECTOMY  1990   Neoplasm  . CESAREAN SECTION     x2  . COLONOSCOPY  2005  . COLONOSCOPY WITH PROPOFOL N/A 08/28/2015   Dr. Laural Golden: scattered medium-mouth diverticula entire colon, external hemorrhoids  . COLONOSCOPY WITH  PROPOFOL N/A 07/27/2016   Procedure: COLONOSCOPY WITH PROPOFOL;  Surgeon: Rogene Houston, MD;  [facility];  Service: Endoscopy;  Laterality: N/A;  . FINGER AMPUTATION     Third finger, bilateral, distal  . NASAL SINUS SURGERY  09/06/2011   Procedure: ENDOSCOPIC SINUS SURGERY;  Surgeon: Ascencion Dike, MD;  Location: [place];  Service: ENT;  Laterality: N/A;  Nasal cerebrospinal fluid leak repair with Left temporalis fascia graft and Left Ear cartilage graft  . VASCULAR SURGERY     Hands, bilaterally, [facility]; right hand partial amputation of middle finger.    Family Psychiatric  History: see below  Family History:  Family History  Problem Relation Age of Onset  . Arthritis Mother        Rheumatoid  . Dementia Mother   . Hypertension Mother   . Cirrhosis Father   . Alcohol abuse Father   . Arthritis Maternal Grandmother   . Drug abuse Sister   . Scleroderma Brother   . Graves' disease Son   . Colon cancer Neg Hx     Social History:  Social History   Socioeconomic History  . Marital status: Single    Spouse name: Not on file  . Number of children: 2  . Years of education: 77  . Highest education level: 12th grade  Occupational History  . Occupation: Art therapist: [employer]    Comment: Disability awarded in [place]  . Financial resource strain: Hard  . Food insecurity:    Worry: Never true    Inability: Never true  . Transportation needs:    Medical: No    Non-medical: No  Tobacco Use  . Smoking status: Former Smoker    Packs/day: 0.25    Years: 25.00    Pack years: 6.25    Types: Cigarettes    Last attempt to quit: 04/07/2001    Years since quitting: 17.3  . Smokeless tobacco: Never Used  . Tobacco comment: quit 88 days ago!!  Substance and Sexual Activity  . Alcohol use: No    Alcohol/week: 0.0 standard drinks    Comment: quit in 2004  . Drug use: No    Comment: use to use pot   . Sexual activity: Not Currently    Birth control/protection: Surgical  Lifestyle  . Physical activity:    Days per week: 3 days    Minutes per session: 50 min  . Stress: Rather much  Relationships  . Social connections:    Talks on phone: More than three times a week    Gets together: Patient refused    Attends religious service: More than 4 times per year    Active member of club or organization: Yes    Attends meetings of clubs or organizations: More than 4 times per year    Relationship status: Separated  Other Topics Concern  . Not on file  Social History Narrative   Currently living with son in [place],  moving back soon     Allergies:  Allergies  Allergen Reactions  . Bee Venom Anaphylaxis  . Sesame Oil Anaphylaxis and Swelling    (Sesame seed)  . Aspirin     Diverticulosis with bleed  . Molds & Smuts   . Codeine Itching and Rash    Metabolic Disorder Labs: Lab Results  Component Value Date   HGBA1C 6.1 (H) 01/29/2018   MPG 128 01/29/2018   MPG 123 08/28/2017  No results found for: PROLACTIN Lab Results  Component Value Date   CHOL 126 08/28/2017   TRIG 59 08/28/2017   HDL 46 (L) 08/28/2017   CHOLHDL 2.7 08/28/2017   VLDL 15 06/30/2016   LDLCALC 66 08/28/2017   LDLCALC 56 06/30/2016   Lab Results  Component Value Date   TSH 0.64 01/29/2018   TSH 1.10 12/13/2016    Therapeutic Level Labs: No results found for: LITHIUM No results found for: VALPROATE No components found for:  CBMZ  Current Medications: Current Outpatient Medications  Medication Sig Dispense Refill  . ALPRAZolam (XANAX) 1 MG tablet Take 1 tablet (1 mg total) by mouth 3 (three) times daily. 90 tablet 2  . amLODipine (NORVASC) 10 MG tablet TAKE 1 TABLET BY MOUTH DAILY 90 tablet 1  . Calcium Carbonate-Vit D-Min (CALCIUM 1200) 1200-1000 MG-UNIT CHEW Chew 1 tablet by mouth daily.    . Cholecalciferol (VITAMIN D) 2000 units CAPS Omne daily 30 capsule   . diazepam (VALIUM) 5 MG tablet Take 1 tablet (5 mg total) by mouth at bedtime. 30 tablet 2  . ferrous sulfate (CVS IRON) 325 (65 FE) MG tablet Take 1 tablet (325 mg total) by mouth 2 (two) times daily with a meal. 60 tablet 3  . montelukast (SINGULAIR) 10 MG tablet Take 1 tablet (10 mg total) by mouth at bedtime. 90 tablet 3  . MYRBETRIQ 25 MG TB24 tablet TAKE 1 TABLET BY MOUTH DAILY 30 tablet 5  . pantoprazole (PROTONIX) 20 MG tablet Take 1 tablet (20 mg total) by mouth daily. 90 tablet 1   No current facility-administered medications for this visit.      Musculoskeletal: Strength & Muscle Tone: within normal limits Gait & Station:  normal Patient leans: N/A  Psychiatric Specialty Exam: Review of Systems  Psychiatric/Behavioral: The patient is nervous/anxious.   All other systems reviewed and are negative.   There were no vitals taken for this visit.There is no height or weight on file to calculate BMI.  General Appearance: NA  Eye Contact:  NA  Speech:  Clear and Coherent  Volume:  Normal  Mood:  Anxious  Affect:  NA  Thought Process:  Goal Directed  Orientation:  Full (Time, Place, and Person)  Thought Content: Rumination   Suicidal Thoughts:  No  Homicidal Thoughts:  No  Memory:  Immediate;   Good Recent;   Good Remote;   Fair  Judgement:  Fair  Insight:  Fair  Psychomotor Activity:  Normal  Concentration:  Concentration: Good and Attention Span: Good  Recall:  Good  Fund of Knowledge: Fair  Language: Good  Akathisia:  No  Handed:  Right  AIMS (if indicated): not done  Assets:  Communication Skills Desire for Improvement Resilience Social Support Talents/Skills  ADL's:  Intact  Cognition: WNL  Sleep:  Fair   Screenings: GAD-7     [facility] Phone Follow Up from 05/25/2017 in [place]  Total GAD-7 Score  13    PHQ2-9     Office Visit from 07/04/2018 in [facility] Clinical Support from 04/23/2018 in [facility] Office Visit from 01/29/2018 in [facility] Office Visit from 08/31/2017 in [facility] [facility] Phone Follow Up from 05/25/2017 in [place]  PHQ-2 Total Score  1  3  0  2  5  PHQ-9 Total Score  2  17  -  7  11       Assessment and Plan:  This patient is a 44-year-old female with a history of anxiety.  She had not been sleeping well with Ambien so we will switch to Valium 5 mg at bedtime.  She takes Xanax 1 mg up to 3 times daily but usually only takes it once or twice a day.  She is been told not to combine the 2 medications.  She will return to see me in 3 months   Levonne Spiller,  MD 07/24/2018, 11:30 AM

## 2018-08-09 ENCOUNTER — Other Ambulatory Visit: Payer: Self-pay | Admitting: Family Medicine

## 2018-09-01 ENCOUNTER — Other Ambulatory Visit: Payer: Self-pay | Admitting: Family Medicine

## 2018-09-03 DIAGNOSIS — I10 Essential (primary) hypertension: Secondary | ICD-10-CM | POA: Diagnosis not present

## 2018-09-03 DIAGNOSIS — R7301 Impaired fasting glucose: Secondary | ICD-10-CM | POA: Diagnosis not present

## 2018-09-03 DIAGNOSIS — Z79899 Other long term (current) drug therapy: Secondary | ICD-10-CM | POA: Diagnosis not present

## 2018-09-04 ENCOUNTER — Encounter: Payer: Self-pay | Admitting: Family Medicine

## 2018-09-04 LAB — LIPID PANEL
Cholesterol: 136 mg/dL (ref <200)
HDL: 50 mg/dL (ref >=50)
LDL Cholesterol (Calc): 68 mg/dL (calc)
Non-HDL Cholesterol (Calc): 86 mg/dL (calc) (ref <130)
Total CHOL/HDL Ratio: 2.7 (calc) (ref <5.0)
Triglycerides: 97 mg/dL (ref <150)

## 2018-09-04 LAB — COMPLETE METABOLIC PANEL WITH GFR
AG Ratio: 1.5 (calc) (ref 1.0–2.5)
ALT: 10 U/L (ref 6–29)
AST: 13 U/L (ref 10–35)
Albumin: 3.8 g/dL (ref 3.6–5.1)
Alkaline phosphatase (APISO): 65 U/L (ref 37–153)
BUN: 12 mg/dL (ref 7–25)
CO2: 28 mmol/L (ref 20–32)
Calcium: 9.3 mg/dL (ref 8.6–10.4)
Chloride: 107 mmol/L (ref 98–110)
Creat: 0.86 mg/dL (ref 0.50–0.99)
GFR, Est African American: 85 mL/min/{1.73_m2} (ref >=60)
GFR, Est Non African American: 73 mL/min/{1.73_m2} (ref >=60)
Globulin: 2.6 g/dL (calc) (ref 1.9–3.7)
Glucose, Bld: 95 mg/dL (ref 65–99)
Potassium: 4.1 mmol/L (ref 3.5–5.3)
Sodium: 140 mmol/L (ref 135–146)
Total Bilirubin: 0.3 mg/dL (ref 0.2–1.2)
Total Protein: 6.4 g/dL (ref 6.1–8.1)

## 2018-09-04 LAB — HEMOGLOBIN A1C
Hgb A1c MFr Bld: 6 % of total Hgb — ABNORMAL HIGH (ref <5.7)
Mean Plasma Glucose: 126 (calc)
eAG (mmol/L): 7 (calc)

## 2018-09-20 ENCOUNTER — Other Ambulatory Visit (HOSPITAL_COMMUNITY): Payer: Self-pay | Admitting: Psychiatry

## 2018-10-03 ENCOUNTER — Encounter: Payer: Self-pay | Admitting: Family Medicine

## 2018-10-03 ENCOUNTER — Ambulatory Visit (INDEPENDENT_AMBULATORY_CARE_PROVIDER_SITE_OTHER): Payer: Medicare Other | Admitting: Family Medicine

## 2018-10-03 ENCOUNTER — Other Ambulatory Visit: Payer: Self-pay

## 2018-10-03 VITALS — BP 130/91 | HR 91 | Temp 98.1°F | Resp 12 | Ht 68.0 in | Wt 240.0 lb

## 2018-10-03 DIAGNOSIS — R05 Cough: Secondary | ICD-10-CM | POA: Diagnosis not present

## 2018-10-03 DIAGNOSIS — R059 Cough, unspecified: Secondary | ICD-10-CM

## 2018-10-03 MED ORDER — PROMETHAZINE-DM 6.25-15 MG/5ML PO SYRP: ORAL_SOLUTION | ORAL | 0 refills | Status: DC

## 2018-10-03 MED ORDER — BENZONATATE 100 MG PO CAPS: 100.0000 mg | ORAL_CAPSULE | Freq: Three times a day (TID) | ORAL | 0 refills | Status: DC | PRN

## 2018-10-03 NOTE — Patient Instructions (Signed)
    Thank you for coming into the office today. I appreciate the opportunity to provide you with the care for your health and wellness. Today we discussed:   Follow Up: as needed  No labs or referrals today.  Take medication for cough as directed.  Please watch your breathing. If it becomes hard to breath or you develop shortness of breath call the office or go to the nearest ED if after hours and on weekends.  Keep throat lubricated to help cough as well.  Please continue to practice social distancing to keep you, your family, and our community safe.  If you must go out, please wear a Mask and practice good handwashing.  [place] YOUR HANDS WELL AND FREQUENTLY. AVOID TOUCHING YOUR FACE, UNLESS YOUR HANDS ARE FRESHLY WASHED.  GET FRESH AIR DAILY. STAY HYDRATED WITH WATER.   It was a pleasure to see you and I look forward to continuing to work together on your health and well-being. Please do not hesitate to call the office if you need care or have questions about your care.  Have a wonderful day and week. With Gratitude, Cherly Beach, DNP, AGNP-BC

## 2018-10-03 NOTE — Progress Notes (Signed)
Virtual Visit via Telephone Note   This visit type was conducted due to national recommendations for restrictions regarding the COVID-19 Pandemic (e.g. social distancing) in an effort to limit this patient's exposure and mitigate transmission in our community.  Due to her co-morbid illnesses, this patient is at least at moderate risk for complications without adequate follow up.  This format is felt to be most appropriate for this patient at this time.  The patient did not have access to video technology/had technical difficulties with video requiring transitioning to audio format only (telephone).  All issues noted in this document were discussed and addressed.  No physical exam could be performed with this format.    Evaluation Performed:  Follow-up visit  Date:  8/67/6195   ID:  Jenna, Woods 08/20/1960, MRN [id number]  Patient Location: Home Provider Location: Office  Location of Patient: Home Location of Provider: Telehealth Consent was obtain for visit to be over via telehealth. I verified that I am speaking with the correct person using two identifiers.  PCP:  Fayrene Helper, MD   Chief Complaint:  cough  History of Present Illness:    Jenna Woods is a 58 y.o. female with cough.  Reports a cough for 2 days now. She states she got caught in the rain and didn't have an umbrella. Denies fever, chills, headache or shortness of breath. Denies exposure to COVID. Reports good appetite. Cough makes it hard to sleep and wears her out during the day. Staying hydrated. Tried tussin OTC without relief.   The patient does not have all symptoms concerning for COVID-19 infection (fever, chills, or new shortness of breath).   Past Medical, Surgical, Social History, Allergies, and Medications have been Reviewed.   Past Medical History:  Diagnosis Date  . Allergic reaction   . Allergy    perrenial   . Anxiety   . Arthritis   . Depression    h/o suicidal  ideation in 2011  . Depression with anxiety   . Diverticulosis of colon 12/19/2015  . Diverticulosis of colon with hemorrhage 12/19/2015  . GERD (gastroesophageal reflux disease)   . Hypertension 1996  . Obesity   . Peripheral vascular disease ([place])   . Prediabetes 2013  . Scleroderma ([place]) 1998  . Systemic lupus erythematosus ([place]) 1998   Treated at [facility]  . Tobacco abuse, in remission    10-pack-years discontinued in 2007  . Urinary frequency    Past Surgical History:  Procedure Laterality Date  . ABDOMINAL HYSTERECTOMY  1990   Neoplasm  . CESAREAN SECTION     x2  . COLONOSCOPY  2005  . COLONOSCOPY WITH PROPOFOL N/A 08/28/2015   Dr. Laural Golden: scattered medium-mouth diverticula entire colon, external hemorrhoids  . COLONOSCOPY WITH PROPOFOL N/A 07/27/2016   Procedure: COLONOSCOPY WITH PROPOFOL;  Surgeon: Rogene Houston, MD;  [facility];  Service: Endoscopy;  Laterality: N/A;  . FINGER AMPUTATION     Third finger, bilateral, distal  . NASAL SINUS SURGERY  09/06/2011   Procedure: ENDOSCOPIC SINUS SURGERY;  Surgeon: Ascencion Dike, MD;  Location: [place];  Service: ENT;  Laterality: N/A;  Nasal cerebrospinal fluid leak repair with Left temporalis fascia graft and Left Ear cartilage graft  . VASCULAR SURGERY     Hands, bilaterally, [facility]; right hand partial amputation of middle finger.     Current Meds  Medication Sig  . ALPRAZolam (XANAX) 1 MG tablet Take 1  tablet (1 mg total) by mouth 3 (three) times daily.  Marland Kitchen amLODipine (NORVASC) 10 MG tablet TAKE 1 TABLET BY MOUTH DAILY  . Calcium Carbonate-Vit D-Min (CALCIUM 1200) 1200-1000 MG-UNIT CHEW Chew 1 tablet by mouth daily.  . Cholecalciferol (VITAMIN D) 2000 units CAPS Omne daily  . diazepam (VALIUM) 5 MG tablet Take 1 tablet (5 mg total) by mouth at bedtime.  . ferrous sulfate (CVS IRON) 325 (65 FE) MG tablet Take 1 tablet (325 mg total) by mouth 2 (two) times daily with a meal.  .  montelukast (SINGULAIR) 10 MG tablet Take 1 tablet (10 mg total) by mouth at bedtime.  Marland Kitchen MYRBETRIQ 25 MG TB24 tablet TAKE 1 TABLET BY MOUTH DAILY  . pantoprazole (PROTONIX) 20 MG tablet TAKE 1 TABLET(20 MG) BY MOUTH DAILY     Allergies:   Bee venom, Sesame oil, Aspirin, Molds & smuts, and Codeine   Social History   Tobacco Use  . Smoking status: Former Smoker    Packs/day: 0.25    Years: 25.00    Pack years: 6.25    Types: Cigarettes    Quit date: 04/07/2001    Years since quitting: 17.5  . Smokeless tobacco: Never Used  . Tobacco comment: quit 88 days ago!!  Substance Use Topics  . Alcohol use: No    Alcohol/week: 0.0 standard drinks    Comment: quit in 2004  . Drug use: No    Comment: use to use pot      Family Hx: The patient's family history includes Alcohol abuse in her father; Arthritis in her maternal grandmother and mother; Cirrhosis in her father; Dementia in her mother; Drug abuse in her sister; Berenice Primas' disease in her son; Hypertension in her mother; Scleroderma in her brother. There is no history of Colon cancer.  ROS:   Please see the history of present illness.    All other systems reviewed and are negative.   Labs/Other Tests and Data Reviewed:    Recent Labs: 01/29/2018: Hemoglobin 13.2; Platelets 337; TSH 0.64 09/03/2018: ALT 10; BUN 12; Creat 0.86; Potassium 4.1; Sodium 140   Recent Lipid Panel Lab Results  Component Value Date/Time   CHOL 136 09/03/2018 08:04 AM   TRIG 97 09/03/2018 08:04 AM   HDL 50 09/03/2018 08:04 AM   CHOLHDL 2.7 09/03/2018 08:04 AM   LDLCALC 68 09/03/2018 08:04 AM    Wt Readings from Last 3 Encounters:  10/03/18 240 lb (108.9 kg)  07/04/18 240 lb (108.9 kg)  04/23/18 240 lb (108.9 kg)     Objective:    Vital Signs:  BP (!) 130/91   Pulse 91   Temp 98.1 F (36.7 C) (Temporal) Comment (Src): pt checked with her thermometer  Resp 12   Ht 5\' 8"  (1.727 m)   Wt 240 lb (108.9 kg)   BMI 36.49 kg/m    VITAL SIGNS:   reviewed GEN:  no acute distress RESPIRATORY:  no shortness of breath noted  PSYCH:  normal and affect are good   ASSESSMENT & PLAN:     1. Cough S&S of cough, without other COVID signs. Hx of bronchitis and coughs in past. Will treat cough.  Educated about signs of COVID and to call if she notices them. But to go to ED if breathing becomes hard.  Reviewed side effects, risks and benefits of medication.   Patient acknowledged agreement and understanding of the plan.    - promethazine-dextromethorphan (PROMETHAZINE-DM) 6.25-15 MG/5ML syrup; One teaspoon daily , at bedtime,  as needed for excessive cough  Dispense: 180 mL; Refill: 0 - benzonatate (TESSALON PERLES) 100 MG capsule; Take 1 capsule (100 mg total) by mouth 3 (three) times daily as needed for cough.  Dispense: 30 capsule; Refill: 0   Time:   Today, I have spent 10 minutes with the patient with telehealth technology discussing the above problems.     Medication Adjustments/Labs and Tests Ordered: Current medicines are reviewed at length with the patient today.  Concerns regarding medicines are outlined above.   Tests Ordered: No orders of the defined types were placed in this encounter.   Medication Changes: Meds ordered this encounter  Medications  . promethazine-dextromethorphan (PROMETHAZINE-DM) 6.25-15 MG/5ML syrup    Sig: One teaspoon daily , at bedtime, as needed for excessive cough    Dispense:  180 mL    Refill:  0    Order Specific Question:   Supervising Provider    Answer:   SIMPSON, MARGARET E [[id number]]  . benzonatate (TESSALON PERLES) 100 MG capsule    Sig: Take 1 capsule (100 mg total) by mouth 3 (three) times daily as needed for cough.    Dispense:  30 capsule    Refill:  0    Order Specific Question:   Supervising Provider    Answer:   Fayrene Helper [[id number]]    Disposition:  Follow up prn  Signed, Perlie Mayo, NP  10/03/2018 10:43 AM     [place]

## 2018-10-16 ENCOUNTER — Other Ambulatory Visit (HOSPITAL_COMMUNITY): Payer: Self-pay | Admitting: Psychiatry

## 2018-10-21 ENCOUNTER — Other Ambulatory Visit (HOSPITAL_COMMUNITY): Payer: Self-pay | Admitting: Psychiatry

## 2018-10-24 ENCOUNTER — Encounter (HOSPITAL_COMMUNITY): Payer: Self-pay | Admitting: Psychiatry

## 2018-10-24 ENCOUNTER — Ambulatory Visit (INDEPENDENT_AMBULATORY_CARE_PROVIDER_SITE_OTHER): Payer: Medicare Other | Admitting: Psychiatry

## 2018-10-24 ENCOUNTER — Other Ambulatory Visit: Payer: Self-pay

## 2018-10-24 DIAGNOSIS — F411 Generalized anxiety disorder: Secondary | ICD-10-CM

## 2018-10-24 DIAGNOSIS — R110 Nausea: Secondary | ICD-10-CM | POA: Diagnosis not present

## 2018-10-24 DIAGNOSIS — R109 Unspecified abdominal pain: Secondary | ICD-10-CM | POA: Diagnosis not present

## 2018-10-24 MED ORDER — ALPRAZOLAM 1 MG PO TABS: 1.0000 mg | ORAL_TABLET | Freq: Three times a day (TID) | ORAL | 2 refills | Status: DC

## 2018-10-24 MED ORDER — DIAZEPAM 5 MG PO TABS: ORAL_TABLET | ORAL | 2 refills | Status: DC

## 2018-10-24 NOTE — Progress Notes (Signed)
Virtual Visit via Video Note  I connected with Jenna Woods on 29/92/42 at 11:00 AM EDT by a video enabled telemedicine application and verified that I am speaking with the correct person using two identifiers.   I discussed the limitations of evaluation and management by telemedicine and the availability of in person appointments. The patient expressed understanding and agreed to proceed.    I discussed the assessment and treatment plan with the patient. The patient was provided an opportunity to ask questions and all were answered. The patient agreed with the plan and demonstrated an understanding of the instructions.   The patient was advised to call back or seek an in-person evaluation if the symptoms worsen or if the condition fails to improve as anticipated.  I provided 15 minutes of non-face-to-face time during this encounter.   Levonne Spiller, MD  [facility] MD/PA/NP OP Progress Note  6/83/4196 22:29 AM Jenna Woods  MRN:  [id number]  Chief Complaint:  Chief Complaint    Depression; Anxiety     HPI: This patient is a 58 year old divorced black female who lives alone in [place].  She has 2 grown sons.  She is on disability.  The patient returns after 3 months for treatment of anxiety.  She states she is stressed because 1 of her sons who lives in [place] is basically kidnapped his 84-year-old daughter and brought her to [place].  She is worried that the law will try to get to them.  He does have a custody hearing with the mother of the child coming up in September.  She has been very worried about him as well as the granddaughter.  At times this is interrupted her sleep.  However she is come to the realization that she cannot do anything more about it.  Most of the time she is sleeping well with the Valium and the Xanax continues to help her anxiety.  She is having a lot of stomachaches due to a hiatal hernia and is going to contact her GI specialist. Visit Diagnosis:     ICD-10-CM   1. Anxiety state  F41.1     Past Psychiatric History:none  Past Medical History:  Past Medical History:  Diagnosis Date  . Allergic reaction   . Allergy    perrenial   . Anxiety   . Arthritis   . Depression    h/o suicidal ideation in 2011  . Depression with anxiety   . Diverticulosis of colon 12/19/2015  . Diverticulosis of colon with hemorrhage 12/19/2015  . GERD (gastroesophageal reflux disease)   . Hypertension 1996  . Obesity   . Peripheral vascular disease ([place])   . Prediabetes 2013  . Scleroderma ([place]) 1998  . Systemic lupus erythematosus ([place]) 1998   Treated at [facility]  . Tobacco abuse, in remission    10-pack-years discontinued in 2007  . Urinary frequency     Past Surgical History:  Procedure Laterality Date  . ABDOMINAL HYSTERECTOMY  1990   Neoplasm  . CESAREAN SECTION     x2  . COLONOSCOPY  2005  . COLONOSCOPY WITH PROPOFOL N/A 08/28/2015   Dr. Laural Golden: scattered medium-mouth diverticula entire colon, external hemorrhoids  . COLONOSCOPY WITH PROPOFOL N/A 07/27/2016   Procedure: COLONOSCOPY WITH PROPOFOL;  Surgeon: Rogene Houston, MD;  [facility];  Service: Endoscopy;  Laterality: N/A;  . FINGER AMPUTATION     Third finger, bilateral, distal  . NASAL SINUS SURGERY  09/06/2011   Procedure: ENDOSCOPIC SINUS SURGERY;  Surgeon: Ascencion Dike, MD;  Location: [place];  Service: ENT;  Laterality: N/A;  Nasal cerebrospinal fluid leak repair with Left temporalis fascia graft and Left Ear cartilage graft  . VASCULAR SURGERY     Hands, bilaterally, [facility]; right hand partial amputation of middle finger.    Family Psychiatric History: see below  Family History:  Family History  Problem Relation Age of Onset  . Arthritis Mother        Rheumatoid  . Dementia Mother   . Hypertension Mother   . Cirrhosis Father   . Alcohol abuse Father   . Arthritis Maternal Grandmother   . Drug abuse Sister   . Scleroderma  Brother   . Graves' disease Son   . Colon cancer Neg Hx     Social History:  Social History   Socioeconomic History  . Marital status: Single    Spouse name: Not on file  . Number of children: 2  . Years of education: 12  . Highest education level: 12th grade  Occupational History  . Occupation: Art therapist: [employer]    Comment: Disability awarded in [place]  . Financial resource strain: Hard  . Food insecurity    Worry: Never true    Inability: Never true  . Transportation needs    Medical: No    Non-medical: No  Tobacco Use  . Smoking status: Former Smoker    Packs/day: 0.25    Years: 25.00    Pack years: 6.25    Types: Cigarettes    Quit date: 04/07/2001    Years since quitting: 17.5  . Smokeless tobacco: Never Used  . Tobacco comment: quit 88 days ago!!  Substance and Sexual Activity  . Alcohol use: No    Alcohol/week: 0.0 standard drinks    Comment: quit in 2004  . Drug use: No    Comment: use to use pot   . Sexual activity: Not Currently    Birth control/protection: Surgical  Lifestyle  . Physical activity    Days per week: 3 days    Minutes per session: 50 min  . Stress: Rather much  Relationships  . Social Herbalist on phone: More than three times a week    Gets together: Patient refused    Attends religious service: More than 4 times per year    Active member of club or organization: Yes    Attends meetings of clubs or organizations: More than 4 times per year    Relationship status: Separated  Other Topics Concern  . Not on file  Social History Narrative   Currently living with son in [place], moving back soon     Allergies:  Allergies  Allergen Reactions  . Bee Venom Anaphylaxis  . Sesame Oil Anaphylaxis and Swelling    (Sesame seed)  . Aspirin     Diverticulosis with bleed  . Molds & Smuts   . Codeine Itching and Rash    Metabolic Disorder Labs: Lab Results  Component Value Date    HGBA1C 6.0 (H) 09/03/2018   MPG 126 09/03/2018   MPG 128 01/29/2018   No results found for: PROLACTIN Lab Results  Component Value Date   CHOL 136 09/03/2018   TRIG 97 09/03/2018   HDL 50 09/03/2018   CHOLHDL 2.7 09/03/2018   VLDL 15 06/30/2016   LDLCALC 68 09/03/2018   LDLCALC 66 08/28/2017   Lab Results  Component  Value Date   TSH 0.64 01/29/2018   TSH 1.10 12/13/2016    Therapeutic Level Labs: No results found for: LITHIUM No results found for: VALPROATE No components found for:  CBMZ  Current Medications: Current Outpatient Medications  Medication Sig Dispense Refill  . ALPRAZolam (XANAX) 1 MG tablet Take 1 tablet (1 mg total) by mouth 3 (three) times daily. 90 tablet 2  . amLODipine (NORVASC) 10 MG tablet TAKE 1 TABLET BY MOUTH DAILY 90 tablet 1  . benzonatate (TESSALON PERLES) 100 MG capsule Take 1 capsule (100 mg total) by mouth 3 (three) times daily as needed for cough. 30 capsule 0  . Calcium Carbonate-Vit D-Min (CALCIUM 1200) 1200-1000 MG-UNIT CHEW Chew 1 tablet by mouth daily.    . Cholecalciferol (VITAMIN D) 2000 units CAPS Omne daily 30 capsule   . diazepam (VALIUM) 5 MG tablet TAKE 1 TABLET(5 MG) BY MOUTH AT BEDTIME 30 tablet 2  . ferrous sulfate (CVS IRON) 325 (65 FE) MG tablet Take 1 tablet (325 mg total) by mouth 2 (two) times daily with a meal. 60 tablet 3  . montelukast (SINGULAIR) 10 MG tablet Take 1 tablet (10 mg total) by mouth at bedtime. 90 tablet 3  . MYRBETRIQ 25 MG TB24 tablet TAKE 1 TABLET BY MOUTH DAILY 30 tablet 5  . pantoprazole (PROTONIX) 20 MG tablet TAKE 1 TABLET(20 MG) BY MOUTH DAILY 90 tablet 1  . promethazine-dextromethorphan (PROMETHAZINE-DM) 6.25-15 MG/5ML syrup One teaspoon daily , at bedtime, as needed for excessive cough 180 mL 0   No current facility-administered medications for this visit.      Musculoskeletal: Strength & Muscle Tone: within normal limits Gait & Station: normal Patient leans: N/A  Psychiatric Specialty  Exam: Review of Systems  Gastrointestinal: Positive for abdominal pain and nausea.  Psychiatric/Behavioral: The patient is nervous/anxious.   All other systems reviewed and are negative.   There were no vitals taken for this visit.There is no height or weight on file to calculate BMI.  General Appearance: Casual and Fairly Groomed  Eye Contact:  Good  Speech:  Clear and Coherent  Volume:  Normal  Mood:  Anxious  Affect:  Constricted  Thought Process:  Goal Directed  Orientation:  Full (Time, Place, and Person)  Thought Content: Rumination   Suicidal Thoughts:  No  Homicidal Thoughts:  No  Memory:  Immediate;   Good Recent;   Good Remote;   Good  Judgement:  Fair  Insight:  Fair  Psychomotor Activity:  Normal  Concentration:  Concentration: Fair and Attention Span: Fair  Recall:  Good  Fund of Knowledge: Good  Language: Good  Akathisia:  No  Handed:  Right  AIMS (if indicated): not done  Assets:  Communication Skills Desire for Improvement Resilience Social Support Talents/Skills  ADL's:  Intact  Cognition: WNL  Sleep:  Poor   Screenings: GAD-7     Virtual BH Phone Follow Up from 05/25/2017 in [place]  Total GAD-7 Score  13    PHQ2-9     Office Visit from 10/03/2018 in [facility] Office Visit from 07/04/2018 in [facility] from 04/23/2018 in [facility] Office Visit from 01/29/2018 in [facility] Office Visit from 08/31/2017 in [facility]  PHQ-2 Total Score  3  1  3   0  2  PHQ-9 Total Score  3  2  17   -  7       Assessment and Plan: This patient  is a 58 year old female with a history of anxiety.  She is very anxious right now about her son situation and I explained her there is nothing more she could do about this.  She voices understanding.  She will continue Xanax 1 mg up to 3 times daily for anxiety and Valium 5 mg only at bedtime.  She will return to see me in 3  months   Levonne Spiller, MD 10/24/2018, 11:23 AM

## 2018-11-01 ENCOUNTER — Encounter (INDEPENDENT_AMBULATORY_CARE_PROVIDER_SITE_OTHER): Payer: Self-pay | Admitting: Nurse Practitioner

## 2018-11-01 ENCOUNTER — Encounter (INDEPENDENT_AMBULATORY_CARE_PROVIDER_SITE_OTHER): Payer: Self-pay | Admitting: *Deleted

## 2018-11-01 ENCOUNTER — Ambulatory Visit (INDEPENDENT_AMBULATORY_CARE_PROVIDER_SITE_OTHER): Payer: Medicare Other | Admitting: Nurse Practitioner

## 2018-11-01 ENCOUNTER — Other Ambulatory Visit: Payer: Self-pay

## 2018-11-01 VITALS — BP 108/80 | HR 88 | Temp 98.3°F | Resp 18 | Ht 66.0 in | Wt 248.5 lb

## 2018-11-01 DIAGNOSIS — K219 Gastro-esophageal reflux disease without esophagitis: Secondary | ICD-10-CM

## 2018-11-01 DIAGNOSIS — R110 Nausea: Secondary | ICD-10-CM | POA: Diagnosis not present

## 2018-11-01 DIAGNOSIS — K429 Umbilical hernia without obstruction or gangrene: Secondary | ICD-10-CM | POA: Insufficient documentation

## 2018-11-01 MED ORDER — ONDANSETRON HCL 4 MG PO TABS: 4.0000 mg | ORAL_TABLET | Freq: Three times a day (TID) | ORAL | 1 refills | Status: DC | PRN

## 2018-11-01 NOTE — Progress Notes (Signed)
Subjective:    Patient ID: Jenna Woods, female    DOB: 02/19/1961, 58 y.o.   MRN: [id number]  HPI Adja Dadisman. Wurth is a 58 y.o. female with a past medical history of lupus, anxiety, depression, GERD, particular bleed in 2017 and 2018.  She presents today with complaints of nausea which started approximately 2 months ago.  She has intermittent vague upper abdominal pain.  She is primarily concerned regarding the nausea.  Her food intake has decreased.  She has lost about 7- 8 pounds over the past 2 months.  No fever, sweats or chills.  She complains of central abdominal pain and questions if she has a hernia around her navel.  She also has lower abdominal discomfort at times which comes and goes, sometimes feels like "a Charlie horse". No specific abdominal pain today.  She is passing a normal formed darker green stool once daily.  She reports having darker green stools since starting ferrous sulfate following her diverticular bleed in 2017.  She denies having melena.  She was having more frequent heartburn, however, the past 3 days she not experienced any heartburn.  No dysphasia.  She is taking pantoprazole 20 mg once daily.  She denies taking any aspirin or NSAIDs.  She denies ever having an EGD.  She complains of having overactive bladder.  Her most recent colonoscopy was done 07/27/2016 while in the hospital due to having a diverticular bleed. The procedure was aborted due to poor bowel prep because of presence of large clots in the rectum and sigmoid colon as well as stool- Diverticulosis in the sigmoid colon.  A colonoscopy done 08/28/2015 was done in the hospital at the time of her first diverticular bleed which showed diverticulosis in the entire colon, external hemorrhoids, no evidence of active bleeding, the preparation was fair.  Her recall colonoscopy in 5 years was recommended. Past Medical History:  Diagnosis Date  . Allergic reaction   . Allergy    perrenial   . Anxiety   .  Arthritis   . Depression    h/o suicidal ideation in 2011  . Depression with anxiety   . Diverticulosis of colon 12/19/2015  . Diverticulosis of colon with hemorrhage 12/19/2015  . GERD (gastroesophageal reflux disease)   . Hypertension 1996  . Obesity   . Peripheral vascular disease ([place])   . Prediabetes 2013  . Scleroderma ([place]) 1998  . Systemic lupus erythematosus ([place]) 1998   Treated at [facility]  . Tobacco abuse, in remission    10-pack-years discontinued in 2007  . Urinary frequency    Past Surgical History:  Procedure Laterality Date  . ABDOMINAL HYSTERECTOMY  1990   Neoplasm  . CESAREAN SECTION     x2  . COLONOSCOPY  2005  . COLONOSCOPY WITH PROPOFOL N/A 08/28/2015   Dr. Laural Golden: scattered medium-mouth diverticula entire colon, external hemorrhoids  . COLONOSCOPY WITH PROPOFOL N/A 07/27/2016   Procedure: COLONOSCOPY WITH PROPOFOL;  Surgeon: Rogene Houston, MD;  [facility];  Service: Endoscopy;  Laterality: N/A;  . FINGER AMPUTATION     Third finger, bilateral, distal  . NASAL SINUS SURGERY  09/06/2011   Procedure: ENDOSCOPIC SINUS SURGERY;  Surgeon: Ascencion Dike, MD;  Location: [place];  Service: ENT;  Laterality: N/A;  Nasal cerebrospinal fluid leak repair with Left temporalis fascia graft and Left Ear cartilage graft  . VASCULAR SURGERY     Hands, bilaterally, [facility]; right hand partial amputation of middle finger.  Current Outpatient Medications on File Prior to Visit  Medication Sig Dispense Refill  . ALPRAZolam (XANAX) 1 MG tablet Take 1 tablet (1 mg total) by mouth 3 (three) times daily. 90 tablet 2  . amLODipine (NORVASC) 10 MG tablet TAKE 1 TABLET BY MOUTH DAILY 90 tablet 1  . Calcium Carbonate-Vit D-Min (CALCIUM 1200) 1200-1000 MG-UNIT CHEW Chew 1 tablet by mouth daily.    . Cholecalciferol (VITAMIN D) 2000 units CAPS Omne daily 30 capsule   . diazepam (VALIUM) 5 MG tablet TAKE 1 TABLET(5 MG) BY MOUTH AT BEDTIME 30 tablet 2   . ferrous sulfate (CVS IRON) 325 (65 FE) MG tablet Take 325 mg by mouth 1 day or 1 dose. 60 tablet 3  . MYRBETRIQ 25 MG TB24 tablet TAKE 1 TABLET BY MOUTH DAILY 30 tablet 5  . pantoprazole (PROTONIX) 20 MG tablet TAKE 1 TABLET(20 MG) BY MOUTH DAILY 90 tablet 1   No current facility-administered medications on file prior to visit.    Allergies  Allergen Reactions  . Bee Venom Anaphylaxis  . Sesame Oil Anaphylaxis and Swelling    (Sesame seed)  . Aspirin     Diverticulosis with bleed  . Molds & Smuts   . Codeine Itching and Rash      Objective:   Physical Exam  BP 108/80   Pulse 88   Temp 98.3 F (36.8 C) (Oral)   Resp 18   Ht 5\' 6"  (1.676 m)   Wt 248 lb 8 oz (112.7 kg)   BMI 40.11 kg/m   General: 58 year old female well-developed in no acute distress Eyes: Sclera nonicteric, conjunctiva pink Mouth: Dentition intact, no ulcers or lesions Neck: Supple, no lymphadenopathy or thyromegaly Heart: Regular rate and rhythm, no murmurs Lungs: Clear throughout Abdomen: Obese abdomen, umbilical hernia with mild surrounding tenderness, epigastric and left upper quadrant tenderness without rebound or guarding, minimal central lower abdominal tenderness, positive bowel sounds to all 4 quadrants, small umbilical scar and central lower abdominal scar intact Rectal: Deferred Extremities: No edema Neuro: Alert and oriented x4, no focal deficits     Assessment & Plan:   1.  Nausea -Ondansetron 4 mg 1 tab p.o. every 8 hours as needed nausea -Abdominal ultrasound -CBC, CMP, lipase and CRP  2.  History of GERD with epigastric and left upper quadrant abdominal pain -Increase pantoprazole 20 mg 1 p.o. twice daily -EGD if symptoms persist or worsen  3.  Intermittent lower abdominal pain.  No lower abdominal pain at this time. -Continue to monitor lower abdominal pain, if persists will pursue an abdominal/pelvic CAT scan -Await the above lab results as ordered above  4.  Umbilical  hernia -No immediate need for surgical intervention, however, would recommend consult with a general surgeon in the near future if her umbilical discomfort continues  Further follow-up to be determined after the above ultrasound lab results received, follow-up in the office in 2 months

## 2018-11-01 NOTE — Patient Instructions (Signed)
1. Complete the ordered blood tests  2. Schedule an abdominal ultrasound   3. Increase Protonix 20mg  one capsule to be taken 30 minutes before breakfast and 1 capsule 30 minutes before dinner  4. Ondansetron 4mg  one tab by mouth every 8 hours as needed for nausea  5. Follow up in office 2 months

## 2018-11-02 LAB — COMPLETE METABOLIC PANEL WITH GFR
AG Ratio: 1.5 (calc) (ref 1.0–2.5)
ALT: 13 U/L (ref 6–29)
AST: 13 U/L (ref 10–35)
Albumin: 4 g/dL (ref 3.6–5.1)
Alkaline phosphatase (APISO): 70 U/L (ref 37–153)
BUN: 11 mg/dL (ref 7–25)
CO2: 26 mmol/L (ref 20–32)
Calcium: 9.3 mg/dL (ref 8.6–10.4)
Chloride: 108 mmol/L (ref 98–110)
Creat: 0.72 mg/dL (ref 0.50–0.99)
GFR, Est African American: 105 mL/min/{1.73_m2} (ref >=60)
GFR, Est Non African American: 91 mL/min/{1.73_m2} (ref >=60)
Globulin: 2.6 g/dL (calc) (ref 1.9–3.7)
Glucose, Bld: 104 mg/dL (ref 65–139)
Potassium: 4.1 mmol/L (ref 3.5–5.3)
Sodium: 140 mmol/L (ref 135–146)
Total Bilirubin: 0.4 mg/dL (ref 0.2–1.2)
Total Protein: 6.6 g/dL (ref 6.1–8.1)

## 2018-11-02 LAB — CBC WITH DIFFERENTIAL/PLATELET
Absolute Monocytes: 557 cells/uL (ref 200–950)
Basophils Absolute: 61 cells/uL (ref 0–200)
Basophils Relative: 0.7 %
Eosinophils Absolute: 244 cells/uL (ref 15–500)
Eosinophils Relative: 2.8 %
HCT: 38.7 % (ref 35.0–45.0)
Hemoglobin: 13 g/dL (ref 11.7–15.5)
Lymphs Abs: 1470 cells/uL (ref 850–3900)
MCH: 30 pg (ref 27.0–33.0)
MCHC: 33.6 g/dL (ref 32.0–36.0)
MCV: 89.4 fL (ref 80.0–100.0)
MPV: 9.9 fL (ref 7.5–12.5)
Monocytes Relative: 6.4 %
Neutro Abs: 6368 cells/uL (ref 1500–7800)
Neutrophils Relative %: 73.2 %
Platelets: 367 10*3/uL (ref 140–400)
RBC: 4.33 10*6/uL (ref 3.80–5.10)
RDW: 11.9 % (ref 11.0–15.0)
Total Lymphocyte: 16.9 %
WBC: 8.7 10*3/uL (ref 3.8–10.8)

## 2018-11-02 LAB — LIPASE: Lipase: 13 U/L (ref 7–60)

## 2018-11-02 LAB — C-REACTIVE PROTEIN: CRP: 4.3 mg/L (ref <8.0)

## 2018-11-07 ENCOUNTER — Other Ambulatory Visit: Payer: Self-pay

## 2018-11-07 ENCOUNTER — Ambulatory Visit (HOSPITAL_COMMUNITY)
Admission: RE | Admit: 2018-11-07 | Discharge: 2018-11-07 | Disposition: A | Discharge status: Home / Self Care | Payer: Medicare Other | Source: Ambulatory Visit | Attending: Nurse Practitioner | Admitting: Nurse Practitioner

## 2018-11-07 DIAGNOSIS — R110 Nausea: Secondary | ICD-10-CM | POA: Diagnosis not present

## 2018-11-07 DIAGNOSIS — K429 Umbilical hernia without obstruction or gangrene: Secondary | ICD-10-CM | POA: Insufficient documentation

## 2018-11-07 DIAGNOSIS — K219 Gastro-esophageal reflux disease without esophagitis: Secondary | ICD-10-CM | POA: Diagnosis not present

## 2018-11-07 DIAGNOSIS — R109 Unspecified abdominal pain: Secondary | ICD-10-CM | POA: Diagnosis not present

## 2018-11-20 ENCOUNTER — Other Ambulatory Visit (HOSPITAL_COMMUNITY): Payer: Self-pay | Admitting: Psychiatry

## 2018-11-27 ENCOUNTER — Telehealth (INDEPENDENT_AMBULATORY_CARE_PROVIDER_SITE_OTHER): Payer: Self-pay | Admitting: Nurse Practitioner

## 2018-11-27 NOTE — Telephone Encounter (Signed)
Patient left message regarding test results  -  Please call her at [phone]

## 2018-11-28 NOTE — Telephone Encounter (Signed)
I called patient, she received my prior message regarding normal labs. Her abd sono showed possible mild fatty liver, I advised eating healthy, lose 5 to 7 lbs and have her lfts repeated annually at the time of her physical with her pcp. She has an umbilical hernia that she wants fixed. She will contact her pcp for a referral to see a general surgeon.

## 2018-11-29 ENCOUNTER — Ambulatory Visit: Payer: Medicare Other | Admitting: Family Medicine

## 2018-12-11 ENCOUNTER — Ambulatory Visit (INDEPENDENT_AMBULATORY_CARE_PROVIDER_SITE_OTHER): Payer: Medicare Other | Admitting: Family Medicine

## 2018-12-11 ENCOUNTER — Encounter: Payer: Self-pay | Admitting: Family Medicine

## 2018-12-11 ENCOUNTER — Other Ambulatory Visit: Payer: Self-pay

## 2018-12-11 VITALS — BP 124/88 | HR 80 | Temp 97.8°F | Resp 15 | Ht 66.0 in | Wt 247.0 lb

## 2018-12-11 DIAGNOSIS — I10 Essential (primary) hypertension: Secondary | ICD-10-CM

## 2018-12-11 DIAGNOSIS — K429 Umbilical hernia without obstruction or gangrene: Secondary | ICD-10-CM | POA: Diagnosis not present

## 2018-12-11 DIAGNOSIS — F418 Other specified anxiety disorders: Secondary | ICD-10-CM | POA: Diagnosis not present

## 2018-12-11 MED ORDER — AMLODIPINE BESYLATE 10 MG PO TABS: 10.0000 mg | ORAL_TABLET | Freq: Every day | ORAL | 1 refills | Status: DC

## 2018-12-11 MED ORDER — PANTOPRAZOLE SODIUM 20 MG PO TBEC: 20.0000 mg | DELAYED_RELEASE_TABLET | Freq: Two times a day (BID) | ORAL | 4 refills | Status: DC

## 2018-12-11 MED ORDER — PANTOPRAZOLE SODIUM 40 MG PO TBEC: 40.0000 mg | DELAYED_RELEASE_TABLET | Freq: Every day | ORAL | 1 refills | Status: DC

## 2018-12-11 NOTE — Assessment & Plan Note (Addendum)
Painful, intermittently, refer to Gen surg, not incarcrated, however  Pt reports discomfort to the extent that she wants repair

## 2018-12-11 NOTE — Patient Instructions (Addendum)
Keep appointment for physical exam as before, call if you need me sooner   You are referred to surgery re hernia  New higher dose of protonix is prescribed, protonix 40 mg one daily , effective 11/01/2018  [place] of abdomen just shows probable mild fatty liver  It is important that you exercise regularly at least 30 minutes 5 times a week. If you develop chest pain, have severe difficulty breathing, or feel very tired, stop exercising immediately and seek medical attention    Think about what you will eat, plan ahead. Choose " clean, green, fresh or frozen" over canned, processed or packaged foods which are more sugary, salty and fatty. 70 to 75% of food eaten should be vegetables and fruit. Three meals at set times with snacks allowed between meals, but they must be fruit or vegetables. Aim to eat over a 12 hour period , example 7 am to 7 pm, and STOP after  your last meal of the day. Drink water,generally about 64 ounces per day, no other drink is as healthy. Fruit juice is best enjoyed in a healthy way, by EATING the fruit. [place]11

## 2018-12-12 ENCOUNTER — Encounter: Payer: Self-pay | Admitting: Family Medicine

## 2018-12-12 NOTE — Assessment & Plan Note (Signed)
Controlled, no change in medication DASH diet and commitment to daily physical activity for a minimum of 30 minutes discussed and encouraged, as a part of hypertension management. The importance of attaining a healthy weight is also discussed.  BP/Weight 12/11/2018 11/01/2018 10/03/2018 07/04/2018 04/23/2018 01/29/2018 [id number]  Systolic BP [id number]  Diastolic BP 88 80 91 82 82 84 80  Wt. (Lbs) 247 248.5 240 240 240 243 240  BMI 39.87 40.11 36.49 37.59 37.59 38.06 37.59  Some encounter information is confidential and restricted. Go to Review Flowsheets activity to see all data.

## 2018-12-12 NOTE — Assessment & Plan Note (Signed)
Obesity linked with hypertension and depression  Patient re-educated about  the importance of commitment to a  minimum of 150 minutes of exercise per week as able.  The importance of healthy food choices with portion control discussed, as well as eating regularly and within a 12 hour window most days. The need to choose "clean , green" food 50 to 75% of the time is discussed, as well as to make water the primary drink and set a goal of 64 ounces water daily.    Weight /BMI 12/11/2018 11/01/2018 10/03/2018  WEIGHT 247 lb 248 lb 8 oz 240 lb  HEIGHT 5\' 6"  5\' 6"  5\' 8"   BMI 39.87 kg/m2 40.11 kg/m2 36.49 kg/m2  Some encounter information is confidential and restricted. Go to Review Flowsheets activity to see all data.

## 2018-12-12 NOTE — Assessment & Plan Note (Signed)
Managed by psych and controlled on current medication

## 2018-12-12 NOTE — Progress Notes (Signed)
   Jenna Woods     MRN: [id number]      DOB: [birth year]   HPI Jenna Woods is here with a primary concern re painful umbilical hernia for years, pain is worsening , she wants surgery. Wants to review recent US of abdomen ordere by GI also Has had flu vaccine Denies uncontrolled depression or anxiety. Plans to work on weight loss  ROS Denies recent fever or chills. Denies sinus pressure, nasal congestion, ear pain or sore throat. Denies chest congestion, productive cough or wheezing. Denies chest pains, palpitations and leg swelling Denies joint pain, swelling and limitation in mobility. Denies headaches, seizures, numbness, or tingling. Denies depression, anxiety or insomnia. Denies skin break down or rash.   PE  BP 124/88   Pulse 80   Temp 97.8 F (36.6 C) (Temporal)   Resp 15   Ht 5\' 6"  (1.676 m)   Wt 247 lb (112 kg)   BMI 39.87 kg/m   Patient alert and oriented and in no cardiopulmonary distress.  HEENT: No facial asymmetry, EOMI,   o Neck supple no JVD,   Chest: Clear to auscultation bilaterally.  CVS: S1, S2 no murmurs, no S3.Regular rate.  ABD: umbilical hernia, tender , to palpation, reducible , no erythema or drainage from umbilicus  Ext: No edema  MS: Adequate ROM spine, shoulders, hips and knees.  Skin: Intact, no ulcerations or rash noted.  Psych: Good eye contact, normal affect. Memory intact not anxious or depressed appearing.     Assessment & Plan Umbilical hernia Painful, intermittently, refer to Gen surg, not incarcrated, however  Pt reports discomfort to the extent that she wants repair  Hypertension Controlled, no change in medication DASH diet and commitment to daily physical activity for a minimum of 30 minutes discussed and encouraged, as a part of hypertension management. The importance of attaining a healthy weight is also discussed.  BP/Weight 12/11/2018 11/01/2018 10/03/2018 07/04/2018 04/23/2018 01/29/2018 [id number]  Systolic  BP [id number]  Diastolic BP 88 80 91 82 82 84 80  Wt. (Lbs) 247 248.5 240 240 240 243 240  BMI 39.87 40.11 36.49 37.59 37.59 38.06 37.59  Some encounter information is confidential and restricted. Go to Review Flowsheets activity to see all data.       Depression with anxiety Managed by psych and controlled on current medication  Morbid obesity ([place]) Obesity linked with hypertension and depression  Patient re-educated about  the importance of commitment to a  minimum of 150 minutes of exercise per week as able.  The importance of healthy food choices with portion control discussed, as well as eating regularly and within a 12 hour window most days. The need to choose "clean , green" food 50 to 75% of the time is discussed, as well as to make water the primary drink and set a goal of 64 ounces water daily.    Weight /BMI 12/11/2018 11/01/2018 10/03/2018  WEIGHT 247 lb 248 lb 8 oz 240 lb  HEIGHT 5\' 6"  5\' 6"  5\' 8"   BMI 39.87 kg/m2 40.11 kg/m2 36.49 kg/m2  Some encounter information is confidential and restricted. Go to Review Flowsheets activity to see all data.

## 2018-12-17 DIAGNOSIS — M545 Low back pain: Secondary | ICD-10-CM | POA: Diagnosis not present

## 2018-12-17 DIAGNOSIS — R0781 Pleurodynia: Secondary | ICD-10-CM | POA: Diagnosis not present

## 2018-12-20 ENCOUNTER — Ambulatory Visit: Payer: Medicare Other | Admitting: General Surgery

## 2019-01-01 ENCOUNTER — Ambulatory Visit (INDEPENDENT_AMBULATORY_CARE_PROVIDER_SITE_OTHER): Payer: Medicare Other | Admitting: Nurse Practitioner

## 2019-01-03 ENCOUNTER — Encounter: Payer: Self-pay | Admitting: General Surgery

## 2019-01-03 ENCOUNTER — Other Ambulatory Visit: Payer: Self-pay

## 2019-01-03 ENCOUNTER — Encounter: Payer: Medicare Other | Admitting: Family Medicine

## 2019-01-03 ENCOUNTER — Ambulatory Visit (INDEPENDENT_AMBULATORY_CARE_PROVIDER_SITE_OTHER): Payer: Medicare Other | Admitting: General Surgery

## 2019-01-03 VITALS — BP 104/78 | HR 98 | Temp 98.6°F | Resp 16 | Ht 66.0 in | Wt 249.0 lb

## 2019-01-03 DIAGNOSIS — K432 Incisional hernia without obstruction or gangrene: Secondary | ICD-10-CM | POA: Diagnosis not present

## 2019-01-03 MED ORDER — MIRABEGRON ER 25 MG PO TB24: 25.0000 mg | ORAL_TABLET | Freq: Every day | ORAL | 5 refills | Status: DC

## 2019-01-03 NOTE — Patient Instructions (Signed)
Ventral Hernia  A ventral hernia is a bulge of tissue from inside the abdomen that pushes through a weak area of the muscles that form the front wall of the abdomen. The tissues inside the abdomen are inside a sac (peritoneum). These tissues include the small intestine, large intestine, and the fatty tissue that covers the intestines (omentum). Sometimes, the bulge that forms a hernia contains intestines. Other hernias contain only fat. Ventral hernias do not go away without surgical treatment. There are several types of ventral hernias. You may have:  A hernia at an incision site from previous abdominal surgery (incisional hernia).  A hernia just above the belly button (epigastric hernia), or at the belly button (umbilical hernia). These types of hernias can develop from heavy lifting or straining.  A hernia that comes and goes (reducible hernia). It may be visible only when you lift or strain. This type of hernia can be pushed back into the abdomen (reduced).  A hernia that traps abdominal tissue inside the hernia (incarcerated hernia). This type of hernia does not reduce.  A hernia that cuts off blood flow to the tissues inside the hernia (strangulated hernia). The tissues can start to die if this happens. This is a very painful bulge that cannot be reduced. A strangulated hernia is a medical emergency. What are the causes? This condition is caused by abdominal tissue putting pressure on an area of weakness in the abdominal muscles. What increases the risk? The following factors may make you more likely to develop this condition:  Being female.  Being 60 or older.  Being overweight or obese.  Having had previous abdominal surgery, especially if there was an infection after surgery.  Having had an injury to the abdominal wall.  Having had several pregnancies.  Having a buildup of fluid inside the abdomen (ascites). What are the signs or symptoms? The only symptom of a ventral hernia  may be a painless bulge in the abdomen. A reducible hernia may be visible only when you strain, cough, or lift. Other symptoms may include:  Dull pain.  A feeling of pressure. Signs and symptoms of a strangulated hernia may include:  Increasing pain.  Nausea and vomiting.  Pain when pressing on the hernia.  The skin over the hernia turning red or purple.  Constipation.  Blood in the stool (feces). How is this diagnosed? This condition may be diagnosed based on:  Your symptoms.  Your medical history.  A physical exam. You may be asked to cough or strain while standing. These actions increase the pressure inside your abdomen and force the hernia through the opening in your muscles. Your health care provider may try to reduce the hernia by pressing on it.  Imaging studies, such as an ultrasound or CT scan. How is this treated? This condition is treated with surgery. If you have a strangulated hernia, surgery is done as soon as possible. If your hernia is small and not incarcerated, you may be asked to lose some weight before surgery. Follow these instructions at home:  Follow instructions from your health care provider about eating or drinking restrictions.  If you are overweight, your health care provider may recommend that you increase your activity level and eat a healthier diet.  Do not lift anything that is heavier than 10 lb (4.5 kg).  Return to your normal activities as told by your health care provider. Ask your health care provider what activities are safe for you. You may need to avoid activities   that increase pressure on your hernia.  Take over-the-counter and prescription medicines only as told by your health care provider.  Keep all follow-up visits as told by your health care provider. This is important. Contact a health care provider if:  Your hernia gets larger.  Your hernia becomes painful. Get help right away if:  Your hernia becomes increasingly  painful.  You have pain along with any of the following: ? Changes in skin color in the area of the hernia. ? Nausea. ? Vomiting. ? Fever. Summary  A ventral hernia is a bulge of tissue from inside the abdomen that pushes through a weak area of the muscles that form the front wall of the abdomen.  This condition is treated with surgery, which may be urgent depending on your hernia.  Do not lift anything that is heavier than 10 lb (4.5 kg), and follow activity instructions from your health care provider. This information is not intended to replace advice given to you by your health care provider. Make sure you discuss any questions you have with your health care provider. Document Released: 02/08/2012 Document Revised: 04/05/2017 Document Reviewed: 09/12/2016 Elsevier Patient Education  [place] Repair, Adult  Open hernia repair is a surgical procedure to fix a hernia. A hernia occurs when an internal organ or tissue pushes out through a weak spot in the abdominal wall muscles. Hernias commonly occur in the groin and around the navel. Most hernias tend to get worse over time. Often, surgery is done to prevent the hernia from becoming bigger, uncomfortable, or an emergency. Emergency surgery may be needed if abdominal contents get stuck in the opening (incarcerated hernia) or the blood supply gets cut off (strangulated hernia). In an open repair, an incision is made in the abdomen to perform the surgery. Tell a health care provider about:  Any allergies you have.  All medicines you are taking, including vitamins, herbs, eye drops, creams, and over-the-counter medicines.  Any problems you or family members have had with anesthetic medicines.  Any blood or bone disorders you have.  Any surgeries you have had.  Any medical conditions you have, including any recent cold or flu symptoms.  Whether you are pregnant or may be pregnant. What are the risks?  Generally, this is a safe procedure. However, problems may occur, including:  Long-lasting (chronic) pain.  Bleeding.  Infection.  Damage to the testicle. This can cause shrinking or swelling.  Damage to the bladder, blood vessels, intestine, or nerves near the hernia.  Trouble passing urine.  Allergic reactions to medicines.  Return of the hernia. Medicines  Ask your health care provider about: ? Changing or stopping your regular medicines. This is especially important if you are taking diabetes medicines or blood thinners. ? Taking medicines such as aspirin and ibuprofen. These medicines can thin your blood. Do not take these medicines before your procedure if your health care provider instructs you not to.  You may be given antibiotic medicine to help prevent infection. General instructions  You may have blood tests or imaging studies.  Ask your health care provider how your surgical site will be marked or identified.  If you smoke, do not smoke for at least 2 weeks before your procedure or for as long as told by your health care provider.  Let your health care provider know if you develop a cold or any infection before your surgery.  Plan to have someone take you home from the hospital  or clinic.  If you will be going home right after the procedure, plan to have someone with you for 24 hours. What happens during the procedure?  To reduce your risk of infection: ? Your health care team will wash or sanitize their hands. ? Your skin will be washed with soap. ? Hair may be removed from the surgical area.  An IV tube will be inserted into one of your veins.  You will be given one or more of the following: ? A medicine to help you relax (sedative). ? A medicine to numb the area (local anesthetic). ? A medicine to make you fall asleep (general anesthetic).  Your surgeon will make an incision over the hernia.  The tissues of the hernia will be moved back into place.   The edges of the hernia may be stitched together.  The opening in the abdominal muscles will be closed with stitches (sutures). Or, your surgeon will place a mesh patch made of manmade (synthetic) material over the opening.  The incision will be closed.  A bandage (dressing) may be placed over the incision. The procedure may vary among health care providers and hospitals. What happens after the procedure?  Your blood pressure, heart rate, breathing rate, and blood oxygen level will be monitored until the medicines you were given have worn off.  You may be given medicine for pain.  Do not drive for 24 hours if you received a sedative. This information is not intended to replace advice given to you by your health care provider. Make sure you discuss any questions you have with your health care provider. Document Released: 08/17/2000 Document Revised: 02/03/2017 Document Reviewed: 08/05/2015 Elsevier Patient Education  2020 [employer].

## 2019-01-03 NOTE — Progress Notes (Addendum)
[facility] History and Physical  Reason for Referral: Umbilical Hernia Referring Physician:  Fayrene Helper, MD   Chief Complaint    Umbilical [name] is a 58 y.o. female.  HPI: Jenna Woods is a 58 yo very sweet patient who comes in with complaints of pain and a bulge at her umbilicus that she has noticed for several years. She thinks she first recalls it around 2017 when she had a colonoscopy and they noted that she had a hernia at that time. She says that she has some intermittent sharp pain and denies it ever getting stuck out or hard. She says that she has no associated nausea/vomiting or changes in her bowel habits. She has been otherwise in her normal state of health but says that the pain has been occurring more often and the area has been getting larger. Nothing has made the pain better or changed it.  She has lupus and raynaud's listed in her medical history, and has seen rheumatology in the past. She last saw [facility] 2014, but she reports not seeing anyone since that time and not being on any medications.  She has lost the tip of her fingers from a trauma in the past but there was concern for Raynaud's.  Currently, it sounds like her autoimmune diagnosis are asymptomatic at this time.   Past Medical History:  Diagnosis Date  . Allergic reaction   . Allergy    perrenial   . Anxiety   . Arthritis   . Depression    h/o suicidal ideation in 2011  . Depression with anxiety   . Diverticulosis of colon 12/19/2015  . Diverticulosis of colon with hemorrhage 12/19/2015  . GERD (gastroesophageal reflux disease)   . Hypertension 1996  . Obesity   . Peripheral vascular disease ([place])   . Prediabetes 2013  . Raynauds syndrome   . Scleroderma ([place]) 1998  . Systemic lupus erythematosus ([place]) 1998   Treated at [facility]  . Tobacco abuse, in remission    10-pack-years discontinued in 2007  . Urinary frequency     Past  Surgical History:  Procedure Laterality Date  . ABDOMINAL HYSTERECTOMY  1990   Neoplasm  . CESAREAN SECTION     x2  . COLONOSCOPY  2005  . COLONOSCOPY WITH PROPOFOL N/A 08/28/2015   Dr. Laural Golden: scattered medium-mouth diverticula entire colon, external hemorrhoids  . COLONOSCOPY WITH PROPOFOL N/A 07/27/2016   Procedure: COLONOSCOPY WITH PROPOFOL;  Surgeon: Rogene Houston, MD;  Location[facility];  Service: Endoscopy;  Laterality: N/A;  . FINGER AMPUTATION     Third finger, bilateral, distal  . NASAL SINUS SURGERY  09/06/2011   Procedure: ENDOSCOPIC SINUS SURGERY;  Surgeon: Ascencion Dike, MD;  Location: [place];  Service: ENT;  Laterality: N/A;  Nasal cerebrospinal fluid leak repair with Left temporalis fascia graft and Left Ear cartilage graft  . VASCULAR SURGERY     Hands, bilaterally, [facility]; right hand partial amputation of middle finger.    Family History  Problem Relation Age of Onset  . Arthritis Mother        Rheumatoid  . Dementia Mother   . Hypertension Mother   . Cirrhosis Father   . Alcohol abuse Father   . Arthritis Maternal Grandmother   . Drug abuse Sister   . Scleroderma Brother   . Graves' disease Son   . Colon cancer Neg Hx  Social History   Tobacco Use  . Smoking status: Former Smoker    Packs/day: 0.25    Years: 25.00    Pack years: 6.25    Types: Cigarettes    Quit date: 04/07/2001    Years since quitting: 17.7  . Smokeless tobacco: Never Used  . Tobacco comment: quit 88 days ago!!  Substance Use Topics  . Alcohol use: No    Alcohol/week: 0.0 standard drinks    Comment: quit in 2004  . Drug use: No    Comment: use to use pot     Medications: I have reviewed the patient's current medications. Allergies as of 01/03/2019      Reactions   Bee Venom Anaphylaxis   Sesame Oil Anaphylaxis, Swelling   (Sesame seed)   Aspirin    Diverticulosis with bleed   Molds & Smuts    Codeine Itching, Rash      Medication  List       Accurate as of January 03, 2019  1:27 PM. If you have any questions, ask your nurse or doctor.        ALPRAZolam 1 MG tablet Commonly known as: XANAX Take 1 tablet (1 mg total) by mouth 3 (three) times daily.   amLODipine 10 MG tablet Commonly known as: NORVASC Take 1 tablet (10 mg total) by mouth daily.   Calcium 1200 1200-1000 MG-UNIT Chew Chew 1 tablet by mouth daily.   [employer] Iron 325 (65 FE) MG tablet Generic drug: ferrous sulfate Take 325 mg by mouth 1 day or 1 dose.   diazepam 5 MG tablet Commonly known as: VALIUM TAKE 1 TABLET(5 MG) BY MOUTH AT BEDTIME   mirabegron ER 25 MG Tb24 tablet Commonly known as: Myrbetriq Take 1 tablet (25 mg total) by mouth daily. What changed: how much to take Changed by: Philis Pique Wingate, CMA   ondansetron 4 MG tablet Commonly known as: ZOFRAN Take 1 tablet (4 mg total) by mouth every 8 (eight) hours as needed for nausea or vomiting.   pantoprazole 40 MG tablet Commonly known as: PROTONIX Take 1 tablet (40 mg total) by mouth daily.   Vitamin D 50 MCG (2000 UT) Caps Omne daily        ROS:  A comprehensive review of systems was negative except for: Gastrointestinal: positive for abdominal pain, nausea, reflux symptoms and vomiting Genitourinary: positive for frequency Musculoskeletal: positive for stiff joints Endocrine: positive for temperature intolerance and tired/ sluggish/ lupus and raynaud's  Allergic/Immunologic: positive for hay fever  Blood pressure 104/78, pulse 98, temperature 98.6 F (37 C), temperature source Oral, resp. rate 16, height 5\' 6"  (1.676 m), weight 249 lb (112.9 kg), SpO2 95 %. Physical Exam Vitals signs reviewed.  Constitutional:      Appearance: Normal appearance.  HENT:     Head: Normocephalic and atraumatic.     Nose: Nose normal.     Mouth/Throat:     Mouth: Mucous membranes are moist.  Eyes:     Extraocular Movements: Extraocular movements intact.     Pupils: Pupils are equal,  round, and reactive to light.  Neck:     Musculoskeletal: Normal range of motion. No neck rigidity.  Cardiovascular:     Rate and Rhythm: Normal rate and regular rhythm.  Pulmonary:     Effort: Pulmonary effort is normal.     Breath sounds: Normal breath sounds.  Abdominal:     General: There is no distension.     Palpations: Abdomen is soft.  Tenderness: There is no abdominal tenderness.     Hernia: A hernia is present.     Comments: Periumbilical hernia, prior midline incisions from hysterectomy and C section she reports  Neurological:     Mental Status: She is alert.     Results: Personally reviewed CT 2009- small defect around the umbilicus noted   Assessment & Plan:  Jenna Woods is a 58 y.o. female with an incisional hernia that I see on her CT from 2009, so it has definitely been there for several years. It is now causing her discomfort and she wants to get it repaired. She is not on any medications for her autoimmune diseases at this time.  -Incisional hernia repair with mesh as she has had prior midline incisions in this area  -Discussed risk of bleeding, infection, use of mesh, risk of recurrence, injury to other organs -Discussed need for COVID testing and isolation  -Discussed post operative lifting and activity requirements   All questions were answered to the satisfaction of the patient.   Virl Cagey 01/03/2019, 1:27 PM

## 2019-01-04 ENCOUNTER — Encounter (HOSPITAL_COMMUNITY): Payer: Self-pay

## 2019-01-04 ENCOUNTER — Encounter (HOSPITAL_COMMUNITY)
Admission: RE | Admit: 2019-01-04 | Discharge: 2019-01-04 | Disposition: A | Discharge status: Home / Self Care | Payer: Medicare Other | Source: Ambulatory Visit | Attending: General Surgery | Admitting: General Surgery

## 2019-01-04 ENCOUNTER — Other Ambulatory Visit: Payer: Self-pay

## 2019-01-04 NOTE — Patient Instructions (Signed)
Jenna Woods  [id number]     @PREFPERIOPPHARMACY @   Your procedure is scheduled on 01/09/2019  Report to  [name].M.  Call this number if you have problems the morning of surgery:  [phone]    Remember:  Do not eat or drink after midnight.     Take these medicines the morning of surgery with A SIP OF WATER   Do not wear jewelry, make-up or nail polish.  Do not wear lotions, powders, or perfumes, or deodorant.  Do not shave 48 hours prior to surgery.  Men may shave face and neck.  Do not bring valuables to the hospital.  [facility] is not responsible for any belongings or valuables.  Contacts, dentures or bridgework may not be worn into surgery.  Leave your suitcase in the car.  After surgery it may be brought to your room.  For patients admitted to the hospital, discharge time will be determined by your treatment team.  Patients discharged the day of surgery will not be allowed to drive home.   Name and phone number of your driver:   Special instructions:   Please read over the following fact sheets that you were given.  Umbilical Hernia, Adult  A hernia is a bulge of tissue that pushes through an opening between muscles. An umbilical hernia happens in the abdomen, near the belly button (umbilicus). The hernia may contain tissues from the small intestine, large intestine, or fatty tissue covering the intestines (omentum). Umbilical hernias in adults tend to get worse over time, and they require surgical treatment. There are several types of umbilical hernias. You may have:  A hernia located just above or below the umbilicus (indirect hernia). This is the most common type of umbilical hernia in adults.  A hernia that forms through an opening formed by the umbilicus (direct hernia).  A hernia that comes and goes (reducible hernia). A reducible hernia may be visible only when you strain, lift something heavy, or cough. This type of hernia can be pushed  back into the abdomen (reduced).  A hernia that traps abdominal tissue inside the hernia (incarcerated hernia). This type of hernia cannot be reduced.  A hernia that cuts off blood flow to the tissues inside the hernia (strangulated hernia). The tissues can start to die if this happens. This type of hernia requires emergency treatment. What are the causes? An umbilical hernia happens when tissue inside the abdomen presses on a weak area of the abdominal muscles. What increases the risk? You may have a greater risk of this condition if you:  Are obese.  Have had several pregnancies.  Have a buildup of fluid inside your abdomen (ascites).  Have had surgery that weakens the abdominal muscles. What are the signs or symptoms? The main symptom of this condition is a painless bulge at or near the belly button. A reducible hernia may be visible only when you strain, lift something heavy, or cough. Other symptoms may include:  Dull pain.  A feeling of pressure. Symptoms of a strangulated hernia may include:  Pain that gets increasingly worse.  Nausea and vomiting.  Pain when pressing on the hernia.  Skin over the hernia becoming red or purple.  Constipation.  Blood in the stool. How is this diagnosed? This condition may be diagnosed based on:  A physical exam. You may be asked to cough or strain while standing. These actions increase the pressure inside your abdomen and force the hernia through  the opening in your muscles. Your health care provider may try to reduce the hernia by pressing on it.  Your symptoms and medical history. How is this treated? Surgery is the only treatment for an umbilical hernia. Surgery for a strangulated hernia is done as soon as possible. If you have a small hernia that is not incarcerated, you may need to lose weight before having surgery. Follow these instructions at home:  Lose weight, if told by your health care provider.  Do not try to push the  hernia back in.  Watch your hernia for any changes in color or size. Tell your health care provider if any changes occur.  You may need to avoid activities that increase pressure on your hernia.  Do not lift anything that is heavier than 10 lb (4.5 kg) until your health care provider says that this is safe.  Take over-the-counter and prescription medicines only as told by your health care provider.  Keep all follow-up visits as told by your health care provider. This is important. Contact a health care provider if:  Your hernia gets larger.  Your hernia becomes painful. Get help right away if:  You develop sudden, severe pain near the area of your hernia.  You have pain as well as nausea or vomiting.  You have pain and the skin over your hernia changes color.  You develop a fever. This information is not intended to replace advice given to you by your health care provider. Make sure you discuss any questions you have with your health care provider. Document Released: 07/24/2015 Document Revised: 04/05/2017 Document Reviewed: 08/22/2016 Elsevier Patient Education  2020 [employer].

## 2019-01-06 NOTE — H&P (Signed)
[facility] History and Physical  Reason for Referral: Umbilical Hernia Referring Physician:  Fayrene Helper, MD      Chief Complaint    Umbilical Jefferson is a 58 y.o. female.  HPI: Ms. Bogue is a 58 yo very sweet patient who comes in with complaints of pain and a bulge at her umbilicus that she has noticed for several years. She thinks she first recalls it around 2017 when she had a colonoscopy and they noted that she had a hernia at that time. She says that she has some intermittent sharp pain and denies it ever getting stuck out or hard. She says that she has no associated nausea/vomiting or changes in her bowel habits. She has been otherwise in her normal state of health but says that the pain has been occurring more often and the area has been getting larger. Nothing has made the pain better or changed it.  She has lupus and raynaud's listed in her medical history, and has seen rheumatology in the past. She last saw [facility] 2014, but she reports not seeing anyone since that time and not being on any medications.  She has lost the tip of her fingers from a trauma in the past but there was concern for Raynaud's.  Currently, it sounds like her autoimmune diagnosis are asymptomatic at this time.       Past Medical History:  Diagnosis Date  . Allergic reaction   . Allergy    perrenial   . Anxiety   . Arthritis   . Depression    h/o suicidal ideation in 2011  . Depression with anxiety   . Diverticulosis of colon 12/19/2015  . Diverticulosis of colon with hemorrhage 12/19/2015  . GERD (gastroesophageal reflux disease)   . Hypertension 1996  . Obesity   . Peripheral vascular disease ([place])   . Prediabetes 2013  . Raynauds syndrome   . Scleroderma ([place]) 1998  . Systemic lupus erythematosus ([place]) 1998   Treated at [facility]  . Tobacco abuse, in remission    10-pack-years discontinued in 2007  .  Urinary frequency          Past Surgical History:  Procedure Laterality Date  . ABDOMINAL HYSTERECTOMY  1990   Neoplasm  . CESAREAN SECTION     x2  . COLONOSCOPY  2005  . COLONOSCOPY WITH PROPOFOL N/A 08/28/2015   Dr. Laural Golden: scattered medium-mouth diverticula entire colon, external hemorrhoids  . COLONOSCOPY WITH PROPOFOL N/A 07/27/2016   Procedure: COLONOSCOPY WITH PROPOFOL;  Surgeon: Rogene Houston, MD;  [facility];  Service: Endoscopy;  Laterality: N/A;  . FINGER AMPUTATION     Third finger, bilateral, distal  . NASAL SINUS SURGERY  09/06/2011   Procedure: ENDOSCOPIC SINUS SURGERY;  Surgeon: Ascencion Dike, MD;  Location: [place];  Service: ENT;  Laterality: N/A;  Nasal cerebrospinal fluid leak repair with Left temporalis fascia graft and Left Ear cartilage graft  . VASCULAR SURGERY     Hands, bilaterally, [facility]; right hand partial amputation of middle finger.         Family History  Problem Relation Age of Onset  . Arthritis Mother        Rheumatoid  . Dementia Mother   . Hypertension Mother   . Cirrhosis Father   . Alcohol abuse Father   . Arthritis Maternal Grandmother   . Drug abuse Sister   . Scleroderma Brother   .  Graves' disease Son   . Colon cancer Neg Hx     Social History        Tobacco Use  . Smoking status: Former Smoker    Packs/day: 0.25    Years: 25.00    Pack years: 6.25    Types: Cigarettes    Quit date: 04/07/2001    Years since quitting: 17.7  . Smokeless tobacco: Never Used  . Tobacco comment: quit 88 days ago!!  Substance Use Topics  . Alcohol use: No    Alcohol/week: 0.0 standard drinks    Comment: quit in 2004  . Drug use: No    Comment: use to use pot     Medications: I have reviewed the patient's current medications.      Allergies as of 01/03/2019      Reactions   Bee Venom Anaphylaxis   Sesame Oil Anaphylaxis, Swelling   (Sesame  seed)   Aspirin    Diverticulosis with bleed   Molds & Smuts    Codeine Itching, Rash         Medication List       Accurate as of January 03, 2019  1:27 PM. If you have any questions, ask your nurse or doctor.        ALPRAZolam 1 MG tablet Commonly known as: XANAX Take 1 tablet (1 mg total) by mouth 3 (three) times daily.   amLODipine 10 MG tablet Commonly known as: NORVASC Take 1 tablet (10 mg total) by mouth daily.   Calcium 1200 1200-1000 MG-UNIT Chew Chew 1 tablet by mouth daily.   [employer] Iron 325 (65 FE) MG tablet Generic drug: ferrous sulfate Take 325 mg by mouth 1 day or 1 dose.   diazepam 5 MG tablet Commonly known as: VALIUM TAKE 1 TABLET(5 MG) BY MOUTH AT BEDTIME   mirabegron ER 25 MG Tb24 tablet Commonly known as: Myrbetriq Take 1 tablet (25 mg total) by mouth daily. What changed: how much to take Changed by: Philis Pique Wingate, CMA   ondansetron 4 MG tablet Commonly known as: ZOFRAN Take 1 tablet (4 mg total) by mouth every 8 (eight) hours as needed for nausea or vomiting.   pantoprazole 40 MG tablet Commonly known as: PROTONIX Take 1 tablet (40 mg total) by mouth daily.   Vitamin D 50 MCG (2000 UT) Caps Omne daily        ROS:  A comprehensive review of systems was negative except for: Gastrointestinal: positive for abdominal pain, nausea, reflux symptoms and vomiting Genitourinary: positive for frequency Musculoskeletal: positive for stiff joints Endocrine: positive for temperature intolerance and tired/ sluggish/ lupus and raynaud's  Allergic/Immunologic: positive for hay fever  Blood pressure 104/78, pulse 98, temperature 98.6 F (37 C), temperature source Oral, resp. rate 16, height 5\' 6"  (1.676 m), weight 249 lb (112.9 kg), SpO2 95 %. Physical Exam Vitals signs reviewed.  Constitutional:      Appearance: Normal appearance.  HENT:     Head: Normocephalic and atraumatic.     Nose: Nose normal.      Mouth/Throat:     Mouth: Mucous membranes are moist.  Eyes:     Extraocular Movements: Extraocular movements intact.     Pupils: Pupils are equal, round, and reactive to light.  Neck:     Musculoskeletal: Normal range of motion. No neck rigidity.  Cardiovascular:     Rate and Rhythm: Normal rate and regular rhythm.  Pulmonary:     Effort: Pulmonary effort is normal.  Breath sounds: Normal breath sounds.  Abdominal:     General: There is no distension.     Palpations: Abdomen is soft.     Tenderness: There is no abdominal tenderness.     Hernia: A hernia is present.     Comments: Periumbilical hernia, prior midline incisions from hysterectomy and C section she reports  Neurological:     Mental Status: She is alert.     Results: Personally reviewed CT 2009- small defect around the umbilicus noted   Assessment & Plan:  MBER GROSHANS is a 58 y.o. female with an incisional hernia that I see on her CT from 2009, so it has definitely been there for several years. It is now causing her discomfort and she wants to get it repaired. She is not on any medications for her autoimmune diseases at this time.  -Incisional hernia repair with mesh as she has had prior midline incisions in this area  -Discussed risk of bleeding, infection, use of mesh, risk of recurrence, injury to other organs -Discussed need for COVID testing and isolation  -Discussed post operative lifting and activity requirements   All questions were answered to the satisfaction of the patient.   Virl Cagey 01/03/2019, 1:27 PM

## 2019-01-07 ENCOUNTER — Other Ambulatory Visit: Payer: Self-pay

## 2019-01-07 ENCOUNTER — Other Ambulatory Visit (HOSPITAL_COMMUNITY): Admission: RE | Admit: 2019-01-07 | Payer: Medicare Other | Source: Ambulatory Visit

## 2019-01-07 ENCOUNTER — Other Ambulatory Visit (HOSPITAL_COMMUNITY)
Admission: RE | Admit: 2019-01-07 | Discharge: 2019-01-07 | Disposition: A | Discharge status: Home / Self Care | Payer: Medicare Other | Source: Ambulatory Visit | Attending: General Surgery | Admitting: General Surgery

## 2019-01-07 DIAGNOSIS — Z01812 Encounter for preprocedural laboratory examination: Secondary | ICD-10-CM | POA: Diagnosis not present

## 2019-01-07 DIAGNOSIS — Z20828 Contact with and (suspected) exposure to other viral communicable diseases: Secondary | ICD-10-CM | POA: Insufficient documentation

## 2019-01-07 LAB — SARS CORONAVIRUS 2 (TAT 6-24 HRS): SARS Coronavirus 2: NEGATIVE

## 2019-01-08 ENCOUNTER — Other Ambulatory Visit: Payer: Self-pay

## 2019-01-09 ENCOUNTER — Ambulatory Visit (HOSPITAL_COMMUNITY): Payer: Medicare Other | Admitting: Anesthesiology

## 2019-01-09 ENCOUNTER — Encounter (HOSPITAL_COMMUNITY): Payer: Self-pay | Admitting: *Deleted

## 2019-01-09 ENCOUNTER — Encounter (HOSPITAL_COMMUNITY)
Admission: RE | Disposition: A | Discharge status: Home / Self Care | Payer: Self-pay | Source: Home / Self Care | Attending: General Surgery

## 2019-01-09 ENCOUNTER — Ambulatory Visit (HOSPITAL_COMMUNITY)
Admission: RE | Admit: 2019-01-09 | Discharge: 2019-01-09 | Disposition: A | Discharge status: Home / Self Care | Payer: Medicare Other | Attending: General Surgery | Admitting: General Surgery

## 2019-01-09 DIAGNOSIS — Z886 Allergy status to analgesic agent status: Secondary | ICD-10-CM | POA: Insufficient documentation

## 2019-01-09 DIAGNOSIS — K219 Gastro-esophageal reflux disease without esophagitis: Secondary | ICD-10-CM | POA: Insufficient documentation

## 2019-01-09 DIAGNOSIS — E669 Obesity, unspecified: Secondary | ICD-10-CM | POA: Insufficient documentation

## 2019-01-09 DIAGNOSIS — I10 Essential (primary) hypertension: Secondary | ICD-10-CM | POA: Insufficient documentation

## 2019-01-09 DIAGNOSIS — Z87891 Personal history of nicotine dependence: Secondary | ICD-10-CM | POA: Insufficient documentation

## 2019-01-09 DIAGNOSIS — I7300 Raynaud's syndrome without gangrene: Secondary | ICD-10-CM | POA: Insufficient documentation

## 2019-01-09 DIAGNOSIS — Z79899 Other long term (current) drug therapy: Secondary | ICD-10-CM | POA: Insufficient documentation

## 2019-01-09 DIAGNOSIS — K430 Incisional hernia with obstruction, without gangrene: Secondary | ICD-10-CM | POA: Diagnosis not present

## 2019-01-09 DIAGNOSIS — M329 Systemic lupus erythematosus, unspecified: Secondary | ICD-10-CM | POA: Insufficient documentation

## 2019-01-09 DIAGNOSIS — M349 Systemic sclerosis, unspecified: Secondary | ICD-10-CM | POA: Diagnosis not present

## 2019-01-09 DIAGNOSIS — I739 Peripheral vascular disease, unspecified: Secondary | ICD-10-CM | POA: Diagnosis not present

## 2019-01-09 DIAGNOSIS — K429 Umbilical hernia without obstruction or gangrene: Secondary | ICD-10-CM | POA: Diagnosis present

## 2019-01-09 DIAGNOSIS — K432 Incisional hernia without obstruction or gangrene: Secondary | ICD-10-CM

## 2019-01-09 HISTORY — PX: INCISIONAL HERNIA REPAIR: SHX193

## 2019-01-09 HISTORY — PX: INSERTION OF MESH: SHX5868

## 2019-01-09 SURGERY — REPAIR, HERNIA, INCISIONAL
Anesthesia: General | Site: Abdomen

## 2019-01-09 MED ORDER — CHLORHEXIDINE GLUCONATE CLOTH 2 % EX PADS: 6.0000 | MEDICATED_PAD | Freq: Once | CUTANEOUS | Status: DC

## 2019-01-09 MED ORDER — DEXAMETHASONE SODIUM PHOSPHATE 4 MG/ML IJ SOLN
INTRAMUSCULAR | Status: DC | PRN
  Administered 2019-01-09: 8 mg via INTRAVENOUS

## 2019-01-09 MED ORDER — CEFAZOLIN SODIUM-DEXTROSE 2-4 GM/100ML-% IV SOLN
2.0000 g | INTRAVENOUS | Status: AC
  Administered 2019-01-09: 2 g via INTRAVENOUS
  Filled 2019-01-09: qty 100

## 2019-01-09 MED ORDER — LACTATED RINGERS IV SOLN
INTRAVENOUS | Status: DC
  Administered 2019-01-09: 09:00:00 via INTRAVENOUS
  Administered 2019-01-09: 1000 mL via INTRAVENOUS

## 2019-01-09 MED ORDER — MIDAZOLAM HCL 2 MG/2ML IJ SOLN: 0.5000 mg | Freq: Once | INTRAMUSCULAR | Status: DC | PRN

## 2019-01-09 MED ORDER — TRAMADOL HCL 50 MG PO TABS: 50.0000 mg | ORAL_TABLET | Freq: Four times a day (QID) | ORAL | 0 refills | Status: DC | PRN

## 2019-01-09 MED ORDER — ROCURONIUM BROMIDE 10 MG/ML (PF) SYRINGE
PREFILLED_SYRINGE | INTRAVENOUS | Status: AC
  Filled 2019-01-09: qty 10

## 2019-01-09 MED ORDER — LIDOCAINE HCL (CARDIAC) PF 100 MG/5ML IV SOSY
PREFILLED_SYRINGE | INTRAVENOUS | Status: DC | PRN
  Administered 2019-01-09: 60 mg via INTRAVENOUS

## 2019-01-09 MED ORDER — MIDAZOLAM HCL 5 MG/5ML IJ SOLN
INTRAMUSCULAR | Status: DC | PRN
  Administered 2019-01-09: 2 mg via INTRAVENOUS

## 2019-01-09 MED ORDER — BUPIVACAINE LIPOSOME 1.3 % IJ SUSP
INTRAMUSCULAR | Status: DC | PRN
  Administered 2019-01-09: 20 mL

## 2019-01-09 MED ORDER — FENTANYL CITRATE (PF) 100 MCG/2ML IJ SOLN
INTRAMUSCULAR | Status: DC | PRN
  Administered 2019-01-09 (×3): 50 ug via INTRAVENOUS
  Administered 2019-01-09: 25 ug via INTRAVENOUS
  Administered 2019-01-09: 50 ug via INTRAVENOUS
  Administered 2019-01-09: 25 ug via INTRAVENOUS

## 2019-01-09 MED ORDER — ONDANSETRON HCL 4 MG/2ML IJ SOLN
INTRAMUSCULAR | Status: DC | PRN
  Administered 2019-01-09: 4 mg via INTRAVENOUS

## 2019-01-09 MED ORDER — SUGAMMADEX SODIUM 200 MG/2ML IV SOLN
INTRAVENOUS | Status: DC | PRN
  Administered 2019-01-09: 224 mg via INTRAVENOUS

## 2019-01-09 MED ORDER — PROPOFOL 10 MG/ML IV BOLUS
INTRAVENOUS | Status: AC
  Filled 2019-01-09: qty 40

## 2019-01-09 MED ORDER — PROPOFOL 10 MG/ML IV BOLUS
INTRAVENOUS | Status: DC | PRN
  Administered 2019-01-09: 40 mg via INTRAVENOUS
  Administered 2019-01-09: 180 mg via INTRAVENOUS

## 2019-01-09 MED ORDER — PROMETHAZINE HCL 25 MG/ML IJ SOLN: 6.2500 mg | INTRAMUSCULAR | Status: DC | PRN

## 2019-01-09 MED ORDER — GLYCOPYRROLATE 0.2 MG/ML IJ SOLN
INTRAMUSCULAR | Status: DC | PRN
  Administered 2019-01-09: 0.2 mg via INTRAVENOUS

## 2019-01-09 MED ORDER — BUPIVACAINE LIPOSOME 1.3 % IJ SUSP
INTRAMUSCULAR | Status: AC
  Filled 2019-01-09: qty 20

## 2019-01-09 MED ORDER — MIDAZOLAM HCL 2 MG/2ML IJ SOLN
INTRAMUSCULAR | Status: AC
  Filled 2019-01-09: qty 2

## 2019-01-09 MED ORDER — DEXAMETHASONE SODIUM PHOSPHATE 10 MG/ML IJ SOLN
INTRAMUSCULAR | Status: AC
  Filled 2019-01-09: qty 1

## 2019-01-09 MED ORDER — HYDROMORPHONE HCL 1 MG/ML IJ SOLN
0.2500 mg | INTRAMUSCULAR | Status: DC | PRN
  Administered 2019-01-09 (×2): 0.5 mg via INTRAVENOUS
  Filled 2019-01-09 (×2): qty 0.5

## 2019-01-09 MED ORDER — PHENYLEPHRINE HCL (PRESSORS) 10 MG/ML IV SOLN
INTRAVENOUS | Status: DC | PRN
  Administered 2019-01-09: 80 ug via INTRAVENOUS

## 2019-01-09 MED ORDER — ROCURONIUM BROMIDE 100 MG/10ML IV SOLN
INTRAVENOUS | Status: DC | PRN
  Administered 2019-01-09: 10 mg via INTRAVENOUS
  Administered 2019-01-09: 30 mg via INTRAVENOUS

## 2019-01-09 MED ORDER — 0.9 % SODIUM CHLORIDE (POUR BTL) OPTIME
TOPICAL | Status: DC | PRN
  Administered 2019-01-09: 1000 mL

## 2019-01-09 MED ORDER — EPHEDRINE SULFATE 50 MG/ML IJ SOLN
INTRAMUSCULAR | Status: DC | PRN
  Administered 2019-01-09: 10 mg via INTRAVENOUS

## 2019-01-09 MED ORDER — FENTANYL CITRATE (PF) 250 MCG/5ML IJ SOLN
INTRAMUSCULAR | Status: AC
  Filled 2019-01-09: qty 5

## 2019-01-09 MED ORDER — DOCUSATE SODIUM 100 MG PO CAPS: 100.0000 mg | ORAL_CAPSULE | Freq: Two times a day (BID) | ORAL | 2 refills | Status: DC

## 2019-01-09 MED ORDER — ONDANSETRON HCL 4 MG/2ML IJ SOLN
INTRAMUSCULAR | Status: AC
  Filled 2019-01-09: qty 2

## 2019-01-09 SURGICAL SUPPLY — 47 items
ADH SKN CLS APL DERMABOND .7 (GAUZE/BANDAGES/DRESSINGS) ×1
APL PRP STRL LF DISP 70% ISPRP (MISCELLANEOUS) ×1
BINDER ABDOMINAL 12 XL 75-84 (SOFTGOODS) ×2 IMPLANT
BLADE SURG SZ11 CARB STEEL (BLADE) ×4 IMPLANT
CHLORAPREP W/TINT 26 (MISCELLANEOUS) ×4 IMPLANT
CLOTH BEACON ORANGE TIMEOUT ST (SAFETY) ×4 IMPLANT
COVER LIGHT HANDLE STERIS (MISCELLANEOUS) ×8 IMPLANT
COVER WAND RF STERILE (DRAPES) ×4 IMPLANT
DERMABOND ADVANCED (GAUZE/BANDAGES/DRESSINGS) ×2
DERMABOND ADVANCED .7 DNX12 (GAUZE/BANDAGES/DRESSINGS) IMPLANT
DRSG TEGADERM 2-3/8X2-3/4 SM (GAUZE/BANDAGES/DRESSINGS) ×2 IMPLANT
ELECT REM PT RETURN 9FT ADLT (ELECTROSURGICAL) ×3
ELECTRODE REM PT RTRN 9FT ADLT (ELECTROSURGICAL) ×2 IMPLANT
GAUZE 4X4 16PLY RFD (DISPOSABLE) ×2 IMPLANT
GAUZE SPONGE 4X4 12PLY STRL (GAUZE/BANDAGES/DRESSINGS) ×4 IMPLANT
GLOVE BIO SURGEON STRL SZ 6.5 (GLOVE) ×4 IMPLANT
GLOVE BIO SURGEON STRL SZ7 (GLOVE) ×2 IMPLANT
GLOVE BIO SURGEONS STRL SZ 6.5 (GLOVE) ×2
GLOVE BIOGEL PI IND STRL 6.5 (GLOVE) ×2 IMPLANT
GLOVE BIOGEL PI IND STRL 7.0 (GLOVE) ×2 IMPLANT
GLOVE BIOGEL PI INDICATOR 6.5 (GLOVE) ×2
GLOVE BIOGEL PI INDICATOR 7.0 (GLOVE) ×8
GOWN STRL REUS W/TWL LRG LVL3 (GOWN DISPOSABLE) ×6 IMPLANT
INST SET MINOR GENERAL (KITS) ×4 IMPLANT
KIT TURNOVER KIT A (KITS) ×4 IMPLANT
MANIFOLD NEPTUNE II (INSTRUMENTS) ×4 IMPLANT
MESH VENTRALEX ST 8CM LRG (Mesh General) ×2 IMPLANT
NDL HYPO 18GX1.5 BLUNT FILL (NEEDLE) IMPLANT
NDL HYPO 21X1.5 SAFETY (NEEDLE) ×2 IMPLANT
NEEDLE HYPO 18GX1.5 BLUNT FILL (NEEDLE) ×3 IMPLANT
NEEDLE HYPO 21X1.5 SAFETY (NEEDLE) ×3 IMPLANT
NS IRRIG 1000ML POUR BTL (IV SOLUTION) ×4 IMPLANT
PACK MINOR (CUSTOM PROCEDURE TRAY) ×4 IMPLANT
PAD ARMBOARD 7.5X6 YLW CONV (MISCELLANEOUS) ×4 IMPLANT
SET BASIN LINEN APH (SET/KITS/TRAYS/PACK) ×4 IMPLANT
SPONGE GAUZE 2X2 8PLY STER LF (GAUZE/BANDAGES/DRESSINGS) ×2
SPONGE GAUZE 2X2 8PLY STRL LF (GAUZE/BANDAGES/DRESSINGS) ×2 IMPLANT
STAPLER VISISTAT (STAPLE) ×2 IMPLANT
SUT ETHIBOND 0 MO6 C/R (SUTURE) ×2 IMPLANT
SUT MNCRL AB 4-0 PS2 18 (SUTURE) ×2 IMPLANT
SUT SILK 2 0 (SUTURE) ×3
SUT SILK 2-0 18XBRD TIE 12 (SUTURE) IMPLANT
SUT VIC AB 2-0 CT1 27 (SUTURE) ×3
SUT VIC AB 2-0 CT1 TAPERPNT 27 (SUTURE) ×2 IMPLANT
SUT VIC AB 3-0 SH 27 (SUTURE) ×3
SUT VIC AB 3-0 SH 27X BRD (SUTURE) ×2 IMPLANT
SYR 20ML LL LF (SYRINGE) ×6 IMPLANT

## 2019-01-09 NOTE — Anesthesia Postprocedure Evaluation (Signed)
Anesthesia Post Note  Patient: Jenna Woods  Procedure(s) Performed: HERNIA REPAIR INCISIONAL (N/A Abdomen) INSERTION OF MESH (N/A Abdomen)  Patient location during evaluation: PACU Anesthesia Type: General Level of consciousness: awake and alert, oriented and patient cooperative Pain management: pain level controlled Vital Signs Assessment: post-procedure vital signs reviewed and stable Respiratory status: spontaneous breathing Cardiovascular status: stable Postop Assessment: no apparent nausea or vomiting Anesthetic complications: no     Last Vitals:  Vitals:   01/09/19 0858 01/09/19 0900  BP:  97/66  Resp: (!) 23 16  Temp:    SpO2:  96%    Last Pain:  Vitals:   01/09/19 0843  TempSrc:   PainSc: 8                  ADAMS, AMY A

## 2019-01-09 NOTE — Transfer of Care (Signed)
Immediate Anesthesia Transfer of Care Note  Patient: Jenna Woods  Procedure(s) Performed: HERNIA REPAIR INCISIONAL (N/A Abdomen) INSERTION OF MESH (N/A Abdomen)  Patient Location: PACU  Anesthesia Type:General  Level of Consciousness: awake, oriented and patient cooperative  Airway & Oxygen Therapy: Patient Spontanous Breathing  Post-op Assessment: Report given to RN and Post -op Vital signs reviewed and stable  Post vital signs: Reviewed and stable  Last Vitals:  Vitals Value Taken Time  BP 106/73 01/09/19 0845  Temp    Pulse    Resp 21 01/09/19 0847  SpO2    Vitals shown include unvalidated device data.  Last Pain:  Vitals:   01/09/19 0654  TempSrc: Oral  PainSc: 0-No pain      Patients Stated Pain Goal: 7 ([id number])  Complications: No apparent anesthesia complications

## 2019-01-09 NOTE — Discharge Instructions (Signed)
Discharge Instructions Hernia:  Common Complaints: Pain at the incision site is common. This will improve with time. Take your pain medications as described below. Some nausea is common and poor appetite. The main goal is to stay hydrated the first few days after surgery.   Diet/ Activity: Diet as tolerated. You may not have an appetite, but it is important to stay hydrated. Drink 64 ounces of water a day. Your appetite will return with time.  Remove the small clear dressing and gauze after two days (48 hours/ 01/11/2019) Trim the gauze off the glue over the incision if the gauze is stuck to the glue. Shower per your regular routine daily.  Do not take hot showers.  Take warm showers that are less than 10 minutes. Pat the incision dry.  Rest and listen to your body, but do not remain in bed all day.Walk everyday for at least 15-20 minutes.  Deep cough and move around every 1-2 hours in the first few days after surgery. Do not pick at the dermabond glue on your incision sites.  This glue film will remain in place for 1-2 weeks and will start to peel off. Do not place lotions or balms on your incision unless instructed to specifically by Dr. Constance Haw. Do not lift > 10 lbs, perform excessive bending, pushing, pulling, squatting for 6-8 weeks after surgery. Where your abdominal binder with activity as much as possible. The activity restrictions and the abdominal binder are to prevent hernia formation at your incision while you are healing.   Medication: Take tylenol and ibuprofen as needed for pain control, alternating every 4-6 hours.  Example:  Tylenol 1000mg  @ 6am, 12noon, 6pm, 60midnight (Do not exceed 4000mg  of tylenol a day). Ibuprofen 800mg  @ 9am, 3pm, 9pm, 3am (Do not exceed 3600mg  of ibuprofen a day).  Take Tramadol for breakthrough pain every 6 hours.  Take Colace for constipation related to narcotic pain medication. If you do not have a bowel movement in 2 days, take Miralax over the  counter.  Drink plenty of water to also prevent constipation.   Contact Information: If you have questions or concerns, please call our office, [phone], Monday- Thursday 8AM-5PM and Friday 8AM-12Noon.  If it is after hours or on the weekend, please call [facility] Main [facility], [phone], and ask to speak to the surgeon on call for Dr. Constance Haw at [facility].     Open Hernia Repair, Adult, Care After These instructions give you information about caring for yourself after your procedure. Your doctor may also give you more specific instructions. If you have problems or questions, contact your doctor. Follow these instructions at home: Surgical cut (incision) care   Follow instructions from your doctor about how to take care of your surgical cut area. Make sure you: ? Wash your hands with soap and water before you change your bandage (dressing). If you cannot use soap and water, use hand sanitizer. ? Change your bandage as told by your doctor. ? Leave stitches (sutures), skin glue, or skin tape (adhesive) strips in place. They may need to stay in place for 2 weeks or longer. If tape strips get loose and curl up, you may trim the loose edges. Do not remove tape strips completely unless your doctor says it is okay.  Check your surgical cut every day for signs of infection. Check for: ? More redness, swelling, or pain. ? More fluid or blood. ? Warmth. ? Pus or a bad smell. Activity  Do not drive or  use heavy machinery while taking prescription pain medicine. Do not drive until your doctor says it is okay.  Until your doctor says it is okay: ? Do not lift anything that is heavier than 10 lb (4.5 kg). ? Do not play contact sports.  Return to your normal activities as told by your doctor. Ask your doctor what activities are safe. General instructions  To prevent or treat having a hard time pooping (constipation) while you are taking prescription pain medicine, your doctor may recommend  that you: ? Drink enough fluid to keep your pee (urine) clear or pale yellow. ? Take over-the-counter or prescription medicines. ? Eat foods that are high in fiber, such as fresh fruits and vegetables, whole grains, and beans. ? Limit foods that are high in fat and processed sugars, such as fried and sweet foods.  Take over-the-counter and prescription medicines only as told by your doctor.  Do not take baths, swim, or use a hot tub until your doctor says it is okay.  Keep all follow-up visits as told by your doctor. This is important. Contact a doctor if:  You develop a rash.  You have more redness, swelling, or pain around your surgical cut.  You have more fluid or blood coming from your surgical cut.  Your surgical cut feels warm to the touch.  You have pus or a bad smell coming from your surgical cut.  You have a fever or chills.  You have blood in your poop (stool).  You have not pooped in 2-3 days.  Medicine does not help your pain. Get help right away if:  You have chest pain or you are short of breath.  You feel light-headed.  You feel weak and dizzy (feel faint).  You have very bad pain.  You throw up (vomit) and your pain is worse. This information is not intended to replace advice given to you by your health care provider. Make sure you discuss any questions you have with your health care provider. Document Released: 03/14/2014 Document Revised: 06/15/2018 Document Reviewed: 08/05/2015 Elsevier Patient Education  2020 [employer].

## 2019-01-09 NOTE — Op Note (Addendum)
[facility] Operative Note  01/09/19  Preoperative Diagnosis: Incisional hernia    Postoperative Diagnosis: Incisional hernia with incarcerated omentum    Procedure(s) Performed: Incisional hernia repair with mesh (Ventralex ST Patch 8cm)    Surgeon: Lanell Matar. Constance Haw, MD   Assistants: No qualified resident was available    Anesthesia: General endotracheal   Anesthesiologist: Lenice Llamas, MD    Specimens: None    Estimated Blood Loss: Minimal   Blood Replacement: None    Complications: None   Wound Class: Clean    Operative Indications: Ms. Forino is a 58 yo has noticed a bulge around her umbilicus where she has had multiple surgeries in the past including an open hysterectomy and cesarean sections in the past.  She feels that the area has been getting larger and is causing more discomfort.  We discussed the risk and benefits of surgery including but not limited to bleeding, infection, recurrence, use of mesh, and injury to other organs, and she has opted to proceed.   Findings: Hernia with omentum and adhesions from the omentum to the hernia sac causing a partial incarceration of the omentum    Procedure: The patient was taken to the operating room and placed supine. General endotracheal anesthesia was induced. Intravenous antibiotics were administered per protocol.  The abdomen was prepared and draped in the usual sterile fashion.   The incisional hernia was noted to be partially reducible in the periumbilical area and the fascial defect was difficult to appreciate.  An incision was made around the umbilicus, and carried down through the subcutaneous tissue with electrocautery.  Dissection was performed down to the level of the fascia, exposing the hernia sac.  The hernia sac was opened with care, and excess hernia sac was resected with electrocautery. The hernia sac had adhesions/ fusion with the omentum, and this was carefully dissected off with  electrocautery, and some of the omentum was amputated and suture ligated with a 2-0 Silk suture.   A finger was ran on the underlying peritoneum and this was clear. There was some minor bleeding noted, which seemed to be coming from the omentum and the hernia edge. This was controlled with cautery.  A 8 cm Ventralex St Hernia Patch was placed.  Again there appeared to be more blood on the patch that I would have expected, so I removed the patch and again looked along the edges of the fascia and at the ends of the omentum, noting no obvious bleeding sources.  The Ventralex patch was placed back into the abdomen and secured with 0 Ethibond sutures ensuring that it was against the peritoneal cavity.  The hernia defect was then closed with 0 Ethibond suture in an interrupted fashion over the patch.  The umbilical stalk was still intact, but there was some dead space that was closed with a 3-0 Vicryl suture.   Hemostasis was confirmed. The skin was closed with a running 4-0 Monocryl suture and dermabond.  After the dermabond dried a 2X2 and tegaderm were placed over the umbilicus to act as a pressure dressing.     All counts were correct at the end of the case. The patient was awakened from anesthesia and extubated without complication.  The patient went to the PACU in stable condition.   Curlene Labrum, MD [facility] [address] [place], [place] [address] [phone] (office)

## 2019-01-09 NOTE — Progress Notes (Signed)
[facility]  Patient's surgery complete. Some with Elmo Putt. Rx sent to [facility].  Curlene Labrum, MD [facility] [address] [place], [place] [address] [phone] (office)

## 2019-01-09 NOTE — Anesthesia Preprocedure Evaluation (Signed)
Anesthesia Evaluation  Patient identified by MRN, date of birth, ID band Patient awake    Reviewed: Allergy & Precautions, NPO status , Patient's Chart, lab work & pertinent test results  Airway Mallampati: I  TM Distance: >3 FB Neck ROM: Full    Dental no notable dental hx. (+) Teeth Intact   Pulmonary neg pulmonary ROS, former smoker,    Pulmonary exam normal breath sounds clear to auscultation       Cardiovascular Exercise Tolerance: Good hypertension, Pt. on medications + Peripheral Vascular Disease  Normal cardiovascular examI Rhythm:Regular Rate:Normal  H/o scleroderma -no recent issues -had finger amp ~7 years prior    Neuro/Psych Anxiety Depression negative neurological ROS  negative psych ROS   GI/Hepatic Neg liver ROS, GERD  Medicated and Controlled,  Endo/Other  negative endocrine ROS  Renal/GU negative Renal ROS  negative genitourinary   Musculoskeletal  (+) Arthritis , Osteoarthritis,    Abdominal   Peds negative pediatric ROS (+)  Hematology negative hematology ROS (+)   Anesthesia Other Findings   Reproductive/Obstetrics negative OB ROS                             Anesthesia Physical Anesthesia Plan  ASA: III  Anesthesia Plan: General   Post-op Pain Management:    Induction: Intravenous  PONV Risk Score and Plan: 3 and Midazolam, Ondansetron, Dexamethasone and Treatment may vary due to age or medical condition  Airway Management Planned: Oral ETT  Additional Equipment:   Intra-op Plan:   Post-operative Plan: Extubation in OR  Informed Consent: I have reviewed the patients History and Physical, chart, labs and discussed the procedure including the risks, benefits and alternatives for the proposed anesthesia with the patient or authorized representative who has indicated his/her understanding and acceptance.     Dental advisory given  Plan Discussed with:  CRNA  Anesthesia Plan Comments: (Plan Full PPE use Plan GETA D/W PT -WTP with same after Q&A)        Anesthesia Quick Evaluation

## 2019-01-09 NOTE — Progress Notes (Addendum)
Called Pts ride at [id number] and Elmo Putt, her god daughter stated she would be here in 25-30 mins. Wheeled pt out at 10:15 am and Pt called her ride off of her cell phone and Elmo Putt stated that she would be heading this way and be here in about 15 min and was told to call the hospital when she has arrived and that I would wheel pt back to short stay post op area. Called Pt ride again at 10:26 am with no answer and Left a message on voicemail to please call us when she has arrived. Monitoring pt while waiting on ride and was offered a snack and drink. Called Pt ride again at 10:40am Which resulted in an answer and Elmo Putt said she just pulled into the parking lot at the time of the call. Sandria Senter was discharging a patient as well at the time and assisted with getting my pt into the car. Discharged at [address], RN 10:35 AM 01/09/19

## 2019-01-09 NOTE — Anesthesia Procedure Notes (Signed)
Procedure Name: Intubation Date/Time: 01/09/2019 7:29 AM Performed by: Andree Elk, Jaysion Ramseyer A, CRNA Pre-anesthesia Checklist: Patient identified, Emergency Drugs available, Suction available, Patient being monitored and Timeout performed Patient Re-evaluated:Patient Re-evaluated prior to induction Oxygen Delivery Method: Circle system utilized Preoxygenation: Pre-oxygenation with 100% oxygen Induction Type: IV induction Ventilation: Mask ventilation without difficulty Laryngoscope Size: Mac and 3 Grade View: Grade I Tube type: Oral Tube size: 7.0 mm Number of attempts: 1 Airway Equipment and Method: Stylet Placement Confirmation: ETT inserted through vocal cords under direct vision,  positive ETCO2 and breath sounds checked- equal and bilateral Secured at: 21 cm Tube secured with: Tape Dental Injury: Teeth and Oropharynx as per pre-operative assessment

## 2019-01-09 NOTE — Interval H&P Note (Signed)
History and Physical Interval Note:  [id number] AM  Jenna Woods  has presented today for surgery, with the diagnosis of INCISIONAL HERNIA.  The various methods of treatment have been discussed with the patient and family. After consideration of risks, benefits and other options for treatment, the patient has consented to  Procedure(s): HERNIA REPAIR INCISIONAL (N/A) INSERTION OF MESH (N/A) as a surgical intervention.  The patient's history has been reviewed, patient examined, no change in status, stable for surgery.  I have reviewed the patient's chart and labs.  Questions were answered to the patient's satisfaction.    Incisional hernia repair. No question.  Virl Cagey

## 2019-01-10 ENCOUNTER — Encounter (HOSPITAL_COMMUNITY): Payer: Self-pay | Admitting: General Surgery

## 2019-01-10 ENCOUNTER — Encounter: Payer: Medicare Other | Admitting: Family Medicine

## 2019-01-24 ENCOUNTER — Other Ambulatory Visit: Payer: Self-pay

## 2019-01-24 ENCOUNTER — Encounter: Payer: Self-pay | Admitting: General Surgery

## 2019-01-24 ENCOUNTER — Ambulatory Visit (INDEPENDENT_AMBULATORY_CARE_PROVIDER_SITE_OTHER): Payer: Self-pay | Admitting: General Surgery

## 2019-01-24 VITALS — BP 111/88 | HR 93 | Temp 98.2°F | Resp 16 | Ht 67.0 in | Wt 246.0 lb

## 2019-01-24 DIAGNOSIS — K432 Incisional hernia without obstruction or gangrene: Secondary | ICD-10-CM

## 2019-01-24 NOTE — Patient Instructions (Signed)
Do not lift > 10 lbs, perform excessive bending, pushing, pulling, squatting for 6-8 weeks after surgery.  The activity restrictions and the abdominal binder are to prevent hernia formation at your incision while you are healing.

## 2019-01-24 NOTE — Progress Notes (Signed)
[facility] Note   HPI:  58 y.o. Female presents to clinic for post-op follow-up evaluation after her incisional hernia repair.. Patient reports she is doing great. She has no pain and is eating and drinking and having Bms.   Review of Systems:  No abdominal pain No nausea/vomiting No redness or drainage or incision All other review of systems: otherwise negative   Vital Signs:  BP 111/88 (BP Location: Right Arm, Patient Position: Sitting, Cuff Size: Large)   Pulse 93   Temp 98.2 F (36.8 C) (Oral)   Resp 16   Ht 5\' 7"  (1.702 m)   Wt 246 lb (111.6 kg)   SpO2 94%   BMI 38.53 kg/m    Physical Exam:  Physical Exam Vitals signs reviewed.  HENT:     Head: Normocephalic.  Cardiovascular:     Rate and Rhythm: Normal rate.  Pulmonary:     Effort: Pulmonary effort is normal.  Abdominal:     General: There is no distension.     Palpations: Abdomen is soft.     Tenderness: There is no abdominal tenderness.     Hernia: No hernia is present.     Comments: Healing periumbilical incision, no erythema or drainage, no signs of hernia, nontender   Neurological:     Mental Status: She is alert.      Assessment:  58 y.o. yo Female s/p incisional hernia repair with mesh. Doing well.  Plan:  Do not lift > 10 lbs, perform excessive bending, pushing, pulling, squatting for 6-8 weeks after surgery.  The activity restrictions and the abdominal binder are to prevent hernia formation at your incision while you are healing.   All of the above recommendations were discussed with the patient, and all of patient's questions were answered to her expressed satisfaction.  Curlene Labrum, MD [facility] [address] [place], [place] [id number] [phone] (office)

## 2019-01-26 ENCOUNTER — Other Ambulatory Visit: Payer: Self-pay | Admitting: Family Medicine

## 2019-02-14 ENCOUNTER — Ambulatory Visit (INDEPENDENT_AMBULATORY_CARE_PROVIDER_SITE_OTHER): Payer: Medicare Other | Admitting: Psychiatry

## 2019-02-14 ENCOUNTER — Other Ambulatory Visit: Payer: Self-pay

## 2019-02-14 ENCOUNTER — Encounter (HOSPITAL_COMMUNITY): Payer: Self-pay | Admitting: Psychiatry

## 2019-02-14 DIAGNOSIS — F331 Major depressive disorder, recurrent, moderate: Secondary | ICD-10-CM

## 2019-02-14 DIAGNOSIS — F411 Generalized anxiety disorder: Secondary | ICD-10-CM | POA: Diagnosis not present

## 2019-02-14 MED ORDER — DIAZEPAM 5 MG PO TABS: ORAL_TABLET | ORAL | 2 refills | Status: DC

## 2019-02-14 MED ORDER — ALPRAZOLAM 1 MG PO TABS: 1.0000 mg | ORAL_TABLET | Freq: Three times a day (TID) | ORAL | 2 refills | Status: DC

## 2019-02-14 NOTE — Progress Notes (Signed)
Virtual Visit via Telephone Note  I connected with Jenna Woods on [id number] at  1:40 PM EST by telephone and verified that I am speaking with the correct person using two identifiers.   I discussed the limitations, risks, security and privacy concerns of performing an evaluation and management service by telephone and the availability of in person appointments. I also discussed with the patient that there may be a patient responsible charge related to this service. The patient expressed understanding and agreed to proceed.    I discussed the assessment and treatment plan with the patient. The patient was provided an opportunity to ask questions and all were answered. The patient agreed with the plan and demonstrated an understanding of the instructions.   The patient was advised to call back or seek an in-person evaluation if the symptoms worsen or if the condition fails to improve as anticipated.  I provided 15 minutes of non-face-to-face time during this encounter.   Levonne Spiller, MD  [facility] MD/PA/NP OP Progress Note  [id number] PM Jenna Woods  MRN:  [id number]  Chief Complaint:  Chief Complaint    Anxiety; Follow-up     HPI: This patient is a 58 year old divorced black female who lives alone in [place].  She has 2 grown sons.  She is on disability.  The patient returns after 4 months regarding treatment of anxiety.  She states that she had hernia surgery in October and it went very well.  She is feeling good and no longer has the stomach discomfort.  Her anxiety is under good control and she tries not to worry too much about her sons who have their issues with girlfriends children etc.  She realizes there is only so much she can do.  She is sleeping well with the Valium and the Xanax continues to help her anxiety.  She denies depression or suicidal ideation Visit Diagnosis:    ICD-10-CM   1. Anxiety state  F41.1   2. Major depressive disorder, recurrent episode,  moderate (HCC)  F33.1     Past Psychiatric History: none  Past Medical History:  Past Medical History:  Diagnosis Date  . Allergic reaction   . Allergy    perrenial   . Anxiety   . Arthritis   . Depression    h/o suicidal ideation in 2011  . Depression with anxiety   . Diverticulosis of colon 12/19/2015  . Diverticulosis of colon with hemorrhage 12/19/2015  . GERD (gastroesophageal reflux disease)   . Hypertension 1996  . Obesity   . Peripheral vascular disease ([place])   . Prediabetes 2013  . Raynauds syndrome   . Scleroderma ([place]) 1998  . Systemic lupus erythematosus ([place]) 1998   Treated at [facility]  . Tobacco abuse, in remission    10-pack-years discontinued in 2007  . Urinary frequency     Past Surgical History:  Procedure Laterality Date  . ABDOMINAL HYSTERECTOMY  1990   Neoplasm  . CESAREAN SECTION     x2  . COLONOSCOPY  2005  . COLONOSCOPY WITH PROPOFOL N/A 08/28/2015   Dr. Laural Golden: scattered medium-mouth diverticula entire colon, external hemorrhoids  . COLONOSCOPY WITH PROPOFOL N/A 07/27/2016   Procedure: COLONOSCOPY WITH PROPOFOL;  Surgeon: Rogene Houston, MD;  Location[facility];  Service: Endoscopy;  Laterality: N/A;  . FINGER AMPUTATION     Third finger, bilateral, distal  . INCISIONAL HERNIA REPAIR N/A 01/09/2019   Procedure: HERNIA REPAIR INCISIONAL;  Surgeon: Virl Cagey, MD;  Location: [facility] ORS;  Service: General;  Laterality: N/A;  . INSERTION OF MESH N/A 01/09/2019   Procedure: INSERTION OF MESH;  Surgeon: Virl Cagey, MD;  Location: [facility];  Service: General;  Laterality: N/A;  . NASAL SINUS SURGERY  09/06/2011   Procedure: ENDOSCOPIC SINUS SURGERY;  Surgeon: Ascencion Dike, MD;  Location: [place];  Service: ENT;  Laterality: N/A;  Nasal cerebrospinal fluid leak repair with Left temporalis fascia graft and Left Ear cartilage graft  . VASCULAR SURGERY     Hands, bilaterally, [facility]; right hand partial amputation of  middle finger.    Family Psychiatric History: see below  Family History:  Family History  Problem Relation Age of Onset  . Arthritis Mother        Rheumatoid  . Dementia Mother   . Hypertension Mother   . Cirrhosis Father   . Alcohol abuse Father   . Arthritis Maternal Grandmother   . Drug abuse Sister   . Scleroderma Brother   . Graves' disease Son   . Colon cancer Neg Hx     Social History:  Social History   Socioeconomic History  . Marital status: Single    Spouse name: Not on file  . Number of children: 2  . Years of education: 33  . Highest education level: 12th grade  Occupational History  . Occupation: Art therapist: [employer]    Comment: Disability awarded in [place] Use  . Smoking status: Former Smoker    Packs/day: 0.25    Years: 25.00    Pack years: 6.25    Types: Cigarettes    Quit date: 04/07/2001    Years since quitting: 17.8  . Smokeless tobacco: Never Used  . Tobacco comment: quit 88 days ago!!  Substance and Sexual Activity  . Alcohol use: No    Alcohol/week: 0.0 standard drinks    Comment: quit in 2004  . Drug use: No    Comment: use to use pot   . Sexual activity: Not Currently    Birth control/protection: Surgical  Other Topics Concern  . Not on file  Social History Narrative   Currently living with son in [place], moving back soon    Social Determinants of Health   Financial Resource Strain: High Risk  . Difficulty of Paying Living Expenses: Hard  Food Insecurity: No Food Insecurity  . Worried About Charity fundraiser in the Last Year: Never true  . Ran Out of Food in the Last Year: Never true  Transportation Needs: No Transportation Needs  . Lack of Transportation (Medical): No  . Lack of Transportation (Non-Medical): No  Physical Activity: Sufficiently Active  . Days of Exercise per Week: 3 days  . Minutes of Exercise per Session: 50 min  Stress: Stress Concern Present  . Feeling of Stress :  Rather much  Social Connections: Slightly Isolated  . Frequency of Communication with Friends and Family: More than three times a week  . Frequency of Social Gatherings with Friends and Family: Patient refused  . Attends Religious Services: More than 4 times per year  . Active Member of Clubs or Organizations: Yes  . Attends Archivist Meetings: More than 4 times per year  . Marital Status: Separated    Allergies:  Allergies  Allergen Reactions  . Bee Venom Anaphylaxis  . Sesame Oil Anaphylaxis and Swelling    (Sesame seed)  . Aspirin  Diverticulosis with bleed  . Molds & Smuts   . Codeine Itching and Rash    Metabolic Disorder Labs: Lab Results  Component Value Date   HGBA1C 6.0 (H) 09/03/2018   MPG 126 09/03/2018   MPG 128 01/29/2018   No results found for: PROLACTIN Lab Results  Component Value Date   CHOL 136 09/03/2018   TRIG 97 09/03/2018   HDL 50 09/03/2018   CHOLHDL 2.7 09/03/2018   VLDL 15 06/30/2016   LDLCALC 68 09/03/2018   LDLCALC 66 08/28/2017   Lab Results  Component Value Date   TSH 0.64 01/29/2018   TSH 1.10 12/13/2016    Therapeutic Level Labs: No results found for: LITHIUM No results found for: VALPROATE No components found for:  CBMZ  Current Medications: Current Outpatient Medications  Medication Sig Dispense Refill  . ALPRAZolam (XANAX) 1 MG tablet Take 1 tablet (1 mg total) by mouth 3 (three) times daily. 90 tablet 2  . amLODipine (NORVASC) 10 MG tablet Take 1 tablet (10 mg total) by mouth daily. 90 tablet 1  . Calcium Carbonate-Vit D-Min (CALCIUM 1200) 1200-1000 MG-UNIT CHEW Chew 1 tablet by mouth daily.    . Cholecalciferol (VITAMIN D) 2000 units CAPS Omne daily 30 capsule   . diazepam (VALIUM) 5 MG tablet TAKE 1 TABLET(5 MG) BY MOUTH AT BEDTIME 30 tablet 2  . docusate sodium (COLACE) 100 MG capsule Take 1 capsule (100 mg total) by mouth 2 (two) times daily. 60 capsule 2  . ferrous sulfate ([employer] IRON) 325 (65 FE) MG  tablet Take 325 mg by mouth 1 day or 1 dose. 60 tablet 3  . mirabegron ER (MYRBETRIQ) 25 MG TB24 tablet Take 1 tablet (25 mg total) by mouth daily. 30 tablet 5  . ondansetron (ZOFRAN) 4 MG tablet Take 1 tablet (4 mg total) by mouth every 8 (eight) hours as needed for nausea or vomiting. 30 tablet 1  . pantoprazole (PROTONIX) 40 MG tablet Take 1 tablet (40 mg total) by mouth daily. 90 tablet 1  . traMADol (ULTRAM) 50 MG tablet Take 1 tablet (50 mg total) by mouth every 6 (six) hours as needed for severe pain. 45 tablet 0   No current facility-administered medications for this visit.     Musculoskeletal: Strength & Muscle Tone: within normal limits Gait & Station: normal Patient leans: N/A  Psychiatric Specialty Exam: Review of Systems  All other systems reviewed and are negative.   There were no vitals taken for this visit.There is no height or weight on file to calculate BMI.  General Appearance: NA  Eye Contact:  NA  Speech:  Clear and Coherent  Volume:  Normal  Mood:  Euthymic  Affect:  NA  Thought Process:  Goal Directed  Orientation:  Full (Time, Place, and Person)  Thought Content: Rumination   Suicidal Thoughts:  No  Homicidal Thoughts:  No  Memory:  Immediate;   Good Recent;   Good Remote;   Fair  Judgement:  Good  Insight:  Good  Psychomotor Activity:  Normal  Concentration:  Concentration: Good and Attention Span: Good  Recall:  Good  Fund of Knowledge: Good  Language: Good  Akathisia:  No  Handed:  Right  AIMS (if indicated): not done  Assets:  Communication Skills Desire for Improvement Physical Health Resilience Social Support Talents/Skills  ADL's:  Intact  Cognition: WNL  Sleep:  Good   Screenings: GAD-7     Virtual BH Phone Follow Up from 05/25/2017 in [place]  Family Medicine  Total GAD-7 Score  13    PHQ2-9     Office Visit from 10/03/2018 in [facility] Office Visit from 07/04/2018 in [place] [facility] from 04/23/2018 in [place] Visit from 01/29/2018 in [facility] Office Visit from 08/31/2017 in [facility]  PHQ-2 Total Score  3  1  3   0  2  PHQ-9 Total Score  3  2  17   --  7       Assessment and Plan: This patient is a 58 year old female with a history of anxiety and difficulty sleeping.  She is doing very well on her current regimen.  She will continue Xanax 1 mg up to 3 times daily for anxiety and Valium 5 mg only at bedtime as needed for sleep.  She will return to see me in 4 months   Levonne Spiller, MD 02/14/2019, 2:10 PM

## 2019-02-19 ENCOUNTER — Ambulatory Visit (INDEPENDENT_AMBULATORY_CARE_PROVIDER_SITE_OTHER): Payer: Medicare Other | Admitting: Family Medicine

## 2019-02-19 ENCOUNTER — Telehealth: Payer: Self-pay | Admitting: *Deleted

## 2019-02-19 ENCOUNTER — Encounter: Payer: Self-pay | Admitting: Family Medicine

## 2019-02-19 ENCOUNTER — Other Ambulatory Visit: Payer: Self-pay

## 2019-02-19 VITALS — BP 122/80 | HR 69 | Temp 98.4°F | Resp 15 | Ht 67.0 in | Wt 243.0 lb

## 2019-02-19 DIAGNOSIS — I10 Essential (primary) hypertension: Secondary | ICD-10-CM | POA: Diagnosis not present

## 2019-02-19 DIAGNOSIS — F418 Other specified anxiety disorders: Secondary | ICD-10-CM | POA: Diagnosis not present

## 2019-02-19 DIAGNOSIS — K219 Gastro-esophageal reflux disease without esophagitis: Secondary | ICD-10-CM

## 2019-02-19 DIAGNOSIS — K429 Umbilical hernia without obstruction or gangrene: Secondary | ICD-10-CM

## 2019-02-19 DIAGNOSIS — M349 Systemic sclerosis, unspecified: Secondary | ICD-10-CM

## 2019-02-19 DIAGNOSIS — F5105 Insomnia due to other mental disorder: Secondary | ICD-10-CM

## 2019-02-19 DIAGNOSIS — Z0001 Encounter for general adult medical examination with abnormal findings: Secondary | ICD-10-CM | POA: Diagnosis not present

## 2019-02-19 DIAGNOSIS — M329 Systemic lupus erythematosus, unspecified: Secondary | ICD-10-CM

## 2019-02-19 NOTE — Assessment & Plan Note (Signed)
is encouraged to maintain a well balanced diet that is low in salt. Controlled, continue current medication regimen.  No refills needed.  Additionally, she is also reminded that exercise is beneficial for heart health and control of  Blood pressure. 30-60 minutes daily is recommended-walking was suggested.

## 2019-02-19 NOTE — Assessment & Plan Note (Signed)
Sees Dr Harrington Challenger, [place] 0 today. Reports feeling and doing well.

## 2019-02-19 NOTE — Assessment & Plan Note (Signed)
Improved by 4 pounds from Oct  Jenna Woods is re-educated about the importance of exercise daily to help with weight management. A minumum of 30 minutes daily is recommended. Additionally, importance of healthy food choices  with portion control discussed.   Wt Readings from Last 3 Encounters:  02/19/19 243 lb (110.2 kg)  01/24/19 246 lb (111.6 kg)  01/04/19 247 lb (112 kg)

## 2019-02-19 NOTE — Progress Notes (Signed)
Health Maintenance reviewed -   Immunization History  Administered Date(s) Administered  . Influenza Split 01/10/2012  . Influenza Whole 12/04/2009, 12/29/2010  . Influenza,inj,Quad PF,6+ Mos 11/29/2012, 10/28/2013, 11/18/2014, 12/15/2015, 12/20/2016  . PPD Test 11/09/2011  . Tdap 12/29/2010   Last Pap smear: Due  Last mammogram: 03/13/2018 Last colonoscopy: Due 07/28/2026 Last DEXA: Due at age 58 years old Dentist: 03/28/2019 next appt  Ophtho: vision good today-03/2018 Exercise: walk 30-60 minutes daily   Other doctors caring for patient include:  Patient Care Team: Fayrene Helper, MD as PCP - General Cloria Spring, MD as Consulting Physician ([place])  End of Life Discussion:  Patient has a living will and medical power of attorney    Code Status: Prior   Subjective:   HPI  Jenna Woods is a 58 y.o. female who presents for annual wellness visit and follow-up on chronic medical conditions.  She has the following concerns:outside stomach pain from recent sx she is doing well.  Great mood today.   Review Of Systems  Review of Systems  Constitutional: Negative.   HENT: Negative.   Eyes: Negative.   Respiratory: Negative.   Cardiovascular: Negative.   Gastrointestinal: Negative.   Endocrine: Negative.   Genitourinary: Negative.   Musculoskeletal: Negative.   Skin: Negative.   Allergic/Immunologic: Negative.   Neurological: Negative.   Hematological: Negative.   Psychiatric/Behavioral: Negative.   All other systems reviewed and are negative.   Objective:   PHYSICAL EXAM:  BP 122/80   Pulse 69   Temp 98.4 F (36.9 C) (Oral)   Resp 15   Ht 5\' 7"  (1.702 m)   Wt 243 lb (110.2 kg)   SpO2 99%   BMI 38.06 kg/m   Physical Exam Vitals and nursing note reviewed.  Constitutional:      Appearance: Normal appearance. She is well-developed, well-groomed and overweight.  HENT:     Head: Normocephalic.     Right Ear: Hearing, tympanic  membrane, ear canal and external ear normal.     Left Ear: Hearing, tympanic membrane, ear canal and external ear normal.     Ears:     Weber exam findings: does not lateralize.    Right Rinne: AC > BC.    Left Rinne: AC > BC.    Nose: Nose normal.     Mouth/Throat:     Lips: Pink.     Mouth: Mucous membranes are moist.     Pharynx: Oropharynx is clear. Uvula midline.  Eyes:     Extraocular Movements: Extraocular movements intact.     Conjunctiva/sclera: Conjunctivae normal.     Pupils: Pupils are equal, round, and reactive to light.  Cardiovascular:     Rate and Rhythm: Normal rate and regular rhythm.     Pulses: Normal pulses.          Radial pulses are 2+ on the right side and 2+ on the left side.       Dorsalis pedis pulses are 2+ on the right side and 2+ on the left side.     Heart sounds: Normal heart sounds.  Pulmonary:     Effort: Pulmonary effort is normal.     Breath sounds: Normal breath sounds.  Abdominal:     General: Abdomen is flat. Bowel sounds are normal.     Palpations: Abdomen is soft.  Musculoskeletal:        General: Normal range of motion.     Cervical back: Normal range of motion  and neck supple.     Right lower leg: No edema.     Left lower leg: No edema.  Skin:    General: Skin is warm and dry.     Capillary Refill: Capillary refill takes less than 2 seconds.  Neurological:     General: No focal deficit present.     Mental Status: She is alert and oriented to person, place, and time. Mental status is at baseline.     Cranial Nerves: Cranial nerves are intact.     Sensory: Sensation is intact.     Motor: Motor function is intact.     Coordination: Coordination is intact.     Gait: Gait is intact.     Deep Tendon Reflexes: Reflexes are normal and symmetric.  Psychiatric:        Attention and Perception: Attention and perception normal.        Mood and Affect: Mood and affect normal.        Speech: Speech normal.        Behavior: Behavior normal.  Behavior is cooperative.        Thought Content: Thought content normal.        Cognition and Memory: Cognition and memory normal.        Judgment: Judgment normal.    Depression Screening  Depression screen [facility] 2/9 02/19/2019 10/03/2018 07/04/2018 04/23/2018 01/29/2018  Decreased Interest 0 0 0 1 0  Down, Depressed, Hopeless 0 3 1 2  0  PHQ - 2 Score 0 3 1 3  0  Altered sleeping - 0 1 2 -  Tired, decreased energy - 0 0 2 -  Change in appetite - 0 0 2 -  Feeling bad or failure about yourself  - 0 0 2 -  Trouble concentrating - 0 0 2 -  Moving slowly or fidgety/restless - 0 0 2 -  Suicidal thoughts - 0 0 2 -  PHQ-9 Score - 3 2 17  -  Difficult doing work/chores - - Not difficult at all Somewhat difficult -  Some recent data might be hidden        Assessment & Plan:    1. Annual visit for general adult medical examination with abnormal findings  Discussed monthly self breast exams and yearly mammograms; at least 30 minutes of aerobic activity at least 5 days/week and weight-bearing exercise 2x/week; proper sunscreen use reviewed; healthy diet, including goals of calcium and vitamin  D intake and alcohol recommendations (less than or equal to 1 drink/day) reviewed; regular seatbelt use; changing batteries in smoke detectors.  Immunization recommendations discussed.  Colonoscopy recommendations reviewed.  - CBC - COMPLETE METABOLIC PANEL WITH GFR - Hemoglobin A1c - Lipid panel - TSH - VITAMIN D 25 Hydroxy  2. Essential hypertension WINNETTE GILLES is encouraged to maintain a well balanced diet that is low in salt. Controlled, continue current medication regimen.  No refills needed.  Additionally, she is also reminded that exercise is beneficial for heart health and control of  Blood pressure. 30-60 minutes daily is recommended-walking was suggested.   - CBC - COMPLETE METABOLIC PANEL WITH GFR  3. Morbid obesity ([place]) Improved  MARIAINES SAWHILL is re-educated about the  importance of exercise daily to help with weight management. A minumum of 30 minutes daily is recommended. Additionally, importance of healthy food choices  with portion control discussed.   Wt Readings from Last 3 Encounters:  02/19/19 243 lb (110.2 kg)  01/24/19 246 lb (111.6 kg)  01/04/19 247  lb (112 kg)    - Hemoglobin A1c - Lipid panel  4. Depression with anxiety Sees Dr Harrington Challenger, reports doing well PHQ 0  5. Insomnia due to mental disorder Controlled   6. Systemic lupus erythematosus, unspecified SLE type, unspecified organ involvement status ([place]) Follow up is encouraged.   7. Scleroderma ([place]) As per notes, she wanted to get follow up this year, but COVID has postponed that.   8. Gastroesophageal reflux disease, unspecified whether esophagitis present Controlled continue Protonix   9. Umbilical hernia without obstruction and without gangrene S/p repair. Followed by General Sx.    The patient's weight, height, BMI, and visual acuity have been recorded in the chart.  I have made referrals, counseling, and provided education to the patient based on review of the above and I have provided the patient with a written personalized care plan for preventive services.     Perlie Mayo, NP   02/19/2019

## 2019-02-19 NOTE — Patient Instructions (Addendum)
  I appreciate the opportunity to provide you with care for your health and wellness. Today we discussed: overall health   Follow up: 4 months with Dr Moshe Cipro    Labs placed today- you can get first part of January (FASTING)  HEALTH MAINTENANCE RECOMMENDATIONS:  It is recommended that you get at least 30 minutes of aerobic exercise at least 5 days/week (for weight loss, you may need as much as 60-90 minutes). This can be any activity that gets your heart rate up. This can be divided in 10-15 minute intervals if needed, but try and build up your endurance at least once a week.  Weight bearing exercise is also recommended twice weekly.  Eat a healthy diet with lots of vegetables, fruits and fiber.  "Colorful" foods have a lot of vitamins (ie green vegetables, tomatoes, red peppers, etc).  Limit sweet tea, regular sodas and alcoholic beverages, all of which has a lot of calories and sugar.  Up to 1 alcoholic drink daily may be beneficial for women (unless trying to lose weight, watch sugars).  Drink a lot of water.  Calcium recommendations are 1200-1500 mg daily (1500 mg for postmenopausal women or women without ovaries), and vitamin D 1000 IU daily.  This should be obtained from diet and/or supplements (vitamins), and calcium should not be taken all at once, but in divided doses.  Monthly self breast exams and yearly mammograms for women over the age of 30 is recommended.  Sunscreen of at least SPF 30 should be used on all sun-exposed parts of the skin when outside between the hours of 10 am and 4 pm (not just when at beach or pool, but even with exercise, golf, tennis, and yard work!)  Use a sunscreen that says "broad spectrum" so it covers both UVA and UVB rays, and make sure to reapply every 1-2 hours.  Remember to change the batteries in your smoke detectors when changing your clock times in the spring and fall.  Use your seat belt every time you are in a car, and please drive safely and not  be distracted with cell phones and texting while driving.  I hope you have a wonderful, happy, safe, and healthy Holiday Season! See you in the New Year :)  Please continue to practice social distancing to keep you, your family, and our community safe.  If you must go out, please wear a mask and practice good handwashing.  It was a pleasure to see you and I look forward to continuing to work together on your health and well-being. Please do not hesitate to call the office if you need care or have questions about your care.  Have a wonderful day and week. With Gratitude, Cherly Beach, DNP, AGNP-BC

## 2019-02-19 NOTE — Telephone Encounter (Signed)
Pt travel forms received via walkin  Copied Noted Sleeved

## 2019-02-19 NOTE — Assessment & Plan Note (Signed)
Reports she is doing well. Does not take sleep medication.

## 2019-02-25 ENCOUNTER — Encounter: Payer: Self-pay | Admitting: Family Medicine

## 2019-02-25 ENCOUNTER — Other Ambulatory Visit: Payer: Self-pay

## 2019-02-25 ENCOUNTER — Ambulatory Visit (INDEPENDENT_AMBULATORY_CARE_PROVIDER_SITE_OTHER): Payer: Medicare Other | Admitting: Family Medicine

## 2019-02-25 VITALS — BP 124/86 | HR 100 | Temp 99.1°F | Resp 15 | Ht 67.0 in | Wt 241.1 lb

## 2019-02-25 DIAGNOSIS — H6123 Impacted cerumen, bilateral: Secondary | ICD-10-CM | POA: Insufficient documentation

## 2019-02-25 NOTE — Assessment & Plan Note (Signed)
Bilateral fluid irrigation and lavage with instrumentation.  Both tympanic membranes were not visualized prior to procedure.  Informed consent was gained.  Post irrigation lavage with instrumentation right TM was slightly injected.  Partial tympanic membrane noted on the left no injection seen.  Patient tolerated procedure well.

## 2019-02-25 NOTE — Patient Instructions (Signed)
  I appreciate the opportunity to provide you with care for your health and wellness. Today we discussed: ear wax removal and ear flushing   Follow up: as scheduled   No labs or referrals today  I hope you have a wonderful, happy, safe, and healthy Holiday Season! See you in the New Year :)  Please continue to practice social distancing to keep you, your family, and our community safe.  If you must go out, please wear a mask and practice good handwashing.  It was a pleasure to see you and I look forward to continuing to work together on your health and well-being. Please do not hesitate to call the office if you need care or have questions about your care.  Have a wonderful day and week. With Gratitude, Cherly Beach, DNP, AGNP-BC

## 2019-02-25 NOTE — Progress Notes (Signed)
Subjective:  Patient ID: Jenna Woods, female    DOB: 1960/06/09  Age: 58 y.o. MRN: [id number]  CC:  Chief Complaint  Patient presents with  . impacted cerumen      HPI  In today for bilateral ear flushing and irrigation lavage with instrumentation. Had recent physical and was noted to have cerumen impaction that required an additional appointment to accommodate time for this. Denies having any dizziness, headaches, sinus symptoms, ear pain, sore throat or any other signs or symptoms of possible infection.  Reports herself that she started using a little bit of peroxide to help meet with the wax to make it easier for [place] to flush today.  Denies using Q-tips or any other items in the ear.   Today patient denies signs and symptoms of COVID 19 infection including fever, chills, cough, shortness of breath, and headache. Past Medical, Surgical, Social History, Allergies, and Medications have been Reviewed.   Past Medical History:  Diagnosis Date  . Allergic reaction   . Allergy    perrenial   . Anxiety   . Arthritis   . Depression    h/o suicidal ideation in 2011  . Depression with anxiety   . Diverticulosis of colon 12/19/2015  . Diverticulosis of colon with hemorrhage 12/19/2015  . GERD (gastroesophageal reflux disease)   . Hypertension 1996  . Obesity   . Peripheral vascular disease ([place])   . Prediabetes 2013  . Raynauds syndrome   . Scleroderma ([place]) 1998  . Systemic lupus erythematosus ([place]) 1998   Treated at [facility]  . Tobacco abuse, in remission    10-pack-years discontinued in 2007  . Urinary frequency     Current Meds  Medication Sig  . ALPRAZolam (XANAX) 1 MG tablet Take 1 tablet (1 mg total) by mouth 3 (three) times daily.  Marland Kitchen amLODipine (NORVASC) 10 MG tablet Take 1 tablet (10 mg total) by mouth daily.  . Ascorbic Acid (VITAMIN C) 500 MG CAPS Take 500 mg by mouth daily.  . Calcium Carbonate-Vit D-Min (CALCIUM 1200) 1200-1000 MG-UNIT CHEW Chew 1  tablet by mouth daily.  . Cholecalciferol (VITAMIN D) 2000 units CAPS Omne daily  . diazepam (VALIUM) 5 MG tablet TAKE 1 TABLET(5 MG) BY MOUTH AT BEDTIME  . docusate sodium (COLACE) 100 MG capsule Take 1 capsule (100 mg total) by mouth 2 (two) times daily.  . ferrous sulfate ([employer] IRON) 325 (65 FE) MG tablet Take 325 mg by mouth 1 day or 1 dose.  . mirabegron ER (MYRBETRIQ) 25 MG TB24 tablet Take 1 tablet (25 mg total) by mouth daily.  . ondansetron (ZOFRAN) 4 MG tablet Take 1 tablet (4 mg total) by mouth every 8 (eight) hours as needed for nausea or vomiting.  . pantoprazole (PROTONIX) 40 MG tablet Take 1 tablet (40 mg total) by mouth daily.  . traMADol (ULTRAM) 50 MG tablet Take 1 tablet (50 mg total) by mouth every 6 (six) hours as needed for severe pain.    ROS:  Review of Systems  Constitutional: Negative.   HENT: Negative.   Respiratory: Negative.   Cardiovascular: Negative.   All other systems reviewed and are negative.    Objective:   Today's Vitals: BP 124/86   Pulse 100   Temp 99.1 F (37.3 C) (Oral)   Resp 15   Ht 5\' 7"  (1.702 m)   Wt 241 lb 1.3 oz (109.4 kg)   SpO2 96%   BMI 37.76 kg/m  Vitals with BMI  02/25/2019 02/19/2019 01/24/2019  Height 5\' 7"  5\' 7"  5\' 7"   Weight 241 lbs 1 oz 243 lbs 246 lbs  BMI 37.75 Q000111Q Q000111Q  Systolic [id number]  Diastolic 86 80 88  Pulse 123XX123 69 93  Some encounter information is confidential and restricted. Go to Review Flowsheets activity to see all data.     Physical Exam Vitals and nursing note reviewed.  Constitutional:      Appearance: Normal appearance. She is well-developed and well-groomed. She is obese.  HENT:     Head: Normocephalic and atraumatic.     Right Ear: External ear normal. There is impacted cerumen. Tympanic membrane is injected.     Left Ear: External ear normal. There is impacted cerumen.     Nose: Nose normal.     Mouth/Throat:     Mouth: Mucous membranes are moist.     Pharynx: Oropharynx is clear.    Eyes:     General:        Right eye: No discharge.        Left eye: No discharge.     Conjunctiva/sclera: Conjunctivae normal.  Cardiovascular:     Rate and Rhythm: Normal rate and regular rhythm.     Pulses: Normal pulses.     Heart sounds: Normal heart sounds.  Pulmonary:     Effort: Pulmonary effort is normal.     Breath sounds: Normal breath sounds.  Musculoskeletal:        General: Normal range of motion.     Cervical back: Normal range of motion and neck supple.  Skin:    General: Skin is warm.  Neurological:     General: No focal deficit present.     Mental Status: She is alert and oriented to person, place, and time.  Psychiatric:        Attention and Perception: Attention normal.        Mood and Affect: Mood normal.        Speech: Speech normal.        Behavior: Behavior normal. Behavior is cooperative.        Thought Content: Thought content normal.        Cognition and Memory: Cognition normal.        Judgment: Judgment normal.   Procedure:  Bilateral fluid irrigation and lavage with instrumentation.  Both tympanic membranes were not visualized prior to procedure.  Informed consent was gained.  Post irrigation lavage with instrumentation right TM was slightly injected.  Partial tympanic membrane noted on the left no injection seen.  Patient tolerated procedure well.      Assessment   1. Bilateral impacted cerumen     Tests ordered No orders of the defined types were placed in this encounter.    Plan: Please see assessment and plan per problem list above.   No orders of the defined types were placed in this encounter.   Patient to follow-up in 10.  Perlie Mayo, NP

## 2019-03-12 ENCOUNTER — Other Ambulatory Visit: Payer: Self-pay

## 2019-03-12 MED ORDER — AMLODIPINE BESYLATE 10 MG PO TABS: 10.0000 mg | ORAL_TABLET | Freq: Every day | ORAL | 1 refills | Status: DC

## 2019-03-18 ENCOUNTER — Telehealth: Payer: Self-pay | Admitting: *Deleted

## 2019-03-18 ENCOUNTER — Other Ambulatory Visit: Payer: Self-pay | Admitting: Family Medicine

## 2019-03-18 DIAGNOSIS — Z1231 Encounter for screening mammogram for malignant neoplasm of breast: Secondary | ICD-10-CM

## 2019-03-18 NOTE — Telephone Encounter (Signed)
Pt wanted to have results from last mammogram from [facility] around January of last year sent to the breast center in [place]

## 2019-03-18 NOTE — Telephone Encounter (Signed)
Results faxed per patient request.

## 2019-04-01 DIAGNOSIS — Z79899 Other long term (current) drug therapy: Secondary | ICD-10-CM | POA: Diagnosis not present

## 2019-04-01 DIAGNOSIS — I10 Essential (primary) hypertension: Secondary | ICD-10-CM | POA: Diagnosis not present

## 2019-04-01 DIAGNOSIS — Z0001 Encounter for general adult medical examination with abnormal findings: Secondary | ICD-10-CM | POA: Diagnosis not present

## 2019-04-01 DIAGNOSIS — E559 Vitamin D deficiency, unspecified: Secondary | ICD-10-CM | POA: Diagnosis not present

## 2019-04-02 LAB — COMPLETE METABOLIC PANEL WITH GFR
AG Ratio: 1.6 (calc) (ref 1.0–2.5)
ALT: 12 U/L (ref 6–29)
AST: 12 U/L (ref 10–35)
Albumin: 4.1 g/dL (ref 3.6–5.1)
Alkaline phosphatase (APISO): 67 U/L (ref 37–153)
BUN: 12 mg/dL (ref 7–25)
CO2: 28 mmol/L (ref 20–32)
Calcium: 9.2 mg/dL (ref 8.6–10.4)
Chloride: 105 mmol/L (ref 98–110)
Creat: 0.72 mg/dL (ref 0.50–0.99)
GFR, Est African American: 105 mL/min/{1.73_m2} (ref >=60)
GFR, Est Non African American: 91 mL/min/{1.73_m2} (ref >=60)
Globulin: 2.5 g/dL (calc) (ref 1.9–3.7)
Glucose, Bld: 118 mg/dL — ABNORMAL HIGH (ref 65–99)
Potassium: 3.7 mmol/L (ref 3.5–5.3)
Sodium: 141 mmol/L (ref 135–146)
Total Bilirubin: 0.3 mg/dL (ref 0.2–1.2)
Total Protein: 6.6 g/dL (ref 6.1–8.1)

## 2019-04-02 LAB — LIPID PANEL
Cholesterol: 145 mg/dL (ref <200)
HDL: 50 mg/dL (ref >=50)
LDL Cholesterol (Calc): 78 mg/dL (calc)
Non-HDL Cholesterol (Calc): 95 mg/dL (calc) (ref <130)
Total CHOL/HDL Ratio: 2.9 (calc) (ref <5.0)
Triglycerides: 91 mg/dL (ref <150)

## 2019-04-02 LAB — HEMOGLOBIN A1C
Hgb A1c MFr Bld: 5.9 % of total Hgb — ABNORMAL HIGH (ref <5.7)
Mean Plasma Glucose: 123 (calc)
eAG (mmol/L): 6.8 (calc)

## 2019-04-02 LAB — CBC
HCT: 40 % (ref 35.0–45.0)
Hemoglobin: 13.1 g/dL (ref 11.7–15.5)
MCH: 29.8 pg (ref 27.0–33.0)
MCHC: 32.8 g/dL (ref 32.0–36.0)
MCV: 90.9 fL (ref 80.0–100.0)
MPV: 9.7 fL (ref 7.5–12.5)
Platelets: 366 10*3/uL (ref 140–400)
RBC: 4.4 10*6/uL (ref 3.80–5.10)
RDW: 12.7 % (ref 11.0–15.0)
WBC: 9.1 10*3/uL (ref 3.8–10.8)

## 2019-04-02 LAB — TSH: TSH: 1.71 mIU/L (ref 0.40–4.50)

## 2019-04-02 LAB — VITAMIN D 25 HYDROXY (VIT D DEFICIENCY, FRACTURES): Vit D, 25-Hydroxy: 48 ng/mL (ref 30–100)

## 2019-04-10 ENCOUNTER — Ambulatory Visit
Admission: RE | Admit: 2019-04-10 | Discharge: 2019-04-10 | Disposition: A | Discharge status: Home / Self Care | Payer: Medicare Other | Source: Ambulatory Visit | Attending: Family Medicine | Admitting: Family Medicine

## 2019-04-10 ENCOUNTER — Other Ambulatory Visit: Payer: Self-pay

## 2019-04-10 DIAGNOSIS — Z1231 Encounter for screening mammogram for malignant neoplasm of breast: Secondary | ICD-10-CM

## 2019-04-16 ENCOUNTER — Encounter: Payer: Self-pay | Admitting: Adult Health

## 2019-04-16 ENCOUNTER — Other Ambulatory Visit: Payer: Self-pay

## 2019-04-16 ENCOUNTER — Ambulatory Visit (INDEPENDENT_AMBULATORY_CARE_PROVIDER_SITE_OTHER): Payer: Medicare Other | Admitting: Adult Health

## 2019-04-16 VITALS — BP 116/80 | HR 97 | Ht 67.0 in | Wt 245.0 lb

## 2019-04-16 DIAGNOSIS — Z01419 Encounter for gynecological examination (general) (routine) without abnormal findings: Secondary | ICD-10-CM | POA: Diagnosis not present

## 2019-04-16 DIAGNOSIS — N816 Rectocele: Secondary | ICD-10-CM | POA: Insufficient documentation

## 2019-04-16 DIAGNOSIS — N8111 Cystocele, midline: Secondary | ICD-10-CM | POA: Diagnosis not present

## 2019-04-16 DIAGNOSIS — R350 Frequency of micturition: Secondary | ICD-10-CM | POA: Insufficient documentation

## 2019-04-16 DIAGNOSIS — Z1212 Encounter for screening for malignant neoplasm of rectum: Secondary | ICD-10-CM | POA: Diagnosis not present

## 2019-04-16 DIAGNOSIS — Z1211 Encounter for screening for malignant neoplasm of colon: Secondary | ICD-10-CM | POA: Insufficient documentation

## 2019-04-16 LAB — HEMOCCULT GUIAC POC 1CARD (OFFICE): Fecal Occult Blood, POC: NEGATIVE

## 2019-04-16 NOTE — Progress Notes (Signed)
Patient ID: Jenna Woods, female   DOB: 25-Jun-1960, 59 y.o.   MRN: [id number] History of Present Illness:  Jenna Woods is a 59 year old black female, single, sp hysterectomy in for pelvic exam PCP is Dr Moshe Cipro.  Current Medications, Allergies, Past Medical History, Past Surgical History, Family History and Social History were reviewed in [employer] record.     Review of Systems:  Patient denies any headaches, hearing loss, fatigue, blurred vision, shortness of breath, chest pain, abdominal pain, problems with bowel movements,  or intercourse(not active). No joint pain or mood swings. She has urinary frequency and some pressure, felt better after umbilical hernia repair for about 2 weeks.   Physical Exam:BP 116/80 (BP Location: Left Arm, Patient Position: Sitting, Cuff Size: Large)   Pulse 97   Ht 5\' 7"  (1.702 m)   Wt 245 lb (111.1 kg)   BMI 38.37 kg/m  General:  Well developed, well nourished, no acute distress Skin:  Warm and dry Neck:  Midline trachea, normal thyroid, good ROM, no lymphadenopathy,no carotid bruits heard Lungs; Clear to auscultation bilaterally Cardiovascular: Regular rate and rhythm Pelvic:  External genitalia is normal in appearance, no lesions.  The vagina is normal in appearance. Urethra has no lesions or masses. The cervix and uterus are absent, has cystocele.  No adnexal masses or tenderness noted.Bladder is non tender, no masses felt. Rectal: Good sphincter tone, no polyps, or hemorrhoids felt.  Hemoccult negative.+rectocele Psych:  No mood changes, alert and cooperative,seems happy Fall risk is low PHQ 2 score is 0. Examination chaperoned by Terri Skains NP student.   Impression and Paln 1. Visit for pelvic exam Physical with PCP  Labs with PCP Mammogram yearly  No pap  2. Screening for colorectal cancer Colonoscopy per GI  3. Pelvic relaxation due to cystocele, midline Return in 1 week for pessary fitting with Dr Elonda Husky    4. Rectocele   5. Urinary frequency

## 2019-04-22 ENCOUNTER — Ambulatory Visit: Payer: Medicare Other | Admitting: Obstetrics & Gynecology

## 2019-04-25 ENCOUNTER — Ambulatory Visit: Payer: Medicare Other | Admitting: Obstetrics & Gynecology

## 2019-04-28 ENCOUNTER — Other Ambulatory Visit: Payer: Self-pay | Admitting: Family Medicine

## 2019-04-29 ENCOUNTER — Telehealth: Payer: Self-pay

## 2019-04-29 NOTE — Telephone Encounter (Signed)
Med has been sent in  

## 2019-04-29 NOTE — Telephone Encounter (Signed)
You do not need to return a call --  Please send in Acid Reflux medication

## 2019-05-06 ENCOUNTER — Ambulatory Visit: Payer: Self-pay

## 2019-05-07 ENCOUNTER — Other Ambulatory Visit: Payer: Self-pay

## 2019-05-07 ENCOUNTER — Encounter: Payer: Self-pay | Admitting: Family Medicine

## 2019-05-07 ENCOUNTER — Ambulatory Visit (INDEPENDENT_AMBULATORY_CARE_PROVIDER_SITE_OTHER): Payer: Medicare Other | Admitting: Family Medicine

## 2019-05-07 VITALS — BP 116/80 | HR 97 | Resp 15 | Ht 67.0 in | Wt 245.0 lb

## 2019-05-07 DIAGNOSIS — Z0000 Encounter for general adult medical examination without abnormal findings: Secondary | ICD-10-CM

## 2019-05-07 NOTE — Patient Instructions (Signed)
Jenna Woods , Thank you for taking time to come for your Medicare Wellness Visit. I appreciate your ongoing commitment to your health goals. Please review the following plan we discussed and let me know if I can assist you in the future.   Please continue to practice social distancing to keep you, your family, and our community safe.  If you must go out, please wear a Mask and practice good handwashing.  Screening recommendations/referrals: Colonoscopy: Due 2028 Mammogram: up to date Bone Density: up to date Recommended yearly ophthalmology/optometry visit for glaucoma screening and checkup Recommended yearly dental visit for hygiene and checkup  Vaccinations: Influenza vaccine: up to date  Pneumococcal vaccine: up to date  Tdap vaccine: up to date  Shingles vaccine: completed  Advanced directives: bring a copy when you can  Conditions/risks identified: Falls  Next appointment: 06/20/2019, 1 year for AWV  Preventive Care 40-64 Years, Female Preventive care refers to lifestyle choices and visits with your health care provider that can promote health and wellness. What does preventive care include?  A yearly physical exam. This is also called an annual well check.  Dental exams once or twice a year.  Routine eye exams. Ask your health care provider how often you should have your eyes checked.  Personal lifestyle choices, including:  Daily care of your teeth and gums.  Regular physical activity.  Eating a healthy diet.  Avoiding tobacco and drug use.  Limiting alcohol use.  Practicing safe sex.  Taking low-dose aspirin daily starting at age 32.  Taking vitamin and mineral supplements as recommended by your health care provider. What happens during an annual well check? The services and screenings done by your health care provider during your annual well check will depend on your age, overall health, lifestyle risk factors, and family history of disease. Counseling   Your health care provider may ask you questions about your:  Alcohol use.  Tobacco use.  Drug use.  Emotional well-being.  Home and relationship well-being.  Sexual activity.  Eating habits.  Work and work Statistician.  Method of birth control.  Menstrual cycle.  Pregnancy history. Screening  You may have the following tests or measurements:  Height, weight, and BMI.  Blood pressure.  Lipid and cholesterol levels. These may be checked every 5 years, or more frequently if you are over 33 years old.  Skin check.  Lung cancer screening. You may have this screening every year starting at age 5 if you have a 30-pack-year history of smoking and currently smoke or have quit within the past 15 years.  Fecal occult blood test (FOBT) of the stool. You may have this test every year starting at age 59.  Flexible sigmoidoscopy or colonoscopy. You may have a sigmoidoscopy every 5 years or a colonoscopy every 10 years starting at age 33.  Hepatitis C blood test.  Hepatitis B blood test.  Sexually transmitted disease (STD) testing.  Diabetes screening. This is done by checking your blood sugar (glucose) after you have not eaten for a while (fasting). You may have this done every 1-3 years.  Mammogram. This may be done every 1-2 years. Talk to your health care provider about when you should start having regular mammograms. This may depend on whether you have a family history of breast cancer.  BRCA-related cancer screening. This may be done if you have a family history of breast, ovarian, tubal, or peritoneal cancers.  Pelvic exam and Pap test. This may be done every 3 years  starting at age 45. Starting at age 61, this may be done every 5 years if you have a Pap test in combination with an HPV test.  Bone density scan. This is done to screen for osteoporosis. You may have this scan if you are at high risk for osteoporosis. Discuss your test results, treatment options, and if  necessary, the need for more tests with your health care provider. Vaccines  Your health care provider may recommend certain vaccines, such as:  Influenza vaccine. This is recommended every year.  Tetanus, diphtheria, and acellular pertussis (Tdap, Td) vaccine. You may need a Td booster every 10 years.  Zoster vaccine. You may need this after age 31.  Pneumococcal 13-valent conjugate (PCV13) vaccine. You may need this if you have certain conditions and were not previously vaccinated.  Pneumococcal polysaccharide (PPSV23) vaccine. You may need one or two doses if you smoke cigarettes or if you have certain conditions. Talk to your health care provider about which screenings and vaccines you need and how often you need them. This information is not intended to replace advice given to you by your health care provider. Make sure you discuss any questions you have with your health care provider. Document Released: 03/20/2015 Document Revised: 11/11/2015 Document Reviewed: 12/23/2014 Elsevier Interactive Patient Education  2017 [place] Prevention in the Home Falls can cause injuries. They can happen to people of all ages. There are many things you can do to make your home safe and to help prevent falls. What can I do on the outside of my home?  Regularly fix the edges of walkways and driveways and fix any cracks.  Remove anything that might make you trip as you walk through a door, such as a raised step or threshold.  Trim any bushes or trees on the path to your home.  Use bright outdoor lighting.  Clear any walking paths of anything that might make someone trip, such as rocks or tools.  Regularly check to see if handrails are loose or broken. Make sure that both sides of any steps have handrails.  Any raised decks and porches should have guardrails on the edges.  Have any leaves, snow, or ice cleared regularly.  Use sand or salt on walking paths during  winter.  Clean up any spills in your garage right away. This includes oil or grease spills. What can I do in the bathroom?  Use night lights.  Install grab bars by the toilet and in the tub and shower. Do not use towel bars as grab bars.  Use non-skid mats or decals in the tub or shower.  If you need to sit down in the shower, use a plastic, non-slip stool.  Keep the floor dry. Clean up any water that spills on the floor as soon as it happens.  Remove soap buildup in the tub or shower regularly.  Attach bath mats securely with double-sided non-slip rug tape.  Do not have throw rugs and other things on the floor that can make you trip. What can I do in the bedroom?  Use night lights.  Make sure that you have a light by your bed that is easy to reach.  Do not use any sheets or blankets that are too big for your bed. They should not hang down onto the floor.  Have a firm chair that has side arms. You can use this for support while you get dressed.  Do not have throw rugs and  other things on the floor that can make you trip. What can I do in the kitchen?  Clean up any spills right away.  Avoid walking on wet floors.  Keep items that you use a lot in easy-to-reach places.  If you need to reach something above you, use a strong step stool that has a grab bar.  Keep electrical cords out of the way.  Do not use floor polish or wax that makes floors slippery. If you must use wax, use non-skid floor wax.  Do not have throw rugs and other things on the floor that can make you trip. What can I do with my stairs?  Do not leave any items on the stairs.  Make sure that there are handrails on both sides of the stairs and use them. Fix handrails that are broken or loose. Make sure that handrails are as Woods as the stairways.  Check any carpeting to make sure that it is firmly attached to the stairs. Fix any carpet that is loose or worn.  Avoid having throw rugs at the top or  bottom of the stairs. If you do have throw rugs, attach them to the floor with carpet tape.  Make sure that you have a light switch at the top of the stairs and the bottom of the stairs. If you do not have them, ask someone to add them for you. What else can I do to help prevent falls?  Wear shoes that:  Do not have high heels.  Have rubber bottoms.  Are comfortable and fit you well.  Are closed at the toe. Do not wear sandals.  If you use a stepladder:  Make sure that it is fully opened. Do not climb a closed stepladder.  Make sure that both sides of the stepladder are locked into place.  Ask someone to hold it for you, if possible.  Clearly mark and make sure that you can see:  Any grab bars or handrails.  First and last steps.  Where the edge of each step is.  Use tools that help you move around (mobility aids) if they are needed. These include:  Canes.  Walkers.  Scooters.  Crutches.  Turn on the lights when you go into a dark area. Replace any light bulbs as soon as they burn out.  Set up your furniture so you have a clear path. Avoid moving your furniture around.  If any of your floors are uneven, fix them.  If there are any pets around you, be aware of where they are.  Review your medicines with your doctor. Some medicines can make you feel dizzy. This can increase your chance of falling. Ask your doctor what other things that you can do to help prevent falls. This information is not intended to replace advice given to you by your health care provider. Make sure you discuss any questions you have with your health care provider. Document Released: 12/18/2008 Document Revised: 07/30/2015 Document Reviewed: 03/28/2014 Elsevier Interactive Patient Education  2017 [employer].

## 2019-05-07 NOTE — Progress Notes (Signed)
Subjective:   Jenna Woods is a 59 y.o. female who presents for Medicare Annual (Subsequent) preventive examination.  Location of Patient: Home Location of Provider: Telehealth Consent was obtain for visit to be over via telehealth.  I verified that I am speaking with the correct person using two identifiers.  Review of Systems:    Cardiac Risk Factors include: hypertension;obesity (BMI >30kg/m2)     Objective:     Vitals: BP 116/80   Pulse 97   Resp 15   Ht 5\' 7"  (1.702 m)   Wt 245 lb (111.1 kg)   BMI 38.37 kg/m   Body mass index is 38.37 kg/m.  Advanced Directives 01/04/2019 04/23/2018 04/17/2017 03/30/2017 07/27/2016 07/25/2016 07/25/2016  Does Patient Have a Medical Advance Directive? No No Yes No Yes Yes Yes  Type of Advance Directive - - - - - Living will Living will  Does patient want to make changes to medical advance directive? - - No - Patient declined - - No - Patient declined -  Copy of [place] in Chart? - - - - - - -  Would patient like information on creating a medical advance directive? Yes (MAU/Ambulatory/Procedural Areas - Information given) Yes (MAU/Ambulatory/Procedural Areas - Information given) - No - Patient declined - - -  Pre-existing out of facility DNR order (yellow form or pink MOST form) - - - - - - -    Tobacco Social History   Tobacco Use  Smoking Status Former Smoker  . Packs/day: 0.25  . Years: 25.00  . Pack years: 6.25  . Types: Cigarettes  . Quit date: 04/07/2001  . Years since quitting: 18.0  Smokeless Tobacco Never Used  Tobacco Comment   quit 88 days ago!!     Counseling given: Yes Comment: quit 88 days ago!!   Clinical Intake:  Pre-visit preparation completed: Yes  Pain : No/denies pain Pain Score: 0-No pain     BMI - recorded: 38.37 Nutritional Status: BMI > 30  Obese Nutritional Risks: None Diabetes: No  How often do you need to have someone help you when you read instructions, pamphlets,  or other written materials from your doctor or pharmacy?: 1 - Never What is the last grade level you completed in school?: 12  Interpreter Needed?: No     Past Medical History:  Diagnosis Date  . Allergic reaction   . Allergy    perrenial   . Anxiety   . Arthritis   . Depression    h/o suicidal ideation in 2011  . Depression with anxiety   . Diverticulosis of colon 12/19/2015  . Diverticulosis of colon with hemorrhage 12/19/2015  . GERD (gastroesophageal reflux disease)   . Hypertension 1996  . Obesity   . Peripheral vascular disease ([place] )   . Prediabetes 2013  . Raynauds syndrome   . Scleroderma ([place]) 1998  . Systemic lupus erythematosus ([place]) 1998   Treated at [facility]  . Tobacco abuse, in remission    10-pack-years discontinued in 2007  . Urinary frequency    Past Surgical History:  Procedure Laterality Date  . ABDOMINAL HYSTERECTOMY  1990   Neoplasm  . CESAREAN SECTION     x2  . COLONOSCOPY  2005  . COLONOSCOPY WITH PROPOFOL N/A 08/28/2015   Dr. Laural Golden: scattered medium-mouth diverticula entire colon, external hemorrhoids  . COLONOSCOPY WITH PROPOFOL N/A 07/27/2016   Procedure: COLONOSCOPY WITH PROPOFOL;  Surgeon: Rogene [place], MD;  [facility];  Service: Endoscopy;  Laterality: N/A;  . FINGER AMPUTATION     Third finger, bilateral, distal  . INCISIONAL HERNIA REPAIR N/A 01/09/2019   Procedure: HERNIA REPAIR INCISIONAL;  Surgeon: Virl Cagey, MD;  Location: [facility];  Service: General;  Laterality: N/A;  . INSERTION OF MESH N/A 01/09/2019   Procedure: INSERTION OF MESH;  Surgeon: Virl Cagey, MD;  Location[facility];  Service: General;  Laterality: N/A;  . NASAL SINUS SURGERY  09/06/2011   Procedure: ENDOSCOPIC SINUS SURGERY;  Surgeon: Ascencion Dike, MD;  Location: [place];  Service: ENT;  Laterality: N/A;  Nasal cerebrospinal fluid leak repair with Left temporalis fascia graft and Left Ear cartilage graft  . VASCULAR SURGERY       Hands, bilaterally, [facility]; right hand partial amputation of middle finger.   Family History  Problem Relation Age of Onset  . Arthritis Mother        Rheumatoid  . Dementia Mother   . Hypertension Mother   . Cirrhosis Father   . Alcohol abuse Father   . Arthritis Maternal Grandmother   . Drug abuse Sister   . Scleroderma Brother   . Graves' disease Son   . Colon cancer Neg Hx    Social History   Socioeconomic History  . Marital status: Single    Spouse name: Not on file  . Number of children: 2  . Years of education: 67  . Highest education level: 12th grade  Occupational History  . Occupation: Art therapist: [employer]    Comment: Disability awarded in [place] Use  . Smoking status: Former Smoker    Packs/day: 0.25    Years: 25.00    Pack years: 6.25    Types: Cigarettes    Quit date: 04/07/2001    Years since quitting: 18.0  . Smokeless tobacco: Never Used  . Tobacco comment: quit 88 days ago!!  Substance and Sexual Activity  . Alcohol use: No    Alcohol/week: 0.0 standard drinks    Comment: quit in 2004  . Drug use: No    Comment: use to use pot   . Sexual activity: Not Currently    Birth control/protection: Surgical    Comment: hyst  Other Topics Concern  . Not on file  Social History Narrative   Currently living with son in [place], moving back soon    Social Determinants of Health   Financial Resource Strain:   . Difficulty of Paying Living Expenses: Not on file  Food Insecurity:   . Worried About Charity fundraiser in the Last Year: Not on file  . Ran Out of Food in the Last Year: Not on file  Transportation Needs:   . Lack of Transportation (Medical): Not on file  . Lack of Transportation (Non-Medical): Not on file  Physical Activity:   . Days of Exercise per Week: Not on file  . Minutes of Exercise per Session: Not on file  Stress:   . Feeling of Stress : Not on file  Social Connections:   .  Frequency of Communication with Friends and Family: Not on file  . Frequency of Social Gatherings with Friends and Family: Not on file  . Attends Religious Services: Not on file  . Active Member of Clubs or Organizations: Not on file  . Attends Archivist Meetings: Not on file  . Marital Status: Not on file    Outpatient Encounter Medications as of 05/07/2019  Medication Sig  . ALPRAZolam (XANAX) 1 MG tablet Take 1 tablet (1 mg total) by mouth 3 (three) times daily.  Marland Kitchen amLODipine (NORVASC) 10 MG tablet Take 1 tablet (10 mg total) by mouth daily.  . Ascorbic Acid (VITAMIN C) 500 MG CAPS Take 500 mg by mouth daily.  . Cholecalciferol (VITAMIN D) 2000 units CAPS Omne daily  . diazepam (VALIUM) 5 MG tablet TAKE 1 TABLET(5 MG) BY MOUTH AT BEDTIME  . ferrous sulfate (CVS IRON) 325 (65 FE) MG tablet Take 325 mg by mouth 1 day or 1 dose.  . mirabegron ER (MYRBETRIQ) 25 MG TB24 tablet Take 1 tablet (25 mg total) by mouth daily.  . pantoprazole (PROTONIX) 40 MG tablet TAKE 1 TABLET(40 MG) BY MOUTH DAILY   No facility-administered encounter medications on file as of 05/07/2019.    Activities of Daily Living In your present state of health, do you have any difficulty performing the following activities: 05/07/2019 01/04/2019  Hearing? N N  Vision? N N  Difficulty concentrating or making decisions? N N  Walking or climbing stairs? N N  Dressing or bathing? N N  Doing errands, shopping? N N  Preparing Food and eating ? N -  Using the Toilet? N -  In the past six months, have you accidently leaked urine? N -  Do you have problems with loss of bowel control? N -  Managing your Medications? N -  Managing your Finances? N -  Housekeeping or managing your Housekeeping? N -  Some recent data might be hidden    Patient Care Team: Fayrene Helper, MD as PCP - General Cloria Spring, MD as Consulting Physician [facility])    Assessment:   This is a routine wellness examination  for Tramaine.  Exercise Activities and Dietary recommendations Current Exercise Habits: Home exercise routine, Type of exercise: walking, Time (Minutes): 60, Frequency (Times/Week): 3, Weekly Exercise (Minutes/Week): 180, Intensity: Moderate, Exercise limited by: None identified  Goals    . Increase physical activity     Patient would like to increase her exercise to 5 days a week.     . Quit smoking / using tobacco       Fall Risk Fall Risk  05/07/2019 04/16/2019 02/25/2019 02/19/2019 12/11/2018  Falls in the past year? 0 - 0 0 0  Number falls in past yr: 0 0 0 0 0  Injury with Fall? 0 0 0 0 0  Risk for fall due to : - - - - -  Follow up - - - - -   Is the patient's home free of loose throw rugs in walkways, pet beds, electrical cords, etc?   yes      Grab bars in the bathroom? yes      Handrails on the stairs?   yes      Adequate lighting?   yes    Depression Screen PHQ 2/9 Scores 05/07/2019 04/16/2019 02/25/2019 02/19/2019  PHQ - 2 Score 0 0 0 0  PHQ- 9 Score - - - -     Cognitive Function     6CIT Screen 05/07/2019 04/23/2018 04/17/2017 04/18/2016  What Year? 0 points 0 points 0 points 0 points  What month? 0 points 0 points 0 points 0 points  What time? 0 points 0 points 0 points 0 points  Count back from 20 0 points 0 points 0 points 0 points  Months in reverse 0 points 0 points 0 points 0 points  Repeat phrase 0  points 0 points 0 points 0 points  Total Score 0 0 0 0    Immunization History  Administered Date(s) Administered  . Influenza Split 01/10/2012  . Influenza Whole 12/04/2009, 12/29/2010  . Influenza,inj,Quad PF,6+ Mos 11/29/2012, 10/28/2013, 11/18/2014, 12/15/2015, 12/20/2016, 11/28/2018  . PPD Test 11/09/2011  . Tdap 12/29/2010    Qualifies for Shingles Vaccine? completed  Screening Tests Health Maintenance  Topic Date Due  . TETANUS/TDAP  12/28/2020  . MAMMOGRAM  04/09/2021  . COLONOSCOPY  07/28/2026  . INFLUENZA VACCINE  Completed  . Hepatitis C  Screening  Completed  . HIV Screening  Completed  . PAP SMEAR-Modifier  Discontinued    Cancer Screenings: Lung: Low Dose CT Chest recommended if Age 69-80 years, 30 pack-year currently smoking OR have quit w/in 15years. Patient does not qualify. Breast:  Up to date on Mammogram? Yes   Up to date of Bone Density/Dexa? Yes Colorectal:  Due 2028  Additional Screenings:   Hepatitis C Screening: COMPLETED     Plan:       1. Encounter for Medicare annual wellness exam   I have personally reviewed and noted the following in the patient's chart:   . Medical and social history . Use of alcohol, tobacco or illicit drugs  . Current medications and supplements . Functional ability and status . Nutritional status . Physical activity . Advanced directives . List of other physicians . Hospitalizations, surgeries, and ER visits in previous 12 months . Vitals . Screenings to include cognitive, depression, and falls . Referrals and appointments  In addition, I have reviewed and discussed with patient certain preventive protocols, quality metrics, and best practice recommendations. A written personalized care plan for preventive services as well as general preventive health recommendations were provided to patient.     I provided 20 minutes of non-face-to-face time during this encounter.   Perlie Mayo, NP  05/07/2019

## 2019-05-09 ENCOUNTER — Ambulatory Visit (INDEPENDENT_AMBULATORY_CARE_PROVIDER_SITE_OTHER): Payer: Medicare Other | Admitting: Obstetrics & Gynecology

## 2019-05-09 ENCOUNTER — Other Ambulatory Visit: Payer: Self-pay

## 2019-05-09 ENCOUNTER — Encounter: Payer: Self-pay | Admitting: Obstetrics & Gynecology

## 2019-05-09 VITALS — BP 129/94 | HR 82 | Ht 67.0 in | Wt 250.0 lb

## 2019-05-09 DIAGNOSIS — N993 Prolapse of vaginal vault after hysterectomy: Secondary | ICD-10-CM | POA: Diagnosis not present

## 2019-05-09 NOTE — Progress Notes (Signed)
Chief Complaint  Patient presents with  . Pessary Fitting    Blood pressure (!) 129/94, pulse 82, height 5\' 7"  (1.702 m), weight 250 lb (113.4 kg).  Initial fitting referred from [facility] S/P hysterectomy As far as she knows no bladder surgery Exam fit for Milex ring with support #2   Jenna Woods presents today for routine follow up related to her pessary.   She uses a  She reports no vaginal discharge or vaginal bleeding.  Exam reveals no undue vaginal mucosal pressure of breakdown, no discharge and no vaginal bleeding.  The pessary is removed, cleaned and replaced without difficulty.    Jenna Woods will be sen back in 1 months for continued follow up.  Florian Buff, MD  05/09/2019 12:15 PM

## 2019-05-14 ENCOUNTER — Other Ambulatory Visit (HOSPITAL_COMMUNITY): Payer: Self-pay | Admitting: Psychiatry

## 2019-05-16 ENCOUNTER — Other Ambulatory Visit (HOSPITAL_COMMUNITY): Payer: Self-pay | Admitting: Psychiatry

## 2019-05-18 ENCOUNTER — Ambulatory Visit: Payer: Medicare Other | Attending: Internal Medicine

## 2019-05-18 DIAGNOSIS — Z23 Encounter for immunization: Secondary | ICD-10-CM

## 2019-05-18 NOTE — Progress Notes (Signed)
   Covid-19 Vaccination Clinic  Name:  Jenna Woods    MRN: [id number] DOB: 06-Sep-1960  05/18/2019  Jenna Woods was observed post Covid-19 immunization for 15 minutes without incident. She was provided with Vaccine Information Sheet and instruction to access the V-Safe system.   Jenna Woods was instructed to call 911 with any severe reactions post vaccine: Marland Kitchen Difficulty breathing  . Swelling of face and throat  . A fast heartbeat  . A bad rash all over body  . Dizziness and weakness   Immunizations Administered    Name Date Dose VIS Date Route   Moderna COVID-19 Vaccine 05/18/2019 10:53 AM 0.5 mL 02/05/2019 Intramuscular   Manufacturer: Moderna   Lot: [id number]   [id number]

## 2019-06-04 ENCOUNTER — Ambulatory Visit (INDEPENDENT_AMBULATORY_CARE_PROVIDER_SITE_OTHER): Payer: Medicare Other | Admitting: Obstetrics & Gynecology

## 2019-06-04 ENCOUNTER — Other Ambulatory Visit: Payer: Self-pay

## 2019-06-04 ENCOUNTER — Encounter: Payer: Self-pay | Admitting: Obstetrics & Gynecology

## 2019-06-04 VITALS — BP 130/93 | HR 98 | Ht 67.0 in | Wt 240.0 lb

## 2019-06-04 DIAGNOSIS — Z4689 Encounter for fitting and adjustment of other specified devices: Secondary | ICD-10-CM

## 2019-06-04 DIAGNOSIS — N993 Prolapse of vaginal vault after hysterectomy: Secondary | ICD-10-CM | POA: Diagnosis not present

## 2019-06-04 NOTE — Progress Notes (Signed)
Chief Complaint  Patient presents with  . Pessary Check    Blood pressure (!) 130/93, pulse 98, height 5\' 7"  (1.702 m), weight 240 lb (108.9 kg).  Jenna Woods presents today for routine follow up related to her pessary.   She uses a Milex ring with support #2 She reports no vaginal discharge or vaginal bleeding.  Exam reveals no undue vaginal mucosal pressure of breakdown, no discharge and no vaginal bleeding.  The pessary is removed, cleaned and replaced without difficulty.    Jenna Woods will be sen back in 3 months for continued follow up.  Florian Buff, MD  06/04/2019 11:16 AM

## 2019-06-13 ENCOUNTER — Other Ambulatory Visit (HOSPITAL_COMMUNITY): Payer: Self-pay | Admitting: Psychiatry

## 2019-06-17 ENCOUNTER — Encounter (HOSPITAL_COMMUNITY): Payer: Self-pay | Admitting: Psychiatry

## 2019-06-17 ENCOUNTER — Other Ambulatory Visit: Payer: Self-pay

## 2019-06-17 ENCOUNTER — Ambulatory Visit (INDEPENDENT_AMBULATORY_CARE_PROVIDER_SITE_OTHER): Payer: Medicare Other | Admitting: Psychiatry

## 2019-06-17 DIAGNOSIS — F411 Generalized anxiety disorder: Secondary | ICD-10-CM

## 2019-06-17 MED ORDER — ALPRAZOLAM 1 MG PO TABS: ORAL_TABLET | ORAL | 2 refills | Status: DC

## 2019-06-17 MED ORDER — ZOLPIDEM TARTRATE 10 MG PO TABS: 10.0000 mg | ORAL_TABLET | Freq: Every evening | ORAL | 2 refills | Status: DC | PRN

## 2019-06-17 NOTE — Progress Notes (Signed)
Virtual Visit via Video Note  I connected with Jenna Woods on [id number] at  2:00 PM EDT by a video enabled telemedicine application and verified that I am speaking with the correct person using two identifiers.   I discussed the limitations of evaluation and management by telemedicine and the availability of in person appointments. The patient expressed understanding and agreed to proceed    I discussed the assessment and treatment plan with the patient. The patient was provided an opportunity to ask questions and all were answered. The patient agreed with the plan and demonstrated an understanding of the instructions.   The patient was advised to call back or seek an in-person evaluation if the symptoms worsen or if the condition fails to improve as anticipated.  I provided 15 minutes of non-face-to-face time during this encounter.   Levonne Spiller, MD  [facility] MD/PA/NP OP Progress Note  0000000 [id number] PM Jenna Woods  MRN:  [id number]  Chief Complaint:  Chief Complaint    Anxiety; Follow-up     HPI: This patient is a 59 year old divorced black female who lives alone in [place].  She has 2 grown sons.  She is on disability.  The patient returns after 4 months regarding treatment of anxiety.  She states for the most part she does well but when her son does something that she does not like she gets very upset.  Right now he is taken her 49-year-old granddaughter back down to live in [place] after she has spent several months with her granddaughter and bonded with her.  Apparently the son and the baby's mother have significant conflicts and the mother also tends to drink too much according to the patient.  She tries not to get involved with all this but sometimes they draw her and.  Right now she is not sleeping well and had been using Valium for sleep but is not working.  She requests to go back to Ambien and I think this is reasonable.  The Xanax during the day has helped with her  anxiety.  She denies serious depression. Visit Diagnosis:    ICD-10-CM   1. Anxiety state  F41.1     Past Psychiatric History: none  Past Medical History:  Past Medical History:  Diagnosis Date  . Allergic reaction   . Allergy    perrenial   . Anxiety   . Arthritis   . Depression    h/o suicidal ideation in 2011  . Depression with anxiety   . Diverticulosis of colon 12/19/2015  . Diverticulosis of colon with hemorrhage 12/19/2015  . GERD (gastroesophageal reflux disease)   . Hypertension 1996  . Obesity   . Peripheral vascular disease ([place])   . Prediabetes 2013  . Raynauds syndrome   . Scleroderma ([place]) 1998  . Systemic lupus erythematosus ([place]) 1998   T[facility] at [facility]  . Tobacco abuse, in remission    10-pack-years discontinued in 2007  . Urinary frequency     Past Surgical History:  Procedure Laterality Date  . ABDOMINAL HYSTERECTOMY  1990   Neoplasm  . CESAREAN SECTION     x2  . COLONOSCOPY  2005  . COLONOSCOPY WITH PROPOFOL N/A 08/28/2015   Dr. Laural Golden: scattered medium-mouth diverticula entire colon, external hemorrhoids  . COLONOSCOPY WITH PROPOFOL N/A 07/27/2016   Procedure: COLONOSCOPY WITH PROPOFOL;  Surgeon: Rogene [place], MD;  [facility];  Service: Endoscopy;  Laterality: N/A;  . FINGER AMPUTATION     Third finger, bilateral,  distal  . INCISIONAL HERNIA REPAIR N/A 01/09/2019   Procedure: HERNIA REPAIR INCISIONAL;  Surgeon: Virl Cagey, MD;  Location: [facility];  Service: General;  Laterality: N/A;  . INSERTION OF MESH N/A 01/09/2019   Procedure: INSERTION OF MESH;  Surgeon: Virl Cagey, MD;  Location: [facility];  Service: General;  Laterality: N/A;  . NASAL SINUS SURGERY  09/06/2011   Procedure: ENDOSCOPIC SINUS SURGERY;  Surgeon: Ascencion Dike, MD;  Location: [place];  Service: ENT;  Laterality: N/A;  Nasal cerebrospinal fluid leak repair with Left temporalis fascia graft and Left Ear cartilage graft  . VASCULAR  SURGERY     Hands, bilaterally, [facility]; right hand partial amputation of middle finger.    Family Psychiatric History: see below  Family History:  Family History  Problem Relation Age of Onset  . Arthritis Mother        Rheumatoid  . Dementia Mother   . Hypertension Mother   . Cirrhosis Father   . Alcohol abuse Father   . Arthritis Maternal Grandmother   . Drug abuse Sister   . Scleroderma Brother   . Graves' disease Son   . Colon cancer Neg Hx     Social History:  Social History   Socioeconomic History  . Marital status: Single    Spouse name: Not on file  . Number of children: 2  . Years of education: 49  . Highest education level: 12th grade  Occupational History  . 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 Smoking status: Former Smoker    Packs/day: 0.25    Years: 25.00    Pack years: 6.25    Types: Cigarettes    Quit date: 04/07/2001    Years since quitting: 18.2  . Smokeless tobacco: Never Used  . Tobacco comment: quit 88 days ago!!  Substance and Sexual Activity  . Alcohol use: No    Alcohol/week: 0.0 standard drinks    Comment: quit in 2004  . Drug use: No    Comment: use to use pot   . Sexual activity: Not Currently    Birth control/protection: Surgical    Comment: hyst  Other Topics Concern  . Not on file  Social History Narrative   Currently living with son in [place], moving back soon    Social Determinants of Health   Financial Resource Strain: Low Risk   . Difficulty of Paying Living Expenses: Not hard at all  Food Insecurity: No Food Insecurity  . Worried About Charity fundraiser in the Last Year: Never true  . Ran Out of Food in the Last Year: Never true  Transportation Needs: No Transportation Needs  . Lack of Transportation (Medical): No  . Lack of Transportation (Non-Medical): No  Physical Activity: Insufficiently Active  . Days of Exercise per  Week: 4 days  . Minutes of Exercise per Session: 30 min  Stress: Stress Concern Present  . Feeling of Stress : To some extent  Social Connections: Moderately Isolated  . Frequency of Communication with Friends and Family: More than three times a week  . Frequency of Social Gatherings with Friends and Family: Once a week  . Attends Religious Services: Never  . Active Member of Clubs or Organizations: No  . Attends Archivist Meetings: Never  . Marital Status: Divorced    Allergies:  Allergies  Allergen Reactions  .  Bee Venom Anaphylaxis  . Sesame Oil Anaphylaxis and Swelling    (Sesame seed)  . Aspirin     Diverticulosis with bleed  . Molds & Smuts   . Codeine Itching and Rash    Metabolic Disorder Labs: Lab Results  Component Value Date   HGBA1C 5.9 (H) 04/01/2019   MPG 123 04/01/2019   MPG 126 09/03/2018   No results found for: PROLACTIN Lab Results  Component Value Date   CHOL 145 04/01/2019   TRIG 91 04/01/2019   HDL 50 04/01/2019   CHOLHDL 2.9 04/01/2019   VLDL 15 06/30/2016   LDLCALC 78 04/01/2019   LDLCALC 68 09/03/2018   Lab Results  Component Value Date   TSH 1.71 04/01/2019   TSH 0.64 01/29/2018    Therapeutic Level Labs: No results found for: LITHIUM No results found for: VALPROATE No components found for:  CBMZ  Current Medications: Current Outpatient Medications  Medication Sig Dispense Refill  . ALPRAZolam (XANAX) 1 MG tablet TAKE 1 TABLET(1 MG) BY MOUTH THREE TIMES DAILY 90 tablet 2  . amLODipine (NORVASC) 10 MG tablet Take 1 tablet (10 mg total) by mouth daily. 90 tablet 1  . Ascorbic Acid (VITAMIN C) 500 MG CAPS Take 500 mg by mouth daily.    . Cholecalciferol (VITAMIN D) 2000 units CAPS Omne daily 30 capsule   . ferrous sulfate (CVS IRON) 325 (65 FE) MG tablet Take 325 mg by mouth 1 day or 1 dose. 60 tablet 3  . mirabegron ER (MYRBETRIQ) 25 MG TB24 tablet Take 1 tablet (25 mg total) by mouth daily. 30 tablet 5  . pantoprazole  (PROTONIX) 40 MG tablet TAKE 1 TABLET(40 MG) BY MOUTH DAILY 90 tablet 1  . zolpidem (AMBIEN) 10 MG tablet Take 1 tablet (10 mg total) by mouth at bedtime as needed for sleep. 30 tablet 2   No current facility-administered medications for this visit.     Musculoskeletal: Strength & Muscle Tone: within normal limits Gait & Station: normal Patient leans: N/A  Psychiatric Specialty Exam: Review of Systems  Psychiatric/Behavioral: Positive for sleep disturbance. The patient is nervous/anxious.   All other systems reviewed and are negative.   There were no vitals taken for this visit.There is no height or weight on file to calculate BMI.  General Appearance: Casual and Fairly Groomed  Eye Contact:  Good  Speech:  Normal Rate  Volume:  Normal  Mood:  Anxious  Affect:  Appropriate  Thought Process:  Goal Directed  Orientation:  Full (Time, Place, and Person)  Thought Content: WDL   Suicidal Thoughts:  No  Homicidal Thoughts:  No  Memory:  Immediate;   Good Recent;   Good Remote;   Good  Judgement:  Good  Insight:  Good  Psychomotor Activity:  Normal  Concentration:  Concentration: Good and Attention Span: Good  Recall:  Good  Fund of Knowledge: Good  Language: Good  Akathisia:  No  Handed:  Right  AIMS (if indicated): not done  Assets:  Communication Skills Desire for Improvement Physical Health Resilience Social Support Talents/Skills  ADL's:  Intact  Cognition: WNL  Sleep:  Poor   Screenings: GAD-7     Office Visit from 06/04/2019 in [facility] [place] Phone Follow Up from 05/25/2017 in [facility]  Total GAD-7 Score  15  13    PHQ2-9     Office Visit from 06/04/2019 in [facility] from 05/07/2019 in [facility] Office Visit from 04/16/2019 in [facility] Office Visit from 02/25/2019 in [facility] Office Visit from 02/19/2019 in [facility]  PHQ-2 Total Score   5  0  0  0  0  PHQ-9 Total Score  13  --  --  --  --       Assessment and Plan: This patient is a 59 year old female with a history of anxiety and difficulty sleeping.  She is not doing well with the Valium at bedtime so we will change to Ambien 10 mg nightly for sleep.  She will continue Xanax 1 mg up to 3 times daily for anxiety.  She will return to see me in 3 months   Levonne Spiller, MD 06/17/2019, 2:19 PM

## 2019-06-19 ENCOUNTER — Ambulatory Visit: Payer: Medicare Other | Attending: Internal Medicine

## 2019-06-19 DIAGNOSIS — Z23 Encounter for immunization: Secondary | ICD-10-CM

## 2019-06-19 NOTE — Progress Notes (Signed)
   Covid-19 Vaccination Clinic  Name:  Jenna Woods    MRN: [id number] DOB: [birth year]  06/19/2019  Ms. Wait was observed post Covid-19 immunization for 30 minutes based on pre-vaccination screening without incident. She was provided with Vaccine Information Sheet and instruction to access the V-Safe system.   [name] was instructed to call 911 with any severe reactions post vaccine: Marland Kitchen Difficulty breathing  . Swelling of face and throat  . A fast heartbeat  . A bad rash all over body  . Dizziness and weakness   Immunizations Administered    Name Date Dose VIS Date Route   Moderna COVID-19 Vaccine 06/19/2019 10:33 AM 0.5 mL 02/05/2019 Intramuscular   Manufacturer: Moderna   Lot: [id number]   [id number]

## 2019-06-20 ENCOUNTER — Ambulatory Visit: Payer: Medicare Other | Admitting: Family Medicine

## 2019-06-28 ENCOUNTER — Other Ambulatory Visit: Payer: Self-pay | Admitting: Family Medicine

## 2019-07-14 ENCOUNTER — Other Ambulatory Visit (HOSPITAL_COMMUNITY): Payer: Self-pay | Admitting: Psychiatry

## 2019-07-18 ENCOUNTER — Telehealth: Payer: Self-pay

## 2019-07-18 NOTE — Telephone Encounter (Signed)
Pt called and asked for sticker for her car.  Shana mailed it 5/12 - Patient notified

## 2019-09-03 ENCOUNTER — Ambulatory Visit (INDEPENDENT_AMBULATORY_CARE_PROVIDER_SITE_OTHER): Payer: Medicare Other | Admitting: Obstetrics & Gynecology

## 2019-09-03 ENCOUNTER — Encounter: Payer: Self-pay | Admitting: Obstetrics & Gynecology

## 2019-09-03 VITALS — BP 130/92 | HR 88 | Ht 67.0 in | Wt 242.0 lb

## 2019-09-03 DIAGNOSIS — Z4689 Encounter for fitting and adjustment of other specified devices: Secondary | ICD-10-CM | POA: Diagnosis not present

## 2019-09-03 DIAGNOSIS — N993 Prolapse of vaginal vault after hysterectomy: Secondary | ICD-10-CM | POA: Diagnosis not present

## 2019-09-03 NOTE — Progress Notes (Signed)
Chief Complaint  Patient presents with  . Pessary Check    Blood pressure (!) 130/92, pulse 88, height 5\' 7"  (1.702 m), weight 242 lb (109.8 kg).  Jenna Woods presents today for routine follow up related to her pessary.   She uses a Milex ring with support #2 She reports no vaginal discharge or vaginal bleeding.  Exam reveals no undue vaginal mucosal pressure of breakdown, no discharge and no vaginal bleeding.  The pessary is removed, cleaned and replaced without difficulty.    Jenna Woods will be sen back in 4 months for continued follow up.  Florian Buff, MD  09/03/2019 11:33 AM

## 2019-09-06 ENCOUNTER — Other Ambulatory Visit (HOSPITAL_COMMUNITY): Payer: Self-pay | Admitting: Psychiatry

## 2019-09-11 ENCOUNTER — Encounter (HOSPITAL_COMMUNITY): Payer: Self-pay | Admitting: Psychiatry

## 2019-09-11 ENCOUNTER — Other Ambulatory Visit: Payer: Self-pay

## 2019-09-11 ENCOUNTER — Telehealth (INDEPENDENT_AMBULATORY_CARE_PROVIDER_SITE_OTHER): Payer: Medicare Other | Admitting: Psychiatry

## 2019-09-11 DIAGNOSIS — F331 Major depressive disorder, recurrent, moderate: Secondary | ICD-10-CM

## 2019-09-11 DIAGNOSIS — F411 Generalized anxiety disorder: Secondary | ICD-10-CM

## 2019-09-11 MED ORDER — ALPRAZOLAM 1 MG PO TABS: ORAL_TABLET | ORAL | 2 refills | Status: DC

## 2019-09-11 MED ORDER — ZOLPIDEM TARTRATE 10 MG PO TABS: ORAL_TABLET | ORAL | 2 refills | Status: DC

## 2019-09-11 NOTE — Progress Notes (Signed)
Virtual Visit via Telephone Note  I connected with Jenna Woods on 99/37/16 at  4:30 PM EDT by telephone and verified that I am speaking with the correct person using two identifiers.   I discussed the limitations, risks, security and privacy concerns of performing an evaluation and management service by telephone and the availability of in person appointments. I also discussed with the patient that there may be a patient responsible charge related to this service. The patient expressed understanding and agreed to proceed.     I discussed the assessment and treatment plan with the patient. The patient was provided an opportunity to ask questions and all were answered. The patient agreed with the plan and demonstrated an understanding of the instructions.   The patient was advised to call back or seek an in-person evaluation if the symptoms worsen or if the condition fails to improve as anticipated.  I provided 15 minutes of non-face-to-face time during this encounter. Location: Provider office, patient home  Levonne Spiller, MD  [facility] MD/PA/NP OP Progress Note  11/11/7891 8:10 PM Jenna Woods  MRN:  [id number]  Chief Complaint:  Chief Complaint    Anxiety; Follow-up     HPI: This patient is a 59 year old divorced black female who lives alone in [place].  She has 3 grown sons and several grandchildren.  She is on disability.  The patient returns after 3 months regarding her treatment of anxiety.  She states most the time she does well but one of her sons is quite neglectful and this makes her unhappy.  She does spend time with her other son and her grandchildren.  She has made new friends in her apartment building and she is attending a church.  She has gotten the coronavirus vaccine.  She is sleeping much better with the Ambien and the Xanax during the day has helped with her anxiety.  She denies significant depression or suicidal ideation Visit Diagnosis:    ICD-10-CM   1.  Anxiety state  F41.1   2. Major depressive disorder, recurrent episode, moderate (HCC)  F33.1     Past Psychiatric History: none  Past Medical History:  Past Medical History:  Diagnosis Date  . Allergic reaction   . Allergy    perrenial   . Anxiety   . Arthritis   . Depression    h/o suicidal ideation in 2011  . Depression with anxiety   . Diverticulosis of colon 12/19/2015  . Diverticulosis of colon with hemorrhage 12/19/2015  . GERD (gastroesophageal reflux disease)   . Hypertension 1996  . Obesity   . Peripheral vascular disease ([place])   . Prediabetes 2013  . Raynauds syndrome   . Scleroderma ([place]) 1998  . Systemic lupus erythematosus ([place]) 1998   Treated at [facility]  . Tobacco abuse, in remission    10-pack-years discontinued in 2007  . Urinary frequency     Past Surgical History:  Procedure Laterality Date  . ABDOMINAL HYSTERECTOMY  1990   Neoplasm  . CESAREAN SECTION     x2  . COLONOSCOPY  2005  . COLONOSCOPY WITH PROPOFOL N/A 08/28/2015   Dr. Laural Golden: scattered medium-mouth diverticula entire colon, external hemorrhoids  . COLONOSCOPY WITH PROPOFOL N/A 07/27/2016   Procedure: COLONOSCOPY WITH PROPOFOL;  Surgeon: Rogene Houston, MD;  Location: [facility];  Service: Endoscopy;  Laterality: N/A;  . FINGER AMPUTATION     Third finger, bilateral, distal  . INCISIONAL HERNIA REPAIR N/A 01/09/2019   Procedure: HERNIA REPAIR INCISIONAL;  Surgeon: Virl Cagey, MD;  Location: [facility] ORS;  Service: General;  Laterality: N/A;  . INSERTION OF MESH N/A 01/09/2019   Procedure: INSERTION OF MESH;  Surgeon: Virl Cagey, MD;  Location: [facility];  Service: General;  Laterality: N/A;  . NASAL SINUS SURGERY  09/06/2011   Procedure: ENDOSCOPIC SINUS SURGERY;  Surgeon: Ascencion Dike, MD;  Location: [facility];  Service: ENT;  Laterality: N/A;  Nasal cerebrospinal fluid leak repair with Left temporalis fascia graft and Left Ear cartilage graft  . VASCULAR SURGERY      Hands, bilaterally, [facility]; right hand partial amputation of middle finger.    Family Psychiatric History: see below  Family History:  Family History  Problem Relation Age of Onset  . Arthritis Mother        Rheumatoid  . Dementia Mother   . Hypertension Mother   . Cirrhosis Father   . Alcohol abuse Father   . Arthritis Maternal Grandmother   . Drug abuse Sister   . Scleroderma Brother   . Graves' disease Son   . Colon cancer Neg Hx     Social History:  Social History   Socioeconomic History  . Marital status: Single    Spouse name: Not on file  . Number of children: 2  . Years of education: 30  . Highest education level: 12th grade  Occupational History  . Occupation: Art therapist: [employer]    Comment: Disability awarded in [place] Use  . Smoking status: Former Smoker    Packs/day: 0.25    Years: 25.00    Pack years: 6.25    Types: Cigarettes    Quit date: 04/07/2001    Years since quitting: 18.4  . Smokeless tobacco: Never Used  . Tobacco comment: quit 88 days ago!!  Vaping Use  . Vaping Use: Never used  Substance and Sexual Activity  . Alcohol use: No    Alcohol/week: 0.0 standard drinks    Comment: quit in 2004  . Drug use: No    Comment: use to use pot   . Sexual activity: Not Currently    Birth control/protection: Surgical    Comment: hyst  Other Topics Concern  . Not on file  Social History Narrative   Currently living with son in [place], moving back soon    Social Determinants of Health   Financial Resource Strain: Low Risk   . Difficulty of Paying Living Expenses: Not hard at all  Food Insecurity: No Food Insecurity  . Worried About Charity fundraiser in the Last Year: Never true  . Ran Out of Food in the Last Year: Never true  Transportation Needs: No Transportation Needs  . Lack of Transportation (Medical): No  . Lack of Transportation (Non-Medical): No  Physical Activity: Insufficiently  Active  . Days of Exercise per Week: 4 days  . Minutes of Exercise per Session: 30 min  Stress: Stress Concern Present  . Feeling of Stress : To some extent  Social Connections: Socially Isolated  . Frequency of Communication with Friends and Family: More than three times a week  . Frequency of Social Gatherings with Friends and Family: Once a week  . Attends Religious Services: Never  . Active Member of Clubs or Organizations: No  . Attends Archivist Meetings: Never  . Marital Status: Divorced    Allergies:  Allergies  Allergen Reactions  . Bee Venom Anaphylaxis  . Sesame  Oil Anaphylaxis and Swelling    (Sesame seed)  . Aspirin     Diverticulosis with bleed  . Molds & Smuts   . Codeine Itching and Rash    Metabolic Disorder Labs: Lab Results  Component Value Date   HGBA1C 5.9 (H) 04/01/2019   MPG 123 04/01/2019   MPG 126 09/03/2018   No results found for: PROLACTIN Lab Results  Component Value Date   CHOL 145 04/01/2019   TRIG 91 04/01/2019   HDL 50 04/01/2019   CHOLHDL 2.9 04/01/2019   VLDL 15 06/30/2016   LDLCALC 78 04/01/2019   LDLCALC 68 09/03/2018   Lab Results  Component Value Date   TSH 1.71 04/01/2019   TSH 0.64 01/29/2018    Therapeutic Level Labs: No results found for: LITHIUM No results found for: VALPROATE No components found for:  CBMZ  Current Medications: Current Outpatient Medications  Medication Sig Dispense Refill  . ALPRAZolam (XANAX) 1 MG tablet TAKE 1 TABLET(1 MG) BY MOUTH THREE TIMES DAILY 90 tablet 2  . amLODipine (NORVASC) 10 MG tablet Take 1 tablet (10 mg total) by mouth daily. 90 tablet 1  . Ascorbic Acid (VITAMIN C) 500 MG CAPS Take 500 mg by mouth daily.    . Cholecalciferol (VITAMIN D) 2000 units CAPS Omne daily 30 capsule   . ferrous sulfate ([employer]S IRON) 325 (65 FE) MG tablet Take 325 mg by mouth 1 day or 1 dose. 60 tablet 3  . MYRBETRIQ 25 MG TB24 tablet TAKE 1 TABLET(25 MG) BY MOUTH DAILY 30 tablet 5  .  pantoprazole (PROTONIX) 40 MG tablet TAKE 1 TABLET(40 MG) BY MOUTH DAILY 90 tablet 1  . STOOL SOFTENER 100 MG capsule Take 100 mg by mouth 2 (two) times daily.    Marland Kitchen zolpidem (AMBIEN) 10 MG tablet TAKE 1 TABLET(10 MG) BY MOUTH AT BEDTIME AS NEEDED FOR SLEEP 30 tablet 2   No current facility-administered medications for this visit.     Musculoskeletal: Strength & Muscle Tone: within normal limits Gait & Station: normal Patient leans: N/A  Psychiatric Specialty Exam: Review of Systems  All other systems reviewed and are negative.   There were no vitals taken for this visit.There is no height or weight on file to calculate BMI.  General Appearance: NA  Eye Contact:  NA  Speech:  Clear and Coherent  Volume:  Normal  Mood:  Euthymic  Affect:  NA  Thought Process:  Goal Directed  Orientation:  Full (Time, Place, and Person)  Thought Content: Rumination   Suicidal Thoughts:  No  Homicidal Thoughts:  No  Memory:  Immediate;   Good Recent;   Good Remote;   Good  Judgement:  Good  Insight:  Fair  Psychomotor Activity:  Normal  Concentration:  Concentration: Good and Attention Span: Good  Recall:  Good  Fund of Knowledge: Good  Language: Good  Akathisia:  No  Handed:  Right  AIMS (if indicated): not done  Assets:  Communication Skills Desire for Improvement Physical Health Resilience Social Support Talents/Skills  ADL's:  Intact  Cognition: WNL  Sleep:  Good   Screenings: GAD-7     Office Visit from 06/04/2019 in [facility] Follow Up from 05/25/2017 in [facility]  Total GAD-7 Score 15 13    PHQ2-9     Office Visit from 06/04/2019 in [facility] from 05/07/2019 in [facility] Office Visit from 04/16/2019 in [facility]  OB-GYN Office Visit from 02/25/2019 in [facility] Office Visit from 02/19/2019 in [facility]  PHQ-2 Total Score 5 0 0 0 0  PHQ-9 Total  Score 13 -- -- -- --       Assessment and Plan: This patient is a 59 year old female with a history of anxiety and difficulty sleeping.  She is doing better with Ambien 10 mg at bedtime for sleep and she will also continue Xanax 1 mg up to 3 times daily for anxiety.  She will return to see me in 3 months   Levonne Spiller, MD 09/11/2019, 4:44 PM

## 2019-09-20 ENCOUNTER — Other Ambulatory Visit: Payer: Self-pay | Admitting: Family Medicine

## 2019-09-30 ENCOUNTER — Telehealth: Payer: Self-pay | Admitting: Family Medicine

## 2019-09-30 NOTE — Telephone Encounter (Signed)
Pt called [facility] on 09/27/2019 stating that she was hurting in her stomach area and was wanting a call back. On 09/30/2019 - called and spoke to pt and she is having stomach pain along with N&V and Dr. Constance Haw told her to contact us with any problems.   She had hernia surgery 01/09/2019. I informed her that she meant post surgical and that she needed to contact her PCP for a work up as this is a new problem not related to her surgery. She states that she had also put a call into them as well.

## 2019-10-02 ENCOUNTER — Encounter: Payer: Self-pay | Admitting: Family Medicine

## 2019-10-02 ENCOUNTER — Other Ambulatory Visit: Payer: Self-pay

## 2019-10-02 ENCOUNTER — Ambulatory Visit (INDEPENDENT_AMBULATORY_CARE_PROVIDER_SITE_OTHER): Payer: Medicare Other | Admitting: Family Medicine

## 2019-10-02 ENCOUNTER — Ambulatory Visit (HOSPITAL_COMMUNITY)
Admission: RE | Admit: 2019-10-02 | Discharge: 2019-10-02 | Disposition: A | Discharge status: Home / Self Care | Payer: Medicare Other | Source: Ambulatory Visit | Attending: Family Medicine | Admitting: Family Medicine

## 2019-10-02 VITALS — BP 129/88 | HR 94 | Resp 16 | Ht 67.0 in | Wt 244.0 lb

## 2019-10-02 DIAGNOSIS — I10 Essential (primary) hypertension: Secondary | ICD-10-CM

## 2019-10-02 DIAGNOSIS — F325 Major depressive disorder, single episode, in full remission: Secondary | ICD-10-CM

## 2019-10-02 DIAGNOSIS — R112 Nausea with vomiting, unspecified: Secondary | ICD-10-CM

## 2019-10-02 DIAGNOSIS — K5900 Constipation, unspecified: Secondary | ICD-10-CM

## 2019-10-02 DIAGNOSIS — R109 Unspecified abdominal pain: Secondary | ICD-10-CM | POA: Insufficient documentation

## 2019-10-02 DIAGNOSIS — R1084 Generalized abdominal pain: Secondary | ICD-10-CM

## 2019-10-02 MED ORDER — MAGNESIUM CITRATE PO SOLN: 1.0000 | Freq: Once | ORAL | 0 refills | Status: AC

## 2019-10-02 MED ORDER — ONDANSETRON HCL 4 MG/2ML IJ SOLN
4.0000 mg | Freq: Once | INTRAMUSCULAR | Status: AC
  Administered 2019-10-02: 4 mg via INTRAMUSCULAR

## 2019-10-02 MED ORDER — POLYETHYLENE GLYCOL 3350 17 GM/SCOOP PO POWD: 17.0000 g | Freq: Every day | ORAL | 3 refills | Status: DC

## 2019-10-02 NOTE — Progress Notes (Signed)
   Jenna Woods     MRN: [id number]      DOB: February 01, 1961   HPI [name] is here with a 3 week h/o abdminal  Pain and bloating , has nausea , and vomited this morning One good BM in 3 weeks  ROS: Denies recent fever or chills. Denies sinus pressure, nasal congestion, ear pain or sore throat. Denies chest congestion, productive cough or wheezing. Denies chest pains, palpitations and leg swelling .   Denies dysuria, frequency, hesitancy . Denies joint pain, swelling and limitation in mobility. Denies headaches, seizures, numbness, or tingling. Denies uncontrolled depression, anxiety or insomnia. Denies skin break down or rash.   PE  BP (!) 129/88   Pulse 94   Resp 16   Ht '5\' 7"'$  (1.702 m)   Wt (!) 244 lb (110.7 kg)   SpO2 99%   BMI 38.22 kg/m   Patient alert and oriented and in no cardiopulmonary distress.  HEENT: No facial asymmetry, EOMI,     Neck supple .  Chest: Clear to auscultation bilaterally.  CVS: S1, S2 no murmurs, no S3.Regular rate.  ABD: Soft generalized tenderness, no guarding or rebound, decreased BS, stlool palpable throughout colon  Ext: No edema  MS: Adequate ROM spine, shoulders, hips and knees.  Skin: Intact, no ulcerations or rash noted.  Psych: Good eye contact, normal affect. Memory intact not anxious or depressed appearing.  CNS: CN 2-12 intact, power,  normal throughout.no focal deficits noted.   Assessment & Plan  Essential hypertension Controlled, no change in medication DASH diet and commitment to daily physical activity for a minimum of 30 minutes discussed and encouraged, as a part of hypertension management. The importance of attaining a healthy weight is also discussed.  BP/Weight 10/02/2019 09/03/2019 06/04/2019 05/09/2019 05/07/2019 04/16/2019 85/46/2703  Systolic BP 500 938 182 993 716 967 893  Diastolic BP 88 92 93 94 80 80 86  Wt. (Lbs) 244 242 240 250 245 245 241.08  BMI 38.22 37.9 37.59 39.16 38.37 38.37 37.76  Some  encounter information is confidential and restricted. Go to Review Flowsheets activity to see all data.       Morbid obesity ([place]) Obesity linked with hTN and depression  Patient re-educated about  the importance of commitment to a  minimum of 150 minutes of exercise per week as able.  The importance of healthy food choices with portion control discussed, as well as eating regularly and within a 12 hour window most days. The need to choose "clean , green" food 50 to 75% of the time is discussed, as well as to make water the primary drink and set a goal of 64 ounces water daily.    Weight /BMI 10/02/2019 09/03/2019 06/04/2019  WEIGHT 244 lb 242 lb 240 lb  HEIGHT '5\' 7"'$  '5\' 7"'$  '5\' 7"'$   BMI 38.22 kg/m2 37.9 kg/m2 37.59 kg/m2  Some encounter information is confidential and restricted. Go to Review Flowsheets activity to see all data.      Abdominal pain 3 week history with constipation, obtain Abd X ray, cmp and EGFR and lipase, clinical impression is this is due to constipation, no acute abdomen on exam  Constipation Daily fiber, softener and laxative every 3 days is needed, start with mg citrate, ensure adequate liquid intake  Nausea & vomiting Zofran 4 mg iM at visit  Major depression Controled and managed by Psych

## 2019-10-02 NOTE — Patient Instructions (Addendum)
F./u as before, call if you need me sooner  Zofran 4 mg IM in office today  Please get X ray of abdomen today after you leave,  cMP and eGFR today and lipase You are referred to Dr Laural Golden  New for constipation is daily miralax, take stool softenert twice daily and magnesium citrate , one dose is prescribed, if no effect use dulcolax suppository the following day, that is oTc, so is the magnesium citrate  It is important that you exercise regularly at least 30 minutes 5 times a week. If you develop chest pain, have severe difficulty breathing, or feel very tired, stop exercising immediately and seek medical attention  Think about what you will eat, plan ahead. Choose " clean, green, fresh or frozen" over canned, processed or packaged foods which are more sugary, salty and fatty. 70 to 75% of food eaten should be vegetables and fruit. Three meals at set times with snacks allowed between meals, but they must be fruit or vegetables. Aim to eat over a 12 hour period , example 7 am to 7 pm, and STOP after  your last meal of the day. Drink water,generally about 64 ounces per day, no other drink is as healthy. Fruit juice is best enjoyed in a healthy way, by EATING the fruit. Thanks for choosing [facility], we consider it a privelige to serve you.

## 2019-10-03 ENCOUNTER — Other Ambulatory Visit: Payer: Self-pay | Admitting: Family Medicine

## 2019-10-04 LAB — CMP14+EGFR
ALT: 19 IU/L (ref 0–32)
AST: 18 IU/L (ref 0–40)
Albumin/Globulin Ratio: 1.4 (ref 1.2–2.2)
Albumin: 4.2 g/dL (ref 3.8–4.8)
Alkaline Phosphatase: 107 IU/L (ref 48–121)
BUN/Creatinine Ratio: 14 (ref 12–28)
BUN: 10 mg/dL (ref 8–27)
Bilirubin Total: 0.2 mg/dL (ref 0.0–1.2)
CO2: 26 mmol/L (ref 20–29)
Calcium: 10 mg/dL (ref 8.7–10.3)
Chloride: 103 mmol/L (ref 96–106)
Creatinine, Ser: 0.72 mg/dL (ref 0.57–1.00)
GFR calc Af Amer: 105 mL/min/{1.73_m2} (ref >59)
GFR calc non Af Amer: 91 mL/min/{1.73_m2} (ref >59)
Globulin, Total: 3 g/dL (ref 1.5–4.5)
Glucose: 106 mg/dL — ABNORMAL HIGH (ref 65–99)
Potassium: 3.8 mmol/L (ref 3.5–5.2)
Sodium: 144 mmol/L (ref 134–144)
Total Protein: 7.2 g/dL (ref 6.0–8.5)

## 2019-10-04 LAB — LIPASE: Lipase: 16 U/L (ref 14–72)

## 2019-10-04 NOTE — Progress Notes (Signed)
See comment

## 2019-10-05 ENCOUNTER — Encounter: Payer: Self-pay | Admitting: Family Medicine

## 2019-10-05 NOTE — Assessment & Plan Note (Signed)
Zofran 4 mg iM at visit

## 2019-10-05 NOTE — Assessment & Plan Note (Signed)
Daily fiber, softener and laxative every 3 days is needed, start with mg citrate, ensure adequate liquid intake

## 2019-10-05 NOTE — Assessment & Plan Note (Signed)
Controlled, no change in medication DASH diet and commitment to daily physical activity for a minimum of 30 minutes discussed and encouraged, as a part of hypertension management. The importance of attaining a healthy weight is also discussed.  BP/Weight 10/02/2019 09/03/2019 06/04/2019 05/09/2019 05/07/2019 04/16/2019 55/20/8022  Systolic BP 336 122 449 753 005 110 211  Diastolic BP 88 92 93 94 80 80 86  Wt. (Lbs) 244 242 240 250 245 245 241.08  BMI 38.22 37.9 37.59 39.16 38.37 38.37 37.76  Some encounter information is confidential and restricted. Go to Review Flowsheets activity to see all data.

## 2019-10-05 NOTE — Assessment & Plan Note (Signed)
3 week history with constipation, obtain Abd X ray, cmp and EGFR and lipase, clinical impression is this is due to constipation, no acute abdomen on exam

## 2019-10-05 NOTE — Assessment & Plan Note (Signed)
Controled and managed by Psych

## 2019-10-05 NOTE — Assessment & Plan Note (Signed)
Obesity linked with hTN and depression  Patient re-educated about  the importance of commitment to a  minimum of 150 minutes of exercise per week as able.  The importance of healthy food choices with portion control discussed, as well as eating regularly and within a 12 hour window most days. The need to choose "clean , green" food 50 to 75% of the time is discussed, as well as to make water the primary drink and set a goal of 64 ounces water daily.    Weight /BMI 10/02/2019 09/03/2019 06/04/2019  WEIGHT 244 lb 242 lb 240 lb  HEIGHT 5\' 7"  5\' 7"  5\' 7"   BMI 38.22 kg/m2 37.9 kg/m2 37.59 kg/m2  Some encounter information is confidential and restricted. Go to Review Flowsheets activity to see all data.

## 2019-10-08 ENCOUNTER — Ambulatory Visit (INDEPENDENT_AMBULATORY_CARE_PROVIDER_SITE_OTHER): Payer: Medicare Other | Admitting: Family Medicine

## 2019-10-08 ENCOUNTER — Other Ambulatory Visit: Payer: Self-pay

## 2019-10-08 ENCOUNTER — Encounter: Payer: Self-pay | Admitting: Family Medicine

## 2019-10-08 ENCOUNTER — Encounter (INDEPENDENT_AMBULATORY_CARE_PROVIDER_SITE_OTHER): Payer: Self-pay | Admitting: Gastroenterology

## 2019-10-08 VITALS — BP 119/84 | HR 80 | Resp 16 | Ht 67.0 in | Wt 239.0 lb

## 2019-10-08 DIAGNOSIS — I10 Essential (primary) hypertension: Secondary | ICD-10-CM

## 2019-10-08 DIAGNOSIS — T7840XA Allergy, unspecified, initial encounter: Secondary | ICD-10-CM | POA: Diagnosis not present

## 2019-10-08 MED ORDER — HYDROXYZINE HCL 10 MG PO TABS
10.0000 mg | ORAL_TABLET | Freq: Three times a day (TID) | ORAL | 0 refills | Status: DC | PRN
Start: 2019-10-08 — End: 2019-12-19

## 2019-10-08 MED ORDER — METHYLPREDNISOLONE ACETATE 80 MG/ML IJ SUSP
80.0000 mg | Freq: Once | INTRAMUSCULAR | Status: AC
  Administered 2019-10-08: 80 mg via INTRAMUSCULAR

## 2019-10-08 MED ORDER — PREDNISONE 5 MG (21) PO TBPK
5.0000 mg | ORAL_TABLET | ORAL | 0 refills | Status: DC
Start: 2019-10-08 — End: 2019-12-19

## 2019-10-08 NOTE — Assessment & Plan Note (Signed)
Acute onset following zofran. De[po medrol 80 mg im and prednisone dose pack also hydroxyzine

## 2019-10-08 NOTE — Patient Instructions (Signed)
Follow-up as before call if you need me sooner.  You are treated for an allergic reaction to the Zofran that you received in the office last week.  This is no documented on your permanent record.  Depo-Medrol 80 mg IM followed by prednisone 5 mg Dosepak, and hydroxyzine 10 mg 1 3 times daily as needed for itching are prescribed.  If you develop shortness of breath cough or tickle in the back of your throat please go to the nearest emergency room.  Thanks for choosing [facility], we consider it a privelige to serve you. It is important that you exercise regularly at least 30 minutes 5 times a week. If you develop chest pain, have severe difficulty breathing, or feel very tired, stop exercising immediately and seek medical attention  Think about what you will eat, plan ahead. Choose " clean, green, fresh or frozen" over canned, processed or packaged foods which are more sugary, salty and fatty. 70 to 75% of food eaten should be vegetables and fruit. Three meals at set times with snacks allowed between meals, but they must be fruit or vegetables. Aim to eat over a 12 hour period , example 7 am to 7 pm, and STOP after  your last meal of the day. Drink water,generally about 64 ounces per day, no other drink is as healthy. Fruit juice is best enjoyed in a healthy way, by EATING the fruit.

## 2019-10-08 NOTE — Progress Notes (Signed)
   Jenna Woods     MRN: [id number]      DOB: 03/17/1960   HPI [name] is here with acute rash and generalized itching following Zofran IM, denies shortness of breath, or swelling of the togue ROS Denies recent fever or chills. Denies sinus pressure, nasal congestion, ear pain or sore throat. Denies chest congestion, productive cough or wheezing. Denies chest pains, palpitations and leg swelling    PE  BP 119/84   Pulse 80   Resp 16   Ht 5\' 7"  (1.702 m)   Wt 239 lb 0.6 oz (108.4 kg)   BMI 37.44 kg/m   Patient alert and oriented and in no cardiopulmonary distress.  HEENT: No facial asymmetry, EOMI,     Neck supple .  Chest: Clear to auscultation bilaterally.  CVS: S1, S2 no murmurs, no S3.Regular rate.  ABD: Soft non tender.   Ext: No edema  Skin: Intact,erythematous maculopapular rash on lower extremities and back, no drainage.  Psych: Good eye contact, normal affect. Memory intact not anxious or depressed appearing.  CNS: CN 2-12 intact, power,  normal throughout.no focal deficits noted.   Assessment & Plan  Allergic reaction caused by a drug Acute onset following zofran. De[po medrol 80 mg im and prednisone dose pack also hydroxyzine  Essential hypertension Controlled, no change in medication

## 2019-10-11 ENCOUNTER — Encounter: Payer: Self-pay | Admitting: Family Medicine

## 2019-10-11 NOTE — Assessment & Plan Note (Signed)
Controlled, no change in medication  

## 2019-10-22 ENCOUNTER — Ambulatory Visit: Payer: Medicare Other | Admitting: Family Medicine

## 2019-10-31 ENCOUNTER — Ambulatory Visit: Payer: Medicare Other

## 2019-11-07 ENCOUNTER — Ambulatory Visit: Payer: Medicare Other | Attending: Internal Medicine

## 2019-11-07 DIAGNOSIS — Z23 Encounter for immunization: Secondary | ICD-10-CM

## 2019-11-07 NOTE — Progress Notes (Signed)
   Covid-19 Vaccination Clinic  Name:  LUIS SAMI    MRN: [id number] DOB: 18-Oct-1960  11/07/2019  Ms. Mccormick was observed post Covid-19 immunization for 15 minutes without incident. She was provided with Vaccine Information Sheet and instruction to access the V-Safe system.   Ms. Steenbergen was instructed to call 911 with any severe reactions post vaccine: Marland Kitchen Difficulty breathing  . Swelling of face and throat  . A fast heartbeat  . A bad rash all over body  . Dizziness and weakness

## 2019-12-02 ENCOUNTER — Ambulatory Visit (INDEPENDENT_AMBULATORY_CARE_PROVIDER_SITE_OTHER): Payer: Medicare Other | Admitting: Gastroenterology

## 2019-12-09 ENCOUNTER — Ambulatory Visit (INDEPENDENT_AMBULATORY_CARE_PROVIDER_SITE_OTHER): Payer: Medicare Other | Admitting: Gastroenterology

## 2019-12-09 ENCOUNTER — Other Ambulatory Visit (HOSPITAL_COMMUNITY): Payer: Self-pay | Admitting: Psychiatry

## 2019-12-12 ENCOUNTER — Other Ambulatory Visit: Payer: Self-pay

## 2019-12-12 ENCOUNTER — Encounter (HOSPITAL_COMMUNITY): Payer: Self-pay | Admitting: Psychiatry

## 2019-12-12 ENCOUNTER — Telehealth (INDEPENDENT_AMBULATORY_CARE_PROVIDER_SITE_OTHER): Payer: Medicare Other | Admitting: Psychiatry

## 2019-12-12 DIAGNOSIS — F331 Major depressive disorder, recurrent, moderate: Secondary | ICD-10-CM

## 2019-12-12 DIAGNOSIS — F411 Generalized anxiety disorder: Secondary | ICD-10-CM | POA: Diagnosis not present

## 2019-12-12 MED ORDER — ZOLPIDEM TARTRATE 10 MG PO TABS: ORAL_TABLET | ORAL | 3 refills | Status: DC

## 2019-12-12 MED ORDER — ALPRAZOLAM 1 MG PO TABS: 1.0000 mg | ORAL_TABLET | Freq: Three times a day (TID) | ORAL | 3 refills | Status: DC

## 2019-12-12 NOTE — Progress Notes (Signed)
Virtual Visit via Telephone Note  I connected with Jenna Woods on 16/01/09 at  1:00 PM EDT by telephone and verified that I am speaking with the correct person using two identifiers.   I discussed the limitations, risks, security and privacy concerns of performing an evaluation and management service by telephone and the availability of in person appointments. I also discussed with the patient that there may be a patient responsible charge related to this service. The patient expressed understanding and agreed to proceed.     I discussed the assessment and treatment plan with the patient. The patient was provided an opportunity to ask questions and all were answered. The patient agreed with the plan and demonstrated an understanding of the instructions.   The patient was advised to call back or seek an in-person evaluation if the symptoms worsen or if the condition fails to improve as anticipated.  I provided 15 minutes of non-face-to-face time during this encounter. Location: Provider office, patient home  Levonne Spiller, MD  [facility] MD/PA/NP OP Progress Note  32/05/5571 2:20 PM Jenna Woods  MRN:  [id number]  Chief Complaint:  Chief Complaint    Anxiety; Follow-up     HPI: This patient is a 59 year old divorced black female who lives alone in [place].  She is on disability.  The patient returns after 3 months regarding her anxiety.  The patient states that she is doing well overall.  One of her sons who had not talked to her for 18 months came to see her on her birthday and she was very gratified.  She likes the place where she lives as it is a Retail banker apartment and she is made a lot of friends.  She has gotten all of her coronavirus and other vaccines and feels safe going out in the community.  She is sleeping better and her anxiety is under good control.  She denies significant depression or suicidal ideation Visit Diagnosis:    ICD-10-CM   1. Anxiety state  F41.1    2. Major depressive disorder, recurrent episode, moderate (HCC)  F33.1     Past Psychiatric History: none  Past Medical History:  Past Medical History:  Diagnosis Date  . Allergic reaction   . Allergy    perrenial   . Anxiety   . Arthritis   . Depression    h/o suicidal ideation in 2011  . Depression with anxiety   . Diverticulosis of colon 12/19/2015  . Diverticulosis of colon with hemorrhage 12/19/2015  . GERD (gastroesophageal reflux disease)   . Hypertension 1996  . Obesity   . Peripheral vascular disease ([place])   . Prediabetes 2013  . Raynauds syndrome   . Scleroderma ([place]) 1998  . Systemic lupus erythematosus ([place]) 1998   Treated at [facility]  . Tobacco abuse, in remission    10-pack-years discontinued in 2007  . Urinary frequency     Past Surgical History:  Procedure Laterality Date  . ABDOMINAL HYSTERECTOMY  1990   Neoplasm  . CESAREAN SECTION     x2  . COLONOSCOPY  2005  . COLONOSCOPY WITH PROPOFOL N/A 08/28/2015   Dr. Laural Golden: scattered medium-mouth diverticula entire colon, external hemorrhoids  . COLONOSCOPY WITH PROPOFOL N/A 07/27/2016   Procedure: COLONOSCOPY WITH PROPOFOL;  Surgeon: Rogene Houston, MD;  [facility];  Service: Endoscopy;  Laterality: N/A;  . FINGER AMPUTATION     Third finger, bilateral, distal  . INCISIONAL HERNIA REPAIR N/A 01/09/2019   Procedure: HERNIA  REPAIR INCISIONAL;  Surgeon: Virl Cagey, MD;  Location: [facility] OR[facility];  Service: General;  Laterality: N/A;  . INSERTION OF MESH N/A 01/09/2019   Procedure: INSERTION OF MESH;  Surgeon: Virl Cagey, MD;  Location: [facility];  Service: General;  Laterality: N/A;  . NASAL SINUS SURGERY  09/06/2011   Procedure: ENDOSCOPIC SINUS SURGERY;  Surgeon: Ascencion Dike, MD;  Location: [facility];  Service: ENT;  Laterality: N/A;  Nasal cerebrospinal fluid leak repair with Left temporalis fascia graft and Left Ear cartilage graft  . VASCULAR SURGERY     Hands, bilaterally,  [facility]; right hand partial amputation of middle finger.    Family Psychiatric History: see below  Family History:  Family History  Problem Relation Age of Onset  . Arthritis Mother        Rheumatoid  . Dementia Mother   . Hypertension Mother   . Cirrhosis Father   . Alcohol abuse Father   . Arthritis Maternal Grandmother   . Drug abuse Sister   . Scleroderma Brother   . Graves' disease Son   . Colon cancer Neg Hx     Social History:  Social History   Socioeconomic History  . Marital status: Single    Spouse name: Not on file  . Number of children: 2  . Years of education: 43  . Highest education level: 12th grade  Occupational History  . Occupation: Art therapist: [employer]    Comment: Disability awarded in [place] Use  . Smoking status: Former Smoker    Packs/day: 0.25    Years: 25.00    Pack years: 6.25    Types: Cigarettes    Quit date: 04/07/2001    Years since quitting: 18.6  . Smokeless tobacco: Never Used  . Tobacco comment: quit 88 days ago!!  Vaping Use  . Vaping Use: Never used  Substance and Sexual Activity  . Alcohol use: No    Alcohol/week: 0.0 standard drinks    Comment: quit in 2004  . Drug use: No    Comment: use to use pot   . Sexual activity: Not Currently    Birth control/protection: Surgical    Comment: hyst  Other Topics Concern  . Not on file  Social History Narrative   Currently living with son in [place], moving back soon    Social Determinants of Health   Financial Resource Strain: Low Risk   . Difficulty of Paying Living Expenses: Not hard at all  Food Insecurity: No Food Insecurity  . Worried About Charity fundraiser in the Last Year: Never true  . Ran Out of Food in the Last Year: Never true  Transportation Needs: No Transportation Needs  . Lack of Transportation (Medical): No  . Lack of Transportation (Non-Medical): No  Physical Activity: Insufficiently Active  . Days of  Exercise per Week: 4 days  . Minutes of Exercise per Session: 30 min  Stress: Stress Concern Present  . Feeling of Stress : To some extent  Social Connections: Socially Isolated  . Frequency of Communication with Friends and Family: More than three times a week  . Frequency of Social Gatherings with Friends and Family: Once a week  . Attends Religious Services: Never  . Active Member of Clubs or Organizations: No  . Attends Archivist Meetings: Never  . Marital Status: Divorced    Allergies:  Allergies  Allergen Reactions  . Bee Venom Anaphylaxis  .  Sesame Oil Anaphylaxis and Swelling    (Sesame seed)  . Aspirin     Diverticulosis with bleed  . Molds & Smuts   . Zofran [Ondansetron] Itching    Generalized itching and had a rash on her thighs  . Codeine Itching and Rash    Metabolic Disorder Labs: Lab Results  Component Value Date   HGBA1C 5.9 (H) 04/01/2019   MPG 123 04/01/2019   MPG 126 09/03/2018   No results found for: PROLACTIN Lab Results  Component Value Date   CHOL 145 04/01/2019   TRIG 91 04/01/2019   HDL 50 04/01/2019   CHOLHDL 2.9 04/01/2019   VLDL 15 06/30/2016   LDLCALC 78 04/01/2019   LDLCALC 68 09/03/2018   Lab Results  Component Value Date   TSH 1.71 04/01/2019   TSH 0.64 01/29/2018    Therapeutic Level Labs: No results found for: LITHIUM No results found for: VALPROATE No components found for:  CBMZ  Current Medications: Current Outpatient Medications  Medication Sig Dispense Refill  . ALPRAZolam (XANAX) 1 MG tablet Take 1 tablet (1 mg total) by mouth 3 (three) times daily. 90 tablet 3  . amLODipine (NORVASC) 10 MG tablet TAKE 1 TABLET(10 MG) BY MOUTH DAILY 90 tablet 1  . Ascorbic Acid (VITAMIN C) 500 MG CAPS Take 500 mg by mouth daily.    . Cholecalciferol (VITAMIN D) 2000 units CAPS Omne daily 30 capsule   . ferrous sulfate (CVS IRON) 325 (65 FE) MG tablet Take 325 mg by mouth 1 day or 1 dose. 60 tablet 3  . hydrOXYzine  (ATARAX/VISTARIL) 10 MG tablet Take 1 tablet (10 mg total) by mouth 3 (three) times daily as needed. 30 tablet 0  . MYRBETRIQ 25 MG TB24 tablet TAKE 1 TABLET(25 MG) BY MOUTH DAILY 30 tablet 5  . pantoprazole (PROTONIX) 40 MG tablet TAKE 1 TABLET(40 MG) BY MOUTH DAILY 90 tablet 1  . polyethylene glycol powder (GLYCOLAX/MIRALAX) 17 GM/SCOOP powder Take 17 g by mouth daily. 3350 g 3  . predniSONE (STERAPRED UNI-PAK 21 TAB) 5 MG (21) TBPK tablet Take 1 tablet (5 mg total) by mouth as directed. Use as directed 21 tablet 0  . STOOL SOFTENER 100 MG capsule Take 100 mg by mouth 2 (two) times daily.    Marland Kitchen zolpidem (AMBIEN) 10 MG tablet TAKE 1 TABLET(10 MG) BY MOUTH AT BEDTIME AS NEEDED FOR SLEEP 30 tablet 3   No current facility-administered medications for this visit.     Musculoskeletal: Strength & Muscle Tone: within normal limits Gait & Station: normal Patient leans: N/A  Psychiatric Specialty Exam: Review of Systems  All other systems reviewed and are negative.   There were no vitals taken for this visit.There is no height or weight on file to calculate BMI.  General Appearance: NA  Eye Contact:  NA  Speech:  Clear and Coherent  Volume:  Normal  Mood:  Euthymic  Affect:  NA  Thought Process:  Goal Directed  Orientation:  Full (Time, Place, and Person)  Thought Content: WDL   Suicidal Thoughts:  No  Homicidal Thoughts:  No  Memory:  Immediate;   Good Recent;   Good Remote;   Good  Judgement:  Good  Insight:  Fair  Psychomotor Activity:  Normal  Concentration:  Concentration: Good and Attention Span: Good  Recall:  Good  Fund of Knowledge: Good  Language: Good  Akathisia:  No  Handed:  Right  AIMS (if indicated): not done  Assets:  Communication  Skills Desire for Improvement Physical Health Resilience Social Support Talents/Skills  ADL's:  Intact  Cognition: WNL  Sleep:  Good   Screenings: GAD-7     Office Visit from 06/04/2019 in [facility]  Phone Follow Up from 05/25/2017 in [facility]  Total GAD-7 Score 15 13    PHQ2-9     Office Visit from 10/08/2019 in [facility] Office Visit from 10/02/2019 in [facility] Office Visit from 06/04/2019 in [facility] from 05/07/2019 in [facility] Visit from 04/16/2019 in [facility]  PHQ-2 Total Score 1 3 5  0 0  PHQ-9 Total Score -- 5 13 -- --       Assessment and Plan: This patient is a 59 year old female with a history of anxiety and insomnia.  She continues to do well on her current regimen.  She will continue Xanax 1 mg up to 3 times daily for anxiety as well as Ambien 10 mg at bedtime for sleep.  She will return to see me in 4 months   Levonne Spiller, MD 12/12/2019, 1:19 PM

## 2019-12-19 ENCOUNTER — Other Ambulatory Visit: Payer: Self-pay

## 2019-12-19 ENCOUNTER — Encounter: Payer: Self-pay | Admitting: Family Medicine

## 2019-12-19 ENCOUNTER — Ambulatory Visit (INDEPENDENT_AMBULATORY_CARE_PROVIDER_SITE_OTHER): Payer: Medicare Other | Admitting: Family Medicine

## 2019-12-19 VITALS — BP 126/78 | HR 92 | Ht 67.0 in | Wt 240.0 lb

## 2019-12-19 DIAGNOSIS — M25512 Pain in left shoulder: Secondary | ICD-10-CM | POA: Diagnosis not present

## 2019-12-19 DIAGNOSIS — M25511 Pain in right shoulder: Secondary | ICD-10-CM | POA: Diagnosis not present

## 2019-12-19 DIAGNOSIS — W19XXXA Unspecified fall, initial encounter: Secondary | ICD-10-CM

## 2019-12-19 MED ORDER — METHYLPREDNISOLONE ACETATE 80 MG/ML IJ SUSP
80.0000 mg | Freq: Once | INTRAMUSCULAR | Status: AC
  Administered 2019-12-19: 80 mg via INTRAMUSCULAR

## 2019-12-19 MED ORDER — KETOROLAC TROMETHAMINE 60 MG/2ML IM SOLN
60.0000 mg | Freq: Once | INTRAMUSCULAR | Status: AC
  Administered 2019-12-19: 60 mg via INTRAMUSCULAR

## 2019-12-19 MED ORDER — PREDNISONE 5 MG PO TABS: ORAL_TABLET | ORAL | 0 refills | Status: DC

## 2019-12-19 NOTE — Progress Notes (Signed)
Subjective:  Patient ID: Jenna Woods, female    DOB: May 11, 1960  Age: 59 y.o. MRN: [id number]  CC:  Chief Complaint  Patient presents with  . Fall    fell x2 days ago, scratched nose, feels sore mainly in her shoulders      HPI  HPI   Jenna Woods is a 59 year old female patient of Dr Camillia Herter. She presents today for an acute visit after sustaining a fall on Wednesday (10/13 )early morning at 3:09 am. She remembers the time as she saw the clock when she got out of bed to use the restroom. She tripped of a bag in the floor and fell face forward hitting the hall rug- she hit the bridge of her nose and under Right eye, both areas have abrasions. She denies loss of consciousness, breaks or pops that he heard. She is very sore in her upper back/traps and shoulders. She did not brace herself so no other arm or wrist involvement occurred.  Pain is a 8/10 today. She has tried tylenol with min relief of pain. Does not feel she needs an ED or UC visit.    Today patient denies signs and symptoms of COVID 19 infection including fever, chills, cough, shortness of breath, and headache. Past Medical, Surgical, Social History, Allergies, and Medications have been Reviewed.   Past Medical History:  Diagnosis Date  . Allergic reaction   . Allergy    perrenial   . Anxiety   . Arthritis   . Depression    h/o suicidal ideation in 2011  . Depression with anxiety   . Diverticulosis of colon 12/19/2015  . Diverticulosis of colon with hemorrhage 12/19/2015  . GERD (gastroesophageal reflux disease)   . Hypertension 1996  . Obesity   . Peripheral vascular disease ([place])   . Prediabetes 2013  . Raynauds syndrome   . Scleroderma ([place]) 1998  . Systemic lupus erythematosus ([place]) 1998   Treated at [facility]  . Tobacco abuse, in remission    10-pack-years discontinued in 2007  . Urinary frequency     Current Meds  Medication Sig  . ALPRAZolam (XANAX) 1 MG tablet Take 1 tablet (1 mg total)  by mouth 3 (three) times daily.  Marland Kitchen amLODipine (NORVASC) 10 MG tablet TAKE 1 TABLET(10 MG) BY MOUTH DAILY  . Ascorbic Acid (VITAMIN C) 500 MG CAPS Take 500 mg by mouth daily.  . Cholecalciferol (VITAMIN D) 2000 units CAPS Omne daily  . ferrous sulfate (CVS IRON) 325 (65 FE) MG tablet Take 325 mg by mouth 1 day or 1 dose.  Marland Kitchen MYRBETRIQ 25 MG TB24 tablet TAKE 1 TABLET(25 MG) BY MOUTH DAILY  . pantoprazole (PROTONIX) 40 MG tablet TAKE 1 TABLET(40 MG) BY MOUTH DAILY  . polyethylene glycol powder (GLYCOLAX/MIRALAX) 17 GM/SCOOP powder Take 17 g by mouth daily.  Marland Kitchen zolpidem (AMBIEN) 10 MG tablet TAKE 1 TABLET(10 MG) BY MOUTH AT BEDTIME AS NEEDED FOR SLEEP  . [DISCONTINUED] STOOL SOFTENER 100 MG capsule Take 100 mg by mouth 2 (two) times daily.   Current Facility-Administered Medications for the 12/19/19 encounter (Office Visit) with Perlie Mayo, NP  Medication  . ketorolac (TORADOL) injection 60 mg  . methylPREDNISolone acetate (DEPO-MEDROL) injection 80 mg    ROS:  Review of Systems  Constitutional: Negative.   HENT: Negative.   Eyes: Negative.   Respiratory: Negative.   Cardiovascular: Negative.   Gastrointestinal: Negative.   Genitourinary: Negative.   Musculoskeletal: Positive for falls.  Skin: Negative.        Abrasions   Neurological: Negative.   Endo/Heme/Allergies: Negative.   Psychiatric/Behavioral: Negative.      Objective:   Today's Vitals: BP 126/78 (BP Location: Left Arm, Patient Position: Sitting, Cuff Size: Large)   Pulse 92   Ht 5\' 7"  (1.702 m)   Wt 240 lb (108.9 kg)   SpO2 98%   BMI 37.59 kg/m  Vitals with BMI 12/19/2019 10/08/2019 10/02/2019  Height 5\' 7"  5\' 7"  5\' 7"   Weight 240 lbs 239 lbs 1 oz 244 lbs  BMI 37.58 54.27 06.23  Systolic 762 831 517  Diastolic 78 84 88  Pulse 92 80 94  Some encounter information is confidential and restricted. Go to Review Flowsheets activity to see all data.     Physical Exam Vitals and nursing note reviewed.    Constitutional:      Appearance: Normal appearance. She is well-developed and well-groomed. She is obese.  HENT:     Head: Normocephalic and atraumatic.     Right Ear: External ear normal.     Left Ear: External ear normal.     Nose: Signs of injury present.     Comments: Abrasion to the bridge of nose, swelling across the bridge.  Abrasion under Right eye    Mouth/Throat:     Mouth: Mucous membranes are moist.     Pharynx: Oropharynx is clear.  Eyes:     General:        Right eye: No discharge.        Left eye: No discharge.     Conjunctiva/sclera: Conjunctivae normal.  Cardiovascular:     Rate and Rhythm: Normal rate and regular rhythm.     Pulses: Normal pulses.     Heart sounds: Normal heart sounds.  Pulmonary:     Effort: Pulmonary effort is normal.     Breath sounds: Normal breath sounds.  Musculoskeletal:        General: Normal range of motion.     Cervical back: Normal range of motion and neck supple.     Comments: MAE, ROM intact   Skin:    General: Skin is warm.     Findings: Abrasion present.  Neurological:     General: No focal deficit present.     Mental Status: She is alert and oriented to person, place, and time.     Cranial Nerves: Cranial nerves are intact.     Sensory: Sensation is intact.     Motor: Motor function is intact.     Coordination: Coordination is intact.     Gait: Gait is intact.  Psychiatric:        Attention and Perception: Attention normal.        Mood and Affect: Mood normal.        Speech: Speech normal.        Behavior: Behavior normal. Behavior is cooperative.        Thought Content: Thought content normal.        Cognition and Memory: Cognition normal.        Judgment: Judgment normal.     Assessment   1. Fall, initial encounter   2. Acute pain of both shoulders     Tests ordered No orders of the defined types were placed in this encounter.    Plan: Please see assessment and plan per problem list above.   Meds  ordered this encounter  Medications  . ketorolac (TORADOL) injection 60 mg  .  methylPREDNISolone acetate (DEPO-MEDROL) injection 80 mg  . predniSONE (DELTASONE) 5 MG tablet    Sig: Take as directed    Dispense:  21 tablet    Refill:  0    Please provide dose pack directions. Thank you    Order Specific Question:   Supervising Provider    Answer:   Fayrene Helper [[id number]]    Patient to follow-up in as scheduled  Note: This dictation was prepared with Dragon dictation along with smaller phrase technology. Similar sounding words can be transcribed inadequately or may not be corrected upon review. Any transcriptional errors that result from this process are unintentional.      Perlie Mayo, NP

## 2019-12-19 NOTE — Assessment & Plan Note (Signed)
Pain post fall. Injections today and pred pk.  Ice encouraged. Provided with return cautions and ED cautions.  Verbal understanding. Reviewed side effects, risks and benefits of medication.

## 2019-12-19 NOTE — Patient Instructions (Signed)
  HAPPY FALL!  I appreciate the opportunity to provide you with care for your health and wellness. Today we discussed: recent fall and injuries   Follow up: 02/12/2020 as scheduled   No labs or referrals today  Injections today to help with pain.  Prednisone dose pack sent in to take next 6 days to help with inflammation and pain.  Use ice pack across bridge of nose.  Please continue to practice social distancing to keep you, your family, and our community safe.  If you must go out, please wear a mask and practice good handwashing.  It was a pleasure to see you and I look forward to continuing to work together on your health and well-being. Please do not hesitate to call the office if you need care or have questions about your care.  Have a wonderful day and week. With Gratitude, Cherly Beach, DNP, AGNP-BC

## 2019-12-19 NOTE — Assessment & Plan Note (Signed)
Fall on early Wednesday Am 10/13. No LOC. Denies UC or ED visits. Declines xrays. Soreness in shoulders and across bridge of nose.  Injections for pain today to help with soreness. Pred pak for inflammation and swelling. Ice encouraged. Provided with return cautions and ED cautions.  Verbal understanding. Reviewed side effects, risks and benefits of medication.

## 2019-12-26 ENCOUNTER — Other Ambulatory Visit: Payer: Self-pay | Admitting: Family Medicine

## 2020-01-02 ENCOUNTER — Ambulatory Visit (INDEPENDENT_AMBULATORY_CARE_PROVIDER_SITE_OTHER): Payer: Medicare Other | Admitting: Obstetrics & Gynecology

## 2020-01-02 ENCOUNTER — Encounter: Payer: Self-pay | Admitting: Obstetrics & Gynecology

## 2020-01-02 VITALS — BP 115/81 | HR 95 | Ht 67.0 in | Wt 242.5 lb

## 2020-01-02 DIAGNOSIS — Z4689 Encounter for fitting and adjustment of other specified devices: Secondary | ICD-10-CM

## 2020-01-02 DIAGNOSIS — N993 Prolapse of vaginal vault after hysterectomy: Secondary | ICD-10-CM | POA: Diagnosis not present

## 2020-01-02 NOTE — Progress Notes (Signed)
Chief Complaint  Patient presents with  . Pessary Check    Blood pressure 115/81, pulse 95, height 5\' 7"  (1.702 m), weight 242 lb 8 oz (110 kg).  Jenna Woods presents today for routine follow up related to her pessary.   She uses a Milex ring with support #2 She reports no vaginal discharge or vaginal bleeding.  Exam reveals no undue vaginal mucosal pressure of breakdown, no discharge and no vaginal bleeding.  The pessary is removed, cleaned and replaced without difficulty.      ICD-10-CM   1. Pessary maintenance, Milex ring with support #2, fitting 3/21  Z46.89   2. Pelvic relaxation due to vaginal vault prolapse, posthysterectomy, Milex ring with support #2  U54.8       Jenna Woods will be sen back in 4 months for continued follow up.  Florian Buff, MD  01/02/2020 12:11 PM

## 2020-01-09 ENCOUNTER — Other Ambulatory Visit: Payer: Self-pay | Admitting: Family Medicine

## 2020-02-12 ENCOUNTER — Ambulatory Visit: Payer: Medicare Other | Admitting: Family Medicine

## 2020-02-20 ENCOUNTER — Ambulatory Visit (INDEPENDENT_AMBULATORY_CARE_PROVIDER_SITE_OTHER): Payer: Medicare Other | Admitting: Family Medicine

## 2020-02-20 ENCOUNTER — Other Ambulatory Visit: Payer: Self-pay

## 2020-02-20 ENCOUNTER — Encounter: Payer: Self-pay | Admitting: Family Medicine

## 2020-02-20 VITALS — BP 118/83 | HR 78 | Resp 16 | Ht 67.0 in | Wt 239.0 lb

## 2020-02-20 DIAGNOSIS — E559 Vitamin D deficiency, unspecified: Secondary | ICD-10-CM

## 2020-02-20 DIAGNOSIS — I10 Essential (primary) hypertension: Secondary | ICD-10-CM

## 2020-02-20 DIAGNOSIS — Z13220 Encounter for screening for lipoid disorders: Secondary | ICD-10-CM

## 2020-02-20 DIAGNOSIS — F324 Major depressive disorder, single episode, in partial remission: Secondary | ICD-10-CM

## 2020-02-20 DIAGNOSIS — J302 Other seasonal allergic rhinitis: Secondary | ICD-10-CM

## 2020-02-20 DIAGNOSIS — R7302 Impaired glucose tolerance (oral): Secondary | ICD-10-CM | POA: Diagnosis not present

## 2020-02-20 DIAGNOSIS — F5105 Insomnia due to other mental disorder: Secondary | ICD-10-CM

## 2020-02-20 DIAGNOSIS — K219 Gastro-esophageal reflux disease without esophagitis: Secondary | ICD-10-CM

## 2020-02-20 MED ORDER — PROMETHAZINE-DM 6.25-15 MG/5ML PO SYRP: ORAL_SOLUTION | ORAL | 0 refills | Status: DC

## 2020-02-20 MED ORDER — ACETAMINOPHEN ER 650 MG PO TBCR: 650.0000 mg | EXTENDED_RELEASE_TABLET | Freq: Three times a day (TID) | ORAL | 0 refills | Status: DC | PRN

## 2020-02-20 MED ORDER — MONTELUKAST SODIUM 10 MG PO TABS: 10.0000 mg | ORAL_TABLET | Freq: Every day | ORAL | 3 refills | Status: DC

## 2020-02-20 NOTE — Patient Instructions (Addendum)
F/u in 5 months, call if you need me before  Please get fasting CBC, lipid, chem 7 and EGFr, hBA1C, TSH and vit D first week in February   Tylenol 2 tablets in office today for headache  please schedule February mammogram at [facility]  Blood pressure is excellent , continue medication  New for allergies is once daily montelukast   Best for the New year

## 2020-02-23 ENCOUNTER — Encounter: Payer: Self-pay | Admitting: Family Medicine

## 2020-02-23 DIAGNOSIS — J302 Other seasonal allergic rhinitis: Secondary | ICD-10-CM | POA: Insufficient documentation

## 2020-02-23 NOTE — Assessment & Plan Note (Signed)
Sleep hygiene reviewed and written information offered also. Prescription sent for  medication needed.prescribed by Psych

## 2020-02-23 NOTE — Assessment & Plan Note (Signed)
Controlled, no change in medication  

## 2020-02-23 NOTE — Assessment & Plan Note (Signed)
Controlled, no change in medication DASH diet and commitment to daily physical activity for a minimum of 30 minutes discussed and encouraged, as a part of hypertension management. The importance of attaining a healthy weight is also discussed.  BP/Weight 02/20/2020 01/02/2020 12/19/2019 10/08/2019 10/02/2019 09/03/2019 10/22/5629  Systolic BP 497 026 378 588 502 774 128  Diastolic BP 83 81 78 84 88 92 93  Wt. (Lbs) 239 242.5 240 239.04 244 242 240  BMI 37.43 37.98 37.59 37.44 38.22 37.9 37.59  Some encounter information is confidential and restricted. Go to Review Flowsheets activity to see all data.

## 2020-02-23 NOTE — Progress Notes (Signed)
Jenna Woods     MRN: [id number]      DOB: 03-Jun-1960   HPI Jenna Woods is here for follow up and re-evaluation of chronic medical conditions, medication management and review of any available recent lab and radiology data.  Preventive health is updated, specifically  Cancer screening and Immunization.   Questions or concerns regarding consultations or procedures which the PT has had in the interim are  addressed. The PT denies any adverse reactions to current medications since the last visit.  There are no new concerns.  C/o sinus pressure and nasal congestion x 2 days with frontal headache, no fever or chills, drainage is clear  ROS Denies recent fever or chills. Denies  ear pain or sore throat. Denies chest congestion, productive cough or wheezing. Denies chest pains, palpitations and leg swelling Denies abdominal pain, nausea, vomiting,diarrhea or constipation.   Denies dysuria, frequency, hesitancy or incontinence. Denies joint pain, swelling and limitation in mobility. Denies  seizures, numbness, or tingling. Denies depression, anxiety or insomnia. Denies skin break down or rash.   PE  BP 118/83   Pulse 78   Resp 16   Ht 5\' 7"  (1.702 m)   Wt 239 lb (108.4 kg)   SpO2 94%   BMI 37.43 kg/m   Patient alert and oriented and in no cardiopulmonary distress.  HEENT: No facial asymmetry, EOMI,     Neck supple .  Chest: Clear to auscultation bilaterally.  CVS: S1, S2 no murmurs, no S3.Regular rate.  ABD: Soft non tender.   Ext: No edema  MS: Adequate ROM spine, shoulders, hips and knees.  Skin: Intact, no ulcerations or rash noted.  Psych: Good eye contact, normal affect. Memory intact not anxious or depressed appearing.  CNS: CN 2-12 intact, power,  normal throughout.no focal deficits noted.   Assessment & Plan  Essential hypertension Controlled, no change in medication DASH diet and commitment to daily physical activity for a minimum of 30 minutes  discussed and encouraged, as a part of hypertension management. The importance of attaining a healthy weight is also discussed.  BP/Weight 02/20/2020 01/02/2020 12/19/2019 10/08/2019 10/02/2019 09/03/2019 6/44/0347  Systolic BP 425 956 387 564 332 951 884  Diastolic BP 83 81 78 84 88 92 93  Wt. (Lbs) 239 242.5 240 239.04 244 242 240  BMI 37.43 37.98 37.59 37.44 38.22 37.9 37.59  Some encounter information is confidential and restricted. Go to Review Flowsheets activity to see all data.       Morbid obesity ([place]) Obesity linked with hypertension and depresion  Patient re-educated about  the importance of commitment to a  minimum of 150 minutes of exercise per week as able.  The importance of healthy food choices with portion control discussed, as well as eating regularly and within a 12 hour window most days. The need to choose "clean , green" food 50 to 75% of the time is discussed, as well as to make water the primary drink and set a goal of 64 ounces water daily.    Weight /BMI 02/20/2020 01/02/2020 12/19/2019  WEIGHT 239 lb 242 lb 8 oz 240 lb  HEIGHT 5\' 7"  5\' 7"  5\' 7"   BMI 37.43 kg/m2 37.98 kg/m2 37.59 kg/m2  Some encounter information is confidential and restricted. Go to Review Flowsheets activity to see all data.      GERD (gastroesophageal reflux disease) Controlled, no change in medication   Major depression Managed by psych,  Holidays are stressful, control is fair  Insomnia  due to mental disorder Sleep hygiene reviewed and written information offered also. Prescription sent for  medication needed.prescribed by Psych   Seasonal allergies Current flare start montelukast, tylenol given for headache

## 2020-02-23 NOTE — Assessment & Plan Note (Signed)
Obesity linked with hypertension and depresion  Patient re-educated about  the importance of commitment to a  minimum of 150 minutes of exercise per week as able.  The importance of healthy food choices with portion control discussed, as well as eating regularly and within a 12 hour window most days. The need to choose "clean , green" food 50 to 75% of the time is discussed, as well as to make water the primary drink and set a goal of 64 ounces water daily.    Weight /BMI 02/20/2020 01/02/2020 12/19/2019  WEIGHT 239 lb 242 lb 8 oz 240 lb  HEIGHT 5\' 7"  5\' 7"  5\' 7"   BMI 37.43 kg/m2 37.98 kg/m2 37.59 kg/m2  Some encounter information is confidential and restricted. Go to Review Flowsheets activity to see all data.

## 2020-02-23 NOTE — Assessment & Plan Note (Signed)
Managed by psych,  Holidays are stressful, control is fair

## 2020-02-23 NOTE — Assessment & Plan Note (Signed)
Current flare start montelukast, tylenol given for headache

## 2020-03-03 ENCOUNTER — Other Ambulatory Visit: Payer: Medicare Other

## 2020-03-03 ENCOUNTER — Other Ambulatory Visit: Payer: Self-pay

## 2020-03-03 DIAGNOSIS — Z20822 Contact with and (suspected) exposure to covid-19: Secondary | ICD-10-CM

## 2020-03-04 LAB — NOVEL CORONAVIRUS, NAA: SARS-CoV-2, NAA: NOT DETECTED

## 2020-03-04 LAB — SPECIMEN STATUS REPORT

## 2020-03-04 LAB — SARS-COV-2, NAA 2 DAY TAT

## 2020-03-09 ENCOUNTER — Telehealth: Payer: Self-pay

## 2020-03-09 NOTE — Telephone Encounter (Signed)
Message left that test was negative

## 2020-03-09 NOTE — Telephone Encounter (Signed)
Patient called need results of her covid testing done last Thursday at [facility]. Results show 0 results. Can you give this patient a call [phone].

## 2020-03-09 NOTE — Telephone Encounter (Signed)
Patient needs a print of of the negative results.

## 2020-03-16 ENCOUNTER — Other Ambulatory Visit: Payer: Self-pay | Admitting: Family Medicine

## 2020-03-16 DIAGNOSIS — Z1231 Encounter for screening mammogram for malignant neoplasm of breast: Secondary | ICD-10-CM

## 2020-04-03 ENCOUNTER — Other Ambulatory Visit (HOSPITAL_COMMUNITY): Payer: Self-pay | Admitting: Psychiatry

## 2020-04-03 ENCOUNTER — Other Ambulatory Visit: Payer: Self-pay | Admitting: Family Medicine

## 2020-04-04 ENCOUNTER — Other Ambulatory Visit: Payer: Self-pay | Admitting: Family Medicine

## 2020-04-13 ENCOUNTER — Telehealth (INDEPENDENT_AMBULATORY_CARE_PROVIDER_SITE_OTHER): Payer: Medicare Other | Admitting: Psychiatry

## 2020-04-13 ENCOUNTER — Other Ambulatory Visit: Payer: Self-pay

## 2020-04-13 ENCOUNTER — Encounter (HOSPITAL_COMMUNITY): Payer: Self-pay | Admitting: Psychiatry

## 2020-04-13 DIAGNOSIS — F411 Generalized anxiety disorder: Secondary | ICD-10-CM | POA: Diagnosis not present

## 2020-04-13 MED ORDER — ZOLPIDEM TARTRATE 10 MG PO TABS: ORAL_TABLET | ORAL | 3 refills | Status: DC

## 2020-04-13 MED ORDER — ALPRAZOLAM 1 MG PO TABS: 1.0000 mg | ORAL_TABLET | Freq: Three times a day (TID) | ORAL | 3 refills | Status: DC

## 2020-04-13 NOTE — Progress Notes (Signed)
Virtual Visit via Telephone Note  I connected with SAMAIRA HOLZWORTH on 50/09/38 at 11:00 AM EST by telephone and verified that I am speaking with the correct person using two identifiers.  Location: Patient: home Provider: home   I discussed the limitations, risks, security and privacy concerns of performing an evaluation and management service by telephone and the availability of in person appointments. I also discussed with the patient that there may be a patient responsible charge related to this service. The patient expressed understanding and agreed to proceed.     I discussed the assessment and treatment plan with the patient. The patient was provided an opportunity to ask questions and all were answered. The patient agreed with the plan and demonstrated an understanding of the instructions.   The patient was advised to call back or seek an in-person evaluation if the symptoms worsen or if the condition fails to improve as anticipated.  I provided 15 minutes of non-face-to-face time during this encounter.   Levonne Spiller, MD  [facility] MD/PA/NP OP Progress Note  03/15/2991 71:69 AM COREN CROWNOVER  MRN:  [id number]  Chief Complaint:  Chief Complaint    Anxiety; Follow-up     HPI: This patient is a 60 year old divorced black female who lives alone in [place].  She is on disability.  The patient returns after 4 months regarding her anxiety and difficulty sleeping.  The patient states that she is doing well after all.  She is still living in a senior citizen apartment and she generally likes it.  However she has a new landlord and they are really not getting along and they have had several disagreements.  She is mostly staying in to avoid the coronavirus.  She is sleeping well and her anxiety is under good control.  She denies significant depression. Visit Diagnosis:    ICD-10-CM   1. Anxiety state  F41.1     Past Psychiatric History: none  Past Medical History:  Past Medical  History:  Diagnosis Date  . Allergic reaction   . Allergy    perrenial   . Anxiety   . Arthritis   . Depression    h/o suicidal ideation in 2011  . Depression with anxiety   . Diverticulosis of colon 12/19/2015  . Diverticulosis of colon with hemorrhage 12/19/2015  . GERD (gastroesophageal reflux disease)   . Hypertension 1996  . Obesity   . Peripheral vascular disease ([place])   . Prediabetes 2013  . Raynauds syndrome   . Scleroderma ([place]) 1998  . Systemic lupus erythematosus ([place]) 1998   T[facility] at [facility]  . Tobacco abuse, in remission    10-pack-years discontinued in 2007  . Urinary frequency     Past Surgical History:  Procedure Laterality Date  . ABDOMINAL HYSTERECTOMY  1990   Neoplasm  . CESAREAN SECTION     x2  . COLONOSCOPY  2005  . COLONOSCOPY WITH PROPOFOL N/A 08/28/2015   Dr. Laural Golden: scattered medium-mouth diverticula entire colon, external hemorrhoids  . COLONOSCOPY WITH PROPOFOL N/A 07/27/2016   Procedure: COLONOSCOPY WITH PROPOFOL;  Surgeon: Rogene Houston, MD;  [facility];  Service: Endoscopy;  Laterality: N/A;  . FINGER AMPUTATION     Third finger, bilateral, distal  . INCISIONAL HERNIA REPAIR N/A 01/09/2019   Procedure: HERNIA REPAIR INCISIONAL;  Surgeon: Virl Cagey, MD;  Location: [facility];  Service: General;  Laterality: N/A;  . INSERTION OF MESH N/A 01/09/2019   Procedure: INSERTION OF MESH;  Surgeon:  Virl Cagey, MD;  Location: [facility];  Service: General;  Laterality: N/A;  . NASAL SINUS SURGERY  09/06/2011   Procedure: ENDOSCOPIC SINUS SURGERY;  Surgeon: Ascencion Dike, MD;  Location: [place];  Service: ENT;  Laterality: N/A;  Nasal cerebrospinal fluid leak repair with Left temporalis fascia graft and Left Ear cartilage graft  . VASCULAR SURGERY     Hands, bilaterally, [facility]; right hand partial amputation of middle finger.    Family Psychiatric History: see below  Family History:  Family History   Problem Relation Age of Onset  . Arthritis Mother        Rheumatoid  . Dementia Mother   . Hypertension Mother   . Cirrhosis Father   . Alcohol abuse Father   . Arthritis Maternal Grandmother   . Drug abuse Sister   . Scleroderma Brother   . Graves' disease Son   . Colon cancer Neg Hx     Social History:  Social History   Socioeconomic History  . Marital status: Single    Spouse name: Not on file  . Number of children: 2  . Years of education: 12  . Highest education level: 12th grade  Occupational History  . Occupation: Art therapist: [employer]    Comment: Disability awarded in [place] Use  . Smoking status: Former Smoker    Packs/day: 0.25    Years: 25.00    Pack years: 6.25    Types: Cigarettes    Quit date: 04/07/2001    Years since quitting: 19.0  . Smokeless tobacco: Never Used  . Tobacco comment: quit 88 days ago!!  Vaping Use  . Vaping Use: Never used  Substance and Sexual Activity  . Alcohol use: No    Alcohol/week: 0.0 standard drinks    Comment: quit in 2004  . Drug use: No    Comment: use to use pot   . Sexual activity: Not Currently    Birth control/protection: Surgical    Comment: hyst  Other Topics Concern  . Not on file  Social History Narrative   Currently living with son in [place], moving back soon    Social Determinants of Health   Financial Resource Strain: Low Risk   . Difficulty of Paying Living Expenses: Not hard at all  Food Insecurity: No Food Insecurity  . Worried About Charity fundraiser in the Last Year: Never true  . Ran Out of Food in the Last Year: Never true  Transportation Needs: No Transportation Needs  . Lack of Transportation (Medical): No  . Lack of Transportation (Non-Medical): No  Physical Activity: Insufficiently Active  . Days of Exercise per Week: 4 days  . Minutes of Exercise per Session: 30 min  Stress: Stress Concern Present  . Feeling of Stress : To some extent  Social  Connections: Socially Isolated  . Frequency of Communication with Friends and Family: More than three times a week  . Frequency of Social Gatherings with Friends and Family: Once a week  . Attends Religious Services: Never  . Active Member of Clubs or Organizations: No  . Attends Archivist Meetings: Never  . Marital Status: Divorced    Allergies:  Allergies  Allergen Reactions  . Bee Venom Anaphylaxis  . Sesame Oil Anaphylaxis and Swelling    (Sesame seed)  . Aspirin     Diverticulosis with bleed  . Molds & Smuts   . Zofran [Ondansetron] Itching  Generalized itching and had a rash on her thighs  . Codeine Itching and Rash    Metabolic Disorder Labs: Lab Results  Component Value Date   HGBA1C 5.9 (H) 04/01/2019   MPG 123 04/01/2019   MPG 126 09/03/2018   No results found for: PROLACTIN Lab Results  Component Value Date   CHOL 145 04/01/2019   TRIG 91 04/01/2019   HDL 50 04/01/2019   CHOLHDL 2.9 04/01/2019   VLDL 15 06/30/2016   LDLCALC 78 04/01/2019   LDLCALC 68 09/03/2018   Lab Results  Component Value Date   TSH 1.71 04/01/2019   TSH 0.64 01/29/2018    Therapeutic Level Labs: No results found for: LITHIUM No results found for: VALPROATE No components found for:  CBMZ  Current Medications: Current Outpatient Medications  Medication Sig Dispense Refill  . acetaminophen (TYLENOL 8 HOUR) 650 MG CR tablet Take 1 tablet (650 mg total) by mouth every 8 (eight) hours as needed for pain. 42 tablet 0  . ALPRAZolam (XANAX) 1 MG tablet Take 1 tablet (1 mg total) by mouth 3 (three) times daily. 90 tablet 3  . amLODipine (NORVASC) 10 MG tablet TAKE 1 TABLET(10 MG) BY MOUTH DAILY 90 tablet 1  . amLODipine (NORVASC) 10 MG tablet TAKE 1 TABLET(10 MG) BY MOUTH DAILY 90 tablet 1  . Ascorbic Acid (VITAMIN C) 500 MG CAPS Take 500 mg by mouth daily.    . Cholecalciferol (VITAMIN D3 PO) Take by mouth.    . docusate sodium (COLACE) 100 MG capsule Take 100 mg by  mouth 2 (two) times daily.    . ferrous sulfate 325 (65 FE) MG tablet Take 325 mg by mouth 1 day or 1 dose. 60 tablet 3  . montelukast (SINGULAIR) 10 MG tablet Take 1 tablet (10 mg total) by mouth at bedtime. 30 tablet 3  . MYRBETRIQ 25 MG TB24 tablet TAKE 1 TABLET(25 MG) BY MOUTH DAILY 30 tablet 5  . pantoprazole (PROTONIX) 40 MG tablet TAKE 1 TABLET(40 MG) BY MOUTH DAILY 90 tablet 1  . promethazine-dextromethorphan (PROMETHAZINE-DM) 6.25-15 MG/5ML syrup Take one teaspoon at bedtime at bedtime as needed, for excess cough 240 mL 0  . zolpidem (AMBIEN) 10 MG tablet TAKE 1 TABLET(10 MG) BY MOUTH AT BEDTIME AS NEEDED FOR SLEEP 30 tablet 3   No current facility-administered medications for this visit.     Musculoskeletal: Strength & Muscle Tone: within normal limits Gait & Station: normal Patient leans: N/A  Psychiatric Specialty Exam: Review of Systems  Musculoskeletal: Positive for arthralgias.  All other systems reviewed and are negative.   There were no vitals taken for this visit.There is no height or weight on file to calculate BMI.  General Appearance: NA  Eye Contact:  NA  Speech:  Clear and Coherent  Volume:  Normal  Mood:  Euthymic  Affect:  NA  Thought Process:  Goal Directed  Orientation:  Full (Time, Place, and Person)  Thought Content: WDL   Suicidal Thoughts:  No  Homicidal Thoughts:  No  Memory:  Immediate;   Good Recent;   Good Remote;   Fair  Judgement:  Good  Insight:  Fair  Psychomotor Activity:  Normal  Concentration:  Concentration: Good and Attention Span: Good  Recall:  Good  Fund of Knowledge: Good  Language: Good  Akathisia:  No  Handed:  Right  AIMS (if indicated): not done  Assets:  Communication Skills Desire for Improvement Resilience Social Support Talents/Skills  ADL's:  Intact  Cognition:  WNL  Sleep:  Good   Screenings: GAD-7   Flowsheet Row Office Visit from 12/19/2019 in [facility] Office Visit from 06/04/2019 in [facility] Phone Follow Up from 05/25/2017 in [facility]  Total GAD-7 Score 0 15 13    PHQ2-9   [place] Office Visit from 02/20/2020 in [facility] Office Visit from 12/19/2019 in [facility] Office Visit from 10/08/2019 in [facility] Office Visit from 10/02/2019 in [facility] Visit from 06/04/2019 in [facility]  PHQ-2 Total Score 3 0 1 3 5   PHQ-9 Total Score 3 - - 5 13       Assessment and Plan: This patient is a 60 year old female with a history of anxiety and insomnia.  She continues to do well on her current regimen.  She will continue Xanax 1 mg up to 3 times daily for anxiety as well as Ambien 10 mg at bedtime for sleep.  She will return to see me in 4 months   Levonne Spiller, MD 04/13/2020, 11:17 AM

## 2020-04-14 ENCOUNTER — Other Ambulatory Visit: Payer: Self-pay

## 2020-04-14 MED ORDER — MONTELUKAST SODIUM 10 MG PO TABS: 10.0000 mg | ORAL_TABLET | Freq: Every day | ORAL | 5 refills | Status: DC

## 2020-04-14 MED ORDER — MIRABEGRON ER 25 MG PO TB24: ORAL_TABLET | ORAL | 5 refills | Status: DC

## 2020-04-14 MED ORDER — PANTOPRAZOLE SODIUM 40 MG PO TBEC: DELAYED_RELEASE_TABLET | ORAL | 1 refills | Status: DC

## 2020-04-14 MED ORDER — AMLODIPINE BESYLATE 10 MG PO TABS: ORAL_TABLET | ORAL | 1 refills | Status: DC

## 2020-04-24 ENCOUNTER — Other Ambulatory Visit: Payer: Self-pay

## 2020-04-24 ENCOUNTER — Ambulatory Visit
Admission: RE | Admit: 2020-04-24 | Discharge: 2020-04-24 | Disposition: A | Discharge status: Home / Self Care | Payer: Medicare Other | Source: Ambulatory Visit | Attending: Family Medicine | Admitting: Family Medicine

## 2020-04-24 DIAGNOSIS — Z1231 Encounter for screening mammogram for malignant neoplasm of breast: Secondary | ICD-10-CM | POA: Diagnosis not present

## 2020-05-04 ENCOUNTER — Encounter: Payer: Self-pay | Admitting: Obstetrics & Gynecology

## 2020-05-04 ENCOUNTER — Other Ambulatory Visit: Payer: Self-pay

## 2020-05-04 ENCOUNTER — Ambulatory Visit (INDEPENDENT_AMBULATORY_CARE_PROVIDER_SITE_OTHER): Payer: Medicare Other | Admitting: Obstetrics & Gynecology

## 2020-05-04 VITALS — BP 136/91 | HR 97 | Ht 67.0 in | Wt 240.0 lb

## 2020-05-04 DIAGNOSIS — N993 Prolapse of vaginal vault after hysterectomy: Secondary | ICD-10-CM

## 2020-05-04 DIAGNOSIS — Z4689 Encounter for fitting and adjustment of other specified devices: Secondary | ICD-10-CM

## 2020-05-04 NOTE — Progress Notes (Signed)
Chief Complaint  Patient presents with  . Pessary Check    Blood pressure (!) 136/91, pulse 97, height 5\' 7"  (1.702 m), weight 240 lb (108.9 kg).  Jenna Woods presents today for routine follow up related to her pessary.   She uses a Milex ring with support #2 She reports no vaginal discharge and no vaginal bleeding   Likert scale(1 not bothersome -5 very bothersome)  :  1  Exam reveals no undue vaginal mucosal pressure of breakdown, no discharge and no vaginal bleeding.  Vaginal Epithelial Abnormality Classification System:   0 0    No abnormalities 1    Epithelial erythema 2    Granulation tissue 3    Epithelial break or erosion, 1 cm or less 4    Epithelial break or erosion, 1 cm or greater  The pessary is removed, cleaned and replaced without difficulty.      ICD-10-CM   1. Pessary maintenance, Milex ring with support #2, fitting 3/21  Z46.89   2. Pelvic relaxation due to vaginal vault prolapse, posthysterectomy, Milex ring with support #2  P95.5      Jenna Woods will be sen back in 4 months for continued follow up.  Florian Buff, MD  05/04/2020 11:45 AM

## 2020-05-06 ENCOUNTER — Telehealth (HOSPITAL_COMMUNITY): Payer: Self-pay

## 2020-05-06 NOTE — Telephone Encounter (Signed)
Medication management - Telephone call with pt after she left one that her [facility] pharmacy was showing no refills on her Ambien. Informed this nurse spoke to [place], pharmacist to verify they have the new order from 04/13/20 + 3 refills and that this could be filled on tomorrow, 05/07/20 per pharmacist report.  Verified the order was at the [facility] on [address]. Patient to call back if any further problems filling the order.

## 2020-05-07 ENCOUNTER — Ambulatory Visit (INDEPENDENT_AMBULATORY_CARE_PROVIDER_SITE_OTHER): Payer: Medicare Other

## 2020-05-07 ENCOUNTER — Other Ambulatory Visit: Payer: Self-pay

## 2020-05-07 VITALS — BP 113/81 | Ht 66.0 in | Wt 231.0 lb

## 2020-05-07 DIAGNOSIS — Z0000 Encounter for general adult medical examination without abnormal findings: Secondary | ICD-10-CM

## 2020-05-07 DIAGNOSIS — Z135 Encounter for screening for eye and ear disorders: Secondary | ICD-10-CM

## 2020-05-07 NOTE — Progress Notes (Signed)
Subjective:   Jenna Woods is a 60 y.o. female who presents for Medicare Annual (Subsequent) preventive examination.  Review of Systems       Objective:    There were no vitals filed for this visit. There is no height or weight on file to calculate BMI.  Advanced Directives 01/04/2019 04/23/2018 04/17/2017 03/30/2017 07/27/2016 07/25/2016 07/25/2016  Does Patient Have a Medical Advance Directive? No No Yes No Yes Yes Yes  Type of Advance Directive - - - - - Living will Living will  Does patient want to make changes to medical advance directive? - - No - Patient declined - - No - Patient declined -  Copy of [place] in Chart? - - - - - - -  Would patient like information on creating a medical advance directive? Yes (MAU/Ambulatory/Procedural Areas - Information given) Yes (MAU/Ambulatory/Procedural Areas - Information given) - No - Patient declined - - -  Pre-existing out of facility DNR order (yellow form or pink MOST form) - - - - - - -    Current Medications (verified) Outpatient Encounter Medications as of 05/07/2020  Medication Sig  . acetaminophen (TYLENOL 8 HOUR) 650 MG CR tablet Take 1 tablet (650 mg total) by mouth every 8 (eight) hours as needed for pain.  Marland Kitchen ALPRAZolam (XANAX) 1 MG tablet Take 1 tablet (1 mg total) by mouth 3 (three) times daily.  Marland Kitchen amLODipine (NORVASC) 10 MG tablet TAKE 1 TABLET(10 MG) BY MOUTH DAILY  . amLODipine (NORVASC) 10 MG tablet TAKE 1 TABLET(10 MG) BY MOUTH DAILY  . Ascorbic Acid (VITAMIN C) 500 MG CAPS Take 500 mg by mouth daily.  . Cholecalciferol (VITAMIN D3 PO) Take by mouth.  . docusate sodium (COLACE) 100 MG capsule Take 100 mg by mouth 2 (two) times daily.  . ferrous sulfate 325 (65 FE) MG tablet Take 325 mg by mouth 1 day or 1 dose.  . mirabegron ER (MYRBETRIQ) 25 MG TB24 tablet TAKE 1 TABLET(25 MG) BY MOUTH DAILY  . montelukast (SINGULAIR) 10 MG tablet Take 1 tablet (10 mg total) by mouth at bedtime.  . pantoprazole  (PROTONIX) 40 MG tablet TAKE 1 TABLET(40 MG) BY MOUTH DAILY  . promethazine-dextromethorphan (PROMETHAZINE-DM) 6.25-15 MG/5ML syrup Take one teaspoon at bedtime at bedtime as needed, for excess cough  . zolpidem (AMBIEN) 10 MG tablet TAKE 1 TABLET(10 MG) BY MOUTH AT BEDTIME AS NEEDED FOR SLEEP   No facility-administered encounter medications on file as of 05/07/2020.    Allergies (verified) Bee venom, Sesame oil, Aspirin, Molds & smuts, Zofran [ondansetron], and Codeine   History: Past Medical History:  Diagnosis Date  . Allergic reaction   . Allergy    perrenial   . Anxiety   . Arthritis   . Depression    h/o suicidal ideation in 2011  . Depression with anxiety   . Diverticulosis of colon 12/19/2015  . Diverticulosis of colon with hemorrhage 12/19/2015  . GERD (gastroesophageal reflux disease)   . Hypertension 1996  . Obesity   . Peripheral vascular disease ([place])   . Prediabetes 2013  . Raynauds syndrome   . Scleroderma ([place]) 1998  . Systemic lupus erythematosus ([place]) 1998   [facility] at [facility]  . Tobacco abuse, in remission    10-pack-years discontinued in 2007  . Urinary frequency    Past Surgical History:  Procedure Laterality Date  . ABDOMINAL HYSTERECTOMY  1990   Neoplasm  . CESAREAN SECTION     x2  .  COLONOSCOPY  2005  . COLONOSCOPY WITH PROPOFOL N/A 08/28/2015   Dr. Laural Golden: scattered medium-mouth diverticula entire colon, external hemorrhoids  . COLONOSCOPY WITH PROPOFOL N/A 07/27/2016   Procedure: COLONOSCOPY WITH PROPOFOL;  Surgeon: Rogene Houston, MD;  Location[facility];  Service: Endoscopy;  Laterality: N/A;  . FINGER AMPUTATION     Third finger, bilateral, distal  . INCISIONAL HERNIA REPAIR N/A 01/09/2019   Procedure: HERNIA REPAIR INCISIONAL;  Surgeon: Virl Cagey, MD;  Location: [facility];  Service: General;  Laterality: N/A;  . INSERTION OF MESH N/A 01/09/2019   Procedure: INSERTION OF MESH;  Surgeon: Virl Cagey, MD;  Location: [facility];   Service: General;  Laterality: N/A;  . NASAL SINUS SURGERY  09/06/2011   Procedure: ENDOSCOPIC SINUS SURGERY;  Surgeon: Ascencion Dike, MD;  Location: [place];  Service: ENT;  Laterality: N/A;  Nasal cerebrospinal fluid leak repair with Left temporalis fascia graft and Left Ear cartilage graft  . VASCULAR SURGERY     Hands, bilaterally, [facility]; right hand partial amputation of middle finger.   Family History  Problem Relation Age of Onset  . Arthritis Mother        Rheumatoid  . Dementia Mother   . Hypertension Mother   . Cirrhosis Father   . Alcohol abuse Father   . Arthritis Maternal Grandmother   . Drug abuse Sister   . Scleroderma Brother   . Graves' disease Son   . Colon cancer Neg Hx    Social History   Socioeconomic History  . Marital status: Single    Spouse name: Not on file  . Number of children: 2  . Years of education: 65  . Highest education level: 12th grade  Occupational History  . Occupation: Art therapist: [employer]    Comment: Disability awarded in [place] Use  . Smoking status: Former Smoker    Packs/day: 0.25    Years: 25.00    Pack years: 6.25    Types: Cigarettes    Quit date: 04/07/2001    Years since quitting: 19.0  . Smokeless tobacco: Never Used  . Tobacco comment: quit 88 days ago!!  Vaping Use  . Vaping Use: Never used  Substance and Sexual Activity  . Alcohol use: No    Alcohol/week: 0.0 standard drinks    Comment: quit in 2004  . Drug use: No    Comment: use to use pot   . Sexual activity: Not Currently    Birth control/protection: Surgical    Comment: hyst  Other Topics Concern  . Not on file  Social History Narrative   Currently living with son in [place], moving back soon    Social Determinants of Health   Financial Resource Strain: Low Risk   . Difficulty of Paying Living Expenses: Not hard at all  Food Insecurity: No Food Insecurity  . Worried About Charity fundraiser  in the Last Year: Never true  . Ran Out of Food in the Last Year: Never true  Transportation Needs: No Transportation Needs  . Lack of Transportation (Medical): No  . Lack of Transportation (Non-Medical): No  Physical Activity: Insufficiently Active  . Days of Exercise per Week: 4 days  . Minutes of Exercise per Session: 30 min  Stress: Stress Concern Present  . Feeling of Stress : To some extent  Social Connections: Socially Isolated  . Frequency of Communication with Friends and Family: More  than three times a week  . Frequency of Social Gatherings with Friends and Family: Once a week  . Attends Religious Services: Never  . Active Member of Clubs or Organizations: No  . Attends Archivist Meetings: Never  . Marital Status: Divorced    Tobacco Counseling Counseling given: Not Answered Comment: quit 88 days ago!!   Clinical Intake:                 Diabetic? no         Activities of Daily Living No flowsheet data found.  Patient Care Team: Fayrene Helper, MD as PCP - General Harrington Challenger Su Ley, MD as Consulting Physician [facility])  Indicate any recent Medical Services you may have received from other than [facility] providers in the past year (date may be approximate).     Assessment:   This is a routine wellness examination for Jennine.  Hearing/Vision screen No exam data present  Dietary issues and exercise activities discussed:    Goals    . Increase physical activity     Patient would like to increase her exercise to 5 days a week.     . Quit smoking / using tobacco      Depression Screen PHQ 2/9 Scores 02/20/2020 12/19/2019 10/08/2019 10/02/2019 06/04/2019 05/07/2019 04/16/2019  PHQ - 2 Score 3 0 1 3 5  0 0  PHQ- 9 Score 3 - - 5 13 - -    Fall Risk Fall Risk  02/20/2020 01/02/2020 12/19/2019 10/08/2019 10/02/2019  Falls in the past year? 0 1 1 0 0  Number falls in past yr: 0 0 0 - 0  Injury with Fall? 0 1 1 - 0  Risk for fall due to  : - - Impaired balance/gait - -  Follow up - - Falls evaluation completed - -    FALL RISK PREVENTION PERTAINING TO THE HOME:  Any stairs in or around the home? No  If so, are there any without handrails? n/a Home free of loose throw rugs in walkways, pet beds, electrical cords, etc? Yes  Adequate lighting in your home to reduce risk of falls? Yes   ASSISTIVE DEVICES UTILIZED TO PREVENT FALLS:   Life alert? Yes  Use of a cane, walker or w/c? No  Grab bars in the bathroom? Yes  Shower chair or bench in shower? No  Elevated toilet seat or a handicapped toilet? No   TIMED UP AND GO:  Was the test performed? No .  Length of time to ambulate n/a   Cognitive Function:     6CIT Screen 05/07/2019 04/23/2018 04/17/2017 04/18/2016  What Year? 0 points 0 points 0 points 0 points  What month? 0 points 0 points 0 points 0 points  What time? 0 points 0 points 0 points 0 points  Count back from 20 0 points 0 points 0 points 0 points  Months in reverse 0 points 0 points 0 points 0 points  Repeat phrase 0 points 0 points 0 points 0 points  Total Score 0 0 0 0    Immunizations Immunization History  Administered Date(s) Administered  . Influenza Inj Mdck Quad Pf 11/28/2018  . Influenza Split 01/10/2012  . Influenza Whole 12/04/2009, 12/29/2010  . Influenza,inj,Quad PF,6+ Mos 11/29/2012, 10/28/2013, 11/18/2014, 12/15/2015, 12/20/2016, 12/07/2017, 11/28/2018, 12/05/2019  . Moderna Sars-Covid-2 Vaccination 05/18/2019, 06/19/2019, 11/07/2019  . PPD Test 11/09/2011  . Tdap 12/29/2010  . Zoster Recombinat (Shingrix) 01/24/2019, 04/01/2019    TDAP status: Up to date  Flu Vaccine status: Up to date  Pneumococcal vaccine status: Declined,  Education has been provided regarding the importance of this vaccine but patient still declined. Advised may receive this vaccine at local pharmacy or Health Dept. Aware to provide a copy of the vaccination record if obtained from local pharmacy or Health  Dept. Verbalized acceptance and understanding.   Covid-19 vaccine status: Completed vaccines  Qualifies for Shingles Vaccine? Yes   Zostavax completed No   Shingrix Completed?: Yes  Screening Tests Health Maintenance  Topic Date Due  . TETANUS/TDAP  12/28/2020  . MAMMOGRAM  04/24/2022  . COLONOSCOPY (Pts 45-63yrs Insurance coverage will need to be confirmed)  07/28/2026  . INFLUENZA VACCINE  Completed  . COVID-19 Vaccine  Completed  . Hepatitis C Screening  Completed  . HIV Screening  Completed  . HPV VACCINES  Aged Out  . PAP SMEAR-Modifier  Discontinued    Health Maintenance  There are no preventive care reminders to display for this patient.  Colorectal cancer screening: Type of screening: Colonoscopy. Completed 07/27/16. Repeat every 5 years  Mammogram status: Completed 04/24/20. Repeat every year   Lung Cancer Screening: (Low Dose CT Chest recommended if Age 20-80 years, 30 pack-year currently smoking OR have quit w/in 15years.) does not qualify.   Lung Cancer Screening Referral: n/a  Additional Screening:  Hepatitis C Screening: does not qualify; Completed  Vision Screening: Recommended annual ophthalmology exams for early detection of glaucoma and other disorders of the eye. Is the patient up to date with their annual eye exam?  No  Who is the provider or what is the name of the office in which the patient attends annual eye exams? n/a If pt is not established with a provider, would they like to be referred to a provider to establish care? Yes .   Dental Screening: Recommended annual dental exams for proper oral hygiene  Community Resource Referral / Chronic Care Management: CRR required this visit?  No   CCM required this visit?  No      Plan:     I have personally reviewed and noted the following in the patient's chart:   . Medical and social history . Use of alcohol, tobacco or illicit drugs  . Current medications and supplements . Functional  ability and status . Nutritional status . Physical activity . Advanced directives . List of other physicians . Hospitalizations, surgeries, and ER visits in previous 12 months . Vitals . Screenings to include cognitive, depression, and falls . Referrals and appointments  In addition, I have reviewed and discussed with patient certain preventive protocols, quality metrics, and best practice recommendations. A written personalized care plan for preventive services as well as general preventive health recommendations were provided to patient.     Laretta Bolster, [place]   [id number]   Nurse Notes: AWV conducted by nurse in office by phone. Patient gave consent to telehealth visit via audio. Patient at home at the time of this visit. Provider here in the office at the time of this visit. Visit took 30 minutes to complete.

## 2020-05-07 NOTE — Patient Instructions (Addendum)
Jenna Woods , Thank you for taking time to come for your Medicare Wellness Visit. I appreciate your ongoing commitment to your health goals. Please review the following plan we discussed and let me know if I can assist you in the future.   Screening recommendations/referrals: Colonoscopy: 07/28/26 Mammogram: 04/24/21 Bone Density: Not due Recommended yearly ophthalmology/optometry visit for glaucoma screening and checkup Recommended yearly dental visit for hygiene and checkup  Vaccinations: Influenza vaccine: Fall 2022 Pneumococcal vaccine: Not complete Tdap vaccine: 12/28/20 Shingles vaccine: Complete  Advanced directives: POA and Living Will  Conditions/risks identified: No  Next appointment: 07/23/20 @ 10:40 am  Preventive Care 40-64 Years, Female Preventive care refers to lifestyle choices and visits with your health care provider that can promote health and wellness. What does preventive care include?  A yearly physical exam. This is also called an annual well check.  Dental exams once or twice a year.  Routine eye exams. Ask your health care provider how often you should have your eyes checked.  Personal lifestyle choices, including:  Daily care of your teeth and gums.  Regular physical activity.  Eating a healthy diet.  Avoiding tobacco and drug use.  Limiting alcohol use.  Practicing safe sex.  Taking low-dose aspirin daily starting at age 53.  Taking vitamin and mineral supplements as recommended by your health care provider. What happens during an annual well check? The services and screenings done by your health care provider during your annual well check will depend on your age, overall health, lifestyle risk factors, and family history of disease. Counseling  Your health care provider may ask you questions about your:  Alcohol use.  Tobacco use.  Drug use.  Emotional well-being.  Home and relationship well-being.  Sexual activity.  Eating  habits.  Work and work Statistician.  Method of birth control.  Menstrual cycle.  Pregnancy history. Screening  You may have the following tests or measurements:  Height, weight, and BMI.  Blood pressure.  Lipid and cholesterol levels. These may be checked every 5 years, or more frequently if you are over 28 years old.  Skin check.  Lung cancer screening. You may have this screening every year starting at age 67 if you have a 30-pack-year history of smoking and currently smoke or have quit within the past 15 years.  Fecal occult blood test (FOBT) of the stool. You may have this test every year starting at age 44.  Flexible sigmoidoscopy or colonoscopy. You may have a sigmoidoscopy every 5 years or a colonoscopy every 10 years starting at age 33.  Hepatitis C blood test.  Hepatitis B blood test.  Sexually transmitted disease (STD) testing.  Diabetes screening. This is done by checking your blood sugar (glucose) after you have not eaten for a while (fasting). You may have this done every 1-3 years.  Mammogram. This may be done every 1-2 years. Talk to your health care provider about when you should start having regular mammograms. This may depend on whether you have a family history of breast cancer.  BRCA-related cancer screening. This may be done if you have a family history of breast, ovarian, tubal, or peritoneal cancers.  Pelvic exam and Pap test. This may be done every 3 years starting at age 64. Starting at age 40, this may be done every 5 years if you have a Pap test in combination with an HPV test.  Bone density scan. This is done to screen for osteoporosis. You may have this scan if  you are at high risk for osteoporosis. Discuss your test results, treatment options, and if necessary, the need for more tests with your health care provider. Vaccines  Your health care provider may recommend certain vaccines, such as:  Influenza vaccine. This is recommended every  year.  Tetanus, diphtheria, and acellular pertussis (Tdap, Td) vaccine. You may need a Td booster every 10 years.  Zoster vaccine. You may need this after age 76.  Pneumococcal 13-valent conjugate (PCV13) vaccine. You may need this if you have certain conditions and were not previously vaccinated.  Pneumococcal polysaccharide (PPSV23) vaccine. You may need one or two doses if you smoke cigarettes or if you have certain conditions. Talk to your health care provider about which screenings and vaccines you need and how often you need them. This information is not intended to replace advice given to you by your health care provider. Make sure you discuss any questions you have with your health care provider. Document Released: 03/20/2015 Document Revised: 11/11/2015 Document Reviewed: 12/23/2014 Elsevier Interactive Patient Education  2017 [place] Prevention in the Home Falls can cause injuries. They can happen to people of all ages. There are many things you can do to make your home safe and to help prevent falls. What can I do on the outside of my home?  Regularly fix the edges of walkways and driveways and fix any cracks.  Remove anything that might make you trip as you walk through a door, such as a raised step or threshold.  Trim any bushes or trees on the path to your home.  Use bright outdoor lighting.  Clear any walking paths of anything that might make someone trip, such as rocks or tools.  Regularly check to see if handrails are loose or broken. Make sure that both sides of any steps have handrails.  Any raised decks and porches should have guardrails on the edges.  Have any leaves, snow, or ice cleared regularly.  Use sand or salt on walking paths during winter.  Clean up any spills in your garage right away. This includes oil or grease spills. What can I do in the bathroom?  Use night lights.  Install grab bars by the toilet and in the tub and shower.  Do not use towel bars as grab bars.  Use non-skid mats or decals in the tub or shower.  If you need to sit down in the shower, use a plastic, non-slip stool.  Keep the floor dry. Clean up any water that spills on the floor as soon as it happens.  Remove soap buildup in the tub or shower regularly.  Attach bath mats securely with double-sided non-slip rug tape.  Do not have throw rugs and other things on the floor that can make you trip. What can I do in the bedroom?  Use night lights.  Make sure that you have a light by your bed that is easy to reach.  Do not use any sheets or blankets that are too big for your bed. They should not hang down onto the floor.  Have a firm chair that has side arms. You can use this for support while you get dressed.  Do not have throw rugs and other things on the floor that can make you trip. What can I do in the kitchen?  Clean up any spills right away.  Avoid walking on wet floors.  Keep items that you use a lot in easy-to-reach places.  If you  need to reach something above you, use a strong step stool that has a grab bar.  Keep electrical cords out of the way.  Do not use floor polish or wax that makes floors slippery. If you must use wax, use non-skid floor wax.  Do not have throw rugs and other things on the floor that can make you trip. What can I do with my stairs?  Do not leave any items on the stairs.  Make sure that there are handrails on both sides of the stairs and use them. Fix handrails that are broken or loose. Make sure that handrails are as long as the stairways.  Check any carpeting to make sure that it is firmly attached to the stairs. Fix any carpet that is loose or worn.  Avoid having throw rugs at the top or bottom of the stairs. If you do have throw rugs, attach them to the floor with carpet tape.  Make sure that you have a light switch at the top of the stairs and the bottom of the stairs. If you do not have them,  ask someone to add them for you. What else can I do to help prevent falls?  Wear shoes that:  Do not have high heels.  Have rubber bottoms.  Are comfortable and fit you well.  Are closed at the toe. Do not wear sandals.  If you use a stepladder:  Make sure that it is fully opened. Do not climb a closed stepladder.  Make sure that both sides of the stepladder are locked into place.  Ask someone to hold it for you, if possible.  Clearly mark and make sure that you can see:  Any grab bars or handrails.  First and last steps.  Where the edge of each step is.  Use tools that help you move around (mobility aids) if they are needed. These include:  Canes.  Walkers.  Scooters.  Crutches.  Turn on the lights when you go into a dark area. Replace any light bulbs as soon as they burn out.  Set up your furniture so you have a clear path. Avoid moving your furniture around.  If any of your floors are uneven, fix them.  If there are any pets around you, be aware of where they are.  Review your medicines with your doctor. Some medicines can make you feel dizzy. This can increase your chance of falling. Ask your doctor what other things that you can do to help prevent falls. This information is not intended to replace advice given to you by your health care provider. Make sure you discuss any questions you have with your health care provider. Document Released: 12/18/2008 Document Revised: 07/30/2015 Document Reviewed: 03/28/2014 Elsevier Interactive Patient Education  2017 [employer].

## 2020-06-09 DIAGNOSIS — R194 Change in bowel habit: Secondary | ICD-10-CM | POA: Diagnosis not present

## 2020-06-09 DIAGNOSIS — K5790 Diverticulosis of intestine, part unspecified, without perforation or abscess without bleeding: Secondary | ICD-10-CM | POA: Diagnosis not present

## 2020-06-09 DIAGNOSIS — Z1211 Encounter for screening for malignant neoplasm of colon: Secondary | ICD-10-CM | POA: Diagnosis not present

## 2020-07-21 ENCOUNTER — Other Ambulatory Visit: Payer: Self-pay

## 2020-07-21 ENCOUNTER — Ambulatory Visit: Payer: Self-pay

## 2020-07-21 ENCOUNTER — Ambulatory Visit (INDEPENDENT_AMBULATORY_CARE_PROVIDER_SITE_OTHER): Payer: Medicare Other | Admitting: Family Medicine

## 2020-07-21 DIAGNOSIS — M79675 Pain in left toe(s): Secondary | ICD-10-CM | POA: Diagnosis not present

## 2020-07-21 MED ORDER — TRAMADOL HCL 50 MG PO TABS: 50.0000 mg | ORAL_TABLET | Freq: Four times a day (QID) | ORAL | 0 refills | Status: DC | PRN

## 2020-07-21 NOTE — Progress Notes (Signed)
Office Visit Note   Patient: Jenna Woods           Date of Birth: [birth year]           MRN: [id number] Visit Date: 07/21/2020 Requested by: Fayrene Helper, MD [address] [place],  [place] [address] PCP: Fayrene Helper, MD  Subjective: Chief Complaint  Patient presents with  . Left 5th Toe - Pain    Hit her little toe on the bed on 07/17/20. She buddy-taped it. It is throbbing constantly - wakes her at night. Taking Tylenol PM at night and Tylenol this morning.     HPI: She is here with left fifth toe pain.  On May 13 she was walking barefoot into the room and hit her toe on the edge of the bed.  Her toe was bent laterally and she pushed it back in place.  It has been aching and throbbing since then.  She has a history of toe fracture years ago which healed without complication.  She has a history of lupus which is currently under control.               ROS:   All other systems were reviewed and are negative.  Objective: Vital Signs: There were no vitals taken for this visit.  Physical Exam:  General:  Alert and oriented, in no acute distress. Pulm:  Breathing unlabored. Psy:  Normal mood, congruent affect. Skin: Mild bruising near the left fifth toe. Left foot: She is tender to palpation of the fourth and fifth toes.  There is no rotational deformity, and flexor and extensor tendon functions are intact.  No tenderness to palpation of the metatarsals.  Imaging: No results found.  Assessment & Plan: 1.  4 days status post left fifth toe contusion with probable radial collateral ligament sprain, but cannot rule out fracture. -Postop shoe, tramadol as needed.  Anticipate 3 to 6 weeks healing time if ligament injury and 6-12 if occult fracture.  If she is not feeling better in 3 weeks she will come back in for repeat 2 view toe x-rays.     Procedures: No procedures performed        PMFS History: Patient Active Problem List   Diagnosis Date  Noted  . Seasonal allergies 02/23/2020  . Allergic reaction caused by a drug 10/08/2019  . Abdominal pain 10/02/2019  . Constipation 10/02/2019  . Rectocele 04/16/2019  . Pelvic relaxation due to cystocele, midline 04/16/2019  . Urinary frequency 04/16/2019  . Incisional hernia without obstruction or gangrene   . Umbilical hernia 41/93/7902  . Autoimmune disease ([place]) 09/10/2017  . Insomnia due to mental disorder 09/15/2014  . Major depression 08/29/2014  . Osteoarthritis of left knee 08/05/2013  . Depression with anxiety 04/02/2013  . Scleroderma ([place]) 02/05/2013  . Raynaud's phenomenon 02/05/2013  . Lupus ([place]) 02/05/2013  . Diffuse connective tissue disease ([place]) 02/05/2013  . IGT (impaired glucose tolerance) 06/05/2012  . Vitamin D deficiency 09/23/2011  . Urinary incontinence 10/26/2010  . GERD (gastroesophageal reflux disease)   . Systemic lupus erythematosus ([place])   . Morbid obesity ([place]) 04/02/2009  . Essential hypertension 04/02/2009   Past Medical History:  Diagnosis Date  . Allergic reaction   . Allergy    perrenial   . Anxiety   . Arthritis   . Depression    h/o suicidal ideation in 2011  . Depression with anxiety   . Diverticulosis of colon 12/19/2015  . Diverticulosis  of colon with hemorrhage 12/19/2015  . GERD (gastroesophageal reflux disease)   . Hypertension 1996  . Obesity   . Peripheral vascular disease ([place])   . Prediabetes 2013  . Raynauds syndrome   . Scleroderma ([place]) 1998  . Systemic lupus erythematosus ([place]) 1998   T[facility]  . Tobacco abuse, in remission    10-pack-years discontinued in 2007  . Urinary frequency     Family History  Problem Relation Age of Onset  . Arthritis Mother        Rheumatoid  . Dementia Mother   . Hypertension Mother   . Cirrhosis Father   . Alcohol abuse Father   . Arthritis Maternal Grandmother   . Drug abuse Sister   . Scleroderma Brother   . Graves' disease Son   . Colon cancer Neg Hx      Past Surgical History:  Procedure Laterality Date  . ABDOMINAL HYSTERECTOMY  1990   Neoplasm  . CESAREAN SECTION     x2  . COLONOSCOPY  2005  . COLONOSCOPY WITH PROPOFOL N/A 08/28/2015   Dr. Laural [place]: scattered medium-mouth diverticula entire colon, external hemorrhoids  . COLONOSCOPY WITH PROPOFOL N/A 07/27/2016   Procedure: COLONOSCOPY WITH PROPOFOL;  Surgeon: Rogene Houston, MD;  [facility];  Service: Endoscopy;  Laterality: N/A;  . FINGER AMPUTATION     Third finger, bilateral, distal  . INCISIONAL HERNIA REPAIR N/A 01/09/2019   Procedure: HERNIA REPAIR INCISIONAL;  Surgeon: Virl Cagey, MD;  Location: [facility];  Service: General;  Laterality: N/A;  . INSERTION OF MESH N/A 01/09/2019   Procedure: INSERTION OF MESH;  Surgeon: Virl Cagey, MD;  Location: [facility];  Service: General;  Laterality: N/A;  . NASAL SINUS SURGERY  09/06/2011   Procedure: ENDOSCOPIC SINUS SURGERY;  Surgeon: Ascencion Dike, MD;  Location: [place];  Service: ENT;  Laterality: N/A;  Nasal cerebrospinal fluid leak repair with Left temporalis fascia graft and Left Ear cartilage graft  . VASCULAR SURGERY     Hands, bilaterally, [facility]; right hand partial amputation of middle finger.   Social History   Occupational History  . Occupation: Art therapist: [employer]    Comment: Disability awarded in [place] Use  . Smoking status: Former Smoker    Packs/day: 0.25    Years: 25.00    Pack years: 6.25    Types: Cigarettes    Quit date: 04/07/2001    Years since quitting: 19.3  . Smokeless tobacco: Never Used  . Tobacco comment: quit 88 days ago!!  Vaping Use  . Vaping Use: Never used  Substance and Sexual Activity  . Alcohol use: No    Alcohol/week: 0.0 standard drinks    Comment: quit in 2004  . Drug use: No    Comment: use to use pot   . Sexual activity: Not Currently    Birth control/protection: Surgical    Comment: hyst

## 2020-07-23 ENCOUNTER — Ambulatory Visit: Payer: Medicare Other | Admitting: Family Medicine

## 2020-07-23 DIAGNOSIS — H6123 Impacted cerumen, bilateral: Secondary | ICD-10-CM | POA: Diagnosis not present

## 2020-07-23 DIAGNOSIS — H900 Conductive hearing loss, bilateral: Secondary | ICD-10-CM | POA: Diagnosis not present

## 2020-08-11 ENCOUNTER — Ambulatory Visit: Payer: Medicare Other | Admitting: Family Medicine

## 2020-08-11 ENCOUNTER — Other Ambulatory Visit: Payer: Self-pay

## 2020-08-11 ENCOUNTER — Telehealth (INDEPENDENT_AMBULATORY_CARE_PROVIDER_SITE_OTHER): Payer: Medicare Other | Admitting: Psychiatry

## 2020-08-11 ENCOUNTER — Encounter (HOSPITAL_COMMUNITY): Payer: Self-pay | Admitting: Psychiatry

## 2020-08-11 DIAGNOSIS — F411 Generalized anxiety disorder: Secondary | ICD-10-CM | POA: Diagnosis not present

## 2020-08-11 MED ORDER — ALPRAZOLAM 1 MG PO TABS: 1.0000 mg | ORAL_TABLET | Freq: Three times a day (TID) | ORAL | 3 refills | Status: DC

## 2020-08-11 MED ORDER — ZOLPIDEM TARTRATE 10 MG PO TABS: ORAL_TABLET | ORAL | 3 refills | Status: DC

## 2020-08-11 NOTE — Progress Notes (Signed)
Virtual Visit via Telephone Note  I connected with Jenna Woods on 75/91/63 at 11:00 AM EDT by telephone and verified that I am speaking with the correct person using two identifiers.  Location: Patient: home Provider: home office   I discussed the limitations, risks, security and privacy concerns of performing an evaluation and management service by telephone and the availability of in person appointments. I also discussed with the patient that there may be a patient responsible charge related to this service. The patient expressed understanding and agreed to proceed.    I discussed the assessment and treatment plan with the patient. The patient was provided an opportunity to ask questions and all were answered. The patient agreed with the plan and demonstrated an understanding of the instructions.   The patient was advised to call back or seek an in-person evaluation if the symptoms worsen or if the condition fails to improve as anticipated.  I provided 15 minutes of non-face-to-face time during this encounter.   Levonne Spiller, MD  [facility] MD/PA/NP OP Progress Note  10/08/6657 93:57 AM Jenna Woods  MRN:  [id number]  Chief Complaint:  Chief Complaint    Anxiety; Follow-up     HPI: This patient is a 60 year old divorced black female who lives alone in [place].  She is on disability.  The patient returns after 4 months regarding her anxiety and difficulty sleeping.  The patient returns for follow-up after 4 months.  She states that she has had more significant disagreements with the landlord.  She states he has been rude and insensitive to her and other residents.  They are having a mediation meeting tomorrow.  For a while this made her quite depressed but she is feeling better now.  She states the Xanax continues to help her anxiety and Ambien continues to help with her sleep.  She denies significant depression or suicidal ideation. Visit Diagnosis:    ICD-10-CM   1. Anxiety  state  F41.1     Past Psychiatric History: none  Past Medical History:  Past Medical History:  Diagnosis Date  . Allergic reaction   . Allergy    perrenial   . Anxiety   . Arthritis   . Depression    h/o suicidal ideation in 2011  . Depression with anxiety   . Diverticulosis of colon 12/19/2015  . Diverticulosis of colon with hemorrhage 12/19/2015  . GERD (gastroesophageal reflux disease)   . Hypertension 1996  . Obesity   . Peripheral vascular disease ([place])   . Prediabetes 2013  . Raynauds syndrome   . Scleroderma ([place]) 1998  . Systemic lupus erythematosus ([place]) 1998   T[facility] at [facility]  . Tobacco abuse, in remission    10-pack-years discontinued in 2007  . Urinary frequency     Past Surgical History:  Procedure Laterality Date  . ABDOMINAL HYSTERECTOMY  1990   Neoplasm  . CESAREAN SECTION     x2  . COLONOSCOPY  2005  . COLONOSCOPY WITH PROPOFOL N/A 08/28/2015   Dr. Laural [place]: scattered medium-mouth diverticula entire colon, external hemorrhoids  . COLONOSCOPY WITH PROPOFOL N/A 07/27/2016   Procedure: COLONOSCOPY WITH PROPOFOL;  Surgeon: Rogene Houston, MD;  [facility];  Service: Endoscopy;  Laterality: N/A;  . FINGER AMPUTATION     Third finger, bilateral, distal  . INCISIONAL HERNIA REPAIR N/A 01/09/2019   Procedure: HERNIA REPAIR INCISIONAL;  Surgeon: Virl Cagey, MD;  Location[facility];  Service: General;  Laterality: N/A;  . INSERTION  OF MESH N/A 01/09/2019   Procedure: INSERTION OF MESH;  Surgeon: Virl Cagey, MD;  Location[facility];  Service: General;  Laterality: N/A;  . NASAL SINUS SURGERY  09/06/2011   Procedure: ENDOSCOPIC SINUS SURGERY;  Surgeon: Ascencion Dike, MD;  Location: [place];  Service: ENT;  Laterality: N/A;  Nasal cerebrospinal fluid leak repair with Left temporalis fascia graft and Left Ear cartilage graft  . VASCULAR SURGERY     Hands, bilaterally, [facility]; right hand partial amputation of middle  finger.    Family Psychiatric History: see below  Family History:  Family History  Problem Relation Age of Onset  . Arthritis Mother        Rheumatoid  . Dementia Mother   . Hypertension Mother   . Cirrhosis Father   . Alcohol abuse Father   . Arthritis Maternal Grandmother   . Drug abuse Sister   . Scleroderma Brother   . Graves' disease Son   . Colon cancer Neg Hx     Social History:  Social History   Socioeconomic History  . Marital status: Single    Spouse name: Not on file  . Number of children: 2  . Years of education: 70  . Highest education level: 12th grade  Occupational History  . Occupation: Art therapist: [employer]    Comment: Disability awarded in [place] Use  . Smoking status: Former Smoker    Packs/day: 0.25    Years: 25.00    Pack years: 6.25    Types: Cigarettes    Quit date: 04/07/2001    Years since quitting: 19.3  . Smokeless tobacco: Never Used  . Tobacco comment: quit 88 days ago!!  Vaping Use  . Vaping Use: Never used  Substance and Sexual Activity  . Alcohol use: No    Alcohol/week: 0.0 standard drinks    Comment: quit in 2004  . Drug use: No    Comment: use to use pot   . Sexual activity: Not Currently    Birth control/protection: Surgical    Comment: hyst  Other Topics Concern  . Not on file  Social History Narrative   Currently living with son in [place], moving back soon    Social Determinants of Health   Financial Resource Strain: Low Risk   . Difficulty of Paying Living Expenses: Not hard at all  Food Insecurity: No Food Insecurity  . Worried About Charity fundraiser in the Last Year: Never true  . Ran Out of Food in the Last Year: Never true  Transportation Needs: No Transportation Needs  . Lack of Transportation (Medical): No  . Lack of Transportation (Non-Medical): No  Physical Activity: Sufficiently Active  . Days of Exercise per Week: 7 days  . Minutes of Exercise per Session: 60  min  Stress: No Stress Concern Present  . Feeling of Stress : Not at all  Social Connections: Moderately Isolated  . Frequency of Communication with Friends and Family: More than three times a week  . Frequency of Social Gatherings with Friends and Family: More than three times a week  . Attends Religious Services: More than 4 times per year  . Active Member of Clubs or Organizations: No  . Attends Archivist Meetings: Never  . Marital Status: Divorced    Allergies:  Allergies  Allergen Reactions  . Bee Venom Anaphylaxis  . Sesame Oil Anaphylaxis and Swelling    (Sesame seed)  .  Aspirin     Diverticulosis with bleed  . Molds & Smuts   . Zofran [Ondansetron] Itching    Generalized itching and had a rash on her thighs  . Codeine Itching and Rash    Metabolic Disorder Labs: Lab Results  Component Value Date   HGBA1C 5.9 (H) 04/01/2019   MPG 123 04/01/2019   MPG 126 09/03/2018   No results found for: PROLACTIN Lab Results  Component Value Date   CHOL 145 04/01/2019   TRIG 91 04/01/2019   HDL 50 04/01/2019   CHOLHDL 2.9 04/01/2019   VLDL 15 06/30/2016   LDLCALC 78 04/01/2019   LDLCALC 68 09/03/2018   Lab Results  Component Value Date   TSH 1.71 04/01/2019   TSH 0.64 01/29/2018    Therapeutic Level Labs: No results found for: LITHIUM No results found for: VALPROATE No components found for:  CBMZ  Current Medications: Current Outpatient Medications  Medication Sig Dispense Refill  . acetaminophen (TYLENOL 8 HOUR) 650 MG CR tablet Take 1 tablet (650 mg total) by mouth every 8 (eight) hours as needed for pain. 42 tablet 0  . ALPRAZolam (XANAX) 1 MG tablet Take 1 tablet (1 mg total) by mouth 3 (three) times daily. 90 tablet 3  . amLODipine (NORVASC) 10 MG tablet TAKE 1 TABLET(10 MG) BY MOUTH DAILY 90 tablet 1  . Ascorbic Acid (VITAMIN C) 500 MG CAPS Take 500 mg by mouth daily.    . Cholecalciferol (VITAMIN D3 PO) Take by mouth.    . docusate sodium  (COLACE) 100 MG capsule Take 100 mg by mouth 2 (two) times daily.    . ferrous sulfate 325 (65 FE) MG tablet Take 325 mg by mouth 1 day or 1 dose. 60 tablet 3  . mirabegron ER (MYRBETRIQ) 25 MG TB24 tablet TAKE 1 TABLET(25 MG) BY MOUTH DAILY 30 tablet 5  . montelukast (SINGULAIR) 10 MG tablet Take 1 tablet (10 mg total) by mouth at bedtime. 30 tablet 5  . pantoprazole (PROTONIX) 40 MG tablet TAKE 1 TABLET(40 MG) BY MOUTH DAILY 90 tablet 1  . traMADol (ULTRAM) 50 MG tablet Take 1 tablet (50 mg total) by mouth every 6 (six) hours as needed. 30 tablet 0  . zolpidem (AMBIEN) 10 MG tablet TAKE 1 TABLET(10 MG) BY MOUTH AT BEDTIME AS NEEDED FOR SLEEP 30 tablet 3   No current facility-administered medications for this visit.     Musculoskeletal: Strength & Muscle Tone: within normal limits Gait & Station: normal Patient leans: N/A  Psychiatric Specialty Exam: Review of Systems  Psychiatric/Behavioral: The patient is nervous/anxious.   All other systems reviewed and are negative.   There were no vitals taken for this visit.There is no height or weight on file to calculate BMI.  General Appearance: NA  Eye Contact:  NA  Speech:  Clear and Coherent  Volume:  Normal  Mood:  Anxious and Euthymic  Affect:  NA  Thought Process:  Goal Directed  Orientation:  Full (Time, Place, and Person)  Thought Content: Rumination   Suicidal Thoughts:  No  Homicidal Thoughts:  No  Memory:  Immediate;   Good Recent;   Good Remote;   Fair  Judgement:  Good  Insight:  Fair  Psychomotor Activity:  Normal  Concentration:  Concentration: Good and Attention Span: Good  Recall:  Good  Fund of Knowledge: Fair  Language: Good  Akathisia:  No  Handed:  Right  AIMS (if indicated): not done  Assets:  Communication Skills  Desire for Improvement Resilience Social Support Talents/Skills  ADL's:  Intact  Cognition: WNL  Sleep:  Good   Screenings: GAD-7   Flowsheet Row Office Visit from 12/19/2019 in  [facility] Office Visit from 06/04/2019 in [facility] Phone Follow Up from 05/25/2017 in [facility]  Total GAD-7 Score 0 15 13    PHQ2-9   Flowsheet Row Clinical Support from 05/07/2020 in [facility] Office Visit from 02/20/2020 in [facility] Office Visit from 12/19/2019 in [facility] Office Visit from 10/08/2019 in [facility] Office Visit from 10/02/2019 in [facility]  PHQ-2 Total Score 0 3 0 1 3  PHQ-9 Total Score -- 3 -- -- 5       Assessment and Plan: This patient is a 60 year old female with a history of anxiety and insomnia.  She continues to do well on her current regimen.  She will continue Xanax 1 mg up to 3 times daily for anxiety as well as Ambien 10 mg at bedtime for sleep.  She will return to see me in 4 months   Levonne Spiller, MD 08/11/2020, 11:19 AM

## 2020-08-13 ENCOUNTER — Ambulatory Visit: Payer: Medicare Other | Admitting: Family Medicine

## 2020-08-24 ENCOUNTER — Encounter: Payer: Self-pay | Admitting: Family Medicine

## 2020-08-24 ENCOUNTER — Other Ambulatory Visit: Payer: Self-pay

## 2020-08-24 ENCOUNTER — Ambulatory Visit (INDEPENDENT_AMBULATORY_CARE_PROVIDER_SITE_OTHER): Payer: Medicare Other | Admitting: Family Medicine

## 2020-08-24 VITALS — BP 126/90 | HR 102 | Temp 98.8°F | Resp 20 | Ht 67.0 in | Wt 235.0 lb

## 2020-08-24 DIAGNOSIS — M329 Systemic lupus erythematosus, unspecified: Secondary | ICD-10-CM

## 2020-08-24 DIAGNOSIS — E559 Vitamin D deficiency, unspecified: Secondary | ICD-10-CM

## 2020-08-24 DIAGNOSIS — K219 Gastro-esophageal reflux disease without esophagitis: Secondary | ICD-10-CM

## 2020-08-24 DIAGNOSIS — N39498 Other specified urinary incontinence: Secondary | ICD-10-CM

## 2020-08-24 DIAGNOSIS — M359 Systemic involvement of connective tissue, unspecified: Secondary | ICD-10-CM

## 2020-08-24 DIAGNOSIS — I10 Essential (primary) hypertension: Secondary | ICD-10-CM

## 2020-08-24 DIAGNOSIS — Z13220 Encounter for screening for lipoid disorders: Secondary | ICD-10-CM | POA: Diagnosis not present

## 2020-08-24 DIAGNOSIS — R7302 Impaired glucose tolerance (oral): Secondary | ICD-10-CM | POA: Diagnosis not present

## 2020-08-24 DIAGNOSIS — F324 Major depressive disorder, single episode, in partial remission: Secondary | ICD-10-CM

## 2020-08-24 DIAGNOSIS — Z23 Encounter for immunization: Secondary | ICD-10-CM | POA: Diagnosis not present

## 2020-08-24 DIAGNOSIS — IMO0002 Reserved for concepts with insufficient information to code with codable children: Secondary | ICD-10-CM

## 2020-08-24 MED ORDER — AMLODIPINE BESYLATE 10 MG PO TABS: 10.0000 mg | ORAL_TABLET | Freq: Every day | ORAL | 3 refills | Status: DC

## 2020-08-24 MED ORDER — MIRABEGRON ER 25 MG PO TB24: ORAL_TABLET | ORAL | 5 refills | Status: DC

## 2020-08-24 MED ORDER — PANTOPRAZOLE SODIUM 40 MG PO TBEC: DELAYED_RELEASE_TABLET | ORAL | 1 refills | Status: DC

## 2020-08-24 NOTE — Patient Instructions (Signed)
Annual exam  in 4 months, call if you need me before  Medications are sent to your new pharmacy ( protonix and myrbetriq)  Pneumonia  vaccine today  Blood pressure slightly high work on weight loss and exercise   Fasting CBC, lipid, cmp and eGFr, TSH and Vit D and HBa1C [facility] in [place] this week  It is important that you exercise regularly at least 30 minutes 5 times a week. If you develop chest pain, have severe difficulty breathing, or feel very tired, stop exercising immediately and seek medical attention    Thanks for choosing [facility], we consider it a privelige to serve you.

## 2020-08-25 ENCOUNTER — Encounter: Payer: Self-pay | Admitting: Family Medicine

## 2020-08-25 NOTE — Assessment & Plan Note (Signed)
Controlled, no change in medication  

## 2020-08-25 NOTE — Assessment & Plan Note (Signed)
Patient educated about the importance of limiting  Carbohydrate intake , the need to commit to daily physical activity for a minimum of 30 minutes , and to commit weight loss. The fact that changes in all these areas will reduce or eliminate all together the development of diabetes is stressed.   Diabetic Labs Latest Ref Rng & Units 10/02/2019 04/01/2019 11/01/2018 09/03/2018 01/29/2018  HbA1c <5.7 % of total Hgb - 5.9(H) - 6.0(H) 6.1(H)  Chol <200 mg/dL - 145 - 136 -  HDL > OR = 50 mg/dL - 50 - 50 -  Calc LDL mg/dL (calc) - 78 - 68 -  Triglycerides <150 mg/dL - 91 - 97 -  Creatinine 0.57 - 1.00 mg/dL 0.72 0.72 0.72 0.86 0.70   BP/Weight 08/24/2020 05/07/2020 05/04/2020 02/20/2020 01/02/2020 59/11/3110 03/12/2444  Systolic BP 950 722 575 051 833 582 518  Diastolic BP 90 81 91 83 81 78 84  Wt. (Lbs) 235 231 240 239 242.5 240 239.04  BMI 36.81 37.28 37.59 37.43 37.98 37.59 37.44  Some encounter information is confidential and restricted. Go to Review Flowsheets activity to see all data.   No flowsheet data found.  Updated lab needed at/ before next visit.

## 2020-08-25 NOTE — Assessment & Plan Note (Signed)
Not at goal, needs to lower salt intake and work on weight loss and exercisae, no med change DASH diet and commitment to daily physical activity for a minimum of 30 minutes discussed and encouraged, as a part of hypertension management. The importance of attaining a healthy weight is also discussed.  BP/Weight 08/24/2020 05/07/2020 05/04/2020 02/20/2020 01/02/2020 57/49/3552 03/13/4713  Systolic BP 953 967 289 791 504 136 438  Diastolic BP 90 81 91 83 81 78 84  Wt. (Lbs) 235 231 240 239 242.5 240 239.04  BMI 36.81 37.28 37.59 37.43 37.98 37.59 37.44  Some encounter information is confidential and restricted. Go to Review Flowsheets activity to see all data.

## 2020-08-25 NOTE — Assessment & Plan Note (Signed)
Updated lab needed at/ before next visit.   

## 2020-08-25 NOTE — Progress Notes (Signed)
Jenna Woods     MRN: [id number]      DOB: March 02, 1961   HPI Jenna Woods is here for follow up and re-evaluation of chronic medical conditions, medication management and review of any available recent lab and radiology data.  Preventive health is updated, specifically  Cancer screening and Immunization.   Questions or concerns regarding consultations or procedures which the PT has had in the interim are  addressed. The PT denies any adverse reactions to current medications since the last visit.  C/o increased stress and anxiety with new landlord, may have to relocate as a result of this Exercises for at least 150 min/ week, walking, following reduced sugar and carb diet also ROS Denies recent fever or chills. Denies sinus pressure, nasal congestion, ear pain or sore throat. Denies chest congestion, productive cough or wheezing. Denies chest pains, palpitations and leg swelling Denies abdominal pain, nausea, vomiting,diarrhea or constipation.   Denies dysuria, frequency, hesitancy or incontinence. Denies joint pain, swelling and limitation in mobility. Denies headaches, seizures, numbness, or tingling. Denies uncontrolleddepression, anxiety or insomnia. Denies skin break down or rash.   PE  BP 126/90 (BP Location: Right Arm, Patient Position: Sitting, Cuff Size: Large)   Pulse (!) 102   Temp 98.8 F (37.1 C) (Oral)   Resp 20   Ht 5\' 7"  (1.702 m)   Wt 235 lb (106.6 kg)   SpO2 95%   BMI 36.81 kg/m   Patient alert and oriented and in no cardiopulmonary distress.  HEENT: No facial asymmetry, EOMI,     Neck supple .  Chest: Clear to auscultation bilaterally.  CVS: S1, S2 no murmurs, no S3.Regular rate.  ABD: Soft non tender.   Ext: No edema  MS: Adequate ROM spine, shoulders, hips and knees.  Skin: Intact, no ulcerations or rash noted.  Psych: Good eye contact, normal affect. Memory intact not anxious or depressed appearing.  CNS: CN 2-12 intact, power,  normal  throughout.no focal deficits noted.   Assessment & Plan  Essential hypertension Not at goal, needs to lower salt intake and work on weight loss and exercisae, no med change DASH diet and commitment to daily physical activity for a minimum of 30 minutes discussed and encouraged, as a part of hypertension management. The importance of attaining a healthy weight is also discussed.  BP/Weight 08/24/2020 05/07/2020 05/04/2020 02/20/2020 01/02/2020 40/34/7425 11/09/6385  Systolic BP 564 332 951 884 166 063 016  Diastolic BP 90 81 91 83 81 78 84  Wt. (Lbs) 235 231 240 239 242.5 240 239.04  BMI 36.81 37.28 37.59 37.43 37.98 37.59 37.44  Some encounter information is confidential and restricted. Go to Review Flowsheets activity to see all data.       Morbid obesity ([place])  Patient re-educated about  the importance of commitment to a  minimum of 150 minutes of exercise per week as able.  The importance of healthy food choices with portion control discussed, as well as eating regularly and within a 12 hour window most days. The need to choose "clean , green" food 50 to 75% of the time is discussed, as well as to make water the primary drink and set a goal of 64 ounces water daily.    Weight /BMI 08/24/2020 05/07/2020 05/04/2020  WEIGHT 235 lb 231 lb 240 lb  HEIGHT 5\' 7"  5\' 6"  5\' 7"   BMI 36.81 kg/m2 37.28 kg/m2 37.59 kg/m2  Some encounter information is confidential and restricted. Go to Review Flowsheets activity to  see all data.      Major depression Controlled and managed by psych  Urinary incontinence Controlled, no change in medication   Vitamin D deficiency Updated lab needed at/ before next visit.   IGT (impaired glucose tolerance) Patient educated about the importance of limiting  Carbohydrate intake , the need to commit to daily physical activity for a minimum of 30 minutes , and to commit weight loss. The fact that changes in all these areas will reduce or eliminate all together  the development of diabetes is stressed.   Diabetic Labs Latest Ref Rng & Units 10/02/2019 04/01/2019 11/01/2018 09/03/2018 01/29/2018  HbA1c <5.7 % of total Hgb - 5.9(H) - 6.0(H) 6.1(H)  Chol <200 mg/dL - 145 - 136 -  HDL > OR = 50 mg/dL - 50 - 50 -  Calc LDL mg/dL (calc) - 78 - 68 -  Triglycerides <150 mg/dL - 91 - 97 -  Creatinine 0.57 - 1.00 mg/dL 0.72 0.72 0.72 0.86 0.70   BP/Weight 08/24/2020 05/07/2020 05/04/2020 02/20/2020 01/02/2020 08/56/9437 0/0/5259  Systolic BP 102 890 228 406 986 148 307  Diastolic BP 90 81 91 83 81 78 84  Wt. (Lbs) 235 231 240 239 242.5 240 239.04  BMI 36.81 37.28 37.59 37.43 37.98 37.59 37.44  Some encounter information is confidential and restricted. Go to Review Flowsheets activity to see all data.   No flowsheet data found.  Updated lab needed at/ before next visit.   GERD (gastroesophageal reflux disease) Controlled, no change in medication

## 2020-08-25 NOTE — Assessment & Plan Note (Signed)
Controlled and managed by psych 

## 2020-08-25 NOTE — Assessment & Plan Note (Signed)
  Patient re-educated about  the importance of commitment to a  minimum of 150 minutes of exercise per week as able.  The importance of healthy food choices with portion control discussed, as well as eating regularly and within a 12 hour window most days. The need to choose "clean , green" food 50 to 75% of the time is discussed, as well as to make water the primary drink and set a goal of 64 ounces water daily.    Weight /BMI 08/24/2020 05/07/2020 05/04/2020  WEIGHT 235 lb 231 lb 240 lb  HEIGHT 5\' 7"  5\' 6"  5\' 7"   BMI 36.81 kg/m2 37.28 kg/m2 37.59 kg/m2  Some encounter information is confidential and restricted. Go to Review Flowsheets activity to see all data.

## 2020-08-26 ENCOUNTER — Ambulatory Visit: Payer: Medicare Other

## 2020-08-26 ENCOUNTER — Ambulatory Visit: Payer: Self-pay

## 2020-08-26 ENCOUNTER — Other Ambulatory Visit (HOSPITAL_BASED_OUTPATIENT_CLINIC_OR_DEPARTMENT_OTHER): Payer: Self-pay

## 2020-08-31 ENCOUNTER — Telehealth (HOSPITAL_COMMUNITY): Payer: Self-pay | Admitting: *Deleted

## 2020-08-31 MED ORDER — ZOLPIDEM TARTRATE 10 MG PO TABS: ORAL_TABLET | ORAL | 0 refills | Status: DC

## 2020-08-31 MED ORDER — ALPRAZOLAM 1 MG PO TABS: 1.0000 mg | ORAL_TABLET | Freq: Three times a day (TID) | ORAL | 0 refills | Status: DC

## 2020-08-31 NOTE — Addendum Note (Signed)
Addended by: Merian Capron on: 08/31/2020 01:00 PM   Modules accepted: Orders

## 2020-08-31 NOTE — Telephone Encounter (Signed)
Patient called stating that her current pharmacy [facility] [address] is closing.   Staff called [facility] to verify and they did confirmed what patient stated.   Per pt she would like for provider to please resend her scripts to the pharmacy [facility].    Patient is requesting Xanax and Zolpidam

## 2020-09-01 ENCOUNTER — Other Ambulatory Visit: Payer: Self-pay

## 2020-09-01 ENCOUNTER — Ambulatory Visit (INDEPENDENT_AMBULATORY_CARE_PROVIDER_SITE_OTHER): Payer: Medicare Other | Admitting: Obstetrics & Gynecology

## 2020-09-01 ENCOUNTER — Encounter: Payer: Self-pay | Admitting: Obstetrics & Gynecology

## 2020-09-01 VITALS — BP 122/87 | HR 102 | Ht 66.0 in | Wt 235.0 lb

## 2020-09-01 DIAGNOSIS — Z4689 Encounter for fitting and adjustment of other specified devices: Secondary | ICD-10-CM | POA: Diagnosis not present

## 2020-09-01 DIAGNOSIS — N993 Prolapse of vaginal vault after hysterectomy: Secondary | ICD-10-CM

## 2020-09-01 NOTE — Progress Notes (Signed)
Chief Complaint  Patient presents with   Pessary Check    Blood pressure 122/87, pulse (!) 102, height 5\' 6"  (1.676 m), weight 235 lb (106.6 kg).  Jenna Woods presents today for routine follow up related to her pessary.   She uses a Milex ring with support #2 She reports no vaginal discharge and no vaginal bleeding   Likert scale(1 not bothersome -5 very bothersome)  :  1  Exam reveals no undue vaginal mucosal pressure of breakdown, no discharge and no vaginal bleeding.  Vaginal Epithelial Abnormality Classification System:   0 0    No abnormalities 1    Epithelial erythema 2    Granulation tissue 3    Epithelial break or erosion, 1 cm or less 4    Epithelial break or erosion, 1 cm or greater  The pessary is removed, cleaned and replaced without difficulty.      ICD-10-CM   1. Pessary maintenance, Milex ring with support #2, fitting 3/21  Z46.89     2. Vaginal vault prolapse after hysterectomy  J09.2        Jenna Woods will be sen back in 4 months for continued follow up.  Florian Buff, MD  09/01/2020 12:09 PM

## 2020-09-02 DIAGNOSIS — Z538 Procedure and treatment not carried out for other reasons: Secondary | ICD-10-CM | POA: Diagnosis not present

## 2020-09-02 DIAGNOSIS — K5730 Diverticulosis of large intestine without perforation or abscess without bleeding: Secondary | ICD-10-CM | POA: Diagnosis not present

## 2020-09-02 DIAGNOSIS — Z1211 Encounter for screening for malignant neoplasm of colon: Secondary | ICD-10-CM | POA: Diagnosis not present

## 2020-09-02 LAB — HM COLONOSCOPY

## 2020-10-01 ENCOUNTER — Telehealth (HOSPITAL_COMMUNITY): Payer: Self-pay | Admitting: *Deleted

## 2020-10-01 ENCOUNTER — Other Ambulatory Visit (HOSPITAL_COMMUNITY): Payer: Self-pay | Admitting: Psychiatry

## 2020-10-01 MED ORDER — ALPRAZOLAM 1 MG PO TABS: 1.0000 mg | ORAL_TABLET | Freq: Three times a day (TID) | ORAL | 2 refills | Status: DC

## 2020-10-01 NOTE — Telephone Encounter (Signed)
noted 

## 2020-10-01 NOTE — Telephone Encounter (Signed)
Patient called requesting refills for her Xanax to be sent to [facility]"

## 2020-10-01 NOTE — Telephone Encounter (Signed)
sent 

## 2020-10-02 ENCOUNTER — Other Ambulatory Visit (HOSPITAL_COMMUNITY): Payer: Self-pay | Admitting: Psychiatry

## 2020-10-02 ENCOUNTER — Telehealth (HOSPITAL_COMMUNITY): Payer: Self-pay | Admitting: *Deleted

## 2020-10-02 MED ORDER — ZOLPIDEM TARTRATE 10 MG PO TABS: ORAL_TABLET | ORAL | 2 refills | Status: DC

## 2020-10-02 NOTE — Telephone Encounter (Signed)
Patient pharmacy called for refills for patient Ambien

## 2020-10-02 NOTE — Telephone Encounter (Signed)
sent 

## 2020-12-07 ENCOUNTER — Other Ambulatory Visit: Payer: Self-pay

## 2020-12-07 ENCOUNTER — Telehealth (INDEPENDENT_AMBULATORY_CARE_PROVIDER_SITE_OTHER): Payer: Medicare Other | Admitting: Psychiatry

## 2020-12-07 ENCOUNTER — Encounter (HOSPITAL_COMMUNITY): Payer: Self-pay | Admitting: Psychiatry

## 2020-12-07 DIAGNOSIS — F411 Generalized anxiety disorder: Secondary | ICD-10-CM

## 2020-12-07 MED ORDER — ALPRAZOLAM 1 MG PO TABS: 1.0000 mg | ORAL_TABLET | Freq: Three times a day (TID) | ORAL | 3 refills | Status: DC

## 2020-12-07 MED ORDER — ZOLPIDEM TARTRATE 10 MG PO TABS: ORAL_TABLET | ORAL | 3 refills | Status: DC

## 2020-12-07 NOTE — Progress Notes (Signed)
Virtual Visit via Telephone Note  I connected with Jenna Woods on 60/63/01 at 11:00 AM EDT by telephone and verified that I am speaking with the correct person using two identifiers.  Location: Patient: home Provider: home office   I discussed the limitations, risks, security and privacy concerns of performing an evaluation and management service by telephone and the availability of in person appointments. I also discussed with the patient that there may be a patient responsible charge related to this service. The patient expressed understanding and agreed to proceed.      I discussed the assessment and treatment plan with the patient. The patient was provided an opportunity to ask questions and all were answered. The patient agreed with the plan and demonstrated an understanding of the instructions.   The patient was advised to call back or seek an in-person evaluation if the symptoms worsen or if the condition fails to improve as anticipated.  I provided 15 minutes of non-face-to-face time during this encounter.   Levonne Spiller, MD  [facility] MD/PA/NP OP Progress Note  60/03/930 35:57 AM Jenna Woods  MRN:  [id number]  Chief Complaint:  Chief Complaint   Anxiety; Follow-up    HPI: This patient is a 60 year old divorced black female who lives alone in [place].  She is on disability.  The patient returns after 4 months regarding her anxiety and difficulty sleeping.  The patient returns for follow-up after 4 months.  For the most part she is doing fairly well.  She is still having conflicts with her landlord.  Fortunately both of her sons have been more attentive.  She is still sad about loss of contact with her 61-year-old grandson.  Apparently his mother does not want anything to do with her son or his side of the family.  She states that the Xanax continues to help her anxiety and the Ambien continues to help with sleep.  She denies significant depression or suicidal  ideation Visit Diagnosis:    ICD-10-CM   1. Anxiety state  F41.1       Past Psychiatric History: none  Past Medical History:  Past Medical History:  Diagnosis Date   Allergic reaction    Allergy    perrenial    Anxiety    Arthritis    Depression    h/o suicidal ideation in 2011   Depression with anxiety    Diverticulosis of colon 12/19/2015   Diverticulosis of colon with hemorrhage 12/19/2015   GERD (gastroesophageal reflux disease)    Hypertension 1996   Obesity    Peripheral vascular disease ([place])    Prediabetes 2013   Raynauds syndrome    Scleroderma ([place]) 1998   Systemic lupus erythematosus ([place]) 1998   Treated at [facility]   Tobacco abuse, in remission    10-pack-years discontinued in 2007   Urinary frequency     Past Surgical History:  Procedure Laterality Date   ABDOMINAL HYSTERECTOMY  1990   Neoplasm   CESAREAN SECTION     x2   COLONOSCOPY  2005   COLONOSCOPY WITH PROPOFOL N/A 08/28/2015   Dr. Laural Golden: scattered medium-mouth diverticula entire colon, external hemorrhoids   COLONOSCOPY WITH PROPOFOL N/A 07/27/2016   Procedure: COLONOSCOPY WITH PROPOFOL;  Surgeon: Rogene Houston, MD;  [facility]: [facility];  Service: Endoscopy;  Laterality: N/A;   FINGER AMPUTATION     Third finger, bilateral, distal   INCISIONAL HERNIA REPAIR N/A 01/09/2019   Procedure: HERNIA REPAIR INCISIONAL;  Surgeon: Virl Cagey,  MD;  Location: [facility] ORS;  Service: General;  Laterality: N/A;   INSERTION OF MESH N/A 01/09/2019   Procedure: INSERTION OF MESH;  Surgeon: Virl Cagey, MD;  Location: [facility];  Service: General;  Laterality: N/A;   NASAL SINUS SURGERY  09/06/2011   Procedure: ENDOSCOPIC SINUS SURGERY;  Surgeon: Ascencion Dike, MD;  Location: [facility];  Service: ENT;  Laterality: N/A;  Nasal cerebrospinal fluid leak repair with Left temporalis fascia graft and Left Ear cartilage graft   VASCULAR SURGERY     Hands, bilaterally, [facility]; right hand  partial amputation of middle finger.    Family Psychiatric History: see below  Family History:  Family History  Problem Relation Age of Onset   Arthritis Mother        Rheumatoid   Dementia Mother    Hypertension Mother    Cirrhosis Father    Alcohol abuse Father    Arthritis Maternal Grandmother    Drug abuse Sister    Scleroderma Brother    Berenice Primas' disease Son    Colon cancer Neg Hx     Social History:  Social History   Socioeconomic History   Marital status: Single    Spouse name: Not on file   Number of children: 2   Years of education: 12   Highest education level: 12th grade  Occupational History   Occupation: Art therapist: [employer]    Comment: Disability awarded in 1998  Tobacco Use   Smoking status: Former    Packs/day: 0.25    Years: 25.00    Pack years: 6.25    Types: Cigarettes    Quit date: 04/07/2001    Years since quitting: 19.6   Smokeless tobacco: Never   Tobacco comments:    quit 88 days ago!!  Vaping Use   Vaping Use: Never used  Substance and Sexual Activity   Alcohol use: No    Alcohol/week: 0.0 standard drinks    Comment: quit in 2004   Drug use: No    Comment: use to use pot    Sexual activity: Not Currently    Birth control/protection: Surgical    Comment: hyst  Other Topics Concern   Not on file  Social History Narrative   Currently living with son in [place], moving back soon    Social Determinants of Health   Financial Resource Strain: Low Risk    Difficulty of Paying Living Expenses: Not hard at all  Food Insecurity: No Food Insecurity   Worried About Charity fundraiser in the Last Year: Never true   Arboriculturist in the Last Year: Never true  Transportation Needs: No Transportation Needs   Lack of Transportation (Medical): No   Lack of Transportation (Non-Medical): No  Physical Activity: Sufficiently Active   Days of Exercise per Week: 7 days   Minutes of Exercise per Session: 60 min   Stress: No Stress Concern Present   Feeling of Stress : Not at all  Social Connections: Moderately Isolated   Frequency of Communication with Friends and Family: More than three times a week   Frequency of Social Gatherings with Friends and Family: More than three times a week   Attends Religious Services: More than 4 times per year   Active Member of [employer] or Organizations: No   Attends Archivist Meetings: Never   Marital Status: Divorced    Allergies:  Allergies  Allergen Reactions  Bee Venom Anaphylaxis   Sesame Oil Anaphylaxis and Swelling    (Sesame seed)   Aspirin     Diverticulosis with bleed   Molds & Smuts    Zofran [Ondansetron] Itching    Generalized itching and had a rash on her thighs   Codeine Itching and Rash    Metabolic Disorder Labs: Lab Results  Component Value Date   HGBA1C 5.9 (H) 04/01/2019   MPG 123 04/01/2019   MPG 126 09/03/2018   No results found for: PROLACTIN Lab Results  Component Value Date   CHOL 145 04/01/2019   TRIG 91 04/01/2019   HDL 50 04/01/2019   CHOLHDL 2.9 04/01/2019   VLDL 15 06/30/2016   LDLCALC 78 04/01/2019   LDLCALC 68 09/03/2018   Lab Results  Component Value Date   TSH 1.71 04/01/2019   TSH 0.64 01/29/2018    Therapeutic Level Labs: No results found for: LITHIUM No results found for: VALPROATE No components found for:  CBMZ  Current Medications: Current Outpatient Medications  Medication Sig Dispense Refill   acetaminophen (TYLENOL 8 HOUR) 650 MG CR tablet Take 1 tablet (650 mg total) by mouth every 8 (eight) hours as needed for pain. 42 tablet 0   ALPRAZolam (XANAX) 1 MG tablet Take 1 tablet (1 mg total) by mouth 3 (three) times daily. 90 tablet 3   amLODipine (NORVASC) 10 MG tablet Take 1 tablet (10 mg total) by mouth daily. 90 tablet 3   Ascorbic Acid (VITAMIN C) 500 MG CAPS Take 500 mg by mouth daily.     Cholecalciferol (VITAMIN D3 PO) Take by mouth.     docusate sodium (COLACE) 100 MG  capsule Take 100 mg by mouth 2 (two) times daily.     ferrous sulfate 325 (65 FE) MG tablet Take 325 mg by mouth 1 day or 1 dose. 60 tablet 3   mirabegron ER (MYRBETRIQ) 25 MG TB24 tablet TAKE 1 TABLET(25 MG) BY MOUTH DAILY 30 tablet 5   pantoprazole (PROTONIX) 40 MG tablet TAKE 1 TABLET(40 MG) BY MOUTH DAILY 90 tablet 1   zolpidem (AMBIEN) 10 MG tablet TAKE 1 TABLET(10 MG) BY MOUTH AT BEDTIME AS NEEDED FOR SLEEP 30 tablet 3   No current facility-administered medications for this visit.     Musculoskeletal: Strength & Muscle Tone: na Gait & Station: na Patient leans: N/A  Psychiatric Specialty Exam: Review of Systems  All other systems reviewed and are negative.  There were no vitals taken for this visit.There is no height or weight on file to calculate BMI.  General Appearance: NA  Eye Contact:  NA  Speech:  Clear and Coherent  Volume:  Normal  Mood:  Euthymic  Affect:  NA  Thought Process:  Goal Directed  Orientation:  Full (Time, Place, and Person)  Thought Content: WDL   Suicidal Thoughts:  No  Homicidal Thoughts:  No  Memory:  Immediate;   Good Recent;   Good Remote;   Good  Judgement:  Good  Insight:  Good  Psychomotor Activity:  Normal  Concentration:  Concentration: Good and Attention Span: Good  Recall:  Good  Fund of Knowledge: Good  Language: Good  Akathisia:  No  Handed:  Right  AIMS (if indicated): not done  Assets:  Communication Skills Desire for Improvement Physical Health Resilience Social Support Talents/Skills  ADL's:  Intact  Cognition: WNL  Sleep:  Good   Screenings: GAD-7    Flowsheet Row Office Visit from 12/19/2019 in [facility]  Office Visit from 06/04/2019 in [facility] Phone Follow Up from 05/25/2017 in [facility]  Total GAD-7 Score 0 15 13      PHQ2-9    [facility] Office Visit from 08/24/2020 in [place] from 05/07/2020 in [facility]  [facility] Office Visit from 02/20/2020 in [facility] Office Visit from 12/19/2019 in [facility] Visit from 10/08/2019 in [facility]  PHQ-2 Total Score 0 0 3 0 1  PHQ-9 Total Score -- -- 3 -- --        Assessment and Plan: This patient is a 60 year old female with a history of anxiety and insomnia.  She is continue to do well on her current regimen.  She will continue Xanax 1 mg up to 3 times daily for anxiety as well as Ambien 10 mg at bedtime for sleep.  She will return to see me in 4  months   Levonne Spiller, MD 12/07/2020, 11:25 AM

## 2020-12-15 ENCOUNTER — Other Ambulatory Visit: Payer: Self-pay | Admitting: Family Medicine

## 2020-12-15 ENCOUNTER — Telehealth: Payer: Self-pay | Admitting: Family Medicine

## 2020-12-15 MED ORDER — TIZANIDINE HCL 4 MG PO TABS
4.0000 mg | ORAL_TABLET | Freq: Four times a day (QID) | ORAL | 0 refills | Status: DC | PRN
Start: 2020-12-15 — End: 2021-05-21

## 2020-12-15 NOTE — Telephone Encounter (Signed)
Pt states understanding of Zanaflex

## 2020-12-15 NOTE — Telephone Encounter (Signed)
Pt called in wanting to see if she can get a muscle relaxer for muscle spasms, has been exercising. Wants to speak with nurse, has tried everything to help with pain and spams.

## 2020-12-24 ENCOUNTER — Encounter: Payer: Self-pay | Admitting: Family Medicine

## 2020-12-24 ENCOUNTER — Other Ambulatory Visit: Payer: Self-pay

## 2020-12-24 ENCOUNTER — Ambulatory Visit (INDEPENDENT_AMBULATORY_CARE_PROVIDER_SITE_OTHER): Payer: Medicare Other | Admitting: Family Medicine

## 2020-12-24 ENCOUNTER — Ambulatory Visit (INDEPENDENT_AMBULATORY_CARE_PROVIDER_SITE_OTHER): Payer: Medicare Other | Admitting: Obstetrics & Gynecology

## 2020-12-24 ENCOUNTER — Encounter: Payer: Self-pay | Admitting: Obstetrics & Gynecology

## 2020-12-24 VITALS — BP 117/83 | HR 87 | Resp 18 | Ht 66.0 in | Wt 239.0 lb

## 2020-12-24 VITALS — BP 116/83 | HR 93 | Ht 66.0 in | Wt 239.4 lb

## 2020-12-24 DIAGNOSIS — Z13220 Encounter for screening for lipoid disorders: Secondary | ICD-10-CM | POA: Diagnosis not present

## 2020-12-24 DIAGNOSIS — Z0000 Encounter for general adult medical examination without abnormal findings: Secondary | ICD-10-CM

## 2020-12-24 DIAGNOSIS — K219 Gastro-esophageal reflux disease without esophagitis: Secondary | ICD-10-CM | POA: Diagnosis not present

## 2020-12-24 DIAGNOSIS — N993 Prolapse of vaginal vault after hysterectomy: Secondary | ICD-10-CM

## 2020-12-24 DIAGNOSIS — Z23 Encounter for immunization: Secondary | ICD-10-CM | POA: Diagnosis not present

## 2020-12-24 DIAGNOSIS — Z0001 Encounter for general adult medical examination with abnormal findings: Secondary | ICD-10-CM | POA: Diagnosis not present

## 2020-12-24 DIAGNOSIS — R7302 Impaired glucose tolerance (oral): Secondary | ICD-10-CM | POA: Diagnosis not present

## 2020-12-24 DIAGNOSIS — M359 Systemic involvement of connective tissue, unspecified: Secondary | ICD-10-CM | POA: Diagnosis not present

## 2020-12-24 DIAGNOSIS — E559 Vitamin D deficiency, unspecified: Secondary | ICD-10-CM | POA: Diagnosis not present

## 2020-12-24 DIAGNOSIS — Z466 Encounter for fitting and adjustment of urinary device: Secondary | ICD-10-CM

## 2020-12-24 DIAGNOSIS — Z4689 Encounter for fitting and adjustment of other specified devices: Secondary | ICD-10-CM

## 2020-12-24 DIAGNOSIS — I10 Essential (primary) hypertension: Secondary | ICD-10-CM | POA: Diagnosis not present

## 2020-12-24 NOTE — Progress Notes (Signed)
Zempic    Jenna Woods     MRN: [id number]      DOB: 07-24-1960  HPI: Patient is in for annual physical exam. Obesity is addressed  at the visit. Recent labs,  are reviewed. Immunization is reviewed , and  updated .   PE: BP 117/83   Pulse 87   Resp 18   Ht 5\' 6"  (1.676 m)   Wt 239 lb 0.6 oz (108.4 kg)   SpO2 96%   BMI 38.58 kg/m   Pleasant  female, alert and oriented x 3, in no cardio-pulmonary distress. Afebrile. HEENT No facial trauma or asymetry. Sinuses non tender.  Extra occullar muscles intact.. External ears normal, . Neck: supple, no adenopathy,JVD or thyromegaly.No bruits.  Chest: Clear to ascultation bilaterally.No crackles or wheezes. Non tender to palpation    Cardiovascular system; Heart sounds normal,  S1 and  S2 ,no S3.  No murmur, or thrill. Apical beat not displaced Peripheral pulses normal.  Abdomen: Soft, non tender, no organomegaly or masses. No bruits. Bowel sounds normal. No guarding, tenderness or rebound.     Musculoskeletal exam: Full ROM of spine, hips , shoulders and knees. No deformity ,swelling or crepitus noted. No muscle wasting or atrophy.   Neurologic: Cranial nerves 2 to 12 intact. Power, tone ,sensation and reflexes normal throughout. No disturbance in gait. No tremor.  Skin: Intact, no ulceration, erythema , scaling or rash noted. Pigmentation normal throughout  Psych; Normal mood and affect. Judgement and concentration normal   Assessment & Plan:   Annual physical exam Annual exam as documented. Counseling done  re healthy lifestyle involving commitment to 150 minutes exercise per week, heart healthy diet, and attaining healthy weight.The importance of adequate sleep also discussed. Regular seat belt use and home safety, is also discussed. Changes in health habits are decided on by the patient with goals and time frames  set for achieving them. Immunization and cancer screening needs are specifically  addressed at this visit.   Morbid obesity ([place])  Patient re-educated about  the importance of commitment to a  minimum of 150 minutes of exercise per week as able.  The importance of healthy food choices with portion control discussed, as well as eating regularly and within a 12 hour window most days. The need to choose "clean , green" food 50 to 75% of the time is discussed, as well as to make water the primary drink and set a goal of 64 ounces water daily.    Weight /BMI 12/24/2020 12/24/2020 09/01/2020  WEIGHT 239 lb 6.4 oz 239 lb 0.6 oz 235 lb  HEIGHT 5\' 6"  5\' 6"  5\' 6"   BMI 38.64 kg/m2 38.58 kg/m2 37.93 kg/m2  Some encounter information is confidential and restricted. Go to Review Flowsheets activity to see all data.    Start medication in Novemebr after her colonoscopy

## 2020-12-24 NOTE — Patient Instructions (Addendum)
F/U in 4  months, call if you need me sooner  Annual exam in 366 days  Pneumonia 20 today \ Fasting labs today already ordered in June  New to be started Nov 5 AFTER your colonoscopy to help with weight loss is saxenda  It is important that you exercise regularly at least 30 minutes 5 times a week. If you develop chest pain, have severe difficulty breathing, or feel very tired, stop exercising immediately and seek medical attention   Think about what you will eat, plan ahead. Choose " clean, green, fresh or frozen" over canned, processed or packaged foods which are more sugary, salty and fatty. 70 to 75% of food eaten should be vegetables and fruit. Three meals at set times with snacks allowed between meals, but they must be fruit or vegetables. Aim to eat over a 12 hour period , example 7 am to 7 pm, and STOP after  your last meal of the day. Drink water,generally about 64 ounces per day, no other drink is as healthy. Fruit juice is best enjoyed in a healthy way, by EATING the fruit.   Thanks for choosing [facility], we consider it a privelige to serve you.

## 2020-12-24 NOTE — Progress Notes (Signed)
     GYN VISIT Patient name: Jenna Woods MRN [id number]  Date of birth: 1961/03/03 Chief Complaint:    Chief Complaint  Patient presents with   Pessary Check   History of Present Illness:   Jenna Woods is a 60 y.o. [id number] PM, [place] female being seen today for pessary maintenance.   Blood pressure 116/83, pulse 93, height 5\' 6"  (1.676 m), weight 239 lb 6.4 oz (108.6 kg).  Jenna Woods presents today for routine follow up related to her pessary.   She uses a Milex ring with support #2 She reports no vaginal discharge and no vaginal bleeding   Likert scale(1 not bothersome -5 very bothersome)  :  1  Of note, she had one episode of urinary incontinence- full bladder leakage- prior to this it has not happened at all.  This happened when she was in [place] at the [employer] taking pics with her grandson- she thinks this was due to nerves.  She drinks 6-8 16oz waters/day, PCP has mentioned that he thinks she drinks too much water.  Typically get up 1-2x at night.     O: BP 116/83 (BP Location: Left Arm, Patient Position: Sitting, Cuff Size: Normal)   Pulse 93   Ht 5\' 6"  (1.676 m)   Wt 239 lb 6.4 oz (108.6 kg)   BMI 38.64 kg/m   General well developed, well nourished female, no acute distress Psych: mood and behavior appropriate Lungs: normal respiratory effort Vulva:  normal appearing vulva with no masses, tenderness or lesions Vagina:  normal mucosa, Exam reveals no undue vaginal mucosal pressure of breakdown, little discharge and no vaginal bleeding.  Vaginal Epithelial Abnormality Classification System:   0 0    No abnormalities 1    Epithelial erythema 2    Granulation tissue 3    Epithelial break or erosion, 1 cm or less 4    Epithelial break or erosion, 1 cm or greater  The pessary is removed, cleaned and replaced without difficulty.   Uterus and cervix surgically absent  Ext: minimal edema    ICD-10-CM   1. Pessary maintenance  Z46.89     2. Vaginal vault  prolapse after hysterectomy  T41.9        Jenna Woods will be sen back in 4 months for continued follow up.  Annalee Genta, DO  12/24/2020 9:51 AM

## 2020-12-25 LAB — CMP14+EGFR
ALT: 14 IU/L (ref 0–32)
AST: 15 IU/L (ref 0–40)
Albumin/Globulin Ratio: 2.9 — ABNORMAL HIGH (ref 1.2–2.2)
Albumin: 5.3 g/dL — ABNORMAL HIGH (ref 3.8–4.8)
Alkaline Phosphatase: 84 IU/L (ref 44–121)
BUN/Creatinine Ratio: 22 (ref 12–28)
BUN: 15 mg/dL (ref 8–27)
Bilirubin Total: 0.2 mg/dL (ref 0.0–1.2)
CO2: 24 mmol/L (ref 20–29)
Calcium: 9.9 mg/dL (ref 8.7–10.3)
Chloride: 103 mmol/L (ref 96–106)
Creatinine, Ser: 0.69 mg/dL (ref 0.57–1.00)
Globulin, Total: 1.8 g/dL (ref 1.5–4.5)
Glucose: 85 mg/dL (ref 70–99)
Potassium: 4.6 mmol/L (ref 3.5–5.2)
Sodium: 139 mmol/L (ref 134–144)
Total Protein: 7.1 g/dL (ref 6.0–8.5)
eGFR: 98 mL/min/{1.73_m2} (ref >59)

## 2020-12-25 LAB — CBC WITH DIFFERENTIAL/PLATELET
Basophils Absolute: 0.1 10*3/uL (ref 0.0–0.2)
Basos: 1 %
EOS (ABSOLUTE): 0.3 10*3/uL (ref 0.0–0.4)
Eos: 3 %
Hematocrit: 41.6 % (ref 34.0–46.6)
Hemoglobin: 14.1 g/dL (ref 11.1–15.9)
Immature Grans (Abs): 0 10*3/uL (ref 0.0–0.1)
Immature Granulocytes: 0 %
Lymphocytes Absolute: 1.5 10*3/uL (ref 0.7–3.1)
Lymphs: 15 %
MCH: 31.2 pg (ref 26.6–33.0)
MCHC: 33.9 g/dL (ref 31.5–35.7)
MCV: 92 fL (ref 79–97)
Monocytes Absolute: 0.6 10*3/uL (ref 0.1–0.9)
Monocytes: 6 %
Neutrophils Absolute: 7.6 10*3/uL — ABNORMAL HIGH (ref 1.4–7.0)
Neutrophils: 75 %
Platelets: 382 10*3/uL (ref 150–450)
RBC: 4.52 x10E6/uL (ref 3.77–5.28)
RDW: 11.8 % (ref 11.7–15.4)
WBC: 10.1 10*3/uL (ref 3.4–10.8)

## 2020-12-25 LAB — TSH: TSH: 0.906 u[IU]/mL (ref 0.450–4.500)

## 2020-12-25 LAB — LIPID PANEL
Chol/HDL Ratio: 2.7 ratio (ref 0.0–4.4)
Cholesterol, Total: 148 mg/dL (ref 100–199)
HDL: 55 mg/dL (ref >39)
LDL Chol Calc (NIH): 77 mg/dL (ref 0–99)
Triglycerides: 81 mg/dL (ref 0–149)
VLDL Cholesterol Cal: 16 mg/dL (ref 5–40)

## 2020-12-25 LAB — VITAMIN D 25 HYDROXY (VIT D DEFICIENCY, FRACTURES): Vit D, 25-Hydroxy: 36.4 ng/mL (ref 30.0–100.0)

## 2020-12-25 LAB — HEMOGLOBIN A1C
Est. average glucose Bld gHb Est-mCnc: 120 mg/dL
Hgb A1c MFr Bld: 5.8 % — ABNORMAL HIGH (ref 4.8–5.6)

## 2020-12-26 DIAGNOSIS — Z0000 Encounter for general adult medical examination without abnormal findings: Secondary | ICD-10-CM | POA: Insufficient documentation

## 2020-12-26 DIAGNOSIS — Z0001 Encounter for general adult medical examination with abnormal findings: Secondary | ICD-10-CM | POA: Insufficient documentation

## 2020-12-26 MED ORDER — OZEMPIC (0.25 OR 0.5 MG/DOSE) 2 MG/1.5ML ~~LOC~~ SOPN: 0.5000 mg | PEN_INJECTOR | SUBCUTANEOUS | 1 refills | Status: AC

## 2020-12-26 MED ORDER — OZEMPIC (0.25 OR 0.5 MG/DOSE) 2 MG/1.5ML ~~LOC~~ SOPN: 0.2500 mg | PEN_INJECTOR | SUBCUTANEOUS | 0 refills | Status: AC

## 2020-12-26 NOTE — Assessment & Plan Note (Signed)
  Patient re-educated about  the importance of commitment to a  minimum of 150 minutes of exercise per week as able.  The importance of healthy food choices with portion control discussed, as well as eating regularly and within a 12 hour window most days. The need to choose "clean , green" food 50 to 75% of the time is discussed, as well as to make water the primary drink and set a goal of 64 ounces water daily.    Weight /BMI 12/24/2020 12/24/2020 09/01/2020  WEIGHT 239 lb 6.4 oz 239 lb 0.6 oz 235 lb  HEIGHT 5\' 6"  5\' 6"  5\' 6"   BMI 38.64 kg/m2 38.58 kg/m2 37.93 kg/m2  Some encounter information is confidential and restricted. Go to Review Flowsheets activity to see all data.    Start medication in Novemebr after her colonoscopy

## 2020-12-26 NOTE — Assessment & Plan Note (Signed)

## 2021-01-01 ENCOUNTER — Ambulatory Visit: Payer: Medicare Other | Admitting: Obstetrics & Gynecology

## 2021-01-08 DIAGNOSIS — K5730 Diverticulosis of large intestine without perforation or abscess without bleeding: Secondary | ICD-10-CM | POA: Diagnosis not present

## 2021-01-08 DIAGNOSIS — Z1211 Encounter for screening for malignant neoplasm of colon: Secondary | ICD-10-CM | POA: Diagnosis not present

## 2021-01-08 DIAGNOSIS — D120 Benign neoplasm of cecum: Secondary | ICD-10-CM | POA: Diagnosis not present

## 2021-01-12 DIAGNOSIS — D120 Benign neoplasm of cecum: Secondary | ICD-10-CM | POA: Diagnosis not present

## 2021-01-29 ENCOUNTER — Other Ambulatory Visit: Payer: Self-pay

## 2021-01-29 DIAGNOSIS — M329 Systemic lupus erythematosus, unspecified: Secondary | ICD-10-CM

## 2021-01-29 MED ORDER — MIRABEGRON ER 25 MG PO TB24: ORAL_TABLET | ORAL | 5 refills | Status: DC

## 2021-02-25 ENCOUNTER — Telehealth: Payer: Self-pay | Admitting: Family Medicine

## 2021-02-25 ENCOUNTER — Other Ambulatory Visit: Payer: Self-pay

## 2021-02-25 DIAGNOSIS — K219 Gastro-esophageal reflux disease without esophagitis: Secondary | ICD-10-CM

## 2021-02-25 MED ORDER — PANTOPRAZOLE SODIUM 40 MG PO TBEC: DELAYED_RELEASE_TABLET | ORAL | 1 refills | Status: DC

## 2021-02-25 NOTE — Telephone Encounter (Signed)
Refilled

## 2021-02-25 NOTE — Telephone Encounter (Signed)
Pt called in for refill on   pantoprazole (PROTONIX) 40 MG tablet

## 2021-03-12 ENCOUNTER — Other Ambulatory Visit: Payer: Self-pay | Admitting: Family Medicine

## 2021-03-12 DIAGNOSIS — Z1231 Encounter for screening mammogram for malignant neoplasm of breast: Secondary | ICD-10-CM

## 2021-03-29 ENCOUNTER — Telehealth: Payer: Self-pay | Admitting: Family Medicine

## 2021-03-29 NOTE — Telephone Encounter (Signed)
Please call the pt regarding when she seen the eye dr last time

## 2021-03-30 NOTE — Telephone Encounter (Signed)
Pt states she has an appt with Dr Katy Fitch in [facility] on May 3 @ 9:45am.

## 2021-04-06 ENCOUNTER — Other Ambulatory Visit: Payer: Self-pay

## 2021-04-06 ENCOUNTER — Encounter (HOSPITAL_COMMUNITY): Payer: Self-pay | Admitting: Psychiatry

## 2021-04-06 ENCOUNTER — Telehealth (INDEPENDENT_AMBULATORY_CARE_PROVIDER_SITE_OTHER): Payer: Medicare Other | Admitting: Psychiatry

## 2021-04-06 DIAGNOSIS — F411 Generalized anxiety disorder: Secondary | ICD-10-CM

## 2021-04-06 MED ORDER — ALPRAZOLAM 1 MG PO TABS: 1.0000 mg | ORAL_TABLET | Freq: Three times a day (TID) | ORAL | 3 refills | Status: DC

## 2021-04-06 MED ORDER — ZOLPIDEM TARTRATE 10 MG PO TABS: ORAL_TABLET | ORAL | 3 refills | Status: DC

## 2021-04-06 NOTE — Progress Notes (Signed)
Virtual Visit via Telephone Note  I connected with Jenna Woods on 50/[birth year] at 11:00 AM EST by telephone and verified that I am speaking with the correct person using two identifiers.  Location: Patient: home Provider: office   I discussed the limitations, risks, security and privacy concerns of performing an evaluation and management service by telephone and the availability of in person appointments. I also discussed with the patient that there may be a patient responsible charge related to this service. The patient expressed understanding and agreed to proceed.     I discussed the assessment and treatment plan with the patient. The patient was provided an opportunity to ask questions and all were answered. The patient agreed with the plan and demonstrated an understanding of the instructions.   The patient was advised to call back or seek an in-person evaluation if the symptoms worsen or if the condition fails to improve as anticipated.  I provided 12 minutes of non-face-to-face time during this encounter.   Jenna Spiller, MD  [facility] MD/PA/NP OP Progress Note  5/68/1275 17:00 AM Jenna Woods  MRN:  [id number]  Chief Complaint:  Chief Complaint   Anxiety; Follow-up    HPI: This patient is a 61 year old divorced black female who lives alone in [place].  She is on disability.  The patient returns after 4 months regarding her anxiety and difficulty sleeping.  The patient returns for follow-up after 4 months regarding her anxiety.  She states she is not sleeping as well but it works fairly well if she breaks the Ambien in half and takes half at bedtime and half if she wakes up.  Her sons have not been paying her much attention and she did not see them over the holidays.  She states 1 is angry because she urged him to pay more child support for his 57-year-old son.  The other is angry because she would help finance and he went in for his car.  She states that the Xanax continues  to help her anxiety.  She denies significant depression and gets a lot of support from her church Visit Diagnosis:    ICD-10-CM   1. Anxiety state  F41.1       Past Psychiatric History: none  Past Medical History:  Past Medical History:  Diagnosis Date   Allergic reaction    Allergy    perrenial    Anxiety    Arthritis    Depression    h/o suicidal ideation in 2011   Depression with anxiety    Diverticulosis of colon 12/19/2015   Diverticulosis of colon with hemorrhage 12/19/2015   GERD (gastroesophageal reflux disease)    Hypertension 1996   Obesity    Peripheral vascular disease ([place])    Prediabetes 2013   Raynauds syndrome    Scleroderma ([place]) 1998   Systemic lupus erythematosus ([place]) 1998   Treated at [facility]   Tobacco abuse, in remission    10-pack-years discontinued in 2007   Urinary frequency     Past Surgical History:  Procedure Laterality Date   ABDOMINAL HYSTERECTOMY  1990   Neoplasm   CESAREAN SECTION     x2   COLONOSCOPY  2005   COLONOSCOPY WITH PROPOFOL N/A 08/28/2015   Dr. Laural Golden: scattered medium-mouth diverticula entire colon, external hemorrhoids   COLONOSCOPY WITH PROPOFOL N/A 07/27/2016   Procedure: COLONOSCOPY WITH PROPOFOL;  Surgeon: Rogene Houston, MD;  [facility]: [facility];  Service: Endoscopy;  Laterality: N/A;   FINGER AMPUTATION  Third finger, bilateral, distal   INCISIONAL HERNIA REPAIR N/A 01/09/2019   Procedure: HERNIA REPAIR INCISIONAL;  Surgeon: Virl Cagey, MD;  Location: [facility] ORS;  Service: General;  Laterality: N/A;   INSERTION OF MESH N/A 01/09/2019   Procedure: INSERTION OF MESH;  Surgeon: Virl Cagey, MD;  Location: [facility];  Service: General;  Laterality: N/A;   NASAL SINUS SURGERY  09/06/2011   Procedure: ENDOSCOPIC SINUS SURGERY;  Surgeon: Ascencion Dike, MD;  Location: [place];  Service: ENT;  Laterality: N/A;  Nasal cerebrospinal fluid leak repair with Left temporalis fascia graft and Left Ear  cartilage graft   VASCULAR SURGERY     Hands, bilaterally, [facility]; right hand partial amputation of middle finger.    Family Psychiatric History: see below  Family History:  Family History  Problem Relation Age of Onset   Arthritis Mother        Rheumatoid   Dementia Mother    Hypertension Mother    Cirrhosis Father    Alcohol abuse Father    Arthritis Maternal Grandmother    Drug abuse Sister    Scleroderma Brother    Berenice Primas' disease Son    Colon cancer Neg Hx     Social History:  Social History   Socioeconomic History   Marital status: Single    Spouse name: Not on file   Number of children: 2   Years of education: 12   Highest education level: 12th grade  Occupational History   Occupation: Art therapist: [employer]    Comment: Disability awarded in 1998  Tobacco Use   Smoking status: Former    Packs/day: 0.25    Years: 25.00    Pack years: 6.25    Types: Cigarettes    Quit date: 04/07/2001    Years since quitting: 20.0   Smokeless tobacco: Never   Tobacco comments:    quit 88 days ago!!  Vaping Use   Vaping Use: Never used  Substance and Sexual Activity   Alcohol use: No    Alcohol/week: 0.0 standard drinks    Comment: quit in 2004   Drug use: No    Comment: use to use pot    Sexual activity: Not Currently    Birth control/protection: Surgical    Comment: hyst  Other Topics Concern   Not on file  Social History Narrative   Currently living with son in [place], moving back soon    Social Determinants of Health   Financial Resource Strain: Low Risk    Difficulty of Paying Living Expenses: Not hard at all  Food Insecurity: No Food Insecurity   Worried About Charity fundraiser in the Last Year: Never true   Arboriculturist in the Last Year: Never true  Transportation Needs: No Transportation Needs   Lack of Transportation (Medical): No   Lack of Transportation (Non-Medical): No  Physical Activity: Sufficiently  Active   Days of Exercise per Week: 7 days   Minutes of Exercise per Session: 60 min  Stress: No Stress Concern Present   Feeling of Stress : Not at all  Social Connections: Moderately Isolated   Frequency of Communication with Friends and Family: More than three times a week   Frequency of Social Gatherings with Friends and Family: More than three times a week   Attends Religious Services: More than 4 times per year   Active Member of Clubs or Organizations: No  Attends Archivist Meetings: Never   Marital Status: Divorced    Allergies:  Allergies  Allergen Reactions   Bee Venom Anaphylaxis   Sesame Oil Anaphylaxis and Swelling    (Sesame seed)   Aspirin     Diverticulosis with bleed   Molds & Smuts    Zofran [Ondansetron] Itching    Generalized itching and had a rash on her thighs   Codeine Itching and Rash    Metabolic Disorder Labs: Lab Results  Component Value Date   HGBA1C 5.8 (H) 12/24/2020   MPG 123 04/01/2019   MPG 126 09/03/2018   No results found for: PROLACTIN Lab Results  Component Value Date   CHOL 148 12/24/2020   TRIG 81 12/24/2020   HDL 55 12/24/2020   CHOLHDL 2.7 12/24/2020   VLDL 15 06/30/2016   LDLCALC 77 12/24/2020   LDLCALC 78 04/01/2019   Lab Results  Component Value Date   TSH 0.906 12/24/2020   TSH 1.71 04/01/2019    Therapeutic Level Labs: No results found for: LITHIUM No results found for: VALPROATE No components found for:  CBMZ  Current Medications: Current Outpatient Medications  Medication Sig Dispense Refill   acetaminophen (TYLENOL 8 HOUR) 650 MG CR tablet Take 1 tablet (650 mg total) by mouth every 8 (eight) hours as needed for pain. 42 tablet 0   ALPRAZolam (XANAX) 1 MG tablet Take 1 tablet (1 mg total) by mouth 3 (three) times daily. 90 tablet 3   amLODipine (NORVASC) 10 MG tablet Take 1 tablet (10 mg total) by mouth daily. 90 tablet 3   Ascorbic Acid (VITAMIN C) 500 MG CAPS Take 500 mg by mouth daily.      Cholecalciferol (VITAMIN D3 PO) Take by mouth.     docusate sodium (COLACE) 100 MG capsule Take 100 mg by mouth 2 (two) times daily. (Patient not taking: Reported on 12/24/2020)     ferrous sulfate 325 (65 FE) MG tablet Take 325 mg by mouth 1 day or 1 dose. 60 tablet 3   mirabegron ER (MYRBETRIQ) 25 MG TB24 tablet TAKE 1 TABLET(25 MG) BY MOUTH DAILY 30 tablet 5   pantoprazole (PROTONIX) 40 MG tablet TAKE 1 TABLET(40 MG) BY MOUTH DAILY 90 tablet 1   tiZANidine (ZANAFLEX) 4 MG tablet Take 1 tablet (4 mg total) by mouth every 6 (six) hours as needed for muscle spasms. 30 tablet 0   zolpidem (AMBIEN) 10 MG tablet TAKE 1 TABLET(10 MG) BY MOUTH AT BEDTIME AS NEEDED FOR SLEEP 30 tablet 3   No current facility-administered medications for this visit.     Musculoskeletal: Strength & Muscle Tone: na Gait & Station: na Patient leans: N/A  Psychiatric Specialty Exam: Review of Systems  Musculoskeletal:  Positive for arthralgias and myalgias.  All other systems reviewed and are negative.  There were no vitals taken for this visit.There is no height or weight on file to calculate BMI.  General Appearance: NA  Eye Contact:  NA  Speech:  Clear and Coherent  Volume:  Normal  Mood:  Euthymic  Affect:  NA  Thought Process:  Goal Directed  Orientation:  Full (Time, Place, and Person)  Thought Content: Rumination   Suicidal Thoughts:  No  Homicidal Thoughts:  No  Memory:  Immediate;   Good Recent;   Good Remote;   Good  Judgement:  Good  Insight:  Fair  Psychomotor Activity:  Normal  Concentration:  Concentration: Good and Attention Span: Good  Recall:  Good  Fund of Knowledge: Good  Language: Good  Akathisia:  No  Handed:  Right  AIMS (if indicated): not done  Assets:  Communication Skills Desire for Improvement Resilience Social Support Talents/Skills  ADL's:  Intact  Cognition: WNL  Sleep:  Fair   Screenings: GAD-7    Flowsheet Row Office Visit from 12/19/2019 in [place]  Primary Care Office Visit from 06/04/2019 in [facility] Phone Follow Up from 05/25/2017 in [place]  Total GAD-7 Score 0 15 13      PHQ2-9    [place] Office Visit from 12/24/2020 in [facility] Office Visit from 08/24/2020 in [place] from 05/07/2020 in [facility] Office Visit from 02/20/2020 in [facility] Office Visit from 12/19/2019 in [facility]  PHQ-2 Total Score 0 0 0 3 0  PHQ-9 Total Score 0 -- -- 3 --        Assessment and Plan: This patient is a 61 year old female with a history of anxiety and insomnia.  For the most part she is doing well on her current regimen.  She will continue Xanax 1 mg 3 times daily for anxiety as well as Ambien 10 mg at bedtime for sleep.  She can break up the Ambien and to 5 mg dosages as needed.  She will return to see me in 4 months   Jenna Spiller, MD 04/06/2021, 11:14 AM

## 2021-04-26 ENCOUNTER — Ambulatory Visit
Admission: RE | Admit: 2021-04-26 | Discharge: 2021-04-26 | Disposition: A | Discharge status: Home / Self Care | Payer: Medicare Other | Source: Ambulatory Visit | Attending: Family Medicine | Admitting: Family Medicine

## 2021-04-26 DIAGNOSIS — Z1231 Encounter for screening mammogram for malignant neoplasm of breast: Secondary | ICD-10-CM

## 2021-05-10 ENCOUNTER — Ambulatory Visit (INDEPENDENT_AMBULATORY_CARE_PROVIDER_SITE_OTHER): Payer: Medicare Other | Admitting: Family Medicine

## 2021-05-10 ENCOUNTER — Ambulatory Visit: Payer: Medicare Other | Admitting: Obstetrics & Gynecology

## 2021-05-10 ENCOUNTER — Other Ambulatory Visit: Payer: Self-pay

## 2021-05-10 DIAGNOSIS — Z0000 Encounter for general adult medical examination without abnormal findings: Secondary | ICD-10-CM | POA: Diagnosis not present

## 2021-05-10 NOTE — Progress Notes (Signed)
Subjective:   Jenna Woods is a 61 y.o. female who presents for Medicare Annual (Subsequent) preventive examination.  Review of Systems    I connected with  KHALILA BUECHNER on [birth year] by a audio enabled telemedicine application and verified that I am speaking with the correct person using two identifiers.  Patient Location: Home  Provider Location: Office/Clinic  I discussed the limitations of evaluation and management by telemedicine. The patient expressed understanding and agreed to proceed.  Cardiac Risk Factors include: advanced age (>1mn, >>44women);obesity (BMI >30kg/m2);hypertension     Objective:    There were no vitals filed for this visit. There is no height or weight on file to calculate BMI.  Advanced Directives 05/10/2021 05/07/2020 01/04/2019 04/23/2018 04/17/2017 03/30/2017 07/27/2016  Does Patient Have a Medical Advance Directive? Yes Yes No No Yes No Yes  Type of Advance Directive Living will;Healthcare Power of AWilderLiving will - - - - -  Does patient want to make changes to medical advance directive? No - Patient declined - - - No - Patient declined - -  Copy of [place] Chart? No - copy requested No - copy requested - - - - -  Would patient like information on creating a medical advance directive? - - Yes (MAU/Ambulatory/Procedural Areas - Information given) Yes (MAU/Ambulatory/Procedural Areas - Information given) - No - Patient declined -  Pre-existing out of facility DNR order (yellow form or pink MOST form) - - - - - - -    Current Medications (verified) Outpatient Encounter Medications as of 05/10/2021  Medication Sig   acetaminophen (TYLENOL 8 HOUR) 650 MG CR tablet Take 1 tablet (650 mg total) by mouth every 8 (eight) hours as needed for pain.   ALPRAZolam (XANAX) 1 MG tablet Take 1 tablet (1 mg total) by mouth 3 (three) times daily.   amLODipine (NORVASC) 10 MG tablet Take 1 tablet (10 mg total)  by mouth daily.   Ascorbic Acid (VITAMIN C) 500 MG CAPS Take 500 mg by mouth daily.   Cholecalciferol (VITAMIN D3 PO) Take by mouth.   docusate sodium (COLACE) 100 MG capsule Take 100 mg by mouth 2 (two) times daily. (Patient not taking: Reported on 12/24/2020)   ferrous sulfate 325 (65 FE) MG tablet Take 325 mg by mouth 1 day or 1 dose.   mirabegron ER (MYRBETRIQ) 25 MG TB24 tablet TAKE 1 TABLET(25 MG) BY MOUTH DAILY   pantoprazole (PROTONIX) 40 MG tablet TAKE 1 TABLET(40 MG) BY MOUTH DAILY   tiZANidine (ZANAFLEX) 4 MG tablet Take 1 tablet (4 mg total) by mouth every 6 (six) hours as needed for muscle spasms.   zolpidem (AMBIEN) 10 MG tablet TAKE 1 TABLET(10 MG) BY MOUTH AT BEDTIME AS NEEDED FOR SLEEP   No facility-administered encounter medications on file as of 05/10/2021.    Allergies (verified) Bee venom, Sesame oil, Aspirin, Molds & smuts, Zofran [ondansetron], and Codeine   History: Past Medical History:  Diagnosis Date   Allergic reaction    Allergy    perrenial    Anxiety    Arthritis    Depression    h/o suicidal ideation in 2011   Depression with anxiety    Diverticulosis of colon 12/19/2015   Diverticulosis of colon with hemorrhage 12/19/2015   GERD (gastroesophageal reflux disease)    Hypertension 1996   Obesity    [place]    Prediabetes 2013   Raynauds syndrome  Scleroderma ([place]) 1998   Systemic lupus erythematosus ([place]) 1998   Treated at [facility], in remission    10-pack-years discontinued in 2007   Urinary frequency    Past Surgical History:  Procedure Laterality Date   ABDOMINAL HYSTERECTOMY  1990   Neoplasm   CESAREAN SECTION     x2   COLONOSCOPY  2005   COLONOSCOPY WITH PROPOFOL N/A 08/28/2015   Dr. Laural Golden: scattered medium-mouth diverticula entire colon, external hemorrhoids   COLONOSCOPY WITH PROPOFOL N/A 07/27/2016   Procedure: COLONOSCOPY WITH PROPOFOL;  Surgeon: Rogene Houston, MD;  Location[facility];  Service: Endoscopy;  Laterality: N/A;   FINGER AMPUTATION     Third finger, bilateral, distal   INCISIONAL HERNIA REPAIR N/A 01/09/2019   Procedure: HERNIA REPAIR INCISIONAL;  Surgeon: Virl Cagey, MD;  Location: [facility];  Service: General;  Laterality: N/A;   INSERTION OF MESH N/A 01/09/2019   Procedure: INSERTION OF MESH;  Surgeon: Virl Cagey, MD;  Location: [facility];  Service: General;  Laterality: N/A;   NASAL SINUS SURGERY  09/06/2011   Procedure: ENDOSCOPIC SINUS SURGERY;  Surgeon: Ascencion Dike, MD;  Location: [facility];  Service: ENT;  Laterality: N/A;  Nasal cerebrospinal fluid leak repair with Left temporalis fascia graft and Left Ear cartilage graft   VASCULAR SURGERY     Hands, bilaterally, [facility]; right hand partial amputation of middle finger.   Family History  Problem Relation Age of Onset   Arthritis Mother        Rheumatoid   Dementia Mother    Hypertension Mother    Cirrhosis Father    Alcohol abuse Father    Arthritis Maternal Grandmother    Drug abuse Sister    Scleroderma Brother    Berenice Primas' disease Son    Colon cancer Neg Hx    Social History   Socioeconomic History   Marital status: Single    Spouse name: Not on file   Number of children: 2   Years of education: 12   Highest education level: 12th grade  Occupational History   Occupation: Art therapist: [employer]    Comment: Disability awarded in 1998  Tobacco Use   Smoking status: Former    Packs/day: 0.25    Years: 25.00    Pack years: 6.25    Types: Cigarettes    Quit date: 04/07/2001    Years since quitting: 20.1   Smokeless tobacco: Never   Tobacco comments:    quit 88 days ago!!  Vaping Use   Vaping Use: Never used  Substance and Sexual Activity   Alcohol use: No    Alcohol/week: 0.0 standard drinks    Comment: quit in 2004   Drug use: No    Comment: use to use pot    Sexual activity: Not Currently    Birth  control/protection: Surgical    Comment: hyst  Other Topics Concern   Not on file  Social History Narrative   Currently living with son in [place], moving back soon    Social Determinants of Health   Financial Resource Strain: Low Risk    Difficulty of Paying Living Expenses: Not very hard  Food Insecurity: No Food Insecurity   Worried About Charity fundraiser in the Last Year: Never true   [place] in the Last Year: Never true  Transportation Needs: No Transportation Needs   Lack of  Transportation (Medical): No   Lack of Transportation (Non-Medical): No  Physical Activity: Sufficiently Active   Days of Exercise per Week: 4 days   Minutes of Exercise per Session: 60 min  Stress: No Stress Concern Present   Feeling of Stress : Not at all  Social Connections: Moderately Integrated   Frequency of Communication with Friends and Family: More than three times a week   Frequency of Social Gatherings with Friends and Family: Three times a week   Attends Religious Services: More than 4 times per year   Active Member of Clubs or Organizations: Yes   Attends Archivist Meetings: 1 to 4 times per year   Marital Status: Divorced    Tobacco Counseling Counseling given: Not Answered Tobacco comments: quit 88 days ago!!   Clinical Intake:  Pre-visit preparation completed: No  Pain : No/denies pain     Nutritional Status: BMI > 30  Obese Nutritional Risks: None Diabetes: No     Diabetic?no  Interpreter Needed?: No      Activities of Daily Living In your present state of health, do you have any difficulty performing the following activities: 05/10/2021  Hearing? N  Vision? N  Difficulty concentrating or making decisions? Y  Comment sometimes  Walking or climbing stairs? Y  Comment sometimes  Dressing or bathing? N  Doing errands, shopping? N  Preparing Food and eating ? N  Using the Toilet? N  In the past six months, have you accidently leaked urine?  Y  Do you have problems with loss of bowel control? N  Managing your Medications? N  Managing your Finances? N  Housekeeping or managing your Housekeeping? N  Some recent data might be hidden    Patient Care Team: Fayrene Helper, MD as PCP - General Harrington Challenger Su Ley, MD as Consulting Physician [facility])  Indicate any recent Medical Services you may have received from other than [facility] providers in the past year (date may be approximate).     Assessment:   This is a routine wellness examination for [place].  Hearing/Vision screen No results found.  Dietary issues and exercise activities discussed: Current Exercise Habits: Home exercise routine, Type of exercise: walking, Time (Minutes): 60, Frequency (Times/Week): 4, Weekly Exercise (Minutes/Week): 240, Intensity: Mild, Exercise limited by: None identified   Goals Addressed             This Visit's Progress    Increase physical activity   On track    Patient would like to increase her exercise to 5 days a week.      Quit smoking / using tobacco   On track      Depression Screen PHQ 2/9 Scores 05/10/2021 05/10/2021 12/24/2020 08/24/2020 05/07/2020 02/20/2020 12/19/2019  PHQ - 2 Score 2 2 0 0 0 3 0  PHQ- 9 Score 2 2 0 - - 3 -    Fall Risk Fall Risk  05/10/2021 12/24/2020 08/24/2020 08/24/2020 02/20/2020  Falls in the past year? 0 0 0 0 0  Number falls in past yr: 0 - 0 0 0  Injury with Fall? 0 - - 0 0  Risk for fall due to : No Fall Risks - - No Fall Risks -  Follow up Falls prevention discussed - - Falls evaluation completed -    FALL RISK PREVENTION PERTAINING TO THE HOME:  Any stairs in or around the home? No  If so, are there any without handrails? No  Home free of loose throw rugs  in walkways, pet beds, electrical cords, etc? Yes  Adequate lighting in your home to reduce risk of falls? Yes   ASSISTIVE DEVICES UTILIZED TO PREVENT FALLS:  Life alert? No  Use of a cane, walker or w/c? No  Grab bars in the  bathroom? Yes  Shower chair or bench in shower? No  Elevated toilet seat or a handicapped toilet? No     Cognitive Function:     6CIT Screen 05/10/2021 05/07/2019 04/23/2018 04/17/2017 04/18/2016  What Year? 0 points 0 points 0 points 0 points 0 points  What month? 0 points 0 points 0 points 0 points 0 points  What time? 0 points 0 points 0 points 0 points 0 points  Count back from 20 0 points 0 points 0 points 0 points 0 points  Months in reverse 0 points 0 points 0 points 0 points 0 points  Repeat phrase 0 points 0 points 0 points 0 points 0 points  Total Score 0 0 0 0 0    Immunizations Immunization History  Administered Date(s) Administered   Fluad Quad(high Dose 65+) 11/10/2020   Influenza Inj Mdck Quad Pf 11/28/2018   Influenza Split 01/10/2012   Influenza Whole 12/04/2009, 12/29/2010   Influenza,inj,Quad PF,6+ Mos 11/29/2012, 10/28/2013, 11/18/2014, 12/15/2015, 12/20/2016, 12/07/2017, 11/28/2018, 12/05/2019   Moderna SARS-COV2 Booster Vaccination 11/19/2020   Moderna Sars-Covid-2 Vaccination 05/18/2019, 06/19/2019, 11/07/2019, 08/26/2020   PNEUMOCOCCAL CONJUGATE-20 12/24/2020   PPD Test 11/09/2011   Pneumococcal Conjugate-13 08/24/2020   Tdap 12/29/2010   Zoster Recombinat (Shingrix) 01/24/2019, 04/01/2019    TDAP status: Due, Education has been provided regarding the importance of this vaccine. Advised may receive this vaccine at local pharmacy or Health Dept. Aware to provide a copy of the vaccination record if obtained from local pharmacy or Health Dept. Verbalized acceptance and understanding.  Flu Vaccine status: Up to date  Pneumococcal vaccine status: Declined,  Education has been provided regarding the importance of this vaccine but patient still declined. Advised may receive this vaccine at local pharmacy or Health Dept. Aware to provide a copy of the vaccination record if obtained from local pharmacy or Health Dept. Verbalized acceptance and understanding.    Covid-19 vaccine status: Completed vaccines  Qualifies for Shingles Vaccine? Yes   Zostavax completed Yes   Shingrix Completed?: Yes  Screening Tests Health Maintenance  Topic Date Due   TETANUS/TDAP  12/28/2020   COVID-19 Vaccine (5 - Booster for Moderna series) 01/14/2021   MAMMOGRAM  04/27/2023   COLONOSCOPY (Pts 45-77yr Insurance coverage will need to be confirmed)  01/09/2031   INFLUENZA VACCINE  Completed   Hepatitis C Screening  Completed   HIV Screening  Completed   Zoster Vaccines- Shingrix  Completed   HPV VACCINES  Aged Out   PAP SMEAR-Modifier  Discontinued    Health Maintenance  Health Maintenance Due  Topic Date Due   TETANUS/TDAP  12/28/2020   COVID-19 Vaccine (5 - Booster for Moderna series) 01/14/2021    Colorectal cancer screening: Type of screening: Colonoscopy. Completed 01/08/2021. Repeat every 10 years  Mammogram status: Completed 04/26/2021. Repeat every year  Bone Density Status: not due   Lung Cancer Screening: (Low Dose CT Chest recommended if Age 63-80years, 30 pack-year currently smoking OR have quit w/in 15years.) does not qualify.   Lung Cancer Screening Referral: n/a  Additional Screening:  Hepatitis C Screening: does qualify; Completed 04/02/2015  Vision Screening: Recommended annual ophthalmology exams for early detection of glaucoma and other disorders of the eye. Is the  patient up to date with their annual eye exam?  No , due Jul 07 2021 at [facility] is the provider or what is the name of the office in which the patient attends annual eye exams? [facility] If pt is not established with a provider, would they like to be referred to a provider to establish care?  N/a .   Dental Screening: Recommended annual dental exams for proper oral hygiene       Plan:     I have personally reviewed and noted the following in the patients chart:   Medical and social history Use of alcohol, tobacco or illicit drugs   Current medications and supplements including opioid prescriptions.  Functional ability and status Nutritional status Physical activity Advanced directives List of other physicians Hospitalizations, surgeries, and ER visits in previous 12 months Vitals Screenings to include cognitive, depression, and falls Referrals and appointments  In addition, I have reviewed and discussed with patient certain preventive protocols, quality metrics, and best practice recommendations. A written personalized care plan for preventive services as well as general preventive health recommendations were provided to patient.     Kathlyn Sacramento, RN   05/10/2021   Nurse Notes:   Ms. Marlett , Thank you for taking time to come for your Medicare Wellness Visit. I appreciate your ongoing commitment to your health goals. Please review the following plan we discussed and let me know if I can assist you in the future.   These are the goals we discussed:  Goals      Increase physical activity     Patient would like to increase her exercise to 5 days a week.      Quit smoking / using tobacco        This is a list of the screening recommended for you and due dates:  Health Maintenance  Topic Date Due   Tetanus Vaccine  12/28/2020   COVID-19 Vaccine (5 - Booster for Moderna series) 01/14/2021   Mammogram  04/27/2023   Colon Cancer Screening  01/09/2031   Flu Shot  Completed   Hepatitis C Screening: USPSTF Recommendation to screen - Ages 18-79 yo.  Completed   HIV Screening  Completed   Zoster (Shingles) Vaccine  Completed   HPV Vaccine  Aged Out   Pap Smear  Discontinued

## 2021-05-10 NOTE — Patient Instructions (Signed)
?  Jenna Woods , ?Thank you for taking time to come for your Medicare Wellness Visit. I appreciate your ongoing commitment to your health goals. Please review the following plan we discussed and let me know if I can assist you in the future.  ? ?These are the goals we discussed: ? Goals   ? ?  Increase physical activity   ?  Patient would like to increase her exercise to 5 days a week.  ?  ?  Quit smoking / using tobacco   ? ?  ?  ?This is a list of the screening recommended for you and due dates:  ?Health Maintenance  ?Topic Date Due  ? Tetanus Vaccine  12/28/2020  ? COVID-19 Vaccine (5 - Booster for Moderna series) 01/14/2021  ? Mammogram  04/27/2023  ? Colon Cancer Screening  01/09/2031  ? Flu Shot  Completed  ? Hepatitis C Screening: USPSTF Recommendation to screen - Ages 31-79 yo.  Completed  ? HIV Screening  Completed  ? Zoster (Shingles) Vaccine  Completed  ? HPV Vaccine  Aged Out  ? Pap Smear  Discontinued  ?  ?

## 2021-05-21 ENCOUNTER — Other Ambulatory Visit: Payer: Self-pay

## 2021-05-21 ENCOUNTER — Telehealth: Payer: Self-pay | Admitting: Family Medicine

## 2021-05-21 MED ORDER — TIZANIDINE HCL 4 MG PO TABS: 4.0000 mg | ORAL_TABLET | Freq: Four times a day (QID) | ORAL | 0 refills | Status: DC | PRN

## 2021-05-21 NOTE — Telephone Encounter (Signed)
Patient called in to see if provider could send in prescription for muscle spasms.  ? ?Patient has been taking extra strength tylenol but is is not helping.  ? ?Patient wants a call back. ?

## 2021-05-21 NOTE — Telephone Encounter (Signed)
Refill of tizanidine sent  ?

## 2021-06-08 ENCOUNTER — Other Ambulatory Visit: Payer: Self-pay

## 2021-06-08 ENCOUNTER — Ambulatory Visit: Payer: Medicare Other | Admitting: Family Medicine

## 2021-06-08 ENCOUNTER — Encounter: Payer: Self-pay | Admitting: Obstetrics & Gynecology

## 2021-06-08 ENCOUNTER — Ambulatory Visit (INDEPENDENT_AMBULATORY_CARE_PROVIDER_SITE_OTHER): Payer: Medicare Other | Admitting: Obstetrics & Gynecology

## 2021-06-08 VITALS — BP 127/89 | HR 85 | Ht 66.0 in | Wt 243.0 lb

## 2021-06-08 DIAGNOSIS — N993 Prolapse of vaginal vault after hysterectomy: Secondary | ICD-10-CM | POA: Diagnosis not present

## 2021-06-08 DIAGNOSIS — K219 Gastro-esophageal reflux disease without esophagitis: Secondary | ICD-10-CM

## 2021-06-08 DIAGNOSIS — Z4689 Encounter for fitting and adjustment of other specified devices: Secondary | ICD-10-CM | POA: Diagnosis not present

## 2021-06-08 MED ORDER — PANTOPRAZOLE SODIUM 40 MG PO TBEC: DELAYED_RELEASE_TABLET | ORAL | 1 refills | Status: DC

## 2021-06-08 NOTE — Progress Notes (Signed)
Chief Complaint  ?Patient presents with  ? Pessary Check  ? ? ?Blood pressure 127/89, pulse 85, height '5\' 6"'$  (1.676 m), weight 243 lb (110.2 kg). ? ?LABRITTANY WECHTER presents today for routine follow up related to her pessary.   ?She uses a Milex ring with support #2 ?She reports no vaginal discharge and no vaginal bleeding  ? ?Likert scale(1 not bothersome -5 very bothersome)  :  1 ? ?Exam reveals no undue vaginal mucosal pressure of breakdown, no discharge and no vaginal bleeding. ? ?Vaginal Epithelial Abnormality Classification System:   0 ?0    No abnormalities ?1    Epithelial erythema ?2    Granulation tissue ?3    Epithelial break or erosion, 1 cm or less ?4    Epithelial break or erosion, 1 cm or greater ? ?The pessary is removed, cleaned and replaced without difficulty.   ? ?  ICD-10-CM   ?1. Pessary maintenance, Milex ring with support #2  Z46.89   ?  ?2. Vaginal vault prolapse after hysterectomy  N99.3   ?  ?3. Pelvic relaxation due to vaginal vault prolapse, posthysterectomy, Milex ring with support #2  N99.3   ?  ?  ? ?MYKELLE COCKERELL will be sen back in 3 months for continued follow up. ? ?Florian Buff, MD  ?06/08/2021 ?12:31 PM ? ? ? ?

## 2021-06-09 ENCOUNTER — Telehealth (HOSPITAL_COMMUNITY): Payer: Self-pay | Admitting: Psychiatry

## 2021-06-09 ENCOUNTER — Encounter: Payer: Self-pay | Admitting: Family Medicine

## 2021-06-09 ENCOUNTER — Ambulatory Visit (INDEPENDENT_AMBULATORY_CARE_PROVIDER_SITE_OTHER): Payer: Medicare Other | Admitting: Family Medicine

## 2021-06-09 VITALS — BP 140/92 | HR 97 | Resp 16 | Ht 66.0 in | Wt 243.0 lb

## 2021-06-09 DIAGNOSIS — D126 Benign neoplasm of colon, unspecified: Secondary | ICD-10-CM

## 2021-06-09 DIAGNOSIS — K219 Gastro-esophageal reflux disease without esophagitis: Secondary | ICD-10-CM | POA: Diagnosis not present

## 2021-06-09 DIAGNOSIS — F418 Other specified anxiety disorders: Secondary | ICD-10-CM

## 2021-06-09 DIAGNOSIS — N39498 Other specified urinary incontinence: Secondary | ICD-10-CM

## 2021-06-09 DIAGNOSIS — R7302 Impaired glucose tolerance (oral): Secondary | ICD-10-CM

## 2021-06-09 DIAGNOSIS — I10 Essential (primary) hypertension: Secondary | ICD-10-CM | POA: Diagnosis not present

## 2021-06-09 DIAGNOSIS — F322 Major depressive disorder, single episode, severe without psychotic features: Secondary | ICD-10-CM

## 2021-06-09 MED ORDER — SPIRONOLACTONE 25 MG PO TABS: 25.0000 mg | ORAL_TABLET | Freq: Every day | ORAL | 3 refills | Status: DC

## 2021-06-09 NOTE — Telephone Encounter (Signed)
Called to schedule f/u appt sooner per Dr. Harrington Challenger, had to leave detailed vm requesting pt to call back and schedule  ?

## 2021-06-09 NOTE — Patient Instructions (Addendum)
F/U JUly 6 preferred by pt, call if you need me sooner ? ?I am sending Dr Ross a message and am deferring re additional medication for your depression till I hear from her ? ?Please continue to get out and exercise and be involved in community organizations which you enjoy like the [employer] and your Church to help you with regard to stress ? ?New additional medication  spironolactone 25 mg daily is added for your blood pressure ? ?Labs today chem 7 and eGFR, hBA1C ?I will remove oxempic and no additonal medication now for weight, your mental health is the biggest concern at this time ? ?BP will be rechecked before you leave, still high so new additoinal medication ? ?Thanks for choosing [facility], we consider it a privelige to serve you. ? ?

## 2021-06-10 LAB — BMP8+EGFR
BUN/Creatinine Ratio: 12 (ref 12–28)
BUN: 10 mg/dL (ref 8–27)
CO2: 22 mmol/L (ref 20–29)
Calcium: 10.1 mg/dL (ref 8.7–10.3)
Chloride: 101 mmol/L (ref 96–106)
Creatinine, Ser: 0.81 mg/dL (ref 0.57–1.00)
Glucose: 92 mg/dL (ref 70–99)
Potassium: 4.1 mmol/L (ref 3.5–5.2)
Sodium: 138 mmol/L (ref 134–144)
eGFR: 82 mL/min/{1.73_m2} (ref >59)

## 2021-06-10 LAB — HEMOGLOBIN A1C
Est. average glucose Bld gHb Est-mCnc: 126 mg/dL
Hgb A1c MFr Bld: 6 % — ABNORMAL HIGH (ref 4.8–5.6)

## 2021-06-11 ENCOUNTER — Telehealth (HOSPITAL_COMMUNITY): Payer: Self-pay | Admitting: Psychiatry

## 2021-06-11 ENCOUNTER — Encounter (HOSPITAL_COMMUNITY): Payer: Self-pay | Admitting: Psychiatry

## 2021-06-11 ENCOUNTER — Telehealth (INDEPENDENT_AMBULATORY_CARE_PROVIDER_SITE_OTHER): Payer: Medicare Other | Admitting: Psychiatry

## 2021-06-11 DIAGNOSIS — F411 Generalized anxiety disorder: Secondary | ICD-10-CM

## 2021-06-11 DIAGNOSIS — F322 Major depressive disorder, single episode, severe without psychotic features: Secondary | ICD-10-CM | POA: Diagnosis not present

## 2021-06-11 MED ORDER — MIRTAZAPINE 15 MG PO TABS: 15.0000 mg | ORAL_TABLET | Freq: Every day | ORAL | 2 refills | Status: DC

## 2021-06-11 MED ORDER — ALPRAZOLAM 1 MG PO TABS: 1.0000 mg | ORAL_TABLET | Freq: Three times a day (TID) | ORAL | 3 refills | Status: DC

## 2021-06-11 NOTE — Progress Notes (Signed)
Virtual Visit via Telephone Note ? ?I connected with Jenna Woods on 56/[birth year] at  9:00 AM EDT by telephone and verified that I am speaking with the correct person using two identifiers. ? ?Location: ?Patient: home ?Provider: office ?  ?I discussed the limitations, risks, security and privacy concerns of performing an evaluation and management service by telephone and the availability of in person appointments. I also discussed with the patient that there may be a patient responsible charge related to this service. The patient expressed understanding and agreed to proceed. ? ? ? ?  ?I discussed the assessment and treatment plan with the patient. The patient was provided an opportunity to ask questions and all were answered. The patient agreed with the plan and demonstrated an understanding of the instructions. ?  ?The patient was advised to call back or seek an in-person evaluation if the symptoms worsen or if the condition fails to improve as anticipated. ? ?I provided 15 minutes of non-face-to-face time during this encounter. ? ? ?Levonne Spiller, MD ? ?BH MD/PA/NP OP Progress Note ? ?09/06/4285 6:81 AM ?Jenna Woods  ?MRN:  [id number] ? ?Chief Complaint:  ?Chief Complaint  ?Patient presents with  ? Anxiety  ? Depression  ? Follow-up  ? ?HPI: This patient is a 61 year old divorced white female who lives alone in [place].  She is on disability.  The patient returns after 3 months regarding her depression anxiety and insomnia. ? ?Actually the patient's primary care physician, Dr. [name], messaged me yesterday because the patient has not been doing well.  She seemed to be more depressed and her PHQ 9 score was 16. ? ?Today the patient states that she has been very stressed.  She is having constant conflicts with her landlord and she feels like he is out to get her by constantly writing her up for various violations.  Furthermore 3 of her 4 close friends in the apartment complex have died recently.   She states that she has been more sad not finding much pleasure in anything energy is low although she continues to make herself do her daily walks and housework.  She is not sleeping well with the Ambien.  Her anxiety seems worse and sometimes she takes 1 more Xanax per day than prescribed.  I warned her not to do this because of addiction potential.  She denies any thoughts of self-harm or suicidal ideation.  She is eating fairly well.  I suggested that we get her back on an antidepressant such as mirtazapine which would also help her sleep.  We will also get her back into therapy with [name] in our office. ?Visit Diagnosis:  ?  ICD-10-CM   ?1. Current severe episode of major depressive disorder without psychotic features without prior episode ([place])  F32.2   ?  ?2. Anxiety state  F41.1   ?  ? ? ?Past Psychiatric History: none ? ?Past Medical History:  ?Past Medical History:  ?Diagnosis Date  ? Allergic reaction   ? Allergy   ? perrenial   ? Anxiety   ? Arthritis   ? Depression   ? h/o suicidal ideation in 2011  ? Depression with anxiety   ? Diverticulosis of colon 12/19/2015  ? Diverticulosis of colon with hemorrhage 12/19/2015  ? GERD (gastroesophageal reflux disease)   ? Hypertension 1996  ? Obesity   ? Peripheral vascular disease ([place])   ? Prediabetes 2013  ? Raynauds syndrome   ? Scleroderma ([place]) 1998  ? Systemic  lupus erythematosus ([place]) 1998  ? Treated at [facility]  ? Tobacco abuse, in remission   ? 10-pack-years discontinued in 2007  ? Urinary frequency   ?  ?Past Surgical History:  ?Procedure Laterality Date  ? ABDOMINAL HYSTERECTOMY  1990  ? Neoplasm  ? CESAREAN SECTION    ? x2  ? COLONOSCOPY  2005  ? COLONOSCOPY WITH PROPOFOL N/A 08/28/2015  ? Dr. Laural Golden: scattered medium-mouth diverticula entire colon, external hemorrhoids  ? COLONOSCOPY WITH PROPOFOL N/A 07/27/2016  ? Procedure: COLONOSCOPY WITH PROPOFOL;  Surgeon: Rogene Houston, MD;  [facility]: [facility];  Service: Endoscopy;  Laterality: N/A;   ? FINGER AMPUTATION    ? Third finger, bilateral, distal  ? INCISIONAL HERNIA REPAIR N/A 01/09/2019  ? Procedure: HERNIA REPAIR INCISIONAL;  Surgeon: Virl Cagey, MD;  Location: [facility];  Service: General;  Laterality: N/A;  ? INSERTION OF MESH N/A 01/09/2019  ? Procedure: INSERTION OF MESH;  Surgeon: Virl Cagey, MD;  Location: [facility];  Service: General;  Laterality: N/A;  ? NASAL SINUS SURGERY  09/06/2011  ? Procedure: ENDOSCOPIC SINUS SURGERY;  Surgeon: Ascencion Dike, MD;  Location: [facility];  Service: ENT;  Laterality: N/A;  Nasal cerebrospinal fluid leak repair with Left temporalis fascia graft and Left Ear cartilage graft  ? VASCULAR SURGERY    ? Hands, bilaterally, [facility]; right hand partial amputation of middle finger.  ? ? ?Family Psychiatric History: see below ? ?Family History:  ?Family History  ?Problem Relation Age of Onset  ? Arthritis Mother   ?     Rheumatoid  ? Dementia Mother   ? Hypertension Mother   ? Cirrhosis Father   ? Alcohol abuse Father   ? Arthritis Maternal Grandmother   ? Drug abuse Sister   ? Scleroderma Brother   ? Graves' disease Son   ? Colon cancer Neg Hx   ? ? ?Social History:  ?Social History  ? ?Socioeconomic History  ? Marital status: Single  ?  Spouse name: Not on file  ? Number of children: 2  ? Years of education: 77  ? Highest education level: 12th grade  ?Occupational History  ? Occupation: [employer]  ?  Employer: [employer]  ?  Comment: Disability awarded in 1998  ?Tobacco Use  ? Smoking status: Former  ?  Packs/day: 0.25  ?  Years: 25.00  ?  Pack years: 6.25  ?  Types: Cigarettes  ?  Quit date: 04/07/2001  ?  Years since quitting: 20.1  ? Smokeless tobacco: Never  ? Tobacco comments:  ?  quit 88 days ago!!  ?Vaping Use  ? Vaping Use: Never used  ?Substance and Sexual Activity  ? Alcohol use: No  ?  Alcohol/week: 0.0 standard drinks  ?  Comment: quit in 2004  ? Drug use: No  ?  Comment: use to use pot   ? Sexual activity:  Not Currently  ?  Birth control/protection: Surgical  ?  Comment: hyst  ?Other Topics Concern  ? Not on file  ?Social History Narrative  ? Currently living with son in [place], moving back soon   ? ?Social Determinants of Health  ? ?Financial Resource Strain: Low Risk   ? Difficulty of Paying Living Expenses: Not very hard  ?Food Insecurity: No Food Insecurity  ? Worried About Charity fundraiser in the Last Year: Never true  ? Ran Out of Food in the Last Year: Never true  ?  Transportation Needs: No Transportation Needs  ? Lack of Transportation (Medical): No  ? Lack of Transportation (Non-Medical): No  ?Physical Activity: Sufficiently Active  ? Days of Exercise per Week: 4 days  ? Minutes of Exercise per Session: 60 min  ?Stress: No Stress Concern Present  ? Feeling of Stress : Not at all  ?Social Connections: Moderately Integrated  ? Frequency of Communication with Friends and Family: More than three times a week  ? Frequency of Social Gatherings with Friends and Family: Three times a week  ? Attends Religious Services: More than 4 times per year  ? Active Member of Clubs or Organizations: Yes  ? Attends Archivist Meetings: 1 to 4 times per year  ? Marital Status: Divorced  ? ? ?Allergies:  ?Allergies  ?Allergen Reactions  ? Bee Venom Anaphylaxis  ? Sesame Oil Anaphylaxis and Swelling  ?  (Sesame seed)  ? Aspirin   ?  Diverticulosis with bleed  ? Molds & Smuts   ? Zofran [Ondansetron] Itching  ?  Generalized itching and had a rash on her thighs  ? Codeine Itching and Rash  ? ? ?Metabolic Disorder Labs: ?Lab Results  ?Component Value Date  ? HGBA1C 6.0 (H) 06/09/2021  ? MPG 123 04/01/2019  ? MPG 126 09/03/2018  ? ?No results found for: PROLACTIN ?Lab Results  ?Component Value Date  ? CHOL 148 12/24/2020  ? TRIG 81 12/24/2020  ? HDL 55 12/24/2020  ? CHOLHDL 2.7 12/24/2020  ? VLDL 15 06/30/2016  ? [place] 77 12/24/2020  ? [place] 78 04/01/2019  ? ?Lab Results  ?Component Value Date  ? TSH 0.906 12/24/2020   ? TSH 1.71 04/01/2019  ? ? ?Therapeutic Level Labs: ?No results found for: LITHIUM ?No results found for: VALPROATE ?No components found for:  CBMZ ? ?Current Medications: ?Current Outpatient Medications

## 2021-06-11 NOTE — Telephone Encounter (Signed)
Called to schedule appt with [name] -therapy pt was on the other line she advised she will return the call and schedule ?

## 2021-06-14 NOTE — Telephone Encounter (Signed)
Patient says she got your call to call office back. Can you please reach back out to her. Thanks ?

## 2021-06-17 ENCOUNTER — Encounter: Payer: Self-pay | Admitting: Family Medicine

## 2021-06-17 NOTE — Assessment & Plan Note (Signed)
Uncontrolled, med increase and reassess in 10 weeks ?DASH diet and commitment to daily physical activity for a minimum of 30 minutes discussed and encouraged, as a part of hypertension management. ?The importance of attaining a healthy weight is also discussed. ? ? ?  06/09/2021  ?  2:22 PM 06/09/2021  ?  1:26 PM 06/08/2021  ? 11:59 AM 12/24/2020  ? 10:58 AM 12/24/2020  ?  9:25 AM 09/01/2020  ?  8:48 AM 08/24/2020  ?  1:33 PM  ?BP/Weight  ?Systolic BP 863 817 711 657 116 122 126  ?Diastolic BP 92 91 89 83 83 87 90  ?Wt. (Lbs)  243 243 239.04 239.4 235 235  ?BMI  39.22 kg/m2 39.22 kg/m2 38.58 kg/m2 38.64 kg/m2 37.93 kg/m2 36.81 kg/m2  ? ? ? ? ? ?

## 2021-06-17 NOTE — Progress Notes (Signed)
? ?ILZE ROSELLI     MRN: [id number]      DOB: 02-14-1961 ? ? ?HPI ?Ms. [place] is here for follow up and re-evaluation of chronic medical conditions, medication management and review of any available recent lab and radiology data.  ?Preventive health is updated, specifically  Cancer screening and Immunization.   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 ?ROS ?Denies recent fever or chills. ?Denies sinus pressure, nasal congestion, ear pain or sore throat. ?Denies chest congestion, productive cough or wheezing. ?Denies chest pains, palpitations and leg swelling ?Denies abdominal pain, nausea, vomiting,diarrhea or constipation.   ?Denies dysuria, frequency, hesitancy or uncontrolled incontinence. ?Denies joint pain, swelling and limitation in mobility. ?Denies headaches, seizures, numbness, or tingling. ?. ?Denies skin break down or rash. ? ? ?PE ? ?BP (!) 140/92 (BP Location: Right Arm, Cuff Size: Large)   Pulse 97   Resp 16   Ht '5\' 6"'$  (1.676 m)   Wt 243 lb (110.2 kg)   SpO2 95%   BMI 39.22 kg/m?  ? ?Patient alert and oriented and in no cardiopulmonary distress. ? ?HEENT: No facial asymmetry, EOMI,     Neck supple . ? ?Chest: Clear to auscultation bilaterally. ? ?CVS: S1, S2 no murmurs, no S3.Regular rate. ? ?ABD: Soft non tender.  ? ?Ext: No edema ? ?MS: Adequate ROM spine, shoulders, hips and knees. ? ?Skin: Intact, no ulcerations or rash noted. ? ?Psych: Good eye contact, nt. Memory intact very anxious and depressed appearing. ? ?CNS: CN 2-12 intact, power,  normal throughout.no focal deficits noted. ? ? ?Assessment & Plan ? ?Major depression ?No med change, will defer to Psych, and will reach out directly to let her know currnet situation ? ?Morbid obesity  ([place]) ? ?Patient re-educated about  the importance of commitment to a  minimum of 150 minutes of exercise per week as able. ? ?The importance of healthy food choices with portion control discussed, as well as eating regularly and within a 12 hour window most days. ?The need to choose "clean , green" food 50 to 75% of the time is discussed, as well as to make water the primary drink and set a goal of 64 ounces water daily. ? ?  ? ?  06/09/2021  ?  1:26 PM 06/08/2021  ? 11:59 AM 12/24/2020  ? 10:58 AM  ?Weight /BMI  ?Weight 243 lb 243 lb 239 lb 0.6 oz  ?Height '5\' 6"'$  (1.676 m) '5\' 6"'$  (1.676 m) '5\' 6"'$  (1.676 m)  ?BMI 39.22 kg/m2 39.22 kg/m2 38.58 kg/m2  ? ? ? ? ?Essential hypertension ?Uncontrolled, med increase and reassess in 10 weeks ?DASH diet and commitment to daily physical activity for a minimum of 30 minutes discussed and encouraged, as a part of hypertension management. ?The importance of attaining a healthy weight is also discussed. ? ? ?  06/09/2021  ?  2:22 PM 06/09/2021  ?  1:26 PM 06/08/2021  ? 11:59 AM 12/24/2020  ? 10:58 AM 12/24/2020  ?  9:25 AM 09/01/2020  ?  8:48 AM 08/24/2020  ?  1:33 PM  ?BP/Weight  ?Systolic BP 387 564 332 951 116 122 126  ?Diastolic BP 92 91 89 83 83 87 90  ?Wt. (Lbs)  243 243 239.04 239.4 235 235  ?BMI  39.22 kg/m2 39.22 kg/m2 38.58 kg/m2 38.64 kg/m2 37.93 kg/m2 36.81 kg/m2  ? ? ? ? ? ? ?Depression with anxiety ?Uncontrolled , message to Psych for sooner appt ? ?GERD (gastroesophageal reflux disease) ?Controlled, no change in medication ? ? ?Urinary incontinence ?Controlled, no change in medication ? ? ?

## 2021-06-17 NOTE — Assessment & Plan Note (Signed)
Uncontrolled , message to Psych for sooner appt ?

## 2021-06-17 NOTE — Assessment & Plan Note (Signed)
Controlled, no change in medication  

## 2021-06-17 NOTE — Assessment & Plan Note (Addendum)
?  Patient re-educated about  the importance of commitment to a  minimum of 150 minutes of exercise per week as able. ? ?The importance of healthy food choices with portion control discussed, as well as eating regularly and within a 12 hour window most days. ?The need to choose "clean , green" food 50 to 75% of the time is discussed, as well as to make water the primary drink and set a goal of 64 ounces water daily. ? ?  ? ?  06/09/2021  ?  1:26 PM 06/08/2021  ? 11:59 AM 12/24/2020  ? 10:58 AM  ?Weight /BMI  ?Weight 243 lb 243 lb 239 lb 0.6 oz  ?Height '5\' 6"'$  (1.676 m) '5\' 6"'$  (1.676 m) '5\' 6"'$  (1.676 m)  ?BMI 39.22 kg/m2 39.22 kg/m2 38.58 kg/m2  ? ? ? ?

## 2021-06-17 NOTE — Assessment & Plan Note (Signed)
No med change, will defer to Psych, and will reach out directly to let her know currnet situation ?

## 2021-07-20 ENCOUNTER — Other Ambulatory Visit: Payer: Self-pay

## 2021-07-20 DIAGNOSIS — M329 Systemic lupus erythematosus, unspecified: Secondary | ICD-10-CM

## 2021-07-20 MED ORDER — MIRABEGRON ER 25 MG PO TB24: ORAL_TABLET | ORAL | 5 refills | Status: DC

## 2021-07-20 MED ORDER — AMLODIPINE BESYLATE 10 MG PO TABS: 10.0000 mg | ORAL_TABLET | Freq: Every day | ORAL | 3 refills | Status: DC

## 2021-08-03 DIAGNOSIS — H43813 Vitreous degeneration, bilateral: Secondary | ICD-10-CM | POA: Diagnosis not present

## 2021-08-03 DIAGNOSIS — H2513 Age-related nuclear cataract, bilateral: Secondary | ICD-10-CM | POA: Diagnosis not present

## 2021-08-06 ENCOUNTER — Encounter (HOSPITAL_COMMUNITY): Payer: Self-pay | Admitting: Psychiatry

## 2021-08-06 ENCOUNTER — Telehealth (INDEPENDENT_AMBULATORY_CARE_PROVIDER_SITE_OTHER): Payer: Medicare Other | Admitting: Psychiatry

## 2021-08-06 DIAGNOSIS — F322 Major depressive disorder, single episode, severe without psychotic features: Secondary | ICD-10-CM | POA: Diagnosis not present

## 2021-08-06 DIAGNOSIS — F5105 Insomnia due to other mental disorder: Secondary | ICD-10-CM

## 2021-08-06 DIAGNOSIS — F411 Generalized anxiety disorder: Secondary | ICD-10-CM

## 2021-08-06 MED ORDER — MIRTAZAPINE 15 MG PO TABS: 15.0000 mg | ORAL_TABLET | Freq: Every day | ORAL | 2 refills | Status: DC

## 2021-08-06 MED ORDER — ZOLPIDEM TARTRATE 10 MG PO TABS: ORAL_TABLET | ORAL | 3 refills | Status: DC

## 2021-08-06 MED ORDER — ALPRAZOLAM 1 MG PO TABS: 1.0000 mg | ORAL_TABLET | Freq: Three times a day (TID) | ORAL | 3 refills | Status: DC
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
## 2021-08-11 ENCOUNTER — Telehealth: Payer: Self-pay | Admitting: Family Medicine

## 2021-08-11 LAB — HEMOGLOBIN A1C: Hemoglobin A1C: 6.4

## 2021-08-11 NOTE — Telephone Encounter (Signed)
Documented in chart.

## 2021-08-11 NOTE — Telephone Encounter (Signed)
Salome Arnt, PA with [place] calls called to report an abnormal non-critical alert for Hemoglobin A1C of 6.4 on pt. States she has no other diabetic sx.       Salome Arnt, [place] Calls [phone]

## 2021-09-09 ENCOUNTER — Ambulatory Visit (INDEPENDENT_AMBULATORY_CARE_PROVIDER_SITE_OTHER): Payer: Medicare Other | Admitting: Family Medicine

## 2021-09-09 ENCOUNTER — Ambulatory Visit (INDEPENDENT_AMBULATORY_CARE_PROVIDER_SITE_OTHER): Payer: Medicare Other | Admitting: Obstetrics & Gynecology

## 2021-09-09 ENCOUNTER — Encounter: Payer: Self-pay | Admitting: Obstetrics & Gynecology

## 2021-09-09 VITALS — BP 125/91 | HR 100 | Wt 247.0 lb

## 2021-09-09 VITALS — BP 124/88 | HR 93 | Ht 66.0 in | Wt 246.1 lb

## 2021-09-09 DIAGNOSIS — I10 Essential (primary) hypertension: Secondary | ICD-10-CM | POA: Diagnosis not present

## 2021-09-09 DIAGNOSIS — Z4689 Encounter for fitting and adjustment of other specified devices: Secondary | ICD-10-CM

## 2021-09-09 DIAGNOSIS — F418 Other specified anxiety disorders: Secondary | ICD-10-CM

## 2021-09-09 DIAGNOSIS — N39498 Other specified urinary incontinence: Secondary | ICD-10-CM | POA: Diagnosis not present

## 2021-09-09 DIAGNOSIS — N993 Prolapse of vaginal vault after hysterectomy: Secondary | ICD-10-CM

## 2021-09-09 DIAGNOSIS — R7302 Impaired glucose tolerance (oral): Secondary | ICD-10-CM | POA: Diagnosis not present

## 2021-09-09 MED ORDER — PHENTERMINE HCL 15 MG PO CAPS: 15.0000 mg | ORAL_CAPSULE | ORAL | 1 refills | Status: DC

## 2021-09-09 MED ORDER — TIZANIDINE HCL 4 MG PO TABS: ORAL_TABLET | ORAL | 1 refills | Status: DC

## 2021-09-09 NOTE — Progress Notes (Signed)
Jenna Woods     MRN: [id number]      DOB: March 04, 1961   HPI Jenna Woods is here for follow up and re-evaluation of chronic medical conditions, medication management and review of any available recent lab and radiology data.  Preventive health is updated, specifically  Cancer screening and Immunization.   Questions or concerns regarding consultations or procedures which the PT has had in the interim are  addressed. The PT denies any adverse reactions to current medications since the last visit.  C/o weight gain despite exercise routine ROS Denies recent fever or chills. Denies sinus pressure, nasal congestion, ear pain or sore throat. Denies chest congestion, productive cough or wheezing. Denies chest pains, palpitations and leg swelling Denies abdominal pain, nausea, vomiting,diarrhea or constipation.   Denies dysuria, frequency, hesitancy or incontinence. Denies joint pain, swelling and limitation in mobility. Denies headaches, seizures, numbness, or tingling. Denies uncontrolled depression, anxiety or insomnia.Improved since last visit Denies skin break down or rash.   PE  BP 124/88   Pulse 93   Ht '5\' 6"'$  (1.676 m)   Wt 246 lb 1.9 oz (111.6 kg)   SpO2 95%   BMI 39.72 kg/m   Patient alert and oriented and in no cardiopulmonary distress.  HEENT: No facial asymmetry, EOMI,     Neck supple .  Chest: Clear to auscultation bilaterally.  CVS: S1, S2 no murmurs, no S3.Regular rate.  ABD: Soft non tender.   Ext: No edema  MS: Adequate ROM spine, shoulders, hips and knees.  Skin: Intact, no ulcerations or rash noted.  Psych: Good eye contact, normal affect. Memory intact not anxious or depressed appearing.  CNS: CN 2-12 intact, power,  normal throughout.no focal deficits noted.   Assessment & Plan  Essential hypertension Controlled, no change in medication DASH diet and commitment to daily physical activity for a minimum of 30 minutes discussed and encouraged,  as a part of hypertension management. The importance of attaining a healthy weight is also discussed.     09/09/2021    3:17 PM 09/09/2021    1:31 PM 09/09/2021   12:03 PM 06/09/2021    2:22 PM 06/09/2021    1:26 PM 06/08/2021   11:59 AM 12/24/2020   10:58 AM  BP/Weight  Systolic BP 151 761 607 371 062 694 854  Diastolic BP 88 81 91 92 91 89 83  Wt. (Lbs)  246.12 247  243 243 239.04  BMI  39.72 kg/m2 39.87 kg/m2  39.22 kg/m2 39.22 kg/m2 38.58 kg/m2       Morbid obesity (HCC) Worsening, start phentermine DASH diet and commitment to daily physical activity for a minimum of 30 minutes discussed and encouraged, as a part of hypertension management. The importance of attaining a healthy weight is also discussed.     09/09/2021    3:17 PM 09/09/2021    1:31 PM 09/09/2021   12:03 PM 06/09/2021    2:22 PM 06/09/2021    1:26 PM 06/08/2021   11:59 AM 12/24/2020   10:58 AM  BP/Weight  Systolic BP 627 035 009 381 829 937 169  Diastolic BP 88 81 91 92 91 89 83  Wt. (Lbs)  246.12 247  243 243 239.04  BMI  39.72 kg/m2 39.87 kg/m2  39.22 kg/m2 39.22 kg/m2 38.58 kg/m2       Urinary incontinence Controlled, no change in medication   IGT (impaired glucose tolerance) Patient educated about the importance of limiting  Carbohydrate intake , the need to  commit to daily physical activity for a minimum of 30 minutes , and to commit weight loss. The fact that changes in all these areas will reduce or eliminate all together the development of diabetes is stressed.      Latest Ref Rng & Units 08/11/2021   12:00 AM 06/09/2021    2:41 PM 12/24/2020   11:43 AM 10/02/2019    4:40 PM 04/01/2019    8:33 AM  Diabetic Labs  HbA1c  6.4     6.0  5.8   5.9   Chol 100 - 199 mg/dL   148   145   HDL >39 mg/dL   55   50   Calc LDL 0 - 99 mg/dL   77   78   Triglycerides 0 - 149 mg/dL   81   91   Creatinine 0.57 - 1.00 mg/dL  0.81  0.69  0.72  0.72      This result is from an external source.      09/09/2021    3:17 PM  09/09/2021    1:31 PM 09/09/2021   12:03 PM 06/09/2021    2:22 PM 06/09/2021    1:26 PM 06/08/2021   11:59 AM 12/24/2020   10:58 AM  BP/Weight  Systolic BP 115 726 203 559 741 638 453  Diastolic BP 88 81 91 92 91 89 83  Wt. (Lbs)  246.12 247  243 243 239.04  BMI  39.72 kg/m2 39.87 kg/m2  39.22 kg/m2 39.22 kg/m2 38.58 kg/m2       No data to display            Depression with anxiety Improved, managed by Psych

## 2021-09-09 NOTE — Patient Instructions (Addendum)
F/u in 2 months, call if you need me sooner  New for help with weight loss is phentermine 15 mg once daily  Eat between 7 am and 7 pm and only snack on vegetables and fruit  Drink 64 ounces water daily  Only packaged cereal is shredded wheat or all bran  Boiled eggs and fruit are a good breakfast!  Continue regular exercise  Thanks for choosing [facility], we consider it a privelige to serve you.

## 2021-09-09 NOTE — Progress Notes (Signed)
Chief Complaint  Patient presents with   Pessary Maintenance    Blood pressure (!) 125/91, pulse 100, weight 247 lb (112 kg).  Jenna Woods presents today for routine follow up related to her pessary.   She uses a Milex ring with support #2 She reports no vaginal discharge and no vaginal bleeding   Likert scale(1 not bothersome -5 very bothersome)  :  1  Exam reveals no undue vaginal mucosal pressure of breakdown, no discharge and no vaginal bleeding.  Vaginal Epithelial Abnormality Classification System:   0 0    No abnormalities 1    Epithelial erythema 2    Granulation tissue 3    Epithelial break or erosion, 1 cm or less 4    Epithelial break or erosion, 1 cm or greater  The pessary is removed, cleaned and replaced without difficulty.    No diagnosis found.   Jenna Woods will be sen back in 4 months for continued follow up.  Florian Buff, MD  09/09/2021 12:17 PM

## 2021-09-10 ENCOUNTER — Other Ambulatory Visit: Payer: Self-pay

## 2021-09-10 MED ORDER — SPIRONOLACTONE 25 MG PO TABS: 25.0000 mg | ORAL_TABLET | Freq: Every day | ORAL | 3 refills | Status: DC

## 2021-09-20 ENCOUNTER — Encounter: Payer: Self-pay | Admitting: Family Medicine

## 2021-09-20 NOTE — Assessment & Plan Note (Signed)
Controlled, no change in medication DASH diet and commitment to daily physical activity for a minimum of 30 minutes discussed and encouraged, as a part of hypertension management. The importance of attaining a healthy weight is also discussed.     09/09/2021    3:17 PM 09/09/2021    1:31 PM 09/09/2021   12:03 PM 06/09/2021    2:22 PM 06/09/2021    1:26 PM 06/08/2021   11:59 AM 12/24/2020   10:58 AM  BP/Weight  Systolic BP 125 087 199 412 904 753 391  Diastolic BP 88 81 91 92 91 89 83  Wt. (Lbs)  246.12 247  243 243 239.04  BMI  39.72 kg/m2 39.87 kg/m2  39.22 kg/m2 39.22 kg/m2 38.58 kg/m2

## 2021-09-20 NOTE — Assessment & Plan Note (Signed)
Patient educated about the importance of limiting  Carbohydrate intake , the need to commit to daily physical activity for a minimum of 30 minutes , and to commit weight loss. The fact that changes in all these areas will reduce or eliminate all together the development of diabetes is stressed.      Latest Ref Rng & Units 08/11/2021   12:00 AM 06/09/2021    2:41 PM 12/24/2020   11:43 AM 10/02/2019    4:40 PM 04/01/2019    8:33 AM  Diabetic Labs  HbA1c  6.4     6.0  5.8   5.9   Chol 100 - 199 mg/dL   148   145   HDL >39 mg/dL   55   50   Calc LDL 0 - 99 mg/dL   77   78   Triglycerides 0 - 149 mg/dL   81   91   Creatinine 0.57 - 1.00 mg/dL  0.81  0.69  0.72  0.72      This result is from an external source.      09/09/2021    3:17 PM 09/09/2021    1:31 PM 09/09/2021   12:03 PM 06/09/2021    2:22 PM 06/09/2021    1:26 PM 06/08/2021   11:59 AM 12/24/2020   10:58 AM  BP/Weight  Systolic BP 761 518 343 735 789 784 784  Diastolic BP 88 81 91 92 91 89 83  Wt. (Lbs)  246.12 247  243 243 239.04  BMI  39.72 kg/m2 39.87 kg/m2  39.22 kg/m2 39.22 kg/m2 38.58 kg/m2       No data to display

## 2021-09-20 NOTE — Assessment & Plan Note (Signed)
Worsening, start phentermine DASH diet and commitment to daily physical activity for a minimum of 30 minutes discussed and encouraged, as a part of hypertension management. The importance of attaining a healthy weight is also discussed.     09/09/2021    3:17 PM 09/09/2021    1:31 PM 09/09/2021   12:03 PM 06/09/2021    2:22 PM 06/09/2021    1:26 PM 06/08/2021   11:59 AM 12/24/2020   10:58 AM  BP/Weight  Systolic BP 563 893 734 287 681 157 262  Diastolic BP 88 81 91 92 91 89 83  Wt. (Lbs)  246.12 247  243 243 239.04  BMI  39.72 kg/m2 39.87 kg/m2  39.22 kg/m2 39.22 kg/m2 38.58 kg/m2

## 2021-09-20 NOTE — Assessment & Plan Note (Signed)
Improved , managed by Psych 

## 2021-09-20 NOTE — Assessment & Plan Note (Signed)
Controlled, no change in medication  

## 2021-09-28 ENCOUNTER — Encounter: Payer: Self-pay | Admitting: Family Medicine

## 2021-09-28 ENCOUNTER — Ambulatory Visit (INDEPENDENT_AMBULATORY_CARE_PROVIDER_SITE_OTHER): Payer: Medicare Other | Admitting: Family Medicine

## 2021-09-28 DIAGNOSIS — U071 COVID-19: Secondary | ICD-10-CM | POA: Diagnosis not present

## 2021-09-28 MED ORDER — BENZONATATE 100 MG PO CAPS: 100.0000 mg | ORAL_CAPSULE | Freq: Two times a day (BID) | ORAL | 0 refills | Status: DC | PRN

## 2021-09-28 MED ORDER — PROMETHAZINE-DM 6.25-15 MG/5ML PO SYRP: ORAL_SOLUTION | ORAL | 0 refills | Status: DC

## 2021-09-28 MED ORDER — NIRMATRELVIR/RITONAVIR (PAXLOVID)TABLET
3.0000 | ORAL_TABLET | Freq: Two times a day (BID) | ORAL | 0 refills | Status: AC
Start: 2021-09-28 — End: 2021-10-03

## 2021-09-28 MED ORDER — PREDNISONE 10 MG PO TABS: 10.0000 mg | ORAL_TABLET | Freq: Two times a day (BID) | ORAL | 0 refills | Status: DC

## 2021-09-28 NOTE — Progress Notes (Signed)
  Virtual Visit via Video Note  I connected with [name] on 66/06/30 at 10:40 AM EDT by a video enabled telemedicine application and verified that I am speaking with the correct person using two identifiers.  Location: Patient: home Provider: officew   I discussed the limitations of evaluation and management by telemedicine and the availability of in person appointments. The patient expressed understanding and agreed to proceed.  History of Present Illness: [name] to [name] this past Sunday, feeling well and came home coughing, by 7 pm that evening, cough is persistent , and excessive, has body aches and chills and headache, also no taste for food , nausea, no vomit, and loose stool today   Observations/Objective: There were no vitals taken for this visit. Good communication with no confusion and intact memory. Alert and oriented x 3 Head congestion and bronchitic cough   Assessment and Plan: Positive self-administered antigen test for COVID-19 Symptomatic with positive covid home test, paxlovid, Thessalon perles, prednisone and phenergan dm, also tylenol alt with ibuprofen and push fluids   Follow Up Instructions:    I discussed the assessment and treatment plan with the patient. The patient was provided an opportunity to ask questions and all were answered. The patient agreed with the plan and demonstrated an understanding of the instructions.   The patient was advised to call back or seek an in-person evaluation if the symptoms worsen or if the condition fails to improve as anticipated.  I provided 13 minutes of non-face-to-face time during this encounter.   [name], MD

## 2021-09-28 NOTE — Patient Instructions (Addendum)
F/U as before, call if you need me sooner  Go to the eD if symptoms worsen oe you develop shortness of breath and difficulty breathing  Five day course of paxlovid is prescribed, need to taker the entire course to treat Covid  For congestion and cough, tessalon perles and 5 day course of prednisone is prescribed, also a cough supppressanrt at bedtime as needed, one teaspoon  Please use tylenol 325 mg or ibuprofen 200 mg one evry 6 to 8 hours alternating for fever, chills or body aches Limited Zofran prescribed for nausea  Try to ensure adequate fluid intake, keep hydrated and eat small amounts of food as able  Thanks for choosing [facility], we consider it a privelige to serve you.

## 2021-09-28 NOTE — Assessment & Plan Note (Signed)
Symptomatic with positive covid home test, paxlovid, Thessalon perles, prednisone and phenergan dm, also tylenol alt with ibuprofen and push fluids

## 2021-10-11 ENCOUNTER — Telehealth: Payer: Self-pay | Admitting: Family Medicine

## 2021-10-11 NOTE — Telephone Encounter (Signed)
Needs telephone visit with available provider

## 2021-10-11 NOTE — Telephone Encounter (Signed)
Patient called in regard to  promethazine-dextromethorphan (PROMETHAZINE-DM) 6.25-15 MG/5ML syrup   Patient is still having cough med and congestion  Wants to see if can get refill

## 2021-10-12 ENCOUNTER — Ambulatory Visit (INDEPENDENT_AMBULATORY_CARE_PROVIDER_SITE_OTHER): Payer: Medicare Other | Admitting: Family Medicine

## 2021-10-12 DIAGNOSIS — U099 Post covid-19 condition, unspecified: Secondary | ICD-10-CM | POA: Diagnosis not present

## 2021-10-12 DIAGNOSIS — R053 Chronic cough: Secondary | ICD-10-CM

## 2021-10-12 MED ORDER — CETIRIZINE HCL 10 MG PO TABS: 10.0000 mg | ORAL_TABLET | Freq: Every day | ORAL | 11 refills | Status: DC

## 2021-10-12 MED ORDER — PREDNISONE 20 MG PO TABS: ORAL_TABLET | ORAL | 0 refills | Status: DC

## 2021-10-12 MED ORDER — PROMETHAZINE-DM 6.25-15 MG/5ML PO SYRP: ORAL_SOLUTION | ORAL | 0 refills | Status: DC

## 2021-10-12 NOTE — Progress Notes (Unsigned)
Virtual Visit via telephone Note  I connected with Jenna Woods on 33/58/25 at 11:20 AM EDT by a video enabled telemedicine application and verified that I am speaking with the correct person using two identifiers.  Location: Patient: home  Provider: office    I discussed the limitations of evaluation and management by telemedicine and the availability of in person appointments. The patient expressed understanding and agreed to proceed.  History of Present Illness:   fells much better 90 % from covid, however clear runny nose and chest congestion, denies fever or chills Observations/Objective: There were no vitals taken for this visit. Good communication with no confusion and intact memory. Alert and oriented x 3 Head and chest congestion apparent during interview, cough and nasal / sinus congestion Assessment and Plan: Post-COVID chronic cough Short course of prednisone and additional phenergan DM   Follow Up Instructions:    I discussed the assessment and treatment plan with the patient. The patient was provided an opportunity to ask questions and all were answered. The patient agreed with the plan and demonstrated an understanding of the instructions.   The patient was advised to call back or seek an in-person evaluation if the symptoms worsen or if the condition fails to improve as anticipated.  I provided 7 minutes of non-face-to-face time during this encounter.   [name], MD

## 2021-10-12 NOTE — Patient Instructions (Signed)
F/u as before, call if you need me sooner    3 medications sent in for excess congestion and allergy symptoms  Thanks for choosing [facility], we consider it a privelige to serve you.

## 2021-10-13 ENCOUNTER — Encounter: Payer: Self-pay | Admitting: Family Medicine

## 2021-10-13 ENCOUNTER — Telehealth: Payer: Self-pay | Admitting: Family Medicine

## 2021-10-13 DIAGNOSIS — U099 Post covid-19 condition, unspecified: Secondary | ICD-10-CM | POA: Insufficient documentation

## 2021-10-13 NOTE — Telephone Encounter (Signed)
Patient called in regard to prescriptions from last visit 8/7 Pharm has not received order for   promethazine-dextromethorphan (PROMETHAZINE-DM) 6.25-15 MG/5ML syrup

## 2021-10-13 NOTE — Telephone Encounter (Signed)
[facility] confirmed [facility] received it yesterday at 11:44. She may have called them before the order was processed. She needs to call them back today

## 2021-10-13 NOTE — Assessment & Plan Note (Signed)
Short course of prednisone and additional phenergan DM

## 2021-11-01 ENCOUNTER — Telehealth (INDEPENDENT_AMBULATORY_CARE_PROVIDER_SITE_OTHER): Payer: Medicare Other | Admitting: Psychiatry

## 2021-11-01 ENCOUNTER — Encounter (HOSPITAL_COMMUNITY): Payer: Self-pay | Admitting: Psychiatry

## 2021-11-01 DIAGNOSIS — F411 Generalized anxiety disorder: Secondary | ICD-10-CM

## 2021-11-01 DIAGNOSIS — F5105 Insomnia due to other mental disorder: Secondary | ICD-10-CM | POA: Diagnosis not present

## 2021-11-01 DIAGNOSIS — F331 Major depressive disorder, recurrent, moderate: Secondary | ICD-10-CM | POA: Diagnosis not present

## 2021-11-01 MED ORDER — ALPRAZOLAM 1 MG PO TABS: 1.0000 mg | ORAL_TABLET | Freq: Three times a day (TID) | ORAL | 3 refills | Status: DC

## 2021-11-01 MED ORDER — MIRTAZAPINE 15 MG PO TABS
15.0000 mg | ORAL_TABLET | Freq: Every day | ORAL | 3 refills | Status: DC
Start: 2021-11-01 — End: 2022-02-02

## 2021-11-01 MED ORDER — ZOLPIDEM TARTRATE 10 MG PO TABS
ORAL_TABLET | ORAL | 3 refills | Status: DC
Start: 2021-11-01 — End: 2022-03-02

## 2021-11-01 NOTE — Progress Notes (Signed)
Virtual Visit via Telephone Note  I connected with Jenna Woods on 15/40/08 at  1:20 PM EDT by telephone and verified that I am speaking with the correct person using two identifiers.  Location: Patient: home Provider: office   I discussed the limitations, risks, security and privacy concerns of performing an evaluation and management service by telephone and the availability of in person appointments. I also discussed with the patient that there may be a patient responsible charge related to this service. The patient expressed understanding and agreed to proceed.     I discussed the assessment and treatment plan with the patient. The patient was provided an opportunity to ask questions and all were answered. The patient agreed with the plan and demonstrated an understanding of the instructions.   The patient was advised to call back or seek an in-person evaluation if the symptoms worsen or if the condition fails to improve as anticipated.  I provided 15 minutes of non-face-to-face time during this encounter.   Levonne Spiller, MD  [facility] MD/PA/NP OP Progress Note  6/76/1952 9:32 PM Jenna Woods  MRN:  [id number]  Chief Complaint:  Chief Complaint  Patient presents with   Depression   Anxiety   Follow-up   HPI: This patient is an 61 year old divorced white female who lives alone in [place].  She is on disability. The patient returns for follow-up after 3 months regarding her depression and anxiety.  She states that overall she is doing better.  She is still struggling with her landlord whom she claims is "hateful" to the residents.  She is hoping to save up some money and move back to [place] soon.  She also had COVID last month and was sick for 3 weeks.  Fortunately she is feeling much better now.  The patient denies significant depression anxiety thoughts of self-harm or difficulty sleeping.  She does feel like her medications are working well  Visit Diagnosis:     ICD-10-CM   1. Anxiety state  F41.1     2. Major depressive disorder, recurrent episode, moderate (HCC)  F33.1     3. Insomnia due to mental disorder  F51.05       Past Psychiatric History: none  Past Medical History:  Past Medical History:  Diagnosis Date   Allergic reaction    Allergy    perrenial    Anxiety    Arthritis    Depression    h/o suicidal ideation in 2011   Depression with anxiety    Diverticulosis of colon 12/19/2015   Diverticulosis of colon with hemorrhage 12/19/2015   GERD (gastroesophageal reflux disease)    Hypertension 1996   Obesity    Peripheral vascular disease ([place])    Prediabetes 2013   Raynauds syndrome    Scleroderma ([place]) 1998   Systemic lupus erythematosus ([place]) 1998   Treated at [facility]   Tobacco abuse, in remission    10-pack-years discontinued in 2007   Urinary frequency     Past Surgical History:  Procedure Laterality Date   ABDOMINAL HYSTERECTOMY  1990   Neoplasm   CESAREAN SECTION     x2   COLONOSCOPY  2005   COLONOSCOPY WITH PROPOFOL N/A 08/28/2015   Dr. Laural Golden: scattered medium-mouth diverticula entire colon, external hemorrhoids   COLONOSCOPY WITH PROPOFOL N/A 07/27/2016   Procedure: COLONOSCOPY WITH PROPOFOL;  Surgeon: Rogene Houston, MD;  [facility];  Service: Endoscopy;  Laterality: N/A;   FINGER AMPUTATION     Third  finger, bilateral, distal   INCISIONAL HERNIA REPAIR N/A 01/09/2019   Procedure: HERNIA REPAIR INCISIONAL;  Surgeon: Virl Cagey, MD;  Location: [facility] ORS;  Service: General;  Laterality: N/A;   INSERTION OF MESH N/A 01/09/2019   Procedure: INSERTION OF MESH;  Surgeon: Virl Cagey, MD;  Location: [facility];  Service: General;  Laterality: N/A;   NASAL SINUS SURGERY  09/06/2011   Procedure: ENDOSCOPIC SINUS SURGERY;  Surgeon: Ascencion Dike, MD;  Location: [facility];  Service: ENT;  Laterality: N/A;  Nasal cerebrospinal fluid leak repair with Left temporalis fascia graft and Left  Ear cartilage graft   VASCULAR SURGERY     Hands, bilaterally, [facility]; right hand partial amputation of middle finger.    Family Psychiatric History: See below  Family History:  Family History  Problem Relation Age of Onset   Arthritis Mother        Rheumatoid   Dementia Mother    Hypertension Mother    Cirrhosis Father    Alcohol abuse Father    Arthritis Maternal Grandmother    Drug abuse Sister    Scleroderma Brother    Berenice Primas' disease Son    Colon cancer Neg Hx     Social History:  Social History   Socioeconomic History   Marital status: Single    Spouse name: Not on file   Number of children: 2   Years of education: 12   Highest education level: 12th grade  Occupational History   Occupation: Art therapist: [employer]    Comment: Disability awarded in 1998  Tobacco Use   Smoking status: Former    Packs/day: 0.25    Years: 25.00    Total pack years: 6.25    Types: Cigarettes    Quit date: 04/07/2001    Years since quitting: 20.5   Smokeless tobacco: Never   Tobacco comments:    quit 88 days ago!!  Vaping Use   Vaping Use: Never used  Substance and Sexual Activity   Alcohol use: No    Alcohol/week: 0.0 standard drinks of alcohol    Comment: quit in 2004   Drug use: No    Comment: use to use pot    Sexual activity: Not Currently    Birth control/protection: Surgical    Comment: hyst  Other Topics Concern   Not on file  Social History Narrative   Currently living with son in [place], moving back soon    Social Determinants of Health   Financial Resource Strain: Low Risk  (05/10/2021)   Overall Financial Resource Strain (CARDIA)    Difficulty of Paying Living Expenses: Not very hard  Food Insecurity: No Food Insecurity (05/10/2021)   Hunger Vital Sign    Worried About Running Out of Food in the Last Year: Never true    Ran Out of Food in the Last Year: Never true  Transportation Needs: No Transportation Needs  (05/10/2021)   PRAPARE - Hydrologist (Medical): No    Lack of Transportation (Non-Medical): No  Physical Activity: Sufficiently Active (05/10/2021)   Exercise Vital Sign    Days of Exercise per Week: 4 days    Minutes of Exercise per Session: 60 min  Stress: No Stress Concern Present (05/10/2021)   [place]    Feeling of Stress : Not at all  Social Connections: Moderately Integrated (05/10/2021)   Social Connection and  Isolation Panel [NHANES]    Frequency of Communication with Friends and Family: More than three times a week    Frequency of Social Gatherings with Friends and Family: Three times a week    Attends Religious Services: More than 4 times per year    Active Member of Clubs or Organizations: Yes    Attends Archivist Meetings: 1 to 4 times per year    Marital Status: Divorced    Allergies:  Allergies  Allergen Reactions   Bee Venom Anaphylaxis   Sesame Oil Anaphylaxis and Swelling    (Sesame seed)   Aspirin     Diverticulosis with bleed   Molds & Smuts    Zofran [Ondansetron] Itching    Generalized itching and had a rash on her thighs   Codeine Itching and Rash    Metabolic Disorder Labs: Lab Results  Component Value Date   HGBA1C 6.4 08/11/2021   MPG 123 04/01/2019   MPG 126 09/03/2018   No results found for: "PROLACTIN" Lab Results  Component Value Date   CHOL 148 12/24/2020   TRIG 81 12/24/2020   HDL 55 12/24/2020   CHOLHDL 2.7 12/24/2020   VLDL 15 06/30/2016   LDLCALC 77 12/24/2020   LDLCALC 78 04/01/2019   Lab Results  Component Value Date   TSH 0.906 12/24/2020   TSH 1.71 04/01/2019    Therapeutic Level Labs: No results found for: "LITHIUM" No results found for: "VALPROATE" No results found for: "CBMZ"  Current Medications: Current Outpatient Medications  Medication Sig Dispense Refill   ALPRAZolam (XANAX) 1 MG tablet Take 1 tablet (1  mg total) by mouth 3 (three) times daily. 90 tablet 3   amLODipine (NORVASC) 10 MG tablet Take 1 tablet (10 mg total) by mouth daily. 90 tablet 3   Ascorbic Acid (VITAMIN C) 500 MG CAPS Take 500 mg by mouth daily.     cetirizine (ZYRTEC) 10 MG tablet Take 1 tablet (10 mg total) by mouth daily. 5 tablet 11   Cholecalciferol (VITAMIN D3 PO) Take by mouth.     ferrous sulfate 325 (65 FE) MG tablet Take 325 mg by mouth 1 day or 1 dose. 60 tablet 3   mirabegron ER (MYRBETRIQ) 25 MG TB24 tablet TAKE 1 TABLET(25 MG) BY MOUTH DAILY 30 tablet 5   mirtazapine (REMERON) 15 MG tablet Take 1 tablet (15 mg total) by mouth at bedtime. 30 tablet 3   pantoprazole (PROTONIX) 40 MG tablet TAKE 1 TABLET(40 MG) BY MOUTH DAILY 90 tablet 1   phentermine 15 MG capsule Take 1 capsule (15 mg total) by mouth every morning. (Patient not taking: Reported on 10/12/2021) 30 capsule 1   predniSONE (DELTASONE) 20 MG tablet Take one tablet by mouth three times daly for 3 days, then one tablet two times daily for 3 days, then one tablet once daily for 3 days , then stop 18 tablet 0   promethazine-dextromethorphan (PROMETHAZINE-DM) 6.25-15 MG/5ML syrup Take one teaspoon at bedtime as needed, for excess cough 180 mL 0   spironolactone (ALDACTONE) 25 MG tablet Take 1 tablet (25 mg total) by mouth daily. 30 tablet 3   tiZANidine (ZANAFLEX) 4 MG tablet Take one tablet by mouth once daily, as needed, for muscle spasm 30 tablet 1   zolpidem (AMBIEN) 10 MG tablet TAKE 1 TABLET(10 MG) BY MOUTH AT BEDTIME AS NEEDED FOR SLEEP 30 tablet 3   No current facility-administered medications for this visit.     Musculoskeletal: Strength & Muscle Tone:  na Gait & Station: na Patient leans: N/A  Psychiatric Specialty Exam: Review of Systems  All other systems reviewed and are negative.   There were no vitals taken for this visit.There is no height or weight on file to calculate BMI.  General Appearance: NA  Eye Contact:  na  Speech:  Clear  and Coherent  Volume:  Normal  Mood:  Euthymic  Affect:  na  Thought Process:  Goal Directed  Orientation:  Full (Time, Place, and Person)  Thought Content: WDL   Suicidal Thoughts:  No  Homicidal Thoughts:  No  Memory:  Immediate;   Good Recent;   Good Remote;   Good  Judgement:  Good  Insight:  Good  Psychomotor Activity:  Normal  Concentration:  Concentration: Good and Attention Span: Good  Recall:  Good  Fund of Knowledge: Good  Language: Good  Akathisia:  No  Handed:  Right  AIMS (if indicated): not done  Assets:  Communication Skills Desire for Improvement Resilience Social Support Talents/Skills  ADL's:  Intact  Cognition: WNL  Sleep:  Good   Screenings: GAD-7    Flowsheet Row Office Visit from 12/19/2019 in [facility] Office Visit from 06/04/2019 in [facility] Follow Up from 05/25/2017 in [facility]  Total GAD-7 Score 0 15 13      PHQ2-9    Flowsheet Row Video Visit from 11/01/2021 in [facility] Office Visit from 09/09/2021 in [facility] Video Visit from 08/06/2021 in [facility] Office Visit from 06/09/2021 in [facility] from 05/10/2021 in [facility]  PHQ-2 Total Score 0 0 '1 6 2  '$ PHQ-9 Total Score -- -- -- 17 2      Flowsheet Row Video Visit from 11/01/2021 in [facility] Video Visit from 08/06/2021 in [facility] No Risk No Risk        Assessment and Plan: This patient is a 61 year old female with a history of anxiety depression and insomnia.  She is doing well on her current regimen.  She will continue mirtazapine 15 mg at bedtime for depression and sleep, Xanax 1 mg 3 times daily for anxiety and Ambien 10 mg at bedtime for sleep.  She will return to see  me in 4 months  Collaboration of Care: Collaboration of Care: Primary Care Provider AEB notes are shared with PCP on the epic system  Patient/Guardian was advised Release of Information must be obtained prior to any record release in order to collaborate their care with an outside provider. Patient/Guardian was advised if they have not already done so to contact the registration department to sign all necessary forms in order for [place] to release information regarding their care.   Consent: Patient/Guardian gives verbal consent for treatment and assignment of benefits for services provided during this visit. Patient/Guardian expressed understanding and agreed to proceed.    Levonne Spiller, MD 11/01/2021, 1:40 PM

## 2021-11-10 ENCOUNTER — Ambulatory Visit: Payer: Medicare Other | Admitting: Family Medicine

## 2021-11-10 ENCOUNTER — Other Ambulatory Visit: Payer: Self-pay

## 2021-11-10 DIAGNOSIS — K219 Gastro-esophageal reflux disease without esophagitis: Secondary | ICD-10-CM

## 2021-11-10 MED ORDER — PANTOPRAZOLE SODIUM 40 MG PO TBEC: DELAYED_RELEASE_TABLET | ORAL | 1 refills | Status: DC

## 2021-12-21 ENCOUNTER — Encounter: Payer: Self-pay | Admitting: Obstetrics & Gynecology

## 2021-12-21 ENCOUNTER — Ambulatory Visit (INDEPENDENT_AMBULATORY_CARE_PROVIDER_SITE_OTHER): Payer: Medicare Other | Admitting: Obstetrics & Gynecology

## 2021-12-21 VITALS — BP 126/89 | HR 96 | Ht 65.0 in | Wt 246.0 lb

## 2021-12-21 DIAGNOSIS — M329 Systemic lupus erythematosus, unspecified: Secondary | ICD-10-CM | POA: Diagnosis not present

## 2021-12-21 DIAGNOSIS — Z4689 Encounter for fitting and adjustment of other specified devices: Secondary | ICD-10-CM

## 2021-12-21 DIAGNOSIS — IMO0002 Reserved for concepts with insufficient information to code with codable children: Secondary | ICD-10-CM

## 2021-12-21 DIAGNOSIS — N993 Prolapse of vaginal vault after hysterectomy: Secondary | ICD-10-CM | POA: Diagnosis not present

## 2021-12-21 MED ORDER — MIRABEGRON ER 50 MG PO TB24: ORAL_TABLET | ORAL | 11 refills | Status: DC

## 2021-12-21 NOTE — Progress Notes (Signed)
Chief Complaint  Patient presents with   pessary maintenance    Blood pressure 126/89, pulse 96, height '5\' 5"'$  (1.651 m), weight 246 lb (111.6 kg).  Jenna Woods presents today for routine follow up related to her pessary.   She uses a Milex ring with support #2 She reports no vaginal discharge and no vaginal bleeding   Likert scale(1 not bothersome -5 very bothersome)  :  1  Exam reveals no undue vaginal mucosal pressure of breakdown, no discharge and no vaginal bleeding.  Vaginal Epithelial Abnormality Classification System:   0 0    No abnormalities 1    Epithelial erythema 2    Granulation tissue 3    Epithelial break or erosion, 1 cm or less 4    Epithelial break or erosion, 1 cm or greater  The pessary is removed, cleaned and will be left out for now    ICD-10-CM   1. Pessary maintenance, Milex ring with support #2  Z46.89     2. Vaginal vault prolapse after hysterectomy  C12.7        Jenna Woods will be sen back in prn months for continued follow up.  Florian Buff, MD  12/21/2021 4:09 PM

## 2021-12-22 ENCOUNTER — Other Ambulatory Visit: Payer: Self-pay

## 2021-12-22 MED ORDER — TIZANIDINE HCL 4 MG PO TABS: ORAL_TABLET | ORAL | 1 refills | Status: DC

## 2021-12-28 ENCOUNTER — Encounter: Payer: Self-pay | Admitting: Family Medicine

## 2021-12-28 ENCOUNTER — Ambulatory Visit (INDEPENDENT_AMBULATORY_CARE_PROVIDER_SITE_OTHER): Payer: Medicare Other | Admitting: Family Medicine

## 2021-12-28 VITALS — BP 108/72 | HR 95 | Ht 66.0 in | Wt 241.1 lb

## 2021-12-28 DIAGNOSIS — R7302 Impaired glucose tolerance (oral): Secondary | ICD-10-CM | POA: Diagnosis not present

## 2021-12-28 DIAGNOSIS — Z0001 Encounter for general adult medical examination with abnormal findings: Secondary | ICD-10-CM

## 2021-12-28 DIAGNOSIS — Z1231 Encounter for screening mammogram for malignant neoplasm of breast: Secondary | ICD-10-CM

## 2021-12-28 DIAGNOSIS — I10 Essential (primary) hypertension: Secondary | ICD-10-CM

## 2021-12-28 DIAGNOSIS — E559 Vitamin D deficiency, unspecified: Secondary | ICD-10-CM | POA: Diagnosis not present

## 2021-12-28 DIAGNOSIS — Z13220 Encounter for screening for lipoid disorders: Secondary | ICD-10-CM | POA: Diagnosis not present

## 2021-12-28 NOTE — Patient Instructions (Addendum)
F/U 2nd week in January, call if you need me sooner  CONGRATS on excellent change in health habits, keep them up!  Need TdAP at pharmacy    lipid, cbc, hBA1C, cmp and EGFR, TSH and vit D today  Please schedule mammogram at [facility] at checkout  It is important that you exercise regularly at least 30 minutes 5 times a week. If you develop chest pain, have severe difficulty breathing, or feel very tired, stop exercising immediately and seek medical attention   Thanks for choosing [facility], we consider it a privelige to serve you.

## 2021-12-28 NOTE — Progress Notes (Signed)
    Jenna Woods     MRN: [id number]      DOB: [birth year]  HPI: Patient is in for annual physical exam. No other health concerns are expressed or addressed at the visit. Recent labs,  are reviewed. Immunization is reviewed , and  updated if needed.   PE: BP 108/72 (BP Location: Right Arm, Patient Position: Sitting, Cuff Size: Large)   Pulse 95   Ht '5\' 6"'$  (1.676 m)   Wt 241 lb 1.3 oz (109.4 kg)   SpO2 93%   BMI 38.91 kg/m   Pleasant  female, alert and oriented x 3, in no cardio-pulmonary distress. Afebrile. HEENT No facial trauma or asymetry. Sinuses non tender.  Extra occullar muscles intact.. External ears normal, . Neck: supple, no adenopathy,JVD or thyromegaly.No bruits.  Chest: Clear to ascultation bilaterally.No crackles or wheezes. Non tender to palpation    Cardiovascular system; Heart sounds normal,  S1 and  S2 ,no S3.  No murmur, or thrill.  Abdomen: Soft, non tender, no organomegaly or masses. No bruits. Bowel sounds normal. No guarding, tenderness or rebound.   .   Musculoskeletal exam: Full ROM of spine, hips , shoulders and knees. No deformity ,swelling or crepitus noted. No muscle wasting or atrophy.   Neurologic: Cranial nerves 2 to 12 intact. Power, tone ,sensation and reflexes normal throughout. No disturbance in gait. No tremor.  Skin: Intact, no ulceration, erythema , scaling or rash noted. Pigmentation normal throughout  Psych; Normal mood and affect. Judgement and concentration normal   Assessment & Plan:  Annual visit for general adult medical examination with abnormal findings Annual exam as documented. Counseling done  re healthy lifestyle involving commitment to 150 minutes exercise per week, heart healthy diet, and attaining healthy weight.The importance of adequate sleep also discussed. Regular seat belt use and home safety, is also discussed. Changes in health habits are decided on by the patient with goals and time  frames  set for achieving them. Immunization and cancer screening needs are specifically addressed at this visit.

## 2021-12-29 LAB — CMP14+EGFR
ALT: 8 IU/L (ref 0–32)
AST: 10 IU/L (ref 0–40)
Albumin/Globulin Ratio: 1.7 (ref 1.2–2.2)
Albumin: 4.2 g/dL (ref 3.9–4.9)
Alkaline Phosphatase: 80 IU/L (ref 44–121)
BUN/Creatinine Ratio: 18 (ref 12–28)
BUN: 13 mg/dL (ref 8–27)
Bilirubin Total: 0.2 mg/dL (ref 0.0–1.2)
CO2: 21 mmol/L (ref 20–29)
Calcium: 9.8 mg/dL (ref 8.7–10.3)
Chloride: 104 mmol/L (ref 96–106)
Creatinine, Ser: 0.71 mg/dL (ref 0.57–1.00)
Globulin, Total: 2.5 g/dL (ref 1.5–4.5)
Glucose: 97 mg/dL (ref 70–99)
Potassium: 4.1 mmol/L (ref 3.5–5.2)
Sodium: 142 mmol/L (ref 134–144)
Total Protein: 6.7 g/dL (ref 6.0–8.5)
eGFR: 95 mL/min/{1.73_m2} (ref >59)

## 2021-12-29 LAB — CBC
Hematocrit: 40.4 % (ref 34.0–46.6)
Hemoglobin: 13.8 g/dL (ref 11.1–15.9)
MCH: 31 pg (ref 26.6–33.0)
MCHC: 34.2 g/dL (ref 31.5–35.7)
MCV: 91 fL (ref 79–97)
Platelets: 326 10*3/uL (ref 150–450)
RBC: 4.45 x10E6/uL (ref 3.77–5.28)
RDW: 11.8 % (ref 11.7–15.4)
WBC: 10.2 10*3/uL (ref 3.4–10.8)

## 2021-12-29 LAB — HEMOGLOBIN A1C
Est. average glucose Bld gHb Est-mCnc: 126 mg/dL
Hgb A1c MFr Bld: 6 % — ABNORMAL HIGH (ref 4.8–5.6)

## 2021-12-29 LAB — LIPID PANEL
Chol/HDL Ratio: 3.3 ratio (ref 0.0–4.4)
Cholesterol, Total: 137 mg/dL (ref 100–199)
HDL: 41 mg/dL (ref >39)
LDL Chol Calc (NIH): 79 mg/dL (ref 0–99)
Triglycerides: 92 mg/dL (ref 0–149)
VLDL Cholesterol Cal: 17 mg/dL (ref 5–40)

## 2021-12-29 LAB — VITAMIN D 25 HYDROXY (VIT D DEFICIENCY, FRACTURES): Vit D, 25-Hydroxy: 57.7 ng/mL (ref 30.0–100.0)

## 2021-12-29 LAB — TSH: TSH: 1.46 u[IU]/mL (ref 0.450–4.500)

## 2022-01-02 ENCOUNTER — Encounter: Payer: Self-pay | Admitting: Family Medicine

## 2022-01-02 NOTE — Assessment & Plan Note (Signed)

## 2022-01-05 ENCOUNTER — Other Ambulatory Visit: Payer: Self-pay

## 2022-01-05 MED ORDER — SPIRONOLACTONE 25 MG PO TABS: 25.0000 mg | ORAL_TABLET | Freq: Every day | ORAL | 3 refills | Status: DC

## 2022-01-12 ENCOUNTER — Other Ambulatory Visit: Payer: Self-pay | Admitting: Family Medicine

## 2022-01-12 NOTE — Telephone Encounter (Signed)
Pt called wanting to know if she can be refilled? She for got to mention at her appt.

## 2022-01-15 ENCOUNTER — Encounter (INDEPENDENT_AMBULATORY_CARE_PROVIDER_SITE_OTHER): Payer: Self-pay | Admitting: Gastroenterology

## 2022-01-24 ENCOUNTER — Other Ambulatory Visit: Payer: Self-pay | Admitting: Family Medicine

## 2022-02-02 ENCOUNTER — Other Ambulatory Visit (HOSPITAL_COMMUNITY): Payer: Self-pay | Admitting: Psychiatry

## 2022-02-02 MED ORDER — MIRTAZAPINE 15 MG PO TABS: 15.0000 mg | ORAL_TABLET | Freq: Every day | ORAL | 3 refills | Status: DC

## 2022-02-23 ENCOUNTER — Telehealth (HOSPITAL_COMMUNITY): Payer: Self-pay | Admitting: *Deleted

## 2022-02-23 ENCOUNTER — Other Ambulatory Visit: Payer: Self-pay | Admitting: Family Medicine

## 2022-02-23 MED ORDER — ALPRAZOLAM 1 MG PO TABS: 1.0000 mg | ORAL_TABLET | Freq: Three times a day (TID) | ORAL | 0 refills | Status: DC

## 2022-02-23 NOTE — Addendum Note (Signed)
Addended by: Berniece Andreas T on: 02/23/2022 12:16 PM   Modules accepted: Orders

## 2022-02-23 NOTE — Telephone Encounter (Signed)
Patient called stating she is going to be out of her medication (Xanax) on the 23rd of December. Patient have follow up with Dr. Harrington Challenger on 03/01/2022. Would like it [facility] if possible. Patient informed Dr. Harrington Challenger is out of the office and she verbalized understanding.

## 2022-02-23 NOTE — Telephone Encounter (Signed)
I sent 20 tablets of Xanax bridge supply until Dr. Harrington Challenger return to work.

## 2022-03-02 ENCOUNTER — Telehealth (INDEPENDENT_AMBULATORY_CARE_PROVIDER_SITE_OTHER): Payer: Medicare Other | Admitting: Psychiatry

## 2022-03-02 ENCOUNTER — Encounter (HOSPITAL_COMMUNITY): Payer: Self-pay | Admitting: Psychiatry

## 2022-03-02 DIAGNOSIS — F5105 Insomnia due to other mental disorder: Secondary | ICD-10-CM | POA: Diagnosis not present

## 2022-03-02 DIAGNOSIS — F411 Generalized anxiety disorder: Secondary | ICD-10-CM | POA: Diagnosis not present

## 2022-03-02 DIAGNOSIS — F331 Major depressive disorder, recurrent, moderate: Secondary | ICD-10-CM | POA: Diagnosis not present

## 2022-03-02 MED ORDER — ALPRAZOLAM 1 MG PO TABS: 1.0000 mg | ORAL_TABLET | Freq: Three times a day (TID) | ORAL | 3 refills | Status: DC

## 2022-03-02 MED ORDER — MIRTAZAPINE 15 MG PO TABS: 15.0000 mg | ORAL_TABLET | Freq: Every day | ORAL | 3 refills | Status: DC

## 2022-03-02 NOTE — Progress Notes (Signed)
Virtual Visit via Telephone Note  I connected with Jenna Woods on [birth year] at 11:40 AM EST by telephone and verified that I am speaking with the correct person using two identifiers.  Location: Patient: home Provider: office   I discussed the limitations, risks, security and privacy concerns of performing an evaluation and management service by telephone and the availability of in person appointments. I also discussed with the patient that there may be a patient responsible charge related to this service. The patient expressed understanding and agreed to proceed.      I discussed the assessment and treatment plan with the patient. The patient was provided an opportunity to ask questions and all were answered. The patient agreed with the plan and demonstrated an understanding of the instructions.   The patient was advised to call back or seek an in-person evaluation if the symptoms worsen or if the condition fails to improve as anticipated.  I provided 15 minutes of non-face-to-face time during this encounter.   Levonne Spiller, MD  [facility] MD/PA/NP OP Progress Note  96/29/5284 13:24 AM Jenna Woods  MRN:  [id number]  Chief Complaint:  Chief Complaint  Patient presents with   Anxiety   Follow-up   HPI: Patient is a 61 year old divorced white female who lives alone in [place].  She is on disability.  The patient returns for follow-up after 4 months regarding her depression and anxiety.  She states that the landlord that was "hateful" towards the tenants has been fired and they have a new landlord.  This has brought her a lot of peace and comfort.  She is not feeling much happier and less stressed.  She is no longer needing the Ambien for sleep.  She denies significant anxiety depression or thoughts of self-harm or suicide.  In fact she "feels great." Visit Diagnosis:    ICD-10-CM   1. Anxiety state  F41.1     2. Major depressive disorder, recurrent episode, moderate (HCC)   F33.1     3. Insomnia due to mental disorder  F51.05       Past Psychiatric History: none  Past Medical History:  Past Medical History:  Diagnosis Date   Allergic reaction    Allergy    perrenial    Anxiety    Arthritis    Depression    h/o suicidal ideation in 2011   Depression with anxiety    Diverticulosis of colon 12/19/2015   Diverticulosis of colon with hemorrhage 12/19/2015   GERD (gastroesophageal reflux disease)    Hypertension 1996   Obesity    Peripheral vascular disease ([place])    Prediabetes 2013   Raynauds syndrome    Scleroderma ([place]) 1998   Systemic lupus erythematosus ([place]) 1998   [facility] at [facility]   Tobacco abuse, in remission    10-pack-years discontinued in 2007   Urinary frequency     Past Surgical History:  Procedure Laterality Date   ABDOMINAL HYSTERECTOMY  1990   Neoplasm   CESAREAN SECTION     x2   COLONOSCOPY  2005   COLONOSCOPY WITH PROPOFOL N/A 08/28/2015   Dr. Laural Golden: scattered medium-mouth diverticula entire colon, external hemorrhoids   COLONOSCOPY WITH PROPOFOL N/A 07/27/2016   Procedure: COLONOSCOPY WITH PROPOFOL;  Surgeon: Rogene Houston, MD;  Location: [facility];  Service: Endoscopy;  Laterality: N/A;   FINGER AMPUTATION     Third finger, bilateral, distal   INCISIONAL HERNIA REPAIR N/A 01/09/2019   Procedure: HERNIA REPAIR INCISIONAL;  Surgeon:  Virl Cagey, MD;  Location: [facility] ORS;  Service: General;  Laterality: N/A;   INSERTION OF MESH N/A 01/09/2019   Procedure: INSERTION OF MESH;  Surgeon: Virl Cagey, MD;  Location: [facility] OR[facility];  Service: General;  Laterality: N/A;   NASAL SINUS SURGERY  09/06/2011   Procedure: ENDOSCOPIC SINUS SURGERY;  Surgeon: Ascencion Dike, MD;  Location: [facility];  Service: ENT;  Laterality: N/A;  Nasal cerebrospinal fluid leak repair with Left temporalis fascia graft and Left Ear cartilage graft   VASCULAR SURGERY     Hands, bilaterally, [facility]; right hand partial  amputation of middle finger.    Family Psychiatric History: See below  Family History:  Family History  Problem Relation Age of Onset   Arthritis Mother        Rheumatoid   Dementia Mother    Hypertension Mother    Cirrhosis Father    Alcohol abuse Father    Arthritis Maternal Grandmother    Drug abuse Sister    Scleroderma Brother    Berenice Primas' disease Son    Colon cancer Neg Hx     Social History:  Social History   Socioeconomic History   Marital status: Single    Spouse name: Not on file   Number of children: 2   Years of education: 12   Highest education level: 12th grade  Occupational History   Occupation: Art therapist: [employer]    Comment: Disability awarded in 1998  Tobacco Use   Smoking status: Former    Packs/day: 0.25    Years: 25.00    Total pack years: 6.25    Types: Cigarettes    Quit date: 04/07/2001    Years since quitting: 20.9   Smokeless tobacco: Never   Tobacco comments:    quit 88 days ago!!  Vaping Use   Vaping Use: Never used  Substance and Sexual Activity   Alcohol use: No    Alcohol/week: 0.0 standard drinks of alcohol    Comment: quit in 2004   Drug use: No    Comment: use to use pot    Sexual activity: Not Currently    Birth control/protection: Surgical    Comment: hyst  Other Topics Concern   Not on file  Social History Narrative   Currently living with son in [place], moving back soon    Social Determinants of Health   Financial Resource Strain: Low Risk  (05/10/2021)   Overall Financial Resource Strain (CARDIA)    Difficulty of Paying Living Expenses: Not very hard  Food Insecurity: No Food Insecurity (05/10/2021)   Hunger Vital Sign    Worried About Running Out of Food in the Last Year: Never true    Ran Out of Food in the Last Year: Never true  Transportation Needs: No Transportation Needs (05/10/2021)   PRAPARE - Hydrologist (Medical): No    Lack of Transportation  (Non-Medical): No  Physical Activity: Sufficiently Active (05/10/2021)   Exercise Vital Sign    Days of Exercise per Week: 4 days    Minutes of Exercise per Session: 60 min  Stress: No Stress Concern Present (05/10/2021)   [place]    Feeling of Stress : Not at all  Social Connections: Moderately Integrated (05/10/2021)   Social Connection and Isolation Panel [NHANES]    Frequency of Communication with Friends and Family: More than three times a  week    Frequency of Social Gatherings with Friends and Family: Three times a week    Attends Religious Services: More than 4 times per year    Active Member of Clubs or Organizations: Yes    Attends Archivist Meetings: 1 to 4 times per year    Marital Status: Divorced    Allergies:  Allergies  Allergen Reactions   Bee Venom Anaphylaxis   Sesame Oil Anaphylaxis and Swelling    (Sesame seed)   Aspirin     Diverticulosis with bleed   Molds & Smuts    Zofran [Ondansetron] Itching    Generalized itching and had a rash on her thighs   Codeine Itching and Rash    Metabolic Disorder Labs: Lab Results  Component Value Date   HGBA1C 6.0 (H) 12/28/2021   MPG 123 04/01/2019   MPG 126 09/03/2018   No results found for: "PROLACTIN" Lab Results  Component Value Date   CHOL 137 12/28/2021   TRIG 92 12/28/2021   HDL 41 12/28/2021   CHOLHDL 3.3 12/28/2021   VLDL 15 06/30/2016   LDLCALC 79 12/28/2021   LDLCALC 77 12/24/2020   Lab Results  Component Value Date   TSH 1.460 12/28/2021   TSH 0.906 12/24/2020    Therapeutic Level Labs: No results found for: "LITHIUM" No results found for: "VALPROATE" No results found for: "CBMZ"  Current Medications: Current Outpatient Medications  Medication Sig Dispense Refill   ALPRAZolam (XANAX) 1 MG tablet Take 1 tablet (1 mg total) by mouth 3 (three) times daily. 90 tablet 3   amLODipine (NORVASC) 10 MG tablet Take 1  tablet (10 mg total) by mouth daily. 90 tablet 3   Ascorbic Acid (VITAMIN C) 500 MG CAPS Take 500 mg by mouth daily.     cetirizine (ZYRTEC) 10 MG tablet Take 1 tablet (10 mg total) by mouth daily. 5 tablet 11   Cholecalciferol (VITAMIN D3 PO) Take by mouth.     EPINEPHRINE 0.3 mg/0.3 mL IJ SOAJ injection INJECT THE CONTENTS OF 1 SYRINGE AT FIRST SIGN OF SEVERE ALLERGIC REACTION INCLUDING ANAPHYLAXIS. MAY REPEAT IF NECESSARY. CALL 911. 2 each PRN   ferrous sulfate 325 (65 FE) MG tablet Take 325 mg by mouth 1 day or 1 dose. 60 tablet 3   mirabegron ER (MYRBETRIQ) 50 MG TB24 tablet Take 1 tablet daily 30 tablet 11   mirtazapine (REMERON) 15 MG tablet Take 1 tablet (15 mg total) by mouth at bedtime. 30 tablet 3   pantoprazole (PROTONIX) 40 MG tablet TAKE 1 TABLET(40 MG) BY MOUTH DAILY 90 tablet 1   phentermine 15 MG capsule Take 1 capsule (15 mg total) by mouth every morning. 30 capsule 1   spironolactone (ALDACTONE) 25 MG tablet Take 1 Tablet by mouth once daily 30 tablet 3   tiZANidine (ZANAFLEX) 4 MG tablet Take one tablet by mouth once daily, as needed, for muscle spasm 30 tablet 1   No current facility-administered medications for this visit.     Musculoskeletal: Strength & Muscle Tone: na Gait & Station: na Patient leans: N/A  Psychiatric Specialty Exam: Review of Systems  All other systems reviewed and are negative.   There were no vitals taken for this visit.There is no height or weight on file to calculate BMI.  General Appearance: NA  Eye Contact:  NA  Speech:  Clear and Coherent  Volume:  Normal  Mood:  Euthymic  Affect:  NA  Thought Process:  Goal Directed  Orientation:  Full (Time, Place, and Person)  Thought Content: WDL   Suicidal Thoughts:  No  Homicidal Thoughts:  No  Memory:  Immediate;   Good Recent;   Good Remote;   Fair  Judgement:  Good  Insight:  Fair  Psychomotor Activity:  Normal  Concentration:  Concentration: Good and Attention Span: Good  Recall:   Good  Fund of Knowledge: Good  Language: Good  Akathisia:  No  Handed:  Right  AIMS (if indicated): not done  Assets:  Communication Skills Desire for Improvement Resilience Social Support Talents/Skills  ADL's:  Intact  Cognition: WNL  Sleep:  Good   Screenings: GAD-7    Flowsheet Row Office Visit from 12/19/2019 in [facility] Office Visit from 06/04/2019 in [facility] Phone Follow Up from 05/25/2017 in [facility]  Total GAD-7 Score 0 15 13      PHQ2-9    [facility] Office Visit from 12/28/2021 in [facility] Video Visit from 11/01/2021 in [facility] Office Visit from 09/09/2021 in [facility] Video Visit from 08/06/2021 in [facility] Office Visit from 06/09/2021 in [facility]  PHQ-2 Total Score 1 0 0 1 6  PHQ-9 Total Score -- -- -- -- 17      Flowsheet Row Video Visit from 11/01/2021 in [facility] Video Visit from 08/06/2021 in [facility] No Risk No Risk        Assessment and Plan: This patient is a 61 year old female with a history of anxiety depression and insomnia.  She continues to do well on her current regimen.  She will continue mirtazapine 15 mg at bedtime for depression and sleep and Xanax 1 mg 3 times daily for anxiety.  She has stopped the Ambien.  She will return to see me in 4 months  Collaboration of Care: Collaboration of Care: Primary Care Provider AEB notes are shared with PCP on the epic system  Patient/Guardian was advised Release of Information must be obtained prior to any record release in order to collaborate their care with an outside provider. Patient/Guardian was advised if they have not already done so to contact the registration department to sign all necessary  forms in order for [place] to release information regarding their care.   Consent: Patient/Guardian gives verbal consent for treatment and assignment of benefits for services provided during this visit. Patient/Guardian expressed understanding and agreed to proceed.    Levonne Spiller, MD 03/02/2022, 11:51 AM

## 2022-03-23 ENCOUNTER — Other Ambulatory Visit: Payer: Self-pay | Admitting: Family Medicine

## 2022-04-01 ENCOUNTER — Encounter: Payer: Self-pay | Admitting: Family Medicine

## 2022-04-01 ENCOUNTER — Emergency Department (HOSPITAL_COMMUNITY): Payer: 59

## 2022-04-01 ENCOUNTER — Ambulatory Visit (INDEPENDENT_AMBULATORY_CARE_PROVIDER_SITE_OTHER): Payer: 59 | Admitting: Family Medicine

## 2022-04-01 ENCOUNTER — Emergency Department (HOSPITAL_COMMUNITY)
Admission: EM | Admit: 2022-04-01 | Discharge: 2022-04-02 | Disposition: A | Discharge status: Home / Self Care | Payer: 59 | Attending: Emergency Medicine | Admitting: Emergency Medicine

## 2022-04-01 VITALS — BP 108/78 | HR 93 | Ht 66.0 in | Wt 242.1 lb

## 2022-04-01 DIAGNOSIS — R350 Frequency of micturition: Secondary | ICD-10-CM | POA: Diagnosis not present

## 2022-04-01 DIAGNOSIS — R0781 Pleurodynia: Secondary | ICD-10-CM | POA: Diagnosis not present

## 2022-04-01 DIAGNOSIS — S22029A Unspecified fracture of second thoracic vertebra, initial encounter for closed fracture: Secondary | ICD-10-CM | POA: Diagnosis not present

## 2022-04-01 DIAGNOSIS — J302 Other seasonal allergic rhinitis: Secondary | ICD-10-CM | POA: Diagnosis not present

## 2022-04-01 DIAGNOSIS — K219 Gastro-esophageal reflux disease without esophagitis: Secondary | ICD-10-CM | POA: Diagnosis not present

## 2022-04-01 DIAGNOSIS — R1012 Left upper quadrant pain: Secondary | ICD-10-CM | POA: Diagnosis not present

## 2022-04-01 DIAGNOSIS — F322 Major depressive disorder, single episode, severe without psychotic features: Secondary | ICD-10-CM

## 2022-04-01 DIAGNOSIS — I10 Essential (primary) hypertension: Secondary | ICD-10-CM | POA: Diagnosis not present

## 2022-04-01 DIAGNOSIS — E041 Nontoxic single thyroid nodule: Secondary | ICD-10-CM | POA: Diagnosis not present

## 2022-04-01 DIAGNOSIS — N39498 Other specified urinary incontinence: Secondary | ICD-10-CM

## 2022-04-01 DIAGNOSIS — R7302 Impaired glucose tolerance (oral): Secondary | ICD-10-CM

## 2022-04-01 DIAGNOSIS — S299XXA Unspecified injury of thorax, initial encounter: Secondary | ICD-10-CM | POA: Diagnosis present

## 2022-04-01 DIAGNOSIS — S3991XA Unspecified injury of abdomen, initial encounter: Secondary | ICD-10-CM | POA: Diagnosis not present

## 2022-04-01 DIAGNOSIS — Y92410 Unspecified street and highway as the place of occurrence of the external cause: Secondary | ICD-10-CM | POA: Diagnosis not present

## 2022-04-01 DIAGNOSIS — R911 Solitary pulmonary nodule: Secondary | ICD-10-CM | POA: Diagnosis not present

## 2022-04-01 DIAGNOSIS — M25512 Pain in left shoulder: Secondary | ICD-10-CM | POA: Diagnosis not present

## 2022-04-01 DIAGNOSIS — Z041 Encounter for examination and observation following transport accident: Secondary | ICD-10-CM | POA: Diagnosis not present

## 2022-04-01 DIAGNOSIS — S3993XA Unspecified injury of pelvis, initial encounter: Secondary | ICD-10-CM | POA: Diagnosis not present

## 2022-04-01 LAB — CBC
HCT: 43.4 % (ref 36.0–46.0)
Hemoglobin: 14.4 g/dL (ref 12.0–15.0)
MCH: 30.7 pg (ref 26.0–34.0)
MCHC: 33.2 g/dL (ref 30.0–36.0)
MCV: 92.5 fL (ref 80.0–100.0)
Platelets: 349 10*3/uL (ref 150–400)
RBC: 4.69 MIL/uL (ref 3.87–5.11)
RDW: 13.2 % (ref 11.5–15.5)
WBC: 11.6 10*3/uL — ABNORMAL HIGH (ref 4.0–10.5)
nRBC: 0 % (ref 0.0–0.2)

## 2022-04-01 LAB — COMPREHENSIVE METABOLIC PANEL
ALT: 13 U/L (ref 0–44)
AST: 18 U/L (ref 15–41)
Albumin: 3.9 g/dL (ref 3.5–5.0)
Alkaline Phosphatase: 67 U/L (ref 38–126)
Anion gap: 10 (ref 5–15)
BUN: 11 mg/dL (ref 8–23)
CO2: 23 mmol/L (ref 22–32)
Calcium: 9.6 mg/dL (ref 8.9–10.3)
Chloride: 104 mmol/L (ref 98–111)
Creatinine, Ser: 0.86 mg/dL (ref 0.44–1.00)
GFR, Estimated: >60 mL/min (ref >60)
Glucose, Bld: 96 mg/dL (ref 70–99)
Potassium: 3.8 mmol/L (ref 3.5–5.1)
Sodium: 137 mmol/L (ref 135–145)
Total Bilirubin: 0.6 mg/dL (ref 0.3–1.2)
Total Protein: 7 g/dL (ref 6.5–8.1)

## 2022-04-01 MED ORDER — KETOROLAC TROMETHAMINE 30 MG/ML IJ SOLN
15.0000 mg | Freq: Once | INTRAMUSCULAR | Status: AC
  Administered 2022-04-02: 15 mg via INTRAVENOUS
  Filled 2022-04-01: qty 1

## 2022-04-01 MED ORDER — IOHEXOL 350 MG/ML SOLN
75.0000 mL | Freq: Once | INTRAVENOUS | Status: AC | PRN
  Administered 2022-04-01: 75 mL via INTRAVENOUS

## 2022-04-01 MED ORDER — METHOCARBAMOL 500 MG PO TABS: 500.0000 mg | ORAL_TABLET | Freq: Two times a day (BID) | ORAL | 0 refills | Status: DC

## 2022-04-01 MED ORDER — PHENTERMINE HCL 37.5 MG PO TABS: 37.5000 mg | ORAL_TABLET | Freq: Every day | ORAL | 2 refills | Status: DC

## 2022-04-01 NOTE — Patient Instructions (Signed)
.   F/U in 3 months, call if you need me sooner    Please schedule mammogram at breast center at [place] higher dose of phentermine is prescribed  Blood pressure and recent labs are good  It is important that you exercise regularly at least 30 minutes 5 times a week. If you develop chest pain, have severe difficulty breathing, or feel very tired, stop exercising immediately and seek medical attention    Thanks for choosing [facility], we consider it a privelige to serve you.
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
## 2022-04-02 ENCOUNTER — Other Ambulatory Visit: Payer: Self-pay

## 2022-04-02 ENCOUNTER — Encounter (HOSPITAL_COMMUNITY): Payer: Self-pay

## 2022-04-02 DIAGNOSIS — S22029A Unspecified fracture of second thoracic vertebra, initial encounter for closed fracture: Secondary | ICD-10-CM | POA: Diagnosis not present

## 2022-04-04 ENCOUNTER — Encounter: Payer: Self-pay | Admitting: Family Medicine

## 2022-04-04 NOTE — Assessment & Plan Note (Signed)
Well controlled on current regime

## 2022-04-04 NOTE — Assessment & Plan Note (Signed)
Controlled, no change in medication DASH diet and commitment to daily physical activity for a minimum of 30 minutes discussed and encouraged, as a part of hypertension management. The importance of attaining a healthy weight is also discussed.     04/02/2022   12:00 AM 04/01/2022   11:30 PM 04/01/2022   10:36 PM 04/01/2022    5:31 PM 04/01/2022    9:55 AM 12/28/2021    1:08 PM 12/21/2021    3:32 PM  BP/Weight  Systolic BP  840 98 335 331 740 992  Diastolic BP  86 75 94 78 72 89  Wt. (Lbs) 242.07    242.08 241.08 246  BMI 39.07 kg/m2    39.07 kg/m2 38.91 kg/m2 40.94 kg/m2

## 2022-04-04 NOTE — Progress Notes (Signed)
COTY STUDENT     MRN: [id number]      DOB: May 31, 1960   HPI Jenna Woods is here for follow up and re-evaluation of chronic medical conditions, medication management and review of any available recent lab and radiology data.  Preventive health is updated, specifically  Cancer screening and Immunization.   Questions or concerns regarding consultations or procedures which the PT has had in the interim are  addressed. Doing extremly well now that her landlord has been changed in past 2 months The PT denies any adverse reactions to current medications since the last visit.  There are no new concerns.  There are no specific complaints   ROS Denies recent fever or chills. Denies sinus pressure, nasal congestion, ear pain or sore throat. Denies chest congestion, productive cough or wheezing. Denies chest pains, palpitations and leg swelling Denies abdominal pain, nausea, vomiting,diarrhea or constipation.   Denies dysuria, frequency, hesitancy or incontinence. Denies joint pain, swelling and limitation in mobility. Denies headaches, seizures, numbness, or tingling. Denies depression, anxiety or insomnia. Denies skin break down or rash.   PE  BP 108/78 (BP Location: Left Arm, Patient Position: Sitting, Cuff Size: Large)   Pulse 93   Ht '5\' 6"'$  (1.676 m)   Wt 242 lb 1.3 oz (109.8 kg)   SpO2 93%   BMI 39.07 kg/m   Patient alert and oriented and in no cardiopulmonary distress.  HEENT: No facial asymmetry, EOMI,     Neck supple .  Chest: Clear to auscultation bilaterally.  CVS: S1, S2 no murmurs, no S3.Regular rate.  ABD: Soft non tender.   Ext: No edema  MS: Adequate ROM spine, shoulders, hips and knees.  Skin: Intact, no ulcerations or rash noted.  Psych: Good eye contact, normal affect. Memory intact not anxious or depressed appearing.  CNS: CN 2-12 intact, power,  normal throughout.no focal deficits noted.   Assessment & Plan  Essential  hypertension Controlled, no change in medication DASH diet and commitment to daily physical activity for a minimum of 30 minutes discussed and encouraged, as a part of hypertension management. The importance of attaining a healthy weight is also discussed.     04/02/2022   12:00 AM 04/01/2022   11:30 PM 04/01/2022   10:36 PM 04/01/2022    5:31 PM 04/01/2022    9:55 AM 12/28/2021    1:08 PM 12/21/2021    3:32 PM  BP/Weight  Systolic BP  419 98 379 024 097 353  Diastolic BP  86 75 94 78 72 89  Wt. (Lbs) 242.07    242.08 241.08 246  BMI 39.07 kg/m2    39.07 kg/m2 38.91 kg/m2 40.94 kg/m2       IGT (impaired glucose tolerance) Patient educated about the importance of limiting  Carbohydrate intake , the need to commit to daily physical activity for a minimum of 30 minutes , and to commit weight loss. The fact that changes in all these areas will reduce or eliminate all together the development of diabetes is stressed.      Latest Ref Rng & Units 04/01/2022    6:02 PM 12/28/2021    1:55 PM 08/11/2021   12:00 AM 06/09/2021    2:41 PM 12/24/2020   11:43 AM  Diabetic Labs  HbA1c 4.8 - 5.6 %  6.0  6.4     6.0  5.8   Chol 100 - 199 mg/dL  137    148   HDL >39 mg/dL  41  55   Calc LDL 0 - 99 mg/dL  79    77   Triglycerides 0 - 149 mg/dL  92    81   Creatinine 0.44 - 1.00 mg/dL 0.86  0.71   0.81  0.69      This result is from an external source.      04/02/2022   12:00 AM 04/01/2022   11:30 PM 04/01/2022   10:36 PM 04/01/2022    5:31 PM 04/01/2022    9:55 AM 12/28/2021    1:08 PM 12/21/2021    3:32 PM  BP/Weight  Systolic BP  111 98 735 670 141 030  Diastolic BP  86 75 94 78 72 89  Wt. (Lbs) 242.07    242.08 241.08 246  BMI 39.07 kg/m2    39.07 kg/m2 38.91 kg/m2 40.94 kg/m2       No data to display         Updated lab needed at/ before next visit.   Seasonal allergies Controlled, no change in medication   Urinary frequency Well controlled on current regime  Urinary  incontinence Controlled, no change in medication   Major depression Managed by psych and great improvement since past 6 months  Morbid obesity (HCC) Unchanged inc dose of phentermine  Patient re-educated about  the importance of commitment to a  minimum of 150 minutes of exercise per week as able.  The importance of healthy food choices with portion control discussed, as well as eating regularly and within a 12 hour window most days. The need to choose "clean , green" food 50 to 75% of the time is discussed, as well as to make water the primary drink and set a goal of 64 ounces water daily.       04/02/2022   12:00 AM 04/01/2022    9:55 AM 12/28/2021    1:08 PM  Weight /BMI  Weight 242 lb 1 oz 242 lb 1.3 oz 241 lb 1.3 oz  Height '5\' 6"'$  (1.676 m) '5\' 6"'$  (1.676 m) '5\' 6"'$  (1.676 m)  BMI 39.07 kg/m2 39.07 kg/m2 38.91 kg/m2      GERD (gastroesophageal reflux disease) Controlled, no change in medication

## 2022-04-04 NOTE — Assessment & Plan Note (Signed)
Managed by psych and great improvement since past 6 months

## 2022-04-04 NOTE — Assessment & Plan Note (Signed)
Controlled, no change in medication  
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
## 2022-04-04 NOTE — Assessment & Plan Note (Signed)
Patient educated about the importance of limiting  Carbohydrate intake , the need to commit to daily physical activity for a minimum of 30 minutes , and to commit weight loss. The fact that changes in all these areas will reduce or eliminate all together the development of diabetes is stressed.      Latest Ref Rng & Units 04/01/2022    6:02 PM 12/28/2021    1:55 PM 08/11/2021   12:00 AM 06/09/2021    2:41 PM 12/24/2020   11:43 AM  Diabetic Labs  HbA1c 4.8 - 5.6 %  6.0  6.4     6.0  5.8   Chol 100 - 199 mg/dL  137    148   HDL >39 mg/dL  41    55   Calc LDL 0 - 99 mg/dL  79    77   Triglycerides 0 - 149 mg/dL  92    81   Creatinine 0.44 - 1.00 mg/dL 0.86  0.71   0.81  0.69      This result is from an external source.      04/02/2022   12:00 AM 04/01/2022   11:30 PM 04/01/2022   10:36 PM 04/01/2022    5:31 PM 04/01/2022    9:55 AM 12/28/2021    1:08 PM 12/21/2021    3:32 PM  BP/Weight  Systolic BP  384 98 665 993 570 177  Diastolic BP  86 75 94 78 72 89  Wt. (Lbs) 242.07    242.08 241.08 246  BMI 39.07 kg/m2    39.07 kg/m2 38.91 kg/m2 40.94 kg/m2       No data to display         Updated lab needed at/ before next visit.

## 2022-04-07 ENCOUNTER — Telehealth: Payer: Self-pay | Admitting: *Deleted
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
## 2022-04-08 ENCOUNTER — Telehealth: Payer: Self-pay
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
## 2022-04-11 ENCOUNTER — Telehealth: Payer: Self-pay

## 2022-04-12 ENCOUNTER — Other Ambulatory Visit: Payer: Self-pay | Admitting: Family Medicine

## 2022-04-12 DIAGNOSIS — K219 Gastro-esophageal reflux disease without esophagitis: Secondary | ICD-10-CM

## 2022-04-13 ENCOUNTER — Telehealth (INDEPENDENT_AMBULATORY_CARE_PROVIDER_SITE_OTHER): Payer: 59 | Admitting: Family Medicine

## 2022-04-13 ENCOUNTER — Encounter: Payer: Self-pay | Admitting: Family Medicine

## 2022-04-13 DIAGNOSIS — E041 Nontoxic single thyroid nodule: Secondary | ICD-10-CM

## 2022-04-13 DIAGNOSIS — R918 Other nonspecific abnormal finding of lung field: Secondary | ICD-10-CM | POA: Diagnosis not present

## 2022-04-13 DIAGNOSIS — Z09 Encounter for follow-up examination after completed treatment for conditions other than malignant neoplasm: Secondary | ICD-10-CM | POA: Diagnosis not present

## 2022-04-13 DIAGNOSIS — F418 Other specified anxiety disorders: Secondary | ICD-10-CM
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
## 2022-04-17 DIAGNOSIS — Z09 Encounter for follow-up examination after completed treatment for conditions other than malignant neoplasm: Secondary | ICD-10-CM | POA: Insufficient documentation

## 2022-04-17 DIAGNOSIS — E041 Nontoxic single thyroid nodule: Secondary | ICD-10-CM | POA: Insufficient documentation

## 2022-04-17 DIAGNOSIS — R918 Other nonspecific abnormal finding of lung field: Secondary | ICD-10-CM | POA: Insufficient documentation
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
## 2022-04-17 NOTE — Assessment & Plan Note (Signed)
Needs dedicated US, will order

## 2022-04-17 NOTE — Assessment & Plan Note (Signed)
Patient visit  for f/u of recent ED visit Discharge summary, and laboratory and radiology data are reviewed, and any questions or concerns  are discussed. Specific issues requiring follow up are specifically addressed.
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
## 2022-04-29 ENCOUNTER — Ambulatory Visit (HOSPITAL_COMMUNITY)
Admission: RE | Admit: 2022-04-29 | Discharge: 2022-04-29 | Disposition: A | Discharge status: Home / Self Care | Payer: 59 | Source: Ambulatory Visit | Attending: Family Medicine | Admitting: Family Medicine

## 2022-04-29 DIAGNOSIS — E042 Nontoxic multinodular goiter: Secondary | ICD-10-CM | POA: Diagnosis not present

## 2022-04-29 DIAGNOSIS — E041 Nontoxic single thyroid nodule: Secondary | ICD-10-CM | POA: Insufficient documentation

## 2022-05-02 ENCOUNTER — Other Ambulatory Visit: Payer: Self-pay | Admitting: Family Medicine

## 2022-05-04 ENCOUNTER — Other Ambulatory Visit: Payer: Self-pay

## 2022-05-04 ENCOUNTER — Telehealth: Payer: Self-pay | Admitting: Family Medicine

## 2022-05-04 MED ORDER — TIZANIDINE HCL 4 MG PO TABS: ORAL_TABLET | ORAL | 1 refills | Status: DC

## 2022-05-04 NOTE — Telephone Encounter (Signed)
Refill sent.

## 2022-05-04 NOTE — Telephone Encounter (Signed)
Patient called in requesting refill on   tiZANidine (ZANAFLEX) 4 MG tablet [id number]

## 2022-05-16 ENCOUNTER — Ambulatory Visit (INDEPENDENT_AMBULATORY_CARE_PROVIDER_SITE_OTHER): Payer: 59

## 2022-05-16 DIAGNOSIS — Z0000 Encounter for general adult medical examination without abnormal findings: Secondary | ICD-10-CM

## 2022-05-16 NOTE — Patient Instructions (Signed)
  Jenna Woods , Thank you for taking time to come for your Medicare Wellness Visit. I appreciate your ongoing commitment to your health goals. Please review the following plan we discussed and let me know if I can assist you in the future.   These are the goals we discussed:  Goals      Increase physical activity     Patient would like to increase her exercise to 5 days a week.      Patient Stated     Lose 25-30 lbs      Quit smoking / using tobacco        This is a list of the screening recommended for you and due dates:  Health Maintenance  Topic Date Due   COVID-19 Vaccine (7 - 2023-24 season) 02/01/2022   Mammogram  04/27/2023   Medicare Annual Wellness Visit  05/16/2023   Colon Cancer Screening  01/09/2031   DTaP/Tdap/Td vaccine (3 - Td or Tdap) 03/11/2032   Flu Shot  Completed   Hepatitis C Screening: USPSTF Recommendation to screen - Ages 18-79 yo.  Completed   HIV Screening  Completed   Zoster (Shingles) Vaccine  Completed   HPV Vaccine  Aged Out   Pap Smear  Discontinued

## 2022-05-16 NOTE — Progress Notes (Signed)
Subjective:   Jenna Woods is a 62 y.o. female who presents for Medicare Annual (Subsequent) preventive examination.  Review of Systems    I connected with  YASAMIN IFILL on [id number] by a audio enabled telemedicine application and verified that I am speaking with the correct person using two identifiers.  Patient Location: Home  Provider Location: Office/Clinic  I discussed the limitations of evaluation and management by telemedicine. The patient expressed understanding and agreed to proceed.        Objective:    There were no vitals filed for this visit. There is no height or weight on file to calculate BMI.     05/10/2021   10:34 AM 05/07/2020    1:19 PM 01/04/2019    3:02 PM 04/23/2018    4:18 PM 04/17/2017    3:25 PM 03/30/2017    7:49 PM 07/27/2016    2:51 PM  Advanced Directives  Does Patient Have a Medical Advance Directive? Yes Yes No No Yes No Yes  Type of Advance Directive Living will;Healthcare Power of [place];Living will       Does patient want to make changes to medical advance directive? No - Patient declined    No - Patient declined    Copy of [place] in Chart? No - copy requested No - copy requested       Would patient like information on creating a medical advance directive?   Yes (MAU/Ambulatory/Procedural Areas - Information given) Yes (MAU/Ambulatory/Procedural Areas - Information given)  No - Patient declined     Current Medications (verified) Outpatient Encounter Medications as of 05/16/2022  Medication Sig   ALPRAZolam (XANAX) 1 MG tablet Take 1 tablet (1 mg total) by mouth 3 (three) times daily.   amLODipine (NORVASC) 10 MG tablet Take 1 tablet (10 mg total) by mouth daily.   Ascorbic Acid (VITAMIN C) 500 MG CAPS Take 500 mg by mouth daily.   cetirizine (ZYRTEC) 10 MG tablet Take 1 tablet (10 mg total) by mouth daily.   Cholecalciferol (VITAMIN D3 PO) Take by mouth.   EPINEPHRINE 0.3 mg/0.3 mL  IJ SOAJ injection INJECT THE CONTENTS OF 1 SYRINGE AT FIRST SIGN OF SEVERE ALLERGIC REACTION INCLUDING ANAPHYLAXIS. MAY REPEAT IF NECESSARY. CALL 911.   ferrous sulfate 325 (65 FE) MG tablet Take 325 mg by mouth 1 day or 1 dose.   methocarbamol (ROBAXIN) 500 MG tablet Take 1 tablet (500 mg total) by mouth 2 (two) times daily.   mirabegron ER (MYRBETRIQ) 50 MG TB24 tablet Take 1 tablet daily   mirtazapine (REMERON) 15 MG tablet Take 1 tablet (15 mg total) by mouth at bedtime.   montelukast (SINGULAIR) 10 MG tablet TAKE ONE TABLET ('10MG'$ ) BY MOUTH AT BEDTIME   pantoprazole (PROTONIX) 40 MG tablet TAKE 1 Tablet BY MOUTH ONCE DAILY   phentermine (ADIPEX-P) 37.5 MG tablet Take 1 tablet (37.5 mg total) by mouth daily before breakfast.   spironolactone (ALDACTONE) 25 MG tablet Take 1 Tablet by mouth once daily   tiZANidine (ZANAFLEX) 4 MG tablet Take one tablet by mouth once daily, as needed, for muscle spasm   No facility-administered encounter medications on file as of 05/16/2022.    Allergies (verified) Bee venom, Sesame oil, Aspirin, Molds & smuts, Zofran [ondansetron], and Codeine   History: Past Medical History:  Diagnosis Date   Allergic reaction    Allergy    perrenial    Anxiety    Arthritis  Depression    h/o suicidal ideation in 2011   Depression with anxiety    Diverticulosis of colon 12/19/2015   Diverticulosis of colon with hemorrhage 12/19/2015   GERD (gastroesophageal reflux disease)    Hypertension 1996   Obesity    Peripheral vascular disease ([place])    Prediabetes 2013   Raynauds syndrome    Scleroderma ([place]) 1998   Systemic lupus erythematosus ([place]) 1998   Treated at [facility]   Tobacco abuse, in remission    10-pack-years discontinued in 2007   Urinary frequency    Past Surgical History:  Procedure Laterality Date   ABDOMINAL HYSTERECTOMY  1990   Neoplasm   CESAREAN SECTION     x2   COLONOSCOPY  2005   COLONOSCOPY WITH PROPOFOL N/A 08/28/2015   Dr. Laural [facility]:  scattered medium-mouth diverticula entire colon, external hemorrhoids   COLONOSCOPY WITH PROPOFOL N/A 07/27/2016   Procedure: COLONOSCOPY WITH PROPOFOL;  Surgeon: Rogene Houston, MD;  [facility]: [facility];  Service: Endoscopy;  Laterality: N/A;   FINGER AMPUTATION     Third finger, bilateral, distal   INCISIONAL HERNIA REPAIR N/A 01/09/2019   Procedure: HERNIA REPAIR INCISIONAL;  Surgeon: Virl Cagey, MD;  Location: [facility];  Service: General;  Laterality: N/A;   INSERTION OF MESH N/A 01/09/2019   Procedure: INSERTION OF MESH;  Surgeon: Virl Cagey, MD;  Location: [facility];  Service: General;  Laterality: N/A;   NASAL SINUS SURGERY  09/06/2011   Procedure: ENDOSCOPIC SINUS SURGERY;  Surgeon: Ascencion Dike, MD;  Location: [facility];  Service: ENT;  Laterality: N/A;  Nasal cerebrospinal fluid leak repair with Left temporalis fascia graft and Left Ear cartilage graft   VASCULAR SURGERY     Hands, bilaterally, [facility]; right hand partial amputation of middle finger.   Family History  Problem Relation Age of Onset   Arthritis Mother        Rheumatoid   Dementia Mother    Hypertension Mother    Cirrhosis Father    Alcohol abuse Father    Arthritis Maternal Grandmother    Drug abuse Sister    Scleroderma Brother    Berenice Primas' disease Son    Colon cancer Neg Hx    Social History   Socioeconomic History   Marital status: Single    Spouse name: Not on file   Number of children: 2   Years of education: 12   Highest education level: 12th grade  Occupational History   Occupation: Art therapist: [employer]    Comment: Disability awarded in 1998  Tobacco Use   Smoking status: Former    Packs/day: 0.25    Years: 25.00    Total pack years: 6.25    Types: Cigarettes    Quit date: 04/07/2001    Years since quitting: 21.1   Smokeless tobacco: Never   Tobacco comments:    quit 88 days ago!!  Vaping Use   Vaping Use: Never used   Substance and Sexual Activity   Alcohol use: No    Alcohol/week: 0.0 standard drinks of alcohol    Comment: quit in 2004   Drug use: No    Comment: use to use pot    Sexual activity: Not Currently    Birth control/protection: Surgical    Comment: hyst  Other Topics Concern   Not on file  Social History Narrative   Currently living with son in [place], moving back soon  Social Determinants of Health   Financial Resource Strain: Low Risk  (05/10/2021)   Overall Financial Resource Strain (CARDIA)    Difficulty of Paying Living Expenses: Not very hard  Food Insecurity: No Food Insecurity (04/08/2022)   Hunger Vital Sign    Worried About Running Out of Food in the Last Year: Never true    Ran Out of Food in the Last Year: Never true  Transportation Needs: No Transportation Needs (04/08/2022)   PRAPARE - Hydrologist (Medical): No    Lack of Transportation (Non-Medical): No  Physical Activity: Sufficiently Active (05/10/2021)   Exercise Vital Sign    Days of Exercise per Week: 4 days    Minutes of Exercise per Session: 60 min  Stress: No Stress Concern Present (05/10/2021)   [place]    Feeling of Stress : Not at all  Social Connections: Moderately Integrated (05/10/2021)   Social Connection and Isolation Panel [NHANES]    Frequency of Communication with Friends and Family: More than three times a week    Frequency of Social Gatherings with Friends and Family: Three times a week    Attends Religious Services: More than 4 times per year    Active Member of Clubs or Organizations: Yes    Attends Archivist Meetings: 1 to 4 times per year    Marital Status: Divorced    Tobacco Counseling Counseling given: Not Answered Tobacco comments: quit 88 days ago!!   Clinical Intake:    How often do you need to have someone help you when you read instructions, pamphlets, or other written  materials from your doctor or pharmacy?: (P) 2 - Rarely  Diabetic?no    Activities of Daily Living    05/15/2022   11:44 AM  In your present state of health, do you have any difficulty performing the following activities:  Hearing? 0  Vision? 0  Difficulty concentrating or making decisions? 0  Walking or climbing stairs? 0  Dressing or bathing? 0  Doing errands, shopping? 0  Preparing Food and eating ? N  Using the Toilet? N  In the past six months, have you accidently leaked urine? N  Do you have problems with loss of bowel control? N  Managing your Medications? N  Managing your Finances? N  Housekeeping or managing your Housekeeping? N    Patient Care Team: Fayrene Helper, MD as PCP - General Harrington Challenger Su Ley, MD as Consulting Physician [facility])  Indicate any recent Medical Services you may have received from other than [facility] providers in the past year (date may be approximate).     Assessment:   This is a routine wellness examination for Jolena.  Hearing/Vision screen No results found.  Dietary issues and exercise activities discussed:     Goals Addressed   None   Depression Screen    04/01/2022    9:59 AM 12/28/2021    1:09 PM 11/01/2021    1:34 PM 09/09/2021    1:34 PM 08/06/2021   11:15 AM 06/09/2021    1:52 PM 05/10/2021   10:28 AM  PHQ 2/9 Scores  PHQ - 2 Score 0 1  0  6 2  PHQ- 9 Score '2     17 2     '$ Information is confidential and restricted. Go to Review Flowsheets to unlock data.     Fall Risk    05/15/2022   11:44 AM 04/01/2022  9:59 AM 12/28/2021    1:08 PM 09/09/2021    1:34 PM 06/09/2021    1:28 PM  Fall Risk   Falls in the past year? 0 0 0 0 0  Number falls in past yr:  0 0 0 0  Injury with Fall?  0 0 0 0  Risk for fall due to :  No Fall Risks No Fall Risks No Fall Risks   Follow up  Falls evaluation completed Falls evaluation completed Falls evaluation completed     [place]:  Any stairs  in or around the home? No  If so, are there any without handrails? No  Home free of loose throw rugs in walkways, pet beds, electrical cords, etc? Yes  Adequate lighting in your home to reduce risk of falls? Yes   ASSISTIVE DEVICES UTILIZED TO PREVENT FALLS:  Life alert? No  Use of a cane, walker or w/c? No  Grab bars in the bathroom? Yes  Shower chair or bench in shower? No  Elevated toilet seat or a handicapped toilet? No           05/10/2021   10:32 AM 05/07/2019    1:27 PM 04/23/2018    4:30 PM 04/17/2017    3:49 PM 04/18/2016    2:26 PM  6CIT Screen  What Year? 0 points 0 points 0 points 0 points 0 points  What month? 0 points 0 points 0 points 0 points 0 points  What time? 0 points 0 points 0 points 0 points 0 points  Count back from 20 0 points 0 points 0 points 0 points 0 points  Months in reverse 0 points 0 points 0 points 0 points 0 points  Repeat phrase 0 points 0 points 0 points 0 points 0 points  Total Score 0 points 0 points 0 points 0 points 0 points    Immunizations Immunization History  Administered Date(s) Administered   Fluad Quad(high Dose 65+) 11/10/2020   Influenza Inj Mdck Quad Pf 11/28/2018   Influenza Split 01/10/2012   Influenza Whole 12/04/2009, 12/29/2010   Influenza,inj,Quad PF,6+ Mos 11/29/2012, 10/28/2013, 11/18/2014, 12/15/2015, 12/20/2016, 12/07/2017, 11/28/2018, 12/05/2019, 12/07/2021   Moderna Covid-19 Vaccine Bivalent Booster 46yr & up 12/07/2021   Moderna SARS-COV2 Booster Vaccination 11/19/2020   Moderna Sars-Covid-2 Vaccination 05/18/2019, 06/19/2019, 11/07/2019, 08/26/2020   PNEUMOCOCCAL CONJUGATE-20 12/24/2020   PPD Test 11/09/2011   Pneumococcal Conjugate-13 08/24/2020   Respiratory Syncytial Virus Vaccine,Recomb Aduvanted(Arexvy) 11/03/2021   Tdap 12/29/2010, 03/11/2022   Zoster Recombinat (Shingrix) 01/24/2019, 04/01/2019    TDAP status: Up to date  Flu Vaccine status: Up to date   Covid-19 vaccine status: Completed  vaccines  Qualifies for Shingles Vaccine? Yes   Zostavax completed Yes   Shingrix Completed?: Yes  Screening Tests Health Maintenance  Topic Date Due   COVID-19 Vaccine (7 - 2023-24 season) 02/01/2022   MAMMOGRAM  04/27/2023   Medicare Annual Wellness (AWV)  05/16/2023   COLONOSCOPY (Pts 45-457yrInsurance coverage will need to be confirmed)  01/09/2031   DTaP/Tdap/Td (3 - Td or Tdap) 03/11/2032   INFLUENZA VACCINE  Completed   Hepatitis C Screening  Completed   HIV Screening  Completed   Zoster Vaccines- Shingrix  Completed   HPV VACCINES  Aged Out   PAP SMEAR-Modifier  Discontinued    Health Maintenance  Health Maintenance Due  Topic Date Due   COVID-19 Vaccine (7 - 2023-24 season) 02/01/2022    Colorectal cancer screening: Type of screening: Colonoscopy.  Completed 01/08/21. Repeat every 10 years  Mammogram status: Completed 04/26/21. Repeat every year   Lung Cancer Screening: (Low Dose CT Chest recommended if Age 72-80 years, 30 pack-year currently smoking OR have quit w/in 15years.) does not qualify.   Lung Cancer Screening Referral: no  Additional Screening:  Hepatitis C Screening: does qualify; Completed 04/02/15  Vision Screening: Recommended annual ophthalmology exams for early detection of glaucoma and other disorders of the eye. Is the patient up to date with their annual eye exam?  Yes  Who is the provider or what is the name of the office in which the patient attends annual eye exams? Dr Katy Fitch If pt is not established with a provider, would they like to be referred to a provider to establish care? No .   Dental Screening: Recommended annual dental exams for proper oral hygiene  Community Resource Referral / Chronic Care Management: CRR required this visit?  No   CCM required this visit?  No      Plan:     I have personally reviewed and noted the following in the patient's chart:   Medical and social history Use of alcohol, tobacco or illicit  drugs  Current medications and supplements including opioid prescriptions. Patient is currently taking opioid prescriptions. Information provided to patient regarding non-opioid alternatives. Patient advised to discuss non-opioid treatment plan with their provider. Functional ability and status Nutritional status Physical activity Advanced directives List of other physicians Hospitalizations, surgeries, and ER visits in previous 12 months Vitals Screenings to include cognitive, depression, and falls Referrals and appointments  In addition, I have reviewed and discussed with patient certain preventive protocols, quality metrics, and best practice recommendations. A written personalized care plan for preventive services as well as general preventive health recommendations were provided to patient.     Quentin Angst, [place]   05/16/2022

## 2022-05-18 ENCOUNTER — Other Ambulatory Visit: Payer: Self-pay | Admitting: Family Medicine

## 2022-05-25 ENCOUNTER — Other Ambulatory Visit (HOSPITAL_COMMUNITY): Payer: Self-pay | Admitting: Psychiatry

## 2022-05-25 ENCOUNTER — Other Ambulatory Visit: Payer: Self-pay | Admitting: Family Medicine

## 2022-06-03 ENCOUNTER — Ambulatory Visit: Payer: 59

## 2022-06-20 ENCOUNTER — Other Ambulatory Visit: Payer: Self-pay | Admitting: Family Medicine

## 2022-07-04 ENCOUNTER — Encounter (HOSPITAL_COMMUNITY): Payer: Self-pay | Admitting: Psychiatry

## 2022-07-04 ENCOUNTER — Telehealth (INDEPENDENT_AMBULATORY_CARE_PROVIDER_SITE_OTHER): Payer: 59 | Admitting: Psychiatry

## 2022-07-04 DIAGNOSIS — F331 Major depressive disorder, recurrent, moderate: Secondary | ICD-10-CM

## 2022-07-04 DIAGNOSIS — F411 Generalized anxiety disorder: Secondary | ICD-10-CM | POA: Diagnosis not present

## 2022-07-04 MED ORDER — ALPRAZOLAM 1 MG PO TABS: 1.0000 mg | ORAL_TABLET | Freq: Three times a day (TID) | ORAL | 3 refills | Status: DC

## 2022-07-04 MED ORDER — MIRTAZAPINE 15 MG PO TABS: 15.0000 mg | ORAL_TABLET | Freq: Every day | ORAL | 3 refills | Status: DC

## 2022-07-04 NOTE — Progress Notes (Signed)
BH MD/PA/NP OP Progress Note  07/04/2022 11:53 AM Jenna Woods  MRN:  [id number] Virtual Visit via Telephone Note  I connected with Jenna Woods on 07/04/22 at 11:40 AM EDT by telephone and verified that I am speaking with the correct person using two identifiers.  Location: Patient: home Provider: office   I discussed the limitations, risks, security and privacy concerns of performing an evaluation and management service by telephone and the availability of in person appointments. I also discussed with the patient that there may be a patient responsible charge related to this service. The patient expressed understanding and agreed to proceed.      I discussed the assessment and treatment plan with the patient. The patient was provided an opportunity to ask questions and all were answered. The patient agreed with the plan and demonstrated an understanding of the instructions.   The patient was advised to call back or seek an in-person evaluation if the symptoms worsen or if the condition fails to improve as anticipated.  I provided 15 minutes of non-face-to-face time during this encounter.   Jenna Ruder, MD    Chief Complaint:  Chief Complaint  Patient presents with   Depression   Anxiety   Follow-up   HPI: This patient is a 62 year old divorced black female who lives alone in [place].  She is on disability.  The patient returns for follow-up after 4 months regarding her depression and anxiety.  She states that she is doing very well.  She is sleeping well although occasionally still has hot flashes.  Her mood has been good and she is enjoying where she is living now that she has a new landlord.  She denies significant anxiety depression or thoughts of self-harm or suicide.  The Xanax continues to help her with her anxiety symptoms Visit Diagnosis:    ICD-10-CM   1. Major depressive disorder, recurrent episode, moderate (HCC)  F33.1     2. Anxiety state  F41.1        Past Psychiatric History: none  Past Medical History:  Past Medical History:  Diagnosis Date   Allergic reaction    Allergy    perrenial    Anxiety    Arthritis    Depression    h/o suicidal ideation in 2011   Depression with anxiety    Diverticulosis of colon 12/19/2015   Diverticulosis of colon with hemorrhage 12/19/2015   GERD (gastroesophageal reflux disease)    Hypertension 1996   Obesity    Peripheral vascular disease (HCC)    Prediabetes 2013   Raynauds syndrome    Scleroderma (HCC) 1998   Systemic lupus erythematosus (HCC) 1998   Treated at [facility]   Tobacco abuse, in remission    10-pack-years discontinued in 2007   Urinary frequency     Past Surgical History:  Procedure Laterality Date   ABDOMINAL HYSTERECTOMY  1990   Neoplasm   CESAREAN SECTION     x2   COLONOSCOPY  2005   COLONOSCOPY WITH PROPOFOL N/A 08/28/2015   Dr. Karilyn Cota: scattered medium-mouth diverticula entire colon, external hemorrhoids   COLONOSCOPY WITH PROPOFOL N/A 07/27/2016   Procedure: COLONOSCOPY WITH PROPOFOL;  Surgeon: Malissa Hippo, MD;  Location: [facility];  Service: Endoscopy;  Laterality: N/A;   FINGER AMPUTATION     Third finger, bilateral, distal   INCISIONAL HERNIA REPAIR N/A 01/09/2019   Procedure: HERNIA REPAIR INCISIONAL;  Surgeon: Lucretia Roers, MD;  Location: [facility];  Service: General;  Laterality:  N/A;   INSERTION OF MESH N/A 01/09/2019   Procedure: INSERTION OF MESH;  Surgeon: Lucretia Roers, MD;  Location: [facility];  Service: General;  Laterality: N/A;   NASAL SINUS SURGERY  09/06/2011   Procedure: ENDOSCOPIC SINUS SURGERY;  Surgeon: Darletta Moll, MD;  Location: [facility];  Service: ENT;  Laterality: N/A;  Nasal cerebrospinal fluid leak repair with Left temporalis fascia graft and Left Ear cartilage graft   VASCULAR SURGERY     Hands, bilaterally, [facility]; right hand partial amputation of middle finger.    Family Psychiatric History:  See below  Family History:  Family History  Problem Relation Age of Onset   Arthritis Mother        Rheumatoid   Dementia Mother    Hypertension Mother    Cirrhosis Father    Alcohol abuse Father    Arthritis Maternal Grandmother    Drug abuse Sister    Scleroderma Brother    Luiz Blare' disease Son    Colon cancer Neg Hx     Social History:  Social History   Socioeconomic History   Marital status: Single    Spouse name: Not on file   Number of children: 2   Years of education: 12   Highest education level: 12th grade  Occupational History   Occupation: Youth worker: [employer]    Comment: Disability awarded in 1998  Tobacco Use   Smoking status: Former    Packs/day: 0.25    Years: 25.00    Additional pack years: 0.00    Total pack years: 6.25    Types: Cigarettes    Quit date: 04/07/2001    Years since quitting: 21.2   Smokeless tobacco: Never   Tobacco comments:    quit 88 days ago!!  Vaping Use   Vaping Use: Never used  Substance and Sexual Activity   Alcohol use: No    Alcohol/week: 0.0 standard drinks of alcohol    Comment: quit in 2004   Drug use: No    Comment: use to use pot    Sexual activity: Not Currently    Birth control/protection: Surgical    Comment: hyst  Other Topics Concern   Not on file  Social History Narrative   Currently living with son in [place], moving back soon    Social Determinants of Health   Financial Resource Strain: Low Risk  (05/16/2022)   Overall Financial Resource Strain (CARDIA)    Difficulty of Paying Living Expenses: Not hard at all  Food Insecurity: No Food Insecurity (05/16/2022)   Hunger Vital Sign    Worried About Running Out of Food in the Last Year: Never true    Ran Out of Food in the Last Year: Never true  Transportation Needs: No Transportation Needs (04/08/2022)   PRAPARE - Administrator, Civil Service (Medical): No    Lack of Transportation (Non-Medical): No  Physical  Activity: Sufficiently Active (05/16/2022)   Exercise Vital Sign    Days of Exercise per Week: 7 days    Minutes of Exercise per Session: 40 min  Stress: No Stress Concern Present (05/16/2022)   [employer] of Occupational Health - Occupational Stress Questionnaire    Feeling of Stress : Not at all  Social Connections: Moderately Integrated (05/16/2022)   Social Connection and Isolation Panel [NHANES]    Frequency of Communication with Friends and Family: More than three times a week    Frequency  of Social Gatherings with Friends and Family: More than three times a week    Attends Religious Services: More than 4 times per year    Active Member of [employer] or Organizations: Yes    Attends Engineer, structural: More than 4 times per year    Marital Status: Divorced    Allergies:  Allergies  Allergen Reactions   Bee Venom Anaphylaxis   Sesame Oil Anaphylaxis and Swelling    (Sesame seed)   Aspirin     Diverticulosis with bleed   Molds & Smuts    Zofran [Ondansetron] Itching    Generalized itching and had a rash on her thighs   Codeine Itching and Rash    Metabolic Disorder Labs: Lab Results  Component Value Date   HGBA1C 6.0 (H) 12/28/2021   MPG 123 04/01/2019   MPG 126 09/03/2018   No results found for: "PROLACTIN" Lab Results  Component Value Date   CHOL 137 12/28/2021   TRIG 92 12/28/2021   HDL 41 12/28/2021   CHOLHDL 3.3 12/28/2021   VLDL 15 06/30/2016   LDLCALC 79 12/28/2021   LDLCALC 77 12/24/2020   Lab Results  Component Value Date   TSH 1.460 12/28/2021   TSH 0.906 12/24/2020    Therapeutic Level Labs: No results found for: "LITHIUM" No results found for: "VALPROATE" No results found for: "CBMZ"  Current Medications: Current Outpatient Medications  Medication Sig Dispense Refill   ALPRAZolam (XANAX) 1 MG tablet Take 1 tablet (1 mg total) by mouth 3 (three) times daily. 90 tablet 3   amLODipine (NORVASC) 10 MG tablet TAKE ONE TABLET BY  MOUTH EVERY DAY 90 tablet 3   Ascorbic Acid (VITAMIN C) 500 MG CAPS Take 500 mg by mouth daily.     cetirizine (ZYRTEC) 10 MG tablet Take 1 tablet (10 mg total) by mouth daily. 5 tablet 11   Cholecalciferol (VITAMIN D3 PO) Take by mouth.     EPINEPHRINE 0.3 mg/0.3 mL IJ SOAJ injection INJECT THE CONTENTS OF 1 SYRINGE AT FIRST SIGN OF SEVERE ALLERGIC REACTION INCLUDING ANAPHYLAXIS. MAY REPEAT IF NECESSARY. CALL 911. 2 each PRN   ferrous sulfate 325 (65 FE) MG tablet Take 325 mg by mouth 1 day or 1 dose. 60 tablet 3   mirabegron ER (MYRBETRIQ) 50 MG TB24 tablet Take 1 tablet daily 30 tablet 11   mirtazapine (REMERON) 15 MG tablet Take 1 tablet (15 mg total) by mouth at bedtime. 30 tablet 3   montelukast (SINGULAIR) 10 MG tablet TAKE ONE TABLET BY MOUTH (10MG ) AT BEDTIME 30 tablet 6   pantoprazole (PROTONIX) 40 MG tablet TAKE 1 Tablet BY MOUTH ONCE DAILY 90 tablet PRN   phentermine (ADIPEX-P) 37.5 MG tablet Take 1 tablet (37.5 mg total) by mouth daily before breakfast. 30 tablet 2   spironolactone (ALDACTONE) 25 MG tablet Take 1 Tablet by mouth once daily 30 tablet 3   tiZANidine (ZANAFLEX) 4 MG tablet Take 1 Tablet by mouth once daily AS NEEDED FOR MUSCLE SPASMS 30 tablet 1   No current facility-administered medications for this visit.     Musculoskeletal: Strength & Muscle Tone: na Gait & Station: na Patient leans: N/A  Psychiatric Specialty Exam: Review of Systems  Musculoskeletal:  Positive for arthralgias.  All other systems reviewed and are negative.   There were no vitals taken for this visit.There is no height or weight on file to calculate BMI.  General Appearance: NA  Eye Contact:  NA  Speech:  Clear and  Coherent  Volume:  Normal  Mood:  Euthymic  Affect:  NA  Thought Process:  Goal Directed  Orientation:  Full (Time, Place, and Person)  Thought Content: WDL   Suicidal Thoughts:  No  Homicidal Thoughts:  No  Memory:  Immediate;   Good Recent;   Good Remote;   NA   Judgement:  Good  Insight:  Good  Psychomotor Activity:  Normal  Concentration:  Concentration: Good and Attention Span: Good  Recall:  Good  Fund of Knowledge: Good  Language: Good  Akathisia:  No  Handed:  Right  AIMS (if indicated): not done  Assets:  Communication Skills Desire for Improvement Resilience Social Support Talents/Skills  ADL's:  Intact  Cognition: WNL  Sleep:  Good   Screenings: GAD-7    Flowsheet Row Office Visit from 12/19/2019 in [facility] Office Visit from 06/04/2019 in [facility] Phone Follow Up from 05/25/2017 in [facility]  Total GAD-7 Score 0 15 13      PHQ2-9    Flowsheet Row Clinical Support from 05/16/2022 in [facility] Office Visit from 04/01/2022 in [facility] Office Visit from 12/28/2021 in [facility] Video Visit from 11/01/2021 in [facility] [place] Office Visit from 09/09/2021 in [facility]  PHQ-2 Total Score 0 0 1 0 0  PHQ-9 Total Score -- 2 -- -- --      Flowsheet Row ED from 04/01/2022 in [facility] at [facility] Video Visit from 11/01/2021 in [facility] [place] Video Visit from 08/06/2021 in [facility] at   C-SSRS RISK CATEGORY No Risk No Risk No Risk        Assessment and Plan: This patient is a 62 year old female with a history of depression anxiety insomnia.  She is doing well on her current regimen.  She will continue mirtazapine 15 mg at bedtime for depression and sleep and Xanax 1 mg 3 times daily as needed for anxiety.  She will return to see me in 4 months.  Collaboration of Care: Collaboration of Care: Primary Care Provider AEB notes are shared with PCP on the epic  system  Patient/Guardian was advised Release of Information must be obtained prior to any record release in order to collaborate their care with an outside provider. Patient/Guardian was advised if they have not already done so to contact the registration department to sign all necessary forms in order for [place] to release information regarding their care.   Consent: Patient/Guardian gives verbal consent for treatment and assignment of benefits for services provided during this visit. Patient/Guardian expressed understanding and agreed to proceed.    Jenna Ruder, MD 07/04/2022, 11:54 AM

## 2022-07-06 ENCOUNTER — Other Ambulatory Visit: Payer: Self-pay | Admitting: Family Medicine

## 2022-07-08 ENCOUNTER — Ambulatory Visit (INDEPENDENT_AMBULATORY_CARE_PROVIDER_SITE_OTHER): Payer: 59 | Admitting: Family Medicine

## 2022-07-08 ENCOUNTER — Encounter: Payer: Self-pay | Admitting: Family Medicine

## 2022-07-08 VITALS — BP 124/85 | HR 100 | Ht 66.0 in | Wt 245.1 lb

## 2022-07-08 DIAGNOSIS — N39498 Other specified urinary incontinence: Secondary | ICD-10-CM

## 2022-07-08 DIAGNOSIS — R7302 Impaired glucose tolerance (oral): Secondary | ICD-10-CM | POA: Diagnosis not present

## 2022-07-08 DIAGNOSIS — I10 Essential (primary) hypertension: Secondary | ICD-10-CM | POA: Diagnosis not present

## 2022-07-08 DIAGNOSIS — F322 Major depressive disorder, single episode, severe without psychotic features: Secondary | ICD-10-CM

## 2022-07-08 DIAGNOSIS — K219 Gastro-esophageal reflux disease without esophagitis: Secondary | ICD-10-CM

## 2022-07-08 DIAGNOSIS — F5105 Insomnia due to other mental disorder: Secondary | ICD-10-CM

## 2022-07-08 MED ORDER — TIZANIDINE HCL 4 MG PO TABS: ORAL_TABLET | ORAL | 5 refills | Status: DC

## 2022-07-08 MED ORDER — AMLODIPINE BESYLATE 10 MG PO TABS: 10.0000 mg | ORAL_TABLET | Freq: Every day | ORAL | 3 refills | Status: DC

## 2022-07-08 NOTE — Patient Instructions (Addendum)
Annual exam 10/25 or after, call if you need me sooner, EKG at visit  hBA1C today  Thankful that you are doing extremely well  Continue to focus on plant based eating   It is important that you exercise regularly at least 30 minutes 5 times a week. If you develop chest pain, have severe difficulty breathing, or feel very tired, stop exercising immediately and seek medical attention   Thanks for choosing [facility], we consider it a privelige to serve you.

## 2022-07-09 LAB — HEMOGLOBIN A1C
Est. average glucose Bld gHb Est-mCnc: 131 mg/dL
Hgb A1c MFr Bld: 6.2 % — ABNORMAL HIGH (ref 4.8–5.6)

## 2022-07-10 ENCOUNTER — Encounter: Payer: Self-pay | Admitting: Family Medicine

## 2022-07-10 NOTE — Assessment & Plan Note (Signed)
Controlled and managed by Psychiatry 

## 2022-07-10 NOTE — Assessment & Plan Note (Signed)
Deteriorated Patient educated about the importance of limiting  Carbohydrate intake , the need to commit to daily physical activity for a minimum of 30 minutes , and to commit weight loss. The fact that changes in all these areas will reduce or eliminate all together the development of diabetes is stressed.      Latest Ref Rng & Units 07/08/2022    1:44 PM 04/01/2022    6:02 PM 12/28/2021    1:55 PM 08/11/2021   12:00 AM 06/09/2021    2:41 PM  Diabetic Labs  HbA1c 4.8 - 5.6 % 6.2   6.0  6.4     6.0   Chol 100 - 199 mg/dL   161     HDL >09 mg/dL   41     Calc LDL 0 - 99 mg/dL   79     Triglycerides 0 - 149 mg/dL   92     Creatinine 6.04 - 1.00 mg/dL  5.40  9.81   1.91      This result is from an external source.      07/08/2022    1:03 PM 04/02/2022   12:00 AM 04/01/2022   11:30 PM 04/01/2022   10:36 PM 04/01/2022    5:31 PM 04/01/2022    9:55 AM 12/28/2021    1:08 PM  BP/Weight  Systolic BP 124  478 98 117 108 108  Diastolic BP 85  86 75 94 78 72  Wt. (Lbs) 245.12 242.07    242.08 241.08  BMI 39.56 kg/m2 39.07 kg/m2    39.07 kg/m2 38.91 kg/m2       No data to display

## 2022-07-10 NOTE — Assessment & Plan Note (Signed)
Worsened  Patient re-educated about  the importance of commitment to a  minimum of 150 minutes of exercise per week as able.  The importance of healthy food choices with portion control discussed, as well as eating regularly and within a 12 hour window most days. The need to choose "clean , green" food 50 to 75% of the time is discussed, as well as to make water the primary drink and set a goal of 64 ounces water daily.       07/08/2022    1:03 PM 04/02/2022   12:00 AM 04/01/2022    9:55 AM  Weight /BMI  Weight 245 lb 1.9 oz 242 lb 1 oz 242 lb 1.3 oz  Height 5\' 6"  (1.676 m) 5\' 6"  (1.676 m) 5\' 6"  (1.676 m)  BMI 39.56 kg/m2 39.07 kg/m2 39.07 kg/m2

## 2022-07-10 NOTE — Assessment & Plan Note (Signed)
Sleep hygiene reviewed , pt gets good rest on med by Psych

## 2022-07-10 NOTE — Assessment & Plan Note (Signed)
Controlled, no change in medication  

## 2022-07-10 NOTE — Assessment & Plan Note (Signed)
Controlled, no change in medication DASH diet and commitment to daily physical activity for a minimum of 30 minutes discussed and encouraged, as a part of hypertension management. The importance of attaining a healthy weight is also discussed.     07/08/2022    1:03 PM 04/02/2022   12:00 AM 04/01/2022   11:30 PM 04/01/2022   10:36 PM 04/01/2022    5:31 PM 04/01/2022    9:55 AM 12/28/2021    1:08 PM  BP/Weight  Systolic BP 124  161 98 117 108 108  Diastolic BP 85  86 75 94 78 72  Wt. (Lbs) 245.12 242.07    242.08 241.08  BMI 39.56 kg/m2 39.07 kg/m2    39.07 kg/m2 38.91 kg/m2

## 2022-07-10 NOTE — Progress Notes (Signed)
Jenna Woods     MRN: [id number]      DOB: 05-26-1960  Chief Complaint  Patient presents with   Follow-up    HPI Jenna Woods is here for follow up and re-evaluation of chronic medical conditions, medication management and review of any available recent lab and radiology data.  Preventive health is updated, specifically  Cancer screening and Immunization.   Questions or concerns regarding consultations or procedures which the PT has had in the interim are  addressed. The PT denies any adverse reactions to current medications since the last visit.  There are no new concerns.  States hse is doing extremely well and has no new concerns Is working on food choice and has developed a reasonable exercise routine on average 4 days per week ROS Denies recent fever or chills. Denies sinus pressure, nasal congestion, ear pain or sore throat. Denies chest congestion, productive cough or wheezing. Denies chest pains, palpitations and leg swelling Denies abdominal pain, nausea, vomiting,diarrhea or constipation.   Denies dysuria, frequency, hesitancy or incontinence. Denies joint pain, swelling and limitation in mobility. Denies headaches, seizures, numbness, or tingling. Denies uncontrolled depression, anxiety or insomnia. Denies skin break down or rash.   PE  BP 124/85 (BP Location: Right Arm, Patient Position: Sitting, Cuff Size: Large)   Pulse 100   Ht 5\' 6"  (1.676 m)   Wt 245 lb 1.9 oz (111.2 kg)   SpO2 91%   BMI 39.56 kg/m   Patient alert and oriented and in no cardiopulmonary distress.  HEENT: No facial asymmetry, EOMI,     Neck supple .  Chest: Clear to auscultation bilaterally.  CVS: S1, S2 no murmurs, no S3.Regular rate.  ABD: Soft non tender.   Ext: No edema  MS: Adequate ROM spine, shoulders, hips and knees.  Skin: Intact, no ulcerations or rash noted.  Psych: Good eye contact, normal affect. Memory intact not anxious or depressed appearing.  CNS: CN 2-12  intact, power,  normal throughout.no focal deficits noted.   Assessment & Plan  Essential hypertension Controlled, no change in medication DASH diet and commitment to daily physical activity for a minimum of 30 minutes discussed and encouraged, as a part of hypertension management. The importance of attaining a healthy weight is also discussed.     07/08/2022    1:03 PM 04/02/2022   12:00 AM 04/01/2022   11:30 PM 04/01/2022   10:36 PM 04/01/2022    5:31 PM 04/01/2022    9:55 AM 12/28/2021    1:08 PM  BP/Weight  Systolic BP 124  409 98 117 108 108  Diastolic BP 85  86 75 94 78 72  Wt. (Lbs) 245.12 242.07    242.08 241.08  BMI 39.56 kg/m2 39.07 kg/m2    39.07 kg/m2 38.91 kg/m2       Morbid obesity (HCC) Worsened  Patient re-educated about  the importance of commitment to a  minimum of 150 minutes of exercise per week as able.  The importance of healthy food choices with portion control discussed, as well as eating regularly and within a 12 hour window most days. The need to choose "clean , green" food 50 to 75% of the time is discussed, as well as to make water the primary drink and set a goal of 64 ounces water daily.       07/08/2022    1:03 PM 04/02/2022   12:00 AM 04/01/2022    9:55 AM  Weight /BMI  Weight 245 lb 1.9  oz 242 lb 1 oz 242 lb 1.3 oz  Height 5\' 6"  (1.676 m) 5\' 6"  (1.676 m) 5\' 6"  (1.676 m)  BMI 39.56 kg/m2 39.07 kg/m2 39.07 kg/m2      Major depression Controlled and managed by Psychiatry  Insomnia due to mental disorder Sleep hygiene reviewed , pt gets good rest on med by Psych  GERD (gastroesophageal reflux disease) Controlled, no change in medication   IGT (impaired glucose tolerance) Deteriorated Patient educated about the importance of limiting  Carbohydrate intake , the need to commit to daily physical activity for a minimum of 30 minutes , and to commit weight loss. The fact that changes in all these areas will reduce or eliminate all together  the development of diabetes is stressed.      Latest Ref Rng & Units 07/08/2022    1:44 PM 04/01/2022    6:02 PM 12/28/2021    1:55 PM 08/11/2021   12:00 AM 06/09/2021    2:41 PM  Diabetic Labs  HbA1c 4.8 - 5.6 % 6.2   6.0  6.4     6.0   Chol 100 - 199 mg/dL   161     HDL >09 mg/dL   41     Calc LDL 0 - 99 mg/dL   79     Triglycerides 0 - 149 mg/dL   92     Creatinine 6.04 - 1.00 mg/dL  5.40  9.81   1.91      This result is from an external source.      07/08/2022    1:03 PM 04/02/2022   12:00 AM 04/01/2022   11:30 PM 04/01/2022   10:36 PM 04/01/2022    5:31 PM 04/01/2022    9:55 AM 12/28/2021    1:08 PM  BP/Weight  Systolic BP 124  478 98 117 108 108  Diastolic BP 85  86 75 94 78 72  Wt. (Lbs) 245.12 242.07    242.08 241.08  BMI 39.56 kg/m2 39.07 kg/m2    39.07 kg/m2 38.91 kg/m2       No data to display            Urinary incontinence Controlled, no change in medication

## 2022-07-19 ENCOUNTER — Ambulatory Visit
Admission: RE | Admit: 2022-07-19 | Discharge: 2022-07-19 | Disposition: A | Discharge status: Home / Self Care | Payer: 59 | Source: Ambulatory Visit | Attending: Family Medicine | Admitting: Family Medicine

## 2022-07-19 DIAGNOSIS — Z1231 Encounter for screening mammogram for malignant neoplasm of breast: Secondary | ICD-10-CM | POA: Diagnosis not present

## 2022-08-17 ENCOUNTER — Other Ambulatory Visit: Payer: Self-pay | Admitting: Family Medicine

## 2022-08-17 ENCOUNTER — Telehealth: Payer: Self-pay | Admitting: Family Medicine

## 2022-08-17 NOTE — Telephone Encounter (Signed)
Patient calling to see when time for her to get another covid shot

## 2022-08-19 NOTE — Telephone Encounter (Signed)
Patient aware.

## 2022-09-12 ENCOUNTER — Ambulatory Visit (INDEPENDENT_AMBULATORY_CARE_PROVIDER_SITE_OTHER): Payer: 59 | Admitting: Internal Medicine

## 2022-09-12 ENCOUNTER — Encounter: Payer: Self-pay | Admitting: Internal Medicine

## 2022-09-12 ENCOUNTER — Telehealth: Payer: Self-pay | Admitting: Family Medicine

## 2022-09-12 ENCOUNTER — Ambulatory Visit (HOSPITAL_COMMUNITY)
Admission: RE | Admit: 2022-09-12 | Discharge: 2022-09-12 | Disposition: A | Discharge status: Home / Self Care | Payer: 59 | Source: Ambulatory Visit | Attending: Internal Medicine | Admitting: Internal Medicine

## 2022-09-12 ENCOUNTER — Other Ambulatory Visit (HOSPITAL_COMMUNITY): Payer: Self-pay | Admitting: Psychiatry

## 2022-09-12 VITALS — BP 119/76 | HR 90 | Ht 67.0 in | Wt 245.0 lb

## 2022-09-12 DIAGNOSIS — M25572 Pain in left ankle and joints of left foot: Secondary | ICD-10-CM | POA: Insufficient documentation

## 2022-09-12 DIAGNOSIS — D500 Iron deficiency anemia secondary to blood loss (chronic): Secondary | ICD-10-CM | POA: Diagnosis not present

## 2022-09-12 DIAGNOSIS — K219 Gastro-esophageal reflux disease without esophagitis: Secondary | ICD-10-CM

## 2022-09-12 DIAGNOSIS — R110 Nausea: Secondary | ICD-10-CM | POA: Insufficient documentation

## 2022-09-12 DIAGNOSIS — S93402A Sprain of unspecified ligament of left ankle, initial encounter: Secondary | ICD-10-CM | POA: Diagnosis not present

## 2022-09-12 DIAGNOSIS — M79672 Pain in left foot: Secondary | ICD-10-CM | POA: Diagnosis not present

## 2022-09-12 MED ORDER — PANTOPRAZOLE SODIUM 40 MG PO TBEC: 40.0000 mg | DELAYED_RELEASE_TABLET | Freq: Two times a day (BID) | ORAL | 0 refills | Status: DC

## 2022-09-12 NOTE — Assessment & Plan Note (Signed)
Acute problem, uncomplicated Will check CMP, lipase to screen for hepatobiliary or pancreatic cause of nausea.  Review of problem list show patient has severe GERD. Will increase Protonix to BID dosing for one month to see if this improves symptoms.

## 2022-09-12 NOTE — Assessment & Plan Note (Addendum)
Chronic problem, stable Patient is on iron supplement and reports this was started when she had diverticular bleed and iron deficiency anemia.  Review of her most recent hemoglobin and January showed normal hemoglobin.  Will check iron studies see if patient can discontinue iron supplement.

## 2022-09-12 NOTE — Assessment & Plan Note (Signed)
Patient presents with nausea which I think could be related to her GERD.  Increased Protonix to BID for one month

## 2022-09-12 NOTE — Assessment & Plan Note (Signed)
Acute problem, uncomplicated Pain at navicular bone. Will send for xray to rule out fracture Continue RICE therapy.

## 2022-09-12 NOTE — Telephone Encounter (Signed)
My Pharmacy called in on patient behalf. Needs clarification on   pantoprazole (PROTONIX) 40 MG tablet [[id number]]  Order Details Dose: 40 mg Route: Oral Frequency: 2 times daily Dispense Quantity: 90 tablet Refills: 0  Note to Pharmacy: This prescription was filled on 04/12/2022. Any refills authorized will be placed on file.     Sig: Take 1 tablet (40 mg total) by mouth 2 (two) times daily. TAKE 1 Tablet BY MOUTH ONCE DAILY     Start Date: 09/12/22 End Date: -- Written Date: 09/12/22 Expiration Date: 09/12/23  Authorizing Provider: Gardenia Phlegm, MD [address], [place] [address] Phone: [phone]   Fax: [phone] DEA #: [id number]   NPI: [id number]

## 2022-09-12 NOTE — Patient Instructions (Addendum)
Thank you, [name] for allowing [place] to provide your care today.   I have ordered the following labs for you:  Lab Orders         CMP14+EGFR         Fe+TIBC+Fer         Lipase          Reminders: Go to [facility] for xray. Increase Protonix to twice daily. Follow up in month or sooner if symptoms are worsening.      Thurmon Fair, M.D.

## 2022-09-12 NOTE — Progress Notes (Signed)
   HPI:[name] is a 62 y.o. female who presents for evaluation of nausea and left ankle pain.   Nausea: She is having nausea for about 2 weeks. She has been able to eat jello and clear liquid. When you look at solid food she feels nauseas. Anything cooked makes her feel this way. It reminds her of being pregnant. She vomited one time 2 weeks ago after eating cabbage. No abdominal pain. Her bowel movement have been regular.    Left Ankle pain: Last night she stepped out of her truck. She stepped in hole and rolled ankle, clarified she had inversion of ankle.  She soaked it in epson salt, wrapped it with ace wrap, and elevated in on pillow. She has pain this morning on medial side of ankle. She can bear weight.   Physical Exam: Vitals:   09/12/22 0836  BP: 119/76  Pulse: 90  SpO2: 94%  Weight: 245 lb (111.1 kg)  Height: 5\' 7"  (1.702 m)     Physical Exam Constitutional:      General: She is not in acute distress.    Appearance: She is not ill-appearing.  Cardiovascular:     Rate and Rhythm: Normal rate and regular rhythm.     Heart sounds: No murmur heard. Abdominal:     General: Bowel sounds are normal.     Tenderness: There is no abdominal tenderness. There is no guarding.  Musculoskeletal:     Left ankle: No swelling, deformity or ecchymosis. Normal range of motion.     Left Achilles Tendon: Tenderness (bony tenderness at navicular) present.      Assessment & Plan:   Bijan was seen today for nausea and ankle injury.  Acute left ankle pain Assessment & Plan: Acute problem, uncomplicated Pain at navicular bone. Will send for xray to rule out fracture Continue RICE therapy.   Orders: -     DG Ankle Complete Left  Iron deficiency anemia due to chronic blood loss Assessment & Plan: Chronic problem, stable Patient is on iron supplement and reports this was started when she had diverticular bleed and iron deficiency anemia.  Review of her most recent  hemoglobin and January showed normal hemoglobin.  Will check iron studies see if patient can discontinue iron supplement.   Orders: -     Iron, TIBC and Ferritin Panel  Nausea Assessment & Plan: Acute problem, uncomplicated Will check CMP, lipase to screen for hepatobiliary or pancreatic cause of nausea.  Review of problem list show patient has severe GERD. Will increase Protonix to BID dosing for one month to see if this improves symptoms.     Orders: -     CMP14+EGFR -     Lipase  Gastroesophageal reflux disease, unspecified whether esophagitis present Overview: Dyspepsia-chronic  Assessment & Plan: Patient presents with nausea which I think could be related to her GERD.  Increased Protonix to BID for one month   Orders: -     Pantoprazole Sodium; Take 1 tablet (40 mg total) by mouth 2 (two) times daily.  Dispense: 90 tablet; Refill: 0      Milus Banister, MD

## 2022-09-13 LAB — CMP14+EGFR
ALT: 7 IU/L (ref 0–32)
AST: 10 IU/L (ref 0–40)
Albumin: 3.9 g/dL (ref 3.9–4.9)
Alkaline Phosphatase: 87 IU/L (ref 44–121)
BUN/Creatinine Ratio: 25 (ref 12–28)
BUN: 18 mg/dL (ref 8–27)
Bilirubin Total: <0.2 mg/dL (ref 0.0–1.2)
CO2: 23 mmol/L (ref 20–29)
Calcium: 9.8 mg/dL (ref 8.7–10.3)
Chloride: 105 mmol/L (ref 96–106)
Creatinine, Ser: 0.73 mg/dL (ref 0.57–1.00)
Globulin, Total: 2.3 g/dL (ref 1.5–4.5)
Glucose: 99 mg/dL (ref 70–99)
Potassium: 4 mmol/L (ref 3.5–5.2)
Sodium: 142 mmol/L (ref 134–144)
Total Protein: 6.2 g/dL (ref 6.0–8.5)
eGFR: 92 mL/min/{1.73_m2} (ref >59)

## 2022-09-13 LAB — IRON,TIBC AND FERRITIN PANEL
Ferritin: 541 ng/mL — ABNORMAL HIGH (ref 15–150)
Iron Saturation: 34 % (ref 15–55)
Iron: 79 ug/dL (ref 27–139)
Total Iron Binding Capacity: 235 ug/dL — ABNORMAL LOW (ref 250–450)
UIBC: 156 ug/dL (ref 118–369)

## 2022-09-13 LAB — LIPASE: Lipase: 31 U/L (ref 14–72)

## 2022-10-06 ENCOUNTER — Ambulatory Visit: Payer: 59 | Admitting: Family Medicine

## 2022-10-11 ENCOUNTER — Other Ambulatory Visit: Payer: Self-pay | Admitting: Family Medicine

## 2022-10-14 ENCOUNTER — Other Ambulatory Visit (HOSPITAL_COMMUNITY): Payer: 59

## 2022-10-18 ENCOUNTER — Other Ambulatory Visit: Payer: Self-pay | Admitting: Family Medicine

## 2022-11-02 ENCOUNTER — Other Ambulatory Visit: Payer: Self-pay | Admitting: Family Medicine

## 2022-11-02 DIAGNOSIS — K219 Gastro-esophageal reflux disease without esophagitis: Secondary | ICD-10-CM

## 2022-11-08 ENCOUNTER — Encounter (HOSPITAL_COMMUNITY): Payer: Self-pay | Admitting: Psychiatry

## 2022-11-08 ENCOUNTER — Telehealth (INDEPENDENT_AMBULATORY_CARE_PROVIDER_SITE_OTHER): Payer: 59 | Admitting: Psychiatry

## 2022-11-08 DIAGNOSIS — F331 Major depressive disorder, recurrent, moderate: Secondary | ICD-10-CM | POA: Diagnosis not present

## 2022-11-08 DIAGNOSIS — F411 Generalized anxiety disorder: Secondary | ICD-10-CM | POA: Diagnosis not present

## 2022-11-08 MED ORDER — ALPRAZOLAM 1 MG PO TABS: 1.0000 mg | ORAL_TABLET | Freq: Three times a day (TID) | ORAL | 3 refills | Status: DC

## 2022-11-08 MED ORDER — MIRTAZAPINE 15 MG PO TABS: 15.0000 mg | ORAL_TABLET | Freq: Every day | ORAL | 3 refills | Status: DC

## 2022-11-08 NOTE — Progress Notes (Signed)
Virtual Visit via Telephone Note  I connected with Jenna Woods on 11/08/22 at 11:20 AM EDT by telephone and verified that I am speaking with the correct person using two identifiers.  Location: Patient: home Provider: office   I discussed the limitations, risks, security and privacy concerns of performing an evaluation and management service by telephone and the availability of in person appointments. I also discussed with the patient that there may be a patient responsible charge related to this service. The patient expressed understanding and agreed to proceed.     I discussed the assessment and treatment plan with the patient. The patient was provided an opportunity to ask questions and all were answered. The patient agreed with the plan and demonstrated an understanding of the instructions.   The patient was advised to call back or seek an in-person evaluation if the symptoms worsen or if the condition fails to improve as anticipated.  I provided 15 minutes of non-face-to-face time during this encounter.   Jenna Ruder, MD  [facility] MD/PA/NP OP Progress Note  11/08/2022 11:28 AM [name]  MRN:  [id number]  Chief Complaint:  Chief Complaint  Patient presents with   Depression   Anxiety   Follow-up   HPI: This patient is a 62 year old divorced black female who lives alone in [place].  She is on disability.  The patient returns for follow-up regarding her depression and anxiety.  She states that she continues to do well.  She notes that her health has been good.  She is sleeping well and denies significant anxiety or depressive symptoms.  She denies thoughts of self-harm or suicide.  She is struggling with a neighbor who is older and may be is in an early stage of dementia.  She states that this neighbor bangs on the walls a lot and hollers and no one seems to be doing much about it. Visit Diagnosis:    ICD-10-CM   1. Major depressive disorder, recurrent episode,  moderate (HCC)  F33.1     2. Anxiety state  F41.1       Past Psychiatric History: none  Past Medical History:  Past Medical History:  Diagnosis Date   Allergic reaction    Allergy    perrenial    Anxiety    Arthritis    Depression    h/o suicidal ideation in 2011   Depression with anxiety    Diverticulosis of colon 12/19/2015   Diverticulosis of colon with hemorrhage 12/19/2015   GERD (gastroesophageal reflux disease)    Hypertension 1996   Obesity    Peripheral vascular disease (HCC)    Prediabetes 2013   Raynauds syndrome    Scleroderma (HCC) 1998   Systemic lupus erythematosus (HCC) 1998   Treated at [facility]   Tobacco abuse, in remission    10-pack-years discontinued in 2007   Urinary frequency     Past Surgical History:  Procedure Laterality Date   ABDOMINAL HYSTERECTOMY  1990   Neoplasm   CESAREAN SECTION     x2   COLONOSCOPY  2005   COLONOSCOPY WITH PROPOFOL N/A 08/28/2015   Dr. Karilyn Cota: scattered medium-mouth diverticula entire colon, external hemorrhoids   COLONOSCOPY WITH PROPOFOL N/A 07/27/2016   Procedure: COLONOSCOPY WITH PROPOFOL;  Surgeon: Malissa Hippo, MD;  [facility];  Service: Endoscopy;  Laterality: N/A;   FINGER AMPUTATION     Third finger, bilateral, distal   INCISIONAL HERNIA REPAIR N/A 01/09/2019   Procedure: HERNIA REPAIR INCISIONAL;  Surgeon:  Lucretia Roers, MD;  Location: [facility];  Service: General;  Laterality: N/A;   INSERTION OF MESH N/A 01/09/2019   Procedure: INSERTION OF MESH;  Surgeon: Lucretia Roers, MD;  Location: [facility];  Service: General;  Laterality: N/A;   NASAL SINUS SURGERY  09/06/2011   Procedure: ENDOSCOPIC SINUS SURGERY;  Surgeon: Darletta Moll, MD;  Location: [facility];  Service: ENT;  Laterality: N/A;  Nasal cerebrospinal fluid leak repair with Left temporalis fascia graft and Left Ear cartilage graft   VASCULAR SURGERY     Hands, bilaterally, [facility]; right hand partial  amputation of middle finger.    Family Psychiatric History: See below  Family History:  Family History  Problem Relation Age of Onset   Arthritis Mother        Rheumatoid   Dementia Mother    Hypertension Mother    Cirrhosis Father    Alcohol abuse Father    Arthritis Maternal Grandmother    Drug abuse Sister    Scleroderma Brother    Luiz Blare' disease Son    Colon cancer Neg Hx     Social History:  Social History   Socioeconomic History   Marital status: Single    Spouse name: Not on file   Number of children: 2   Years of education: 12   Highest education level: 12th grade  Occupational History   Occupation: Youth worker: [employer]    Comment: Disability awarded in 1998  Tobacco Use   Smoking status: Former    Current packs/day: 0.00    Average packs/day: 0.3 packs/day for 25.0 years (6.3 ttl pk-yrs)    Types: Cigarettes    Start date: 04/07/1976    Quit date: 04/07/2001    Years since quitting: 21.6   Smokeless tobacco: Never   Tobacco comments:    quit 88 days ago!!  Vaping Use   Vaping status: Never Used  Substance and Sexual Activity   Alcohol use: No    Alcohol/week: 0.0 standard drinks of alcohol    Comment: quit in 2004   Drug use: No    Comment: use to use pot    Sexual activity: Not Currently    Birth control/protection: Surgical    Comment: hyst  Other Topics Concern   Not on file  Social History Narrative   Currently living with son in [place], moving back soon    Social Determinants of Health   Financial Resource Strain: Low Risk  (07/07/2022)   Overall Financial Resource Strain (CARDIA)    Difficulty of Paying Living Expenses: Not hard at all  Food Insecurity: No Food Insecurity (07/07/2022)   Hunger Vital Sign    Worried About Running Out of Food in the Last Year: Never true    Ran Out of Food in the Last Year: Never true  Transportation Needs: No Transportation Needs (07/07/2022)   PRAPARE - Doctor, general practice (Medical): No    Lack of Transportation (Non-Medical): No  Physical Activity: Insufficiently Active (07/07/2022)   Exercise Vital Sign    Days of Exercise per Week: 3 days    Minutes of Exercise per Session: 40 min  Stress: No Stress Concern Present (07/07/2022)   [employer] of Occupational Health - Occupational Stress Questionnaire    Feeling of Stress : Only a little  Social Connections: Moderately Integrated (07/07/2022)   Social Connection and Isolation Panel [NHANES]    Frequency of Communication  with Friends and Family: More than three times a week    Frequency of Social Gatherings with Friends and Family: Three times a week    Attends Religious Services: More than 4 times per year    Active Member of Clubs or Organizations: Yes    Attends Banker Meetings: More than 4 times per year    Marital Status: Divorced    Allergies:  Allergies  Allergen Reactions   Bee Venom Anaphylaxis   Sesame Oil Anaphylaxis and Swelling    (Sesame seed)   Aspirin     Diverticulosis with bleed   Molds & Smuts    Zofran [Ondansetron] Itching    Generalized itching and had a rash on her thighs   Codeine Itching and Rash    Metabolic Disorder Labs: Lab Results  Component Value Date   HGBA1C 6.2 (H) 07/08/2022   MPG 123 04/01/2019   MPG 126 09/03/2018   No results found for: "PROLACTIN" Lab Results  Component Value Date   CHOL 137 12/28/2021   TRIG 92 12/28/2021   HDL 41 12/28/2021   CHOLHDL 3.3 12/28/2021   VLDL 15 06/30/2016   LDLCALC 79 12/28/2021   LDLCALC 77 12/24/2020   Lab Results  Component Value Date   TSH 1.460 12/28/2021   TSH 0.906 12/24/2020    Therapeutic Level Labs: No results found for: "LITHIUM" No results found for: "VALPROATE" No results found for: "CBMZ"  Current Medications: Current Outpatient Medications  Medication Sig Dispense Refill   ALPRAZolam (XANAX) 1 MG tablet Take 1 tablet (1 mg total) by mouth 3 (three) times  daily. 90 tablet 3   amLODipine (NORVASC) 10 MG tablet Take 1 tablet (10 mg total) by mouth daily. 90 tablet 3   Ascorbic Acid (VITAMIN C) 500 MG CAPS Take 500 mg by mouth daily.     cetirizine (ZYRTEC) 10 MG tablet TAKE 1 Tablet BY MOUTH ONCE DAILY 30 tablet 11   Cholecalciferol (VITAMIN D3 PO) Take by mouth.     ferrous sulfate 325 (65 FE) MG tablet Take 325 mg by mouth 1 day or 1 dose. 60 tablet 3   mirabegron ER (MYRBETRIQ) 50 MG TB24 tablet Take 1 tablet daily 30 tablet 11   mirtazapine (REMERON) 15 MG tablet Take 1 tablet (15 mg total) by mouth at bedtime. 30 tablet 3   montelukast (SINGULAIR) 10 MG tablet TAKE ONE TABLET BY MOUTH (10MG ) AT BEDTIME 30 tablet 6   pantoprazole (PROTONIX) 40 MG tablet take 1 Tablet by mouth twice daily 90 tablet 0   spironolactone (ALDACTONE) 25 MG tablet Take 1 Tablet by mouth once daily 30 tablet 3   tiZANidine (ZANAFLEX) 4 MG tablet Take 1 Tablet by mouth once daily AS NEEDED FOR MUSCLE SPASMS 30 tablet 1   No current facility-administered medications for this visit.     Musculoskeletal: Strength & Muscle Tone: na Gait & Station: na Patient leans: N/A  Psychiatric Specialty Exam: Review of Systems  Psychiatric/Behavioral:  Negative for hallucinations.   All other systems reviewed and are negative.   There were no vitals taken for this visit.There is no height or weight on file to calculate BMI.  General Appearance: NA  Eye Contact:  NA  Speech:  Clear and Coherent  Volume:  Normal  Mood:  Euthymic  Affect:  NA  Thought Process:  Goal Directed  Orientation:  Full (Time, Place, and Person)  Thought Content: WDL   Suicidal Thoughts:  No  Homicidal Thoughts:  No  Memory:  Immediate;   Good Recent;   Good Remote;   NA  Judgement:  Good  Insight:  Fair  Psychomotor Activity:  Normal  Concentration:  Concentration: Good and Attention Span: Good  Recall:  Good  Fund of Knowledge: Good  Language: Good  Akathisia:  No  Handed:  Right   AIMS (if indicated): not done  Assets:  Communication Skills Desire for Improvement Physical Health Resilience Social Support Talents/Skills  ADL's:  Intact  Cognition: WNL  Sleep:  Good   Screenings: GAD-7    Flowsheet Row Office Visit from 09/12/2022 in [facility] Office Visit from 07/08/2022 in [facility] Office Visit from 12/19/2019 in [facility] Office Visit from 06[facility]04/2019 in [facility] Phone Follow Up from 05/25/2017 in [facility]  Total GAD-7 Score 14 5 0 15 13      PHQ2-9    Flowsheet Row Office Visit from 09/12/2022 in [facility] Office Visit from 07/08/2022 in [facility] Clinical Support from 05/16/2022 in [facility] Office Visit from 04/01/2022 in [facility] Office Visit from 12/28/2021 in [facility]  PHQ-2 Total Score 4 2 0 0 1  PHQ-9 Total Score 10 6 -- 2 --      Flowsheet Row ED from 04/01/2022 in [facility] Emergency Department at [facility] Video Visit from 11/01/2021 in [facility] at [place] Video Visit from 08/06/2021 in [facility] at [place]  C-SSRS RISK CATEGORY No Risk No Risk No Risk        Assessment and Plan: This patient is a 62 year old female with history of depression anxiety and insomnia.  She continues to do well on her current regimen.  She will continue mirtazapine 15 mg at bedtime for depression and sleep and Xanax 1 mg 3 times daily as needed for anxiety.  She will return to see me in 4 months  Collaboration of Care: Collaboration of Care: Primary Care Provider AEB notes are shared with PCP on the epic system  Patient/Guardian was advised Release of Information must be obtained prior to any  record release in order to collaborate their care with an outside provider. Patient/Guardian was advised if they have not already done so to contact the registration department to sign all necessary forms in order for [place] to release information regarding their care.   Consent: Patient/Guardian gives verbal consent for treatment and assignment of benefits for services provided during this visit. Patient/Guardian expressed understanding and agreed to proceed.    Jenna Ruder, MD 11/08/2022, 11:28 AM

## 2022-12-05 ENCOUNTER — Other Ambulatory Visit: Payer: Self-pay | Admitting: Family Medicine

## 2022-12-05 ENCOUNTER — Other Ambulatory Visit: Payer: Self-pay | Admitting: Obstetrics & Gynecology

## 2022-12-05 DIAGNOSIS — IMO0002 Reserved for concepts with insufficient information to code with codable children: Secondary | ICD-10-CM

## 2022-12-05 DIAGNOSIS — M329 Systemic lupus erythematosus, unspecified: Secondary | ICD-10-CM

## 2022-12-05 DIAGNOSIS — N3941 Urge incontinence: Secondary | ICD-10-CM

## 2022-12-05 DIAGNOSIS — K219 Gastro-esophageal reflux disease without esophagitis: Secondary | ICD-10-CM

## 2022-12-19 ENCOUNTER — Other Ambulatory Visit: Payer: Self-pay | Admitting: Family Medicine

## 2022-12-19 DIAGNOSIS — K219 Gastro-esophageal reflux disease without esophagitis: Secondary | ICD-10-CM

## 2022-12-30 ENCOUNTER — Ambulatory Visit (INDEPENDENT_AMBULATORY_CARE_PROVIDER_SITE_OTHER): Payer: 59 | Admitting: Family Medicine

## 2022-12-30 ENCOUNTER — Encounter: Payer: Self-pay | Admitting: Family Medicine

## 2022-12-30 VITALS — BP 121/83 | HR 110 | Ht 67.0 in | Wt 246.1 lb

## 2022-12-30 DIAGNOSIS — E559 Vitamin D deficiency, unspecified: Secondary | ICD-10-CM

## 2022-12-30 DIAGNOSIS — Z0001 Encounter for general adult medical examination with abnormal findings: Secondary | ICD-10-CM | POA: Diagnosis not present

## 2022-12-30 DIAGNOSIS — K219 Gastro-esophageal reflux disease without esophagitis: Secondary | ICD-10-CM

## 2022-12-30 DIAGNOSIS — Z13220 Encounter for screening for lipoid disorders: Secondary | ICD-10-CM

## 2022-12-30 DIAGNOSIS — R7302 Impaired glucose tolerance (oral): Secondary | ICD-10-CM

## 2022-12-30 DIAGNOSIS — Z1231 Encounter for screening mammogram for malignant neoplasm of breast: Secondary | ICD-10-CM | POA: Diagnosis not present

## 2022-12-30 DIAGNOSIS — Z90710 Acquired absence of both cervix and uterus: Secondary | ICD-10-CM | POA: Diagnosis not present

## 2022-12-30 DIAGNOSIS — E041 Nontoxic single thyroid nodule: Secondary | ICD-10-CM | POA: Diagnosis not present

## 2022-12-30 DIAGNOSIS — I10 Essential (primary) hypertension: Secondary | ICD-10-CM

## 2022-12-30 DIAGNOSIS — D126 Benign neoplasm of colon, unspecified: Secondary | ICD-10-CM

## 2022-12-30 MED ORDER — PANTOPRAZOLE SODIUM 40 MG PO TBEC: 40.0000 mg | DELAYED_RELEASE_TABLET | Freq: Every day | ORAL | 1 refills | Status: DC

## 2022-12-30 MED ORDER — TIZANIDINE HCL 4 MG PO TABS: ORAL_TABLET | ORAL | 1 refills | Status: DC

## 2022-12-30 NOTE — Assessment & Plan Note (Addendum)
Annual exam as documented. . Immunization and cancer screening needs are specifically addressed at this visit.  

## 2022-12-30 NOTE — Progress Notes (Signed)
Jenna Woods     MRN: [id number]      DOB: [birth year]  Chief Complaint  Patient presents with   Annual Exam    CPE     HPI: Patient is in for annual physical exam. No other health concerns are expressed or addressed at the visit. Immunization is reviewed , and  are up to date.   PE: BP 121/83 (BP Location: Right Arm, Patient Position: Sitting, Cuff Size: Large)   Pulse (!) 110   Ht 5\' 7"  (1.702 m)   Wt 246 lb 1.9 oz (111.6 kg)   SpO2 94%   BMI 38.55 kg/m   Pleasant  female, alert and oriented x 3, in no cardio-pulmonary distress. Afebrile. HEENT No facial trauma or asymetry. Sinuses non tender.  Extra occullar muscles intact.. External ears normal, . Neck: supple, no adenopathy,JVD or thyromegaly.No bruits.  Chest: Clear to ascultation bilaterally.No crackles or wheezes. Non tender to palpation  Breast: Asymptomatic, not examined  Cardiovascular system; Heart sounds normal,  S1 and  S2 ,no S3.  No murmur, or thrill. Apical beat not displaced Peripheral pulses normal.  Abdomen: Soft, non tender   Musculoskeletal exam: Full ROM of spine, hips , shoulders and knees. No deformity ,swelling or crepitus noted. No muscle wasting or atrophy.   Neurologic: Cranial nerves 2 to 12 intact. Power, tone ,sensation  normal throughout. No disturbance in gait. No tremor.  Skin: Intact, no ulceration, erythema , scaling or rash noted. Pigmentation normal throughout  Psych; Normal mood and affect. Judgement and concentration normal   Assessment & Plan:  Tubular adenoma of colon Should need f/u in 5 to 7 years, no known f/h of colon cancer,  GI to follow  Encounter for Medicare annual examination with abnormal findings Annual exam as documented. . Immunization and cancer screening needs are specifically addressed at this visit.

## 2022-12-30 NOTE — Patient Instructions (Addendum)
F/U in 4.5 months, call if you need me sooner  Pls schedule mammogram at [facility] center due in May at checkout   Reduce pantoprazole to once daily  Nurse pls document Covid vaccine  CBC, lipid, cmp and EGFr, hBA1C, TSH and Vit D today   Ontinue great health habit of water and daily exercise   Think about what you will eat, plan ahead. Choose " clean, green, fresh or frozen" over canned, processed or packaged foods which are more sugary, salty and fatty. 70 to 75% of food eaten should be vegetables and fruit. Three meals at set times with snacks allowed between meals, but they must be fruit or vegetables. Aim to eat over a 12 hour period , example 7 am to 7 pm, and STOP after  your last meal of the day. Drink water,generally about 64 ounces per day, no other drink is as healthy. Fruit juice is best enjoyed in a healthy way, by EATING the fruit. Thanks for choosing [facility], we consider it a privelige to serve you.

## 2022-12-30 NOTE — Assessment & Plan Note (Signed)
Should need f/u in 5 to 7 years, no known f/h of colon cancer,  GI to follow

## 2022-12-31 LAB — CMP14+EGFR
ALT: 11 [IU]/L (ref 0–32)
AST: 13 [IU]/L (ref 0–40)
Albumin: 4.2 g/dL (ref 3.9–4.9)
Alkaline Phosphatase: 88 [IU]/L (ref 44–121)
BUN/Creatinine Ratio: 15 (ref 12–28)
BUN: 12 mg/dL (ref 8–27)
Bilirubin Total: <0.2 mg/dL (ref 0.0–1.2)
CO2: 23 mmol/L (ref 20–29)
Calcium: 9.6 mg/dL (ref 8.7–10.3)
Chloride: 103 mmol/L (ref 96–106)
Creatinine, Ser: 0.78 mg/dL (ref 0.57–1.00)
Globulin, Total: 2.7 g/dL (ref 1.5–4.5)
Glucose: 93 mg/dL (ref 70–99)
Potassium: 4.2 mmol/L (ref 3.5–5.2)
Sodium: 142 mmol/L (ref 134–144)
Total Protein: 6.9 g/dL (ref 6.0–8.5)
eGFR: 85 mL/min/{1.73_m2} (ref >59)

## 2022-12-31 LAB — CBC
Hematocrit: 44.7 % (ref 34.0–46.6)
Hemoglobin: 14.5 g/dL (ref 11.1–15.9)
MCH: 30.7 pg (ref 26.6–33.0)
MCHC: 32.4 g/dL (ref 31.5–35.7)
MCV: 95 fL (ref 79–97)
Platelets: 375 10*3/uL (ref 150–450)
RBC: 4.73 x10E6/uL (ref 3.77–5.28)
RDW: 11.7 % (ref 11.7–15.4)
WBC: 10.3 10*3/uL (ref 3.4–10.8)

## 2022-12-31 LAB — LIPID PANEL
Chol/HDL Ratio: 2.9 ratio (ref 0.0–4.4)
Cholesterol, Total: 132 mg/dL (ref 100–199)
HDL: 45 mg/dL (ref >39)
LDL Chol Calc (NIH): 67 mg/dL (ref 0–99)
Triglycerides: 112 mg/dL (ref 0–149)
VLDL Cholesterol Cal: 20 mg/dL (ref 5–40)

## 2022-12-31 LAB — TSH: TSH: 1.16 u[IU]/mL (ref 0.450–4.500)

## 2022-12-31 LAB — HEMOGLOBIN A1C
Est. average glucose Bld gHb Est-mCnc: 134 mg/dL
Hgb A1c MFr Bld: 6.3 % — ABNORMAL HIGH (ref 4.8–5.6)

## 2022-12-31 LAB — VITAMIN D 25 HYDROXY (VIT D DEFICIENCY, FRACTURES): Vit D, 25-Hydroxy: 73.1 ng/mL (ref 30.0–100.0)

## 2023-01-24 ENCOUNTER — Telehealth (HOSPITAL_COMMUNITY): Payer: Self-pay | Admitting: *Deleted

## 2023-01-24 NOTE — Telephone Encounter (Signed)
[facility] called stating that she had a root canal surgery and was given the Hydrocodone-Acetaminophen 10mg -325mg  due to the major pain she was having. Per pt she just wanted staff to inform provider with this information.

## 2023-01-24 NOTE — Telephone Encounter (Signed)
Per Adaly from [facility] called stating patient came into pharmacy with a hard copy of a pain medication and this medication is not suppose to be token with the medication Dr. Tenny Craw has her on. The pain medication name is Hydrocodone-Acep 10mg -325mg . The medication that Dr. Tenny Craw has her on that's not suppose to be token with this medication is Xanax 1mg . Per pharmacy, if provider don't mind her being on these two medications, they are needing an override from the Doctor. [phone].

## 2023-01-25 ENCOUNTER — Other Ambulatory Visit: Payer: Self-pay | Admitting: Family Medicine

## 2023-02-01 ENCOUNTER — Telehealth: Payer: Self-pay

## 2023-02-01 ENCOUNTER — Other Ambulatory Visit (HOSPITAL_COMMUNITY): Payer: Self-pay | Admitting: Psychiatry

## 2023-02-01 NOTE — Telephone Encounter (Signed)
Copied from CRM [id number]. Topic: Clinical - Medication Refill >> Feb 01, 2023  2:28 PM Thomes Dinning wrote: Most Recent Primary Care Visit:  Provider: Syliva Overman E  Department: [facility]  Visit Type: PHYSICAL  Date: 12/30/2022  Medication: tiZANidine (ZANAFLEX) 4 MG tablet  Pharmacay has asked that a new script be submitted for tiZANidine (ZANAFLEX) 2MG  tablet  due to tiZANidine (ZANAFLEX) 4mg  being on back order  Has the patient contacted their pharmacy? Yes (Agent: If no, request that the patient contact the pharmacy for the refill. If patient does not wish to contact the pharmacy document the reason why and proceed with request.) (Agent: If yes, when and what did the pharmacy advise?)  Is this the correct pharmacy for this prescription? Yes If no, delete pharmacy and type the correct one.  This is the patient's preferred pharmacy:  [facility] - [place], [place] - [address] [place] [address] Phone: [phone] Fax: [phone]   Has the prescription been filled recently? No  Is the patient out of the medication? Yes  Has the patient been seen for an appointment in the last year OR does the patient have an upcoming appointment? No  Can we respond through MyChart? Yes  Agent: Please be advised that Rx refills may take up to 3 business days. We ask that you follow-up with your pharmacy.

## 2023-02-03 ENCOUNTER — Other Ambulatory Visit (HOSPITAL_COMMUNITY): Payer: Self-pay | Admitting: Psychiatry

## 2023-02-06 MED ORDER — TIZANIDINE HCL 2 MG PO TABS: ORAL_TABLET | ORAL | 2 refills | Status: DC

## 2023-02-06 NOTE — Addendum Note (Signed)
Addended by: Syliva Overman E on: 02/06/2023 11:10 PM   Modules accepted: Orders

## 2023-03-02 ENCOUNTER — Other Ambulatory Visit (HOSPITAL_COMMUNITY): Payer: Self-pay | Admitting: Psychiatry

## 2023-03-03 ENCOUNTER — Telehealth: Payer: Self-pay

## 2023-03-03 NOTE — Telephone Encounter (Signed)
Patient called and wanted to speak with a nurse, would not state why, just that it is a female issue.

## 2023-03-10 ENCOUNTER — Ambulatory Visit: Payer: 59 | Admitting: Obstetrics & Gynecology

## 2023-03-10 ENCOUNTER — Telehealth (HOSPITAL_COMMUNITY): Payer: 59 | Admitting: Psychiatry

## 2023-03-10 ENCOUNTER — Encounter (HOSPITAL_COMMUNITY): Payer: Self-pay | Admitting: Psychiatry

## 2023-03-10 DIAGNOSIS — F411 Generalized anxiety disorder: Secondary | ICD-10-CM

## 2023-03-10 DIAGNOSIS — F331 Major depressive disorder, recurrent, moderate: Secondary | ICD-10-CM

## 2023-03-10 DIAGNOSIS — F5105 Insomnia due to other mental disorder: Secondary | ICD-10-CM | POA: Diagnosis not present

## 2023-03-10 MED ORDER — ALPRAZOLAM 1 MG PO TABS: 1.0000 mg | ORAL_TABLET | Freq: Three times a day (TID) | ORAL | 3 refills | Status: DC

## 2023-03-10 MED ORDER — MIRTAZAPINE 15 MG PO TABS: 15.0000 mg | ORAL_TABLET | Freq: Every day | ORAL | 3 refills | Status: DC

## 2023-03-10 NOTE — Progress Notes (Signed)
 Virtual Visit via Video Note  I connected with Jenna Woods on 03/10/23 at 11:20 AM EST by a video enabled telemedicine application and verified that I am speaking with the correct person using two identifiers.  Location: Patient: home Provider: office   I discussed the limitations of evaluation and management by telemedicine and the availability of in person appointments. The patient expressed understanding and agreed to proceed.     I discussed the assessment and treatment plan with the patient. The patient was provided an opportunity to ask questions and all were answered. The patient agreed with the plan and demonstrated an understanding of the instructions.   The patient was advised to call back or seek an in-person evaluation if the symptoms worsen or if the condition fails to improve as anticipated.  I provided 20 minutes of non-face-to-face time during this encounter.   Barnie Gull, MD  [facility] MD/PA/NP OP Progress Note  03/10/2023 11:35 AM Jenna Woods  MRN:  [id number]  Chief Complaint:  Chief Complaint  Patient presents with   Anxiety   Follow-up   HPI: This patient is a 63 year old divorced black female who lives alone in [place]. She is on disability.   The patient returns for follow-up regarding her depression and anxiety.  She states that she had a rough time in November and December because she had a bad tooth and went through a dental procedure that did not work out well.  She was in a lot of pain.  She finally had the tooth pulled and now she is feeling much better.  She denies significant depression anxiety thoughts of self-harm or difficulty sleeping. Visit Diagnosis:    ICD-10-CM   1. Major depressive disorder, recurrent episode, moderate (HCC)  F33.1     2. Anxiety state  F41.1     3. Insomnia due to mental disorder  F51.05       Past Psychiatric History: none  Past Medical History:  Past Medical History:  Diagnosis Date   Allergic  reaction    Allergy    perrenial    Anxiety    Arthritis    Depression    h/o suicidal ideation in 2011   Depression with anxiety    Diverticulosis of colon 12/19/2015   Diverticulosis of colon with hemorrhage 12/19/2015   GERD (gastroesophageal reflux disease)    Hypertension 1996   Obesity    Peripheral vascular disease (HCC)    Prediabetes 2013   Raynauds syndrome    Scleroderma (HCC) 1998   Systemic lupus erythematosus (HCC) 1998   Treated at [facility]   Tobacco abuse, in remission    10-pack-years discontinued in 2007   Urinary frequency     Past Surgical History:  Procedure Laterality Date   ABDOMINAL HYSTERECTOMY  1990   Neoplasm   CESAREAN SECTION     x2   COLONOSCOPY  2005   COLONOSCOPY WITH PROPOFOL  N/A 08/28/2015   Dr. Golda: scattered medium-mouth diverticula entire colon, external hemorrhoids   COLONOSCOPY WITH PROPOFOL  N/A 07/27/2016   Procedure: COLONOSCOPY WITH PROPOFOL ;  Surgeon: Golda Claudis PENNER, MD;  Location: [facility];  Service: Endoscopy;  Laterality: N/A;   FINGER AMPUTATION     Third finger, bilateral, distal   INCISIONAL HERNIA REPAIR N/A 01/09/2019   Procedure: HERNIA REPAIR INCISIONAL;  Surgeon: Kallie Manuelita BROCKS, MD;  Location: [facility];  Service: General;  Laterality: N/A;   INSERTION OF MESH N/A 01/09/2019   Procedure: INSERTION OF MESH;  Surgeon: Kallie,  Manuelita BROCKS, MD;  Location: [facility];  Service: General;  Laterality: N/A;   NASAL SINUS SURGERY  09/06/2011   Procedure: ENDOSCOPIC SINUS SURGERY;  Surgeon: Ana LELON Moccasin, MD;  Location: [facility];  Service: ENT;  Laterality: N/A;  Nasal cerebrospinal fluid leak repair with Left temporalis fascia graft and Left Ear cartilage graft   VASCULAR SURGERY     Hands, bilaterally, [facility]; right hand partial amputation of middle finger.    Family Psychiatric History: see below  Family History:  Family History  Problem Relation Age of Onset   Arthritis Mother        Rheumatoid    Dementia Mother    Hypertension Mother    Cirrhosis Father    Alcohol abuse Father    Arthritis Maternal Grandmother    Drug abuse Sister    Scleroderma Brother    Yvone' disease Son    Colon cancer Neg Hx     Social History:  Social History   Socioeconomic History   Marital status: Single    Spouse name: Not on file   Number of children: 2   Years of education: 12   Highest education level: 12th grade  Occupational History   Occupation: Youth Worker: [employer]    Comment: Disability awarded in 1998  Tobacco Use   Smoking status: Former    Current packs/day: 0.00    Average packs/day: 0.3 packs/day for 25.0 years (6.3 ttl pk-yrs)    Types: Cigarettes    Start date: 04/07/1976    Quit date: 04/07/2001    Years since quitting: 21.9   Smokeless tobacco: Never   Tobacco comments:    quit 88 days ago!!  Vaping Use   Vaping status: Never Used  Substance and Sexual Activity   Alcohol use: No    Alcohol/week: 0.0 standard drinks of alcohol    Comment: quit in 2004   Drug use: No    Comment: use to use pot    Sexual activity: Not Currently    Birth control/protection: Surgical    Comment: hyst  Other Topics Concern   Not on file  Social History Narrative   Currently living with son in [place], moving back soon    Social Drivers of Health   Financial Resource Strain: Low Risk  (07/07/2022)   Overall Financial Resource Strain (CARDIA)    Difficulty of Paying Living Expenses: Not hard at all  Food Insecurity: No Food Insecurity (07/07/2022)   Hunger Vital Sign    Worried About Running Out of Food in the Last Year: Never true    Ran Out of Food in the Last Year: Never true  Transportation Needs: No Transportation Needs (07/07/2022)   PRAPARE - Administrator, Civil Service (Medical): No    Lack of Transportation (Non-Medical): No  Physical Activity: Insufficiently Active (07/07/2022)   Exercise Vital Sign    Days of Exercise per Week:  3 days    Minutes of Exercise per Session: 40 min  Stress: No Stress Concern Present (07/07/2022)   [employer] of Occupational Health - Occupational Stress Questionnaire    Feeling of Stress : Only a little  Social Connections: Moderately Integrated (07/07/2022)   Social Connection and Isolation Panel [NHANES]    Frequency of Communication with Friends and Family: More than three times a week    Frequency of Social Gatherings with Friends and Family: Three times a week    Attends Religious  Services: More than 4 times per year    Active Member of Clubs or Organizations: Yes    Attends Banker Meetings: More than 4 times per year    Marital Status: Divorced    Allergies:  Allergies  Allergen Reactions   Bee Venom Anaphylaxis   Sesame Oil Anaphylaxis and Swelling    (Sesame seed)   Aspirin      Diverticulosis with bleed   Molds & Smuts    Zofran  [Ondansetron ] Itching    Generalized itching and had a rash on her thighs   Codeine Itching and Rash    Metabolic Disorder Labs: Lab Results  Component Value Date   HGBA1C 6.3 (H) 12/30/2022   MPG 123 04/01/2019   MPG 126 09/03/2018   No results found for: PROLACTIN Lab Results  Component Value Date   CHOL 132 12/30/2022   TRIG 112 12/30/2022   HDL 45 12/30/2022   CHOLHDL 2.9 12/30/2022   VLDL 15 06/30/2016   LDLCALC 67 12/30/2022   LDLCALC 79 12/28/2021   Lab Results  Component Value Date   TSH 1.160 12/30/2022   TSH 1.460 12/28/2021    Therapeutic Level Labs: No results found for: LITHIUM No results found for: VALPROATE No results found for: CBMZ  Current Medications: Current Outpatient Medications  Medication Sig Dispense Refill   ALPRAZolam  (XANAX ) 1 MG tablet Take 1 tablet (1 mg total) by mouth 3 (three) times daily. 90 tablet 3   amLODipine  (NORVASC ) 10 MG tablet Take 1 tablet (10 mg total) by mouth daily. 90 tablet 3   Ascorbic Acid (VITAMIN C) 500 MG CAPS Take 500 mg by mouth daily.      cetirizine  (ZYRTEC ) 10 MG tablet TAKE 1 Tablet BY MOUTH ONCE DAILY 30 tablet 11   Cholecalciferol (VITAMIN D3 PO) Take by mouth.     ferrous sulfate  325 (65 FE) MG tablet Take 325 mg by mouth 1 day or 1 dose. 60 tablet 3   mirtazapine  (REMERON ) 15 MG tablet Take 1 tablet (15 mg total) by mouth at bedtime. 30 tablet 3   montelukast  (SINGULAIR ) 10 MG tablet TAKE ONE TABLET BY MOUTH (10MG ) AT BEDTIME 30 tablet 6   MYRBETRIQ  50 MG TB24 tablet TAKE 1 Tablet BY MOUTH ONCE DAILY 30 tablet 11   pantoprazole  (PROTONIX ) 40 MG tablet Take 1 tablet (40 mg total) by mouth daily. Dose reduction effective 12/30/2022 90 tablet 1   spironolactone  (ALDACTONE ) 25 MG tablet Take 1 Tablet by mouth once daily 30 tablet 3   tiZANidine  (ZANAFLEX ) 2 MG tablet Take two tablets by mouth once daily, as needed for muscle spasm 60 tablet 2   No current facility-administered medications for this visit.     Musculoskeletal: Strength & Muscle Tone: within normal limits Gait & Station: normal Patient leans: N/A  Psychiatric Specialty Exam: Review of Systems  All other systems reviewed and are negative.   There were no vitals taken for this visit.There is no height or weight on file to calculate BMI.  General Appearance: Casual and Fairly Groomed  Eye Contact:  Good  Speech:  Clear and Coherent  Volume:  Normal  Mood:  Euthymic  Affect:  Congruent  Thought Process:  Goal Directed  Orientation:  Full (Time, Place, and Person)  Thought Content: WDL   Suicidal Thoughts:  No  Homicidal Thoughts:  No  Memory:  Immediate;   Good Recent;   Good Remote;   NA  Judgement:  Good  Insight:  Fair  Psychomotor Activity:  Normal  Concentration:  Concentration: Good and Attention Span: Good  Recall:  Good  Fund of Knowledge: Good  Language: Good  Akathisia:  No  Handed:  Right  AIMS (if indicated): not done  Assets:  Communication Skills Desire for Improvement Physical Health Resilience Social Support  ADL's:   Intact  Cognition: WNL  Sleep:  Good   Screenings: GAD-7    Flowsheet Row Office Visit from 12/30/2022 in [facility] Office Visit from 09/12/2022 in [facility] Office Visit from 07/08/2022 in [facility] Office Visit from 12/19/2019 in [facility] Office Visit from 06/04/2019 in [facility]  Total GAD-7 Score 1 14 5  0 15      PHQ2-9    Flowsheet Row Office Visit from 12/30/2022 in [facility] Office Visit from 09/12/2022 in [facility] Office Visit from 07/08/2022 in [facility] Clinical Support from 05/16/2022 in [facility] Office Visit from 04/01/2022 in [facility]  PHQ-2 Total Score 0 4 2 0 0  PHQ-9 Total Score -- 10 6 -- 2      Flowsheet Row ED from 04/01/2022 in [facility] Emergency Department at [facility] Video Visit from 11/01/2021 in [facility] at [place] Video Visit from 08/06/2021 in [facility] at [place]  C-SSRS RISK CATEGORY No Risk No Risk No Risk        Assessment and Plan: This patient is a 63 year old female with a history of depression anxiety and insomnia.  She continues to do well on her current regimen.  She will continue mirtazapine  15 mg at bedtime for depression and sleep and Xanax  1 mg 3 times daily as needed for anxiety.  She will return to see me in 4 months  Collaboration of Care: Collaboration of Care: Primary Care Provider AEB notes are shared with PCP on the epic system  Patient/Guardian was advised Release of Information must be obtained prior to any record release in order to collaborate their care with an outside provider. Patient/Guardian was advised if they have not already done so to contact the registration department to sign  all necessary forms in order for us  to release information regarding their care.   Consent: Patient/Guardian gives verbal consent for treatment and assignment of benefits for services provided during this visit. Patient/Guardian expressed understanding and agreed to proceed.    Barnie Gull, MD 03/10/2023, 11:35 AM

## 2023-04-27 ENCOUNTER — Other Ambulatory Visit: Payer: Self-pay | Admitting: Family Medicine

## 2023-05-12 ENCOUNTER — Other Ambulatory Visit: Payer: Self-pay | Admitting: Family Medicine

## 2023-05-12 ENCOUNTER — Ambulatory Visit: Payer: 59 | Admitting: Family Medicine

## 2023-05-12 ENCOUNTER — Encounter: Payer: Self-pay | Admitting: Family Medicine

## 2023-05-12 VITALS — BP 120/83 | HR 94 | Resp 16 | Ht 67.0 in | Wt 248.0 lb

## 2023-05-12 DIAGNOSIS — Z780 Asymptomatic menopausal state: Secondary | ICD-10-CM

## 2023-05-12 DIAGNOSIS — R9431 Abnormal electrocardiogram [ECG] [EKG]: Secondary | ICD-10-CM

## 2023-05-12 DIAGNOSIS — R0789 Other chest pain: Secondary | ICD-10-CM | POA: Diagnosis not present

## 2023-05-12 DIAGNOSIS — K219 Gastro-esophageal reflux disease without esophagitis: Secondary | ICD-10-CM | POA: Diagnosis not present

## 2023-05-12 DIAGNOSIS — Z6838 Body mass index (BMI) 38.0-38.9, adult: Secondary | ICD-10-CM

## 2023-05-12 DIAGNOSIS — I10 Essential (primary) hypertension: Secondary | ICD-10-CM

## 2023-05-12 DIAGNOSIS — R7302 Impaired glucose tolerance (oral): Secondary | ICD-10-CM

## 2023-05-12 DIAGNOSIS — N39498 Other specified urinary incontinence: Secondary | ICD-10-CM

## 2023-05-12 DIAGNOSIS — E8881 Metabolic syndrome: Secondary | ICD-10-CM

## 2023-05-12 NOTE — Assessment & Plan Note (Signed)
 Patient educated about the importance of limiting  Carbohydrate intake , the need to commit to daily physical activity for a minimum of 30 minutes , and to commit weight loss. The fact that changes in all these areas will reduce or eliminate all together the development of diabetes is stressed.      Latest Ref Rng & Units 12/30/2022   11:22 AM 09/12/2022    9:16 AM 07/08/2022    1:44 PM 04/01/2022    6:02 PM 12/28/2021    1:55 PM  Diabetic Labs  HbA1c 4.8 - 5.6 % 6.3   6.2   6.0   Chol 100 - 199 mg/dL 409     811   HDL >91 mg/dL 45     41   Calc LDL 0 - 99 mg/dL 67     79   Triglycerides 0 - 149 mg/dL 478     92   Creatinine 0.57 - 1.00 mg/dL 2.95  6.21   3.08  6.57       05/12/2023    9:56 AM 12/30/2022   10:00 AM 09/12/2022    8:36 AM 07/08/2022    1:03 PM 04/02/2022   12:00 AM 04/01/2022   11:30 PM 04/01/2022   10:36 PM  BP/Weight  Systolic BP 120 121 119 124  103 98  Diastolic BP 83 83 76 85  86 75  Wt. (Lbs) 248 246.12 245 245.12 242.07    BMI 38.84 kg/m2 38.55 kg/m2 38.37 kg/m2 39.56 kg/m2 39.07 kg/m2         No data to display           Updated lab needed at/ before next visit.

## 2023-05-12 NOTE — Patient Instructions (Addendum)
 F/U in 3 months, call if you need me sooner  Please schedule dexa in [place] , Jenna Woods at [employer] , chem 7  and eGFR 3 to 7 days before next appointment  EKG in office today, referred  to Cardiology,   It is important that you exercise regularly at least 30 minutes 5 times a week. If you develop chest pain, have severe difficulty breathing, or feel very tired, stop exercising immediately and seek medical attention   Think about what you will eat, plan ahead. Choose " clean, green, fresh or frozen" over canned, processed or packaged foods which are more sugary, salty and fatty. 70 to 75% of food eaten should be vegetables and fruit. Three meals at set times with snacks allowed between meals, but they must be fruit or vegetables. Aim to eat over a 12 hour period , example 7 am to 7 pm, and STOP after  your last meal of the day. Drink water,generally about 64 ounces per day, no other drink is as healthy. Fruit juice is best enjoyed in a healthy way, by EATING the fruit.  Excellent labs in October, except you are prediabetic Thanks for choosing [facility], we consider it a privelige to serve you.

## 2023-05-12 NOTE — Assessment & Plan Note (Signed)
 Controlled, no change in medication DASH diet and commitment to daily physical activity for a minimum of 30 minutes discussed and encouraged, as a part of hypertension management. The importance of attaining a healthy weight is also discussed.     05/12/2023    9:56 AM 12/30/2022   10:00 AM 09/12/2022    8:36 AM 07/08/2022    1:03 PM 04/02/2022   12:00 AM 04/01/2022   11:30 PM 04/01/2022   10:36 PM  BP/Weight  Systolic BP 120 121 119 124  103 98  Diastolic BP 83 83 76 85  86 75  Wt. (Lbs) 248 246.12 245 245.12 242.07    BMI 38.84 kg/m2 38.55 kg/m2 38.37 kg/m2 39.56 kg/m2 39.07 kg/m2

## 2023-05-15 ENCOUNTER — Other Ambulatory Visit: Payer: Self-pay | Admitting: Family Medicine

## 2023-05-15 DIAGNOSIS — K219 Gastro-esophageal reflux disease without esophagitis: Secondary | ICD-10-CM

## 2023-05-15 DIAGNOSIS — E8881 Metabolic syndrome: Secondary | ICD-10-CM | POA: Insufficient documentation

## 2023-05-15 NOTE — Assessment & Plan Note (Signed)
 The increased risk of cardiovascular disease associated with this diagnosis, and the need to consistently work on lifestyle to change this is discussed. Following  a  heart healthy diet ,commitment to 30 minutes of exercise at least 5 days per week, as well as control of blood sugar and cholesterol , and achieving a healthy weight are all the areas to be addressed .

## 2023-05-15 NOTE — Assessment & Plan Note (Signed)
 Intermittent episodes associated with pounding in ears at times. At increased risk for CAD, EKG, non specific t abnormality, refer Cardiology

## 2023-05-15 NOTE — Assessment & Plan Note (Signed)
 Controlled, no change in medication

## 2023-05-15 NOTE — Assessment & Plan Note (Signed)
  Patient re-educated about  the importance of commitment to a  minimum of 150 minutes of exercise per week as able.  The importance of healthy food choices with portion control discussed, as well as eating regularly and within a 12 hour window most days. The need to choose "clean , green" food 50 to 75% of the time is discussed, as well as to make water the primary drink and set a goal of 64 ounces water daily.       05/12/2023    9:56 AM 12/30/2022   10:00 AM 09/12/2022    8:36 AM  Weight /BMI  Weight 248 lb 246 lb 1.9 oz 245 lb  Height 5\' 7"  (1.702 m) 5\' 7"  (1.702 m) 5\' 7"  (1.702 m)  BMI 38.84 kg/m2 38.55 kg/m2 38.37 kg/m2    unchnaged

## 2023-05-15 NOTE — Assessment & Plan Note (Signed)
 Refer to cardiology for assessment, at increased risk of CAD  based on co morbidities

## 2023-05-15 NOTE — Progress Notes (Signed)
 Jenna Woods     MRN: [id number]      DOB: 1960-03-29  Chief Complaint  Patient presents with   Hypertension    4.5 mth follow up    HPI Jenna Woods is here for follow up and re-evaluation of chronic medical conditions, medication management and review of any available recent lab and radiology data.  Preventive health is updated, specifically  Cancer screening and Immunization.   C/o intermittent episodes of chest discomfort in past 3 months, no specific trigger, also reports ponding in left ear and some left upper extrmity discomfort at times   ROS Denies recent fever or chills. Denies sinus pressure, nasal congestion, ear pain or sore throat. Denies chest congestion, productive cough or wheezing.  Denies abdominal pain, nausea, vomiting,diarrhea or constipation.   Denies dysuria, frequency, hesitancy or incontinence. Denies joint pain, swelling and limitation in mobility. Denies headaches, seizures, numbness, or tingling. Denies depression, anxiety or insomnia. Denies skin break down or rash.   PE  BP 120/83   Pulse 94   Resp 16   Ht 5\' 7"  (1.702 m)   Wt 248 lb (112.5 kg)   SpO2 94%   BMI 38.84 kg/m   Patient alert and oriented and in no cardiopulmonary distress.  HEENT: No facial asymmetry, EOMI,     Neck supple .  Chest: Clear to auscultation bilaterally.Non tender  CVS: S1, S2 no murmurs, no S3.Regular rate. EKG: non specific t abn ABD: Soft non tender.   Ext: No edema  MS: Adequate ROM spine, shoulders, hips and knees.  Skin: Intact, no ulcerations or rash noted.  Psych: Good eye contact, normal affect. Memory intact not anxious or depressed appearing.  CNS: CN 2-12 intact, power,  normal throughout.no focal deficits noted.   Assessment & Plan  IGT (impaired glucose tolerance) Patient educated about the importance of limiting  Carbohydrate intake , the need to commit to daily physical activity for a minimum of 30 minutes , and to commit  weight loss. The fact that changes in all these areas will reduce or eliminate all together the development of diabetes is stressed.      Latest Ref Rng & Units 12/30/2022   11:22 AM 09/12/2022    9:16 AM 07/08/2022    1:44 PM 04/01/2022    6:02 PM 12/28/2021    1:55 PM  Diabetic Labs  HbA1c 4.8 - 5.6 % 6.3   6.2   6.0   Chol 100 - 199 mg/dL 409     811   HDL >91 mg/dL 45     41   Calc LDL 0 - 99 mg/dL 67     79   Triglycerides 0 - 149 mg/dL 478     92   Creatinine 0.57 - 1.00 mg/dL 2.95  6.21   3.08  6.57       05/12/2023    9:56 AM 12/30/2022   10:00 AM 09/12/2022    8:36 AM 07/08/2022    1:03 PM 04/02/2022   12:00 AM 04/01/2022   11:30 PM 04/01/2022   10:36 PM  BP/Weight  Systolic BP 120 121 119 124  103 98  Diastolic BP 83 83 76 85  86 75  Wt. (Lbs) 248 246.12 245 245.12 242.07    BMI 38.84 kg/m2 38.55 kg/m2 38.37 kg/m2 39.56 kg/m2 39.07 kg/m2         No data to display           Updated lab needed at/  before next visit.   Essential hypertension Controlled, no change in medication DASH diet and commitment to daily physical activity for a minimum of 30 minutes discussed and encouraged, as a part of hypertension management. The importance of attaining a healthy weight is also discussed.     05/12/2023    9:56 AM 12/30/2022   10:00 AM 09/12/2022    8:36 AM 07/08/2022    1:03 PM 04/02/2022   12:00 AM 04/01/2022   11:30 PM 04/01/2022   10:36 PM  BP/Weight  Systolic BP 120 121 119 124  103 98  Diastolic BP 83 83 76 85  86 75  Wt. (Lbs) 248 246.12 245 245.12 242.07    BMI 38.84 kg/m2 38.55 kg/m2 38.37 kg/m2 39.56 kg/m2 39.07 kg/m2         Abnormal EKG Refer to cardiology for assessment, at increased risk of CAD  based on co morbidities  Morbid obesity (HCC)  Patient re-educated about  the importance of commitment to a  minimum of 150 minutes of exercise per week as able.  The importance of healthy food choices with portion control discussed, as well as eating  regularly and within a 12 hour window most days. The need to choose "clean , green" food 50 to 75% of the time is discussed, as well as to make water the primary drink and set a goal of 64 ounces water daily.       05/12/2023    9:56 AM 12/30/2022   10:00 AM 09/12/2022    8:36 AM  Weight /BMI  Weight 248 lb 246 lb 1.9 oz 245 lb  Height 5\' 7"  (1.702 m) 5\' 7"  (1.702 m) 5\' 7"  (1.702 m)  BMI 38.84 kg/m2 38.55 kg/m2 38.37 kg/m2    unchnaged  Urinary incontinence Controlled  on medication  Chest discomfort Intermittent episodes associated with pounding in ears at times. At increased risk for CAD, EKG, non specific t abnormality, refer Cardiology  Metabolic syndrome X The increased risk of cardiovascular disease associated with this diagnosis, and the need to consistently work on lifestyle to change this is discussed. Following  a  heart healthy diet ,commitment to 30 minutes of exercise at least 5 days per week, as well as control of blood sugar and cholesterol , and achieving a healthy weight are all the areas to be addressed .   GERD (gastroesophageal reflux disease) Controlled, no change in medication

## 2023-05-15 NOTE — Assessment & Plan Note (Signed)
 Controlled on medication

## 2023-05-22 ENCOUNTER — Ambulatory Visit (INDEPENDENT_AMBULATORY_CARE_PROVIDER_SITE_OTHER): Payer: 59

## 2023-05-22 VITALS — BP 120/80 | Ht 66.0 in | Wt 246.0 lb

## 2023-05-22 DIAGNOSIS — Z0000 Encounter for general adult medical examination without abnormal findings: Secondary | ICD-10-CM | POA: Diagnosis not present

## 2023-05-22 NOTE — Progress Notes (Signed)
 Because this visit was a virtual/telehealth visit,  certain criteria was not obtained, such a blood pressure, CBG if applicable, and timed get up and go. Any medications not marked as "taking" were not mentioned during the medication reconciliation part of the visit. Any vitals not documented were not able to be obtained due to this being a telehealth visit or patient was unable to self-report a recent blood pressure reading due to a lack of equipment at home via telehealth. Vitals that have been documented are verbally provided by the patient.   Subjective:   Jenna Woods is a 63 y.o. who presents for a Medicare Wellness preventive visit.  Visit Complete: Virtual I connected with  Jenna Woods on 05/22/23 by a audio enabled telemedicine application and verified that I am speaking with the correct person using two identifiers.  Patient Location: Home  Provider Location: Home Office  I discussed the limitations of evaluation and management by telemedicine. The patient expressed understanding and agreed to proceed.  Vital Signs: Because this visit was a virtual/telehealth visit, some criteria may be missing or patient reported. Any vitals not documented were not able to be obtained and vitals that have been documented are patient reported.  VideoDeclined- This patient declined Librarian, academic. Therefore the visit was completed with audio only.  Persons Participating in Visit: Patient.  AWV Questionnaire: Yes: Patient Medicare AWV questionnaire was completed by the patient on 05/21/2023; I have confirmed that all information answered by patient is correct and no changes since this date.  Cardiac Risk Factors include: advanced age (>84men, >73 women);hypertension;obesity (BMI >30kg/m2)     Objective:    Today's Vitals   05/22/23 0910  BP: 120/80  Weight: 246 lb (111.6 kg)  Height: 5\' 6"  (1.676 m)   Body mass index is 39.71 kg/m.     05/22/2023     9:15 AM 05/16/2022   10:28 AM 05/10/2021   10:34 AM 05/07/2020    1:19 PM 01/04/2019    3:02 PM 04/23/2018    4:18 PM 04/17/2017    3:25 PM  Advanced Directives  Does Patient Have a Medical Advance Directive? No Yes Yes Yes No No Yes  Type of Furniture conservator/restorer Living will;Healthcare Power of [employer] Power of [place];Living will     Does patient want to make changes to medical advance directive?  No - Patient declined No - Patient declined    No - Patient declined  Copy of Healthcare Power of Attorney in Chart?  No - copy requested No - copy requested No - copy requested     Would patient like information on creating a medical advance directive? No - Patient declined No - Patient declined   Yes (MAU/Ambulatory/Procedural Areas - Information given) Yes (MAU/Ambulatory/Procedural Areas - Information given)     Current Medications (verified) Outpatient Encounter Medications as of 05/22/2023  Medication Sig   ALPRAZolam (XANAX) 1 MG tablet Take 1 tablet (1 mg total) by mouth 3 (three) times daily.   amLODipine (NORVASC) 10 MG tablet Take 1 tablet (10 mg total) by mouth daily.   Ascorbic Acid (VITAMIN C) 500 MG CAPS Take 500 mg by mouth daily.   cetirizine (ZYRTEC) 10 MG tablet TAKE 1 Tablet BY MOUTH ONCE DAILY   Cholecalciferol (VITAMIN D3 PO) Take by mouth.   ferrous sulfate 325 (65 FE) MG tablet Take 325 mg by mouth 1 day or 1 dose.   mirtazapine (REMERON) 15 MG tablet  Take 1 tablet (15 mg total) by mouth at bedtime.   montelukast (SINGULAIR) 10 MG tablet TAKE ONE TABLET BY MOUTH (10MG ) AT BEDTIME   MYRBETRIQ 50 MG TB24 tablet TAKE 1 Tablet BY MOUTH ONCE DAILY   pantoprazole (PROTONIX) 40 MG tablet TAKE 1 Tablet BY MOUTH ONCE DAILY   spironolactone (ALDACTONE) 25 MG tablet Take 1 Tablet by mouth once daily   tiZANidine (ZANAFLEX) 2 MG tablet Take two tablets by mouth once daily, as needed for muscle spasm   No facility-administered encounter  medications on file as of 05/22/2023.    Allergies (verified) Bee venom, Sesame oil, Aspirin, Molds & smuts, Zofran [ondansetron], and Codeine   History: Past Medical History:  Diagnosis Date   Allergic reaction    Allergy    perrenial    Anxiety    Arthritis    Depression    h/o suicidal ideation in 2011   Depression with anxiety    Diverticulosis of colon 12/19/2015   Diverticulosis of colon with hemorrhage 12/19/2015   GERD (gastroesophageal reflux disease)    Hypertension 1996   Obesity    Peripheral vascular disease (HCC)    Prediabetes 2013   Raynauds syndrome    Scleroderma (HCC) 1998   Systemic lupus erythematosus (HCC) 1998   T[facility] at [facility]   Tobacco abuse, in remission    10-pack-years discontinued in 2007   Ulcer    Urinary frequency    Past Surgical History:  Procedure Laterality Date   ABDOMINAL HYSTERECTOMY  1990   Neoplasm   CESAREAN SECTION     x2   COLONOSCOPY  2005   COLONOSCOPY WITH PROPOFOL N/A 08/28/2015   Dr. Karilyn Cota: scattered medium-mouth diverticula entire colon, external hemorrhoids   COLONOSCOPY WITH PROPOFOL N/A 07/27/2016   Procedure: COLONOSCOPY WITH PROPOFOL;  Surgeon: Malissa Hippo, MD;  Location: [facility];  Service: Endoscopy;  Laterality: N/A;   FINGER AMPUTATION     Third finger, bilateral, distal   HERNIA REPAIR     INCISIONAL HERNIA REPAIR N/A 01/09/2019   Procedure: HERNIA REPAIR INCISIONAL;  Surgeon: Lucretia Roers, MD;  Location: [facility];  Service: General;  Laterality: N/A;   INSERTION OF MESH N/A 01/09/2019   Procedure: INSERTION OF MESH;  Surgeon: Lucretia Roers, MD;  Location: [facility];  Service: General;  Laterality: N/A;   NASAL SINUS SURGERY  09/06/2011   Procedure: ENDOSCOPIC SINUS SURGERY;  Surgeon: Darletta Moll, MD;  Location: [facility];  Service: ENT;  Laterality: N/A;  Nasal cerebrospinal fluid leak repair with Left temporalis fascia graft and Left Ear cartilage graft   TUBAL LIGATION      VASCULAR SURGERY     Hands, bilaterally, [facility]; right hand partial amputation of middle finger.   Family History  Problem Relation Age of Onset   Arthritis Mother        Rheumatoid   Dementia Mother    Hypertension Mother    Cirrhosis Father    Alcohol abuse Father    Arthritis Maternal Grandmother    Drug abuse Sister    Scleroderma Brother    Luiz Blare' disease Son    Colon cancer Neg Hx    Social History   Socioeconomic History   Marital status: Single    Spouse name: Not on file   Number of children: 2   Years of education: 12   Highest education level: Some college, no degree  Occupational History   Occupation: Futures trader  Employer: [employer]    Comment: Disability awarded in 1998  Tobacco Use   Smoking status: Former    Current packs/day: 0.00    Average packs/day: 0.3 packs/day for 25.0 years (6.3 ttl pk-yrs)    Types: Cigarettes    Start date: 04/07/1976    Quit date: 04/07/2001    Years since quitting: 22.1   Smokeless tobacco: Never   Tobacco comments:    quit 88 days ago!!  Vaping Use   Vaping status: Never Used  Substance and Sexual Activity   Alcohol use: No    Comment: quit in 2004   Drug use: No    Comment: use to use pot    Sexual activity: Not Currently    Birth control/protection: Surgical    Comment: hyst  Other Topics Concern   Not on file  Social History Narrative   Currently living with son in [place], moving back soon    Social Drivers of Health   Financial Resource Strain: Low Risk  (05/22/2023)   Overall Financial Resource Strain (CARDIA)    Difficulty of Paying Living Expenses: Not hard at all  Food Insecurity: No Food Insecurity (05/22/2023)   Hunger Vital Sign    Worried About Running Out of Food in the Last Year: Never true    Ran Out of Food in the Last Year: Never true  Transportation Needs: No Transportation Needs (05/22/2023)   PRAPARE - Administrator, Civil Service (Medical): No     Lack of Transportation (Non-Medical): No  Physical Activity: Sufficiently Active (05/22/2023)   Exercise Vital Sign    Days of Exercise per Week: 7 days    Minutes of Exercise per Session: 30 min  Recent Concern: Physical Activity - Insufficiently Active (05/08/2023)   Exercise Vital Sign    Days of Exercise per Week: 3 days    Minutes of Exercise per Session: 30 min  Stress: No Stress Concern Present (05/22/2023)   [employer] of Occupational Health - Occupational Stress Questionnaire    Feeling of Stress : Not at all  Recent Concern: Stress - Stress Concern Present (05/08/2023)   [employer] of Occupational Health - Occupational Stress Questionnaire    Feeling of Stress : To some extent  Social Connections: Moderately Integrated (05/22/2023)   Social Connection and Isolation Panel [NHANES]    Frequency of Communication with Friends and Family: More than three times a week    Frequency of Social Gatherings with Friends and Family: More than three times a week    Attends Religious Services: More than 4 times per year    Active Member of [employer] or Organizations: Yes    Attends Engineer, structural: More than 4 times per year    Marital Status: Divorced    Tobacco Counseling Counseling given: Yes Tobacco comments: quit 88 days ago!!    Clinical Intake:  Pre-visit preparation completed: Yes  Pain : No/denies pain     BMI - recorded: 39.71 Nutritional Status: BMI > 30  Obese Nutritional Risks: None Diabetes: No  How often do you need to have someone help you when you read instructions, pamphlets, or other written materials from your doctor or pharmacy?: 1 - Never  Interpreter Needed?: No  Information entered by :: Maryjean Ka CMA   Activities of Daily Living     05/21/2023   10:41 AM  In your present state of health, do you have any difficulty performing the following activities:  Hearing? 0  Vision? 0  Difficulty concentrating or making decisions? 0   Walking or climbing stairs? 0  Dressing or bathing? 0  Doing errands, shopping? 0  Preparing Food and eating ? N  Using the Toilet? N  In the past six months, have you accidently leaked urine? N  Do you have problems with loss of bowel control? N  Managing your Medications? N  Managing your Finances? N  Housekeeping or managing your Housekeeping? N    Patient Care Team: Kerri Perches, MD as PCP - General Tenny Craw Mellody Drown, MD as Consulting Physician [facility]) Sallye Lat, MD as Consulting Physician (Ophthalmology) Marguerita Merles, Reuel Boom, MD as Consulting Physician (Gastroenterology) Lazaro Arms, MD as Consulting Physician (Obstetrics and Gynecology) Myna Hidalgo, DO as Consulting Physician (Obstetrics and Gynecology)  Indicate any recent Medical Services you may have received from other than [facility] providers in the past year (date may be approximate).     Assessment:   This is a routine wellness examination for Payzlee.  Hearing/Vision screen Hearing Screening - Comments:: Patient denies any hearing difficulties.   Vision Screening - Comments:: Patient wears reading glasses only. Up to date with yearly exams.  Patient sees Dr. Sallye Lat in [place]    Goals Addressed             This Visit's Progress    Patient Stated       Spend more time with my grandchildren       Depression Screen     05/22/2023    9:17 AM 05/12/2023   10:01 AM 12/30/2022   10:56 AM 09/12/2022    8:37 AM 07/08/2022    1:07 PM 05/16/2022   10:29 AM 05/16/2022   10:27 AM  PHQ 2/9 Scores  PHQ - 2 Score 0 0 0 4 2 0 0  PHQ- 9 Score 0   10 6      Fall Risk     05/21/2023   10:41 AM 05/12/2023   10:01 AM 09/12/2022    8:37 AM 07/08/2022    1:07 PM 05/16/2022   10:28 AM  Fall Risk   Falls in the past year? 0 0 0 0 0  Number falls in past yr: 0 0 0 0 0  Injury with Fall? 0 0 0 0 0  Risk for fall due to : No Fall Risks  No Fall Risks No Fall Risks No Fall Risks   Follow up Falls prevention discussed;Falls evaluation completed Falls evaluation completed Falls evaluation completed Falls evaluation completed Falls evaluation completed    MEDICARE RISK AT HOME:  Medicare Risk at Home Any stairs in or around the home?: (Patient-Rptd) No If so, are there any without handrails?: No Home free of loose throw rugs in walkways, pet beds, electrical cords, etc?: (Patient-Rptd) Yes Adequate lighting in your home to reduce risk of falls?: (Patient-Rptd) Yes Life alert?: (Patient-Rptd) No Use of a cane, walker or w/c?: (Patient-Rptd) No Grab bars in the bathroom?: (Patient-Rptd) No Shower chair or bench in shower?: (Patient-Rptd) No Elevated toilet seat or a handicapped toilet?: (Patient-Rptd) No  TIMED UP AND GO:  Was the test performed?  No  Cognitive Function: 6CIT completed    05/16/2022   10:29 AM  MMSE - Mini Mental State Exam  Not completed: Unable to complete        05/22/2023    9:12 AM 05/16/2022   10:29 AM 05/10/2021   10:32 AM 05/07/2019    1:27 PM 04/23/2018  4:30 PM  6CIT Screen  What Year? 0 points 0 points 0 points 0 points 0 points  What month? 0 points 0 points 0 points 0 points 0 points  What time? 0 points 0 points 0 points 0 points 0 points  Count back from 20 0 points 0 points 0 points 0 points 0 points  Months in reverse 0 points 0 points 0 points 0 points 0 points  Repeat phrase 0 points 0 points 0 points 0 points 0 points  Total Score 0 points 0 points 0 points 0 points 0 points    Immunizations Immunization History  Administered Date(s) Administered   Fluad Quad(high Dose 65+) 11/10/2020   Influenza Inj Mdck Quad Pf 11/28/2018   Influenza Split 01/10/2012   Influenza Whole 12/04/2009, 12/29/2010   Influenza,inj,Quad PF,6+ Mos 11/29/2012, 10/28/2013, 11/18/2014, 12/15/2015, 12/20/2016, 12/07/2017, 11/28/2018, 12/05/2019, 12/07/2021, 12/05/2022   Moderna Covid-19 Vaccine Bivalent Booster 71yrs & up 12/07/2021    Moderna SARS-COV2 Booster Vaccination 11/19/2020   Moderna Sars-Covid-2 Vaccination 05/18/2019, 06/19/2019, 11/07/2019, 08/26/2020   PNEUMOCOCCAL CONJUGATE-20 12/24/2020   PPD Test 11/09/2011   Pneumococcal Conjugate-13 08/24/2020   Respiratory Syncytial Virus Vaccine,Recomb Aduvanted(Arexvy) 11/03/2021   Tdap 12/29/2010, 03/11/2022   Zoster Recombinant(Shingrix) 01/24/2019, 04/01/2019    Screening Tests Health Maintenance  Topic Date Due   COVID-19 Vaccine (7 - 2024-25 season) 11/06/2022   DEXA SCAN  Never done   Medicare Annual Wellness (AWV)  05/21/2024   MAMMOGRAM  07/18/2024   Colonoscopy  01/09/2028   DTaP/Tdap/Td (3 - Td or Tdap) 03/11/2032   Pneumonia Vaccine 35+ Years old  Completed   INFLUENZA VACCINE  Completed   Hepatitis C Screening  Completed   HIV Screening  Completed   Zoster Vaccines- Shingrix  Completed   HPV VACCINES  Aged Out    Health Maintenance  Health Maintenance Due  Topic Date Due   COVID-19 Vaccine (7 - 2024-25 season) 11/06/2022   DEXA SCAN  Never done   Health Maintenance Items Addressed: Health Maintenance is up to date.   Additional Screening:  Vision Screening: Recommended annual ophthalmology exams for early detection of glaucoma and other disorders of the eye.  Dental Screening: Recommended annual dental exams for proper oral hygiene  Community Resource Referral / Chronic Care Management: CRR required this visit?  No   CCM required this visit?  No     Plan:     I have personally reviewed and noted the following in the patient's chart:   Medical and social history Use of alcohol, tobacco or illicit drugs  Current medications and supplements including opioid prescriptions. Patient is not currently taking opioid prescriptions. Functional ability and status Nutritional status Physical activity Advanced directives List of other physicians Hospitalizations, surgeries, and ER visits in previous 12 months Vitals Screenings to  include cognitive, depression, and falls Referrals and appointments  In addition, I have reviewed and discussed with patient certain preventive protocols, quality metrics, and best practice recommendations. A written personalized care plan for preventive services as well as general preventive health recommendations were provided to patient.     Jordan Hawks Savas Elvin, CMA   05/22/2023   After Visit Summary: (MyChart) Due to this being a telephonic visit, the after visit summary with patients personalized plan was offered to patient via MyChart   Notes: Nothing significant to report at this time.

## 2023-05-22 NOTE — Patient Instructions (Signed)
 [name] , Thank you for taking time to come for your Medicare Wellness Visit. I appreciate your ongoing commitment to your health goals. Please review the following plan we discussed and let me know if I can assist you in the future.   Referrals/Orders/Follow-Ups/Clinician Recommendations:  Next Medicare Annual Wellness Visit:   May 22, 2024 at 9:20 am video visit.     This is a list of the screening recommended for you and due dates:  Health Maintenance  Topic Date Due   COVID-19 Vaccine (7 - 2024-25 season) 11/06/2022   DEXA scan (bone density measurement)  Never done   Medicare Annual Wellness Visit  05/21/2024   Mammogram  07/18/2024   Colon Cancer Screening  01/09/2028   DTaP/Tdap/Td vaccine (3 - Td or Tdap) 03/11/2032   Pneumonia Vaccine  Completed   Flu Shot  Completed   Hepatitis C Screening  Completed   HIV Screening  Completed   Zoster (Shingles) Vaccine  Completed   HPV Vaccine  Aged Out    Advanced directives: (Declined) Advance directive discussed with you today. Even though you declined this today, please call our office should you change your mind, and we can give you the proper paperwork for you to fill out.  Next Medicare Annual Wellness Visit scheduled for next year: yes  Understanding Your Risk for Falls Millions of people have serious injuries from falls each year. It is important to understand your risk of falling. Talk with your health care provider about your risk and what you can do to lower it. If you do have a serious fall, make sure to tell your provider. Falling once raises your risk of falling again. How can falls affect me? Serious injuries from falls are common. These include: Broken bones, such as hip fractures. Head injuries, such as traumatic brain injuries (TBI) or concussions. A fear of falling can cause you to avoid activities and stay at home. This can make your muscles weaker and raise your risk for a fall. What can increase my  risk? There are a number of risk factors that increase your risk for falling. The more risk factors you have, the higher your risk of falling. Serious injuries from a fall happen most often to people who are older than 65 years old. Teenagers and young adults ages 18-29 are also at higher risk. Common risk factors include: Weakness in the lower body. Being generally weak or confused due to long-term (chronic) illness. Dizziness or balance problems. Poor vision. Medicines that cause dizziness or drowsiness. These may include: Medicines for your blood pressure, heart, anxiety, insomnia, or swelling (edema). Pain medicines. Muscle relaxants. Other risk factors include: Drinking alcohol. Having had a fall in the past. Having foot pain or wearing improper footwear. Working at a dangerous job. Having any of the following in your home: Tripping hazards, such as floor clutter or loose rugs. Poor lighting. Pets. Having dementia or memory loss. What actions can I take to lower my risk of falling?     Physical activity Stay physically fit. Do strength and balance exercises. Consider taking a regular class to build strength and balance. Yoga and tai chi are good options. Vision Have your eyes checked every year and your prescription for glasses or contacts updated as needed. Shoes and walking aids Wear non-skid shoes. Wear shoes that have rubber soles and low heels. Do not wear high heels. Do not walk around the house in socks or slippers. Use a cane or walker as told  by your provider. Home safety Attach secure railings on both sides of your stairs. Install grab bars for your bathtub, shower, and toilet. Use a non-skid mat in your bathtub or shower. Attach bath mats securely with double-sided, non-slip rug tape. Use good lighting in all rooms. Keep a flashlight near your bed. Make sure there is a clear path from your bed to the bathroom. Use night-lights. Do not use throw rugs. Make sure  all carpeting is taped or tacked down securely. Remove all clutter from walkways and stairways, including extension cords. Repair uneven or broken steps and floors. Avoid walking on icy or slippery surfaces. Walk on the grass instead of on icy or slick sidewalks. Use ice melter to get rid of ice on walkways in the winter. Use a cordless phone. Questions to ask your health care provider Can you help me check my risk for a fall? Do any of my medicines make me more likely to fall? Should I take a vitamin D supplement? What exercises can I do to improve my strength and balance? Should I make an appointment to have my vision checked? Do I need a bone density test to check for weak bones (osteoporosis)? Would it help to use a cane or a walker? Where to find more information Centers for Disease Control and Prevention, STEADI: TonerPromos.no Community-Based Fall Prevention Programs: TonerPromos.no [employer] on Aging: BaseRingTones.pl Contact a health care provider if: You fall at home. You are afraid of falling at home. You feel weak, drowsy, or dizzy. This information is not intended to replace advice given to you by your health care provider. Make sure you discuss any questions you have with your health care provider. Document Revised: 10/25/2021 Document Reviewed: 10/25/2021 Elsevier Patient Education  2024 [employer].

## 2023-06-19 ENCOUNTER — Other Ambulatory Visit (HOSPITAL_COMMUNITY): Payer: Self-pay | Admitting: Psychiatry

## 2023-07-03 ENCOUNTER — Encounter (HOSPITAL_COMMUNITY): Payer: Self-pay | Admitting: Psychiatry

## 2023-07-03 ENCOUNTER — Telehealth (INDEPENDENT_AMBULATORY_CARE_PROVIDER_SITE_OTHER): Payer: 59 | Admitting: Psychiatry

## 2023-07-03 DIAGNOSIS — F5105 Insomnia due to other mental disorder: Secondary | ICD-10-CM | POA: Diagnosis not present

## 2023-07-03 DIAGNOSIS — F331 Major depressive disorder, recurrent, moderate: Secondary | ICD-10-CM

## 2023-07-03 DIAGNOSIS — F411 Generalized anxiety disorder: Secondary | ICD-10-CM | POA: Diagnosis not present

## 2023-07-03 MED ORDER — MIRTAZAPINE 15 MG PO TABS: 15.0000 mg | ORAL_TABLET | Freq: Every day | ORAL | 3 refills | Status: DC

## 2023-07-03 MED ORDER — ALPRAZOLAM 1 MG PO TABS: 1.0000 mg | ORAL_TABLET | Freq: Three times a day (TID) | ORAL | 3 refills | Status: DC

## 2023-07-03 NOTE — Progress Notes (Signed)
 Virtual Visit via Telephone Note  I connected with Jenna Woods on 07/03/23 at 11:20 AM EDT by telephone and verified that I am speaking with the correct person using two identifiers.  Location: Patient: home Provider: office   I discussed the limitations, risks, security and privacy concerns of performing an evaluation and management service by telephone and the availability of in person appointments. I also discussed with the patient that there may be a patient responsible charge related to this service. The patient expressed understanding and agreed to proceed.      I discussed the assessment and treatment plan with the patient. The patient was provided an opportunity to ask questions and all were answered. The patient agreed with the plan and demonstrated an understanding of the instructions.   The patient was advised to call back or seek an in-person evaluation if the symptoms worsen or if the condition fails to improve as anticipated.  I provided 20 minutes of non-face-to-face time during this encounter.   Alfredia Annas, MD  [facility] MD/PA/NP OP Progress Note  07/03/2023 11:40 AM Jenna Woods  MRN:  [id number]  Chief Complaint:  Chief Complaint  Patient presents with   Anxiety   Follow-up   HPI: This patient is a 63 year old divorced white female who lives alone in [place].  She is on disability.  The patient returns for follow-up after 3 months regarding her depression and anxiety.  She claims that she had a "meltdown."  This occurred last week after her daughter-in-law did not bring her 28-year-old granddaughter over as promised.  This is a second time that the grand daughter was not brought over.  She was crying and very upset and talk to her sisters.  She did take some extra Xanax  which helped.  She went to church over the weekend and has been eating and sleeping okay.  She still very upset and hurt and disappointed.  She states that she is not going to have much to  do with her sons are there children or girlfriends or wives,  I think this is a good idea for the present.  She denies any thoughts of suicide or self-harm.  I suggested getting back into therapy at least temporarily and she is in agreement Visit Diagnosis:    ICD-10-CM   1. Major depressive disorder, recurrent episode, moderate (HCC)  F33.1     2. Anxiety state  F41.1     3. Insomnia due to mental disorder  F51.05       Past Psychiatric History: none  Past Medical History:  Past Medical History:  Diagnosis Date   Allergic reaction    Allergy    perrenial    Anxiety    Arthritis    Depression    h/o suicidal ideation in 2011   Depression with anxiety    Diverticulosis of colon 12/19/2015   Diverticulosis of colon with hemorrhage 12/19/2015   GERD (gastroesophageal reflux disease)    Hypertension 1996   Obesity    Peripheral vascular disease (HCC)    Prediabetes 2013   Raynauds syndrome    Scleroderma (HCC) 1998   Systemic lupus erythematosus (HCC) 1998   Treated at [facility]   Tobacco abuse, in remission    10-pack-years discontinued in 2007   Ulcer    Urinary frequency     Past Surgical History:  Procedure Laterality Date   ABDOMINAL HYSTERECTOMY  1990   Neoplasm   CESAREAN SECTION     x2   COLONOSCOPY  2005   COLONOSCOPY WITH PROPOFOL  N/A 08/28/2015   Dr. Homero Luster: scattered medium-mouth diverticula entire colon, external hemorrhoids   COLONOSCOPY WITH PROPOFOL  N/A 07/27/2016   Procedure: COLONOSCOPY WITH PROPOFOL ;  Surgeon: Ruby Corporal, MD;  [facility];  Service: Endoscopy;  Laterality: N/A;   FINGER AMPUTATION     Third finger, bilateral, distal   HERNIA REPAIR     INCISIONAL HERNIA REPAIR N/A 01/09/2019   Procedure: HERNIA REPAIR INCISIONAL;  Surgeon: Awilda Bogus, MD;  Location: [facility];  Service: General;  Laterality: N/A;   INSERTION OF MESH N/A 01/09/2019   Procedure: INSERTION OF MESH;  Surgeon: Awilda Bogus, MD;  Location:  [facility];  Service: General;  Laterality: N/A;   NASAL SINUS SURGERY  09/06/2011   Procedure: ENDOSCOPIC SINUS SURGERY;  Surgeon: Lawence Press, MD;  Location: [facility];  Service: ENT;  Laterality: N/A;  Nasal cerebrospinal fluid leak repair with Left temporalis fascia graft and Left Ear cartilage graft   TUBAL LIGATION     VASCULAR SURGERY     Hands, bilaterally, [facility]; right hand partial amputation of middle finger.    Family Psychiatric History: See below  Family History:  Family History  Problem Relation Age of Onset   Arthritis Mother        Rheumatoid   Dementia Mother    Hypertension Mother    Cirrhosis Father    Alcohol abuse Father    Arthritis Maternal Grandmother    Drug abuse Sister    Scleroderma Brother    Murrell Arrant' disease Son    Colon cancer Neg Hx     Social History:  Social History   Socioeconomic History   Marital status: Single    Spouse name: Not on file   Number of children: 2   Years of education: 12   Highest education level: Some college, no degree  Occupational History   Occupation: Youth worker: [employer]    Comment: Disability awarded in 1998  Tobacco Use   Smoking status: Former    Current packs/day: 0.00    Average packs/day: 0.3 packs/day for 25.0 years (6.3 ttl pk-yrs)    Types: Cigarettes    Start date: 04/07/1976    Quit date: 04/07/2001    Years since quitting: 22.2   Smokeless tobacco: Never   Tobacco comments:    quit 88 days ago!!  Vaping Use   Vaping status: Never Used  Substance and Sexual Activity   Alcohol use: No    Comment: quit in 2004   Drug use: No    Comment: use to use pot    Sexual activity: Not Currently    Birth control/protection: Surgical    Comment: hyst  Other Topics Concern   Not on file  Social History Narrative   Currently living with son in [place], moving back soon    Social Drivers of Health   Financial Resource Strain: Low Risk  (05/22/2023)    Overall Financial Resource Strain (CARDIA)    Difficulty of Paying Living Expenses: Not hard at all  Food Insecurity: No Food Insecurity (05/22/2023)   Hunger Vital Sign    Worried About Running Out of Food in the Last Year: Never true    Ran Out of Food in the Last Year: Never true  Transportation Needs: No Transportation Needs (05/22/2023)   PRAPARE - Administrator, Civil Service (Medical): No    Lack of Transportation (  Non-Medical): No  Physical Activity: Sufficiently Active (05/22/2023)   Exercise Vital Sign    Days of Exercise per Week: 7 days    Minutes of Exercise per Session: 30 min  Recent Concern: Physical Activity - Insufficiently Active (05/08/2023)   Exercise Vital Sign    Days of Exercise per Week: 3 days    Minutes of Exercise per Session: 30 min  Stress: No Stress Concern Present (05/22/2023)   [employer] of Occupational Health - Occupational Stress Questionnaire    Feeling of Stress : Not at all  Recent Concern: Stress - Stress Concern Present (05/08/2023)   [employer] of Occupational Health - Occupational Stress Questionnaire    Feeling of Stress : To some extent  Social Connections: Moderately Integrated (05/22/2023)   Social Connection and Isolation Panel [NHANES]    Frequency of Communication with Friends and Family: More than three times a week    Frequency of Social Gatherings with Friends and Family: More than three times a week    Attends Religious Services: More than 4 times per year    Active Member of [employer] or Organizations: Yes    Attends Engineer, structural: More than 4 times per year    Marital Status: Divorced    Allergies:  Allergies  Allergen Reactions   Bee Venom Anaphylaxis   Sesame Oil Anaphylaxis and Swelling    (Sesame seed)   Aspirin      Diverticulosis with bleed   Molds & Smuts    Zofran  Horizon.Haste ] Itching    Generalized itching and had a rash on her thighs   Codeine Itching and Rash    Metabolic  Disorder Labs: Lab Results  Component Value Date   HGBA1C 6.3 (H) 12/30/2022   MPG 123 04/01/2019   MPG 126 09/03/2018   No results found for: "PROLACTIN" Lab Results  Component Value Date   CHOL 132 12/30/2022   TRIG 112 12/30/2022   HDL 45 12/30/2022   CHOLHDL 2.9 12/30/2022   VLDL 15 06/30/2016   LDLCALC 67 12/30/2022   LDLCALC 79 12/28/2021   Lab Results  Component Value Date   TSH 1.160 12/30/2022   TSH 1.460 12/28/2021    Therapeutic Level Labs: No results found for: "LITHIUM" No results found for: "VALPROATE" No results found for: "CBMZ"  Current Medications: Current Outpatient Medications  Medication Sig Dispense Refill   ALPRAZolam  (XANAX ) 1 MG tablet Take 1 tablet (1 mg total) by mouth 3 (three) times daily. 90 tablet 3   amLODipine  (NORVASC ) 10 MG tablet Take 1 tablet (10 mg total) by mouth daily. 90 tablet 3   Ascorbic Acid (VITAMIN C) 500 MG CAPS Take 500 mg by mouth daily.     cetirizine  (ZYRTEC ) 10 MG tablet TAKE 1 Tablet BY MOUTH ONCE DAILY 30 tablet 11   Cholecalciferol (VITAMIN D3 PO) Take by mouth.     ferrous sulfate  325 (65 FE) MG tablet Take 325 mg by mouth 1 day or 1 dose. 60 tablet 3   mirtazapine  (REMERON ) 15 MG tablet Take 1 tablet (15 mg total) by mouth at bedtime. 30 tablet 3   montelukast  (SINGULAIR ) 10 MG tablet TAKE ONE TABLET BY MOUTH (10MG ) AT BEDTIME 30 tablet 6   MYRBETRIQ  50 MG TB24 tablet TAKE 1 Tablet BY MOUTH ONCE DAILY 30 tablet 11   pantoprazole  (PROTONIX ) 40 MG tablet TAKE 1 Tablet BY MOUTH ONCE DAILY 90 tablet 1   spironolactone  (ALDACTONE ) 25 MG tablet Take 1 Tablet by mouth once daily  30 tablet PRN   tiZANidine  (ZANAFLEX ) 2 MG tablet Take two tablets by mouth once daily, as needed for muscle spasm 60 tablet 2   No current facility-administered medications for this visit.     Musculoskeletal: Strength & Muscle Tone: within normal limits Gait & Station: normal Patient leans: N/A  Psychiatric Specialty Exam: Review of  Systems  Psychiatric/Behavioral:  Positive for dysphoric mood. The patient is nervous/anxious.   All other systems reviewed and are negative.   There were no vitals taken for this visit.There is no height or weight on file to calculate BMI.  General Appearance: Casual and Fairly Groomed  Eye Contact:  Good  Speech:  Clear and Coherent  Volume:  Normal  Mood:  Anxious  Affect:  Congruent  Thought Process:  Goal Directed  Orientation:  Full (Time, Place, and Person)  Thought Content: Rumination   Suicidal Thoughts:  No  Homicidal Thoughts:  No  Memory:  Immediate;   Good Recent;   Good Remote;   Good  Judgement:  Good  Insight:  Fair  Psychomotor Activity:  Normal  Concentration:  Concentration: Good and Attention Span: Good  Recall:  Good  Fund of Knowledge: Good  Language: Good  Akathisia:  No  Handed:  Right  AIMS (if indicated): not done  Assets:  Communication Skills Desire for Improvement Physical Health Resilience Social Support  ADL's:  Intact  Cognition: WNL  Sleep:  Good   Screenings: GAD-7    Flowsheet Row Office Visit from 12/30/2022 in [facility] Office Visit from 09/12/2022 in [facility] Office Visit from 07/08/2022 in [facility] Office Visit from 12/19/2019 in [facility] Office Visit from 06/04/2019 in [facility]  Total GAD-7 Score 1 14 5  0 15      PHQ2-9    Flowsheet Row Clinical Support from 05/22/2023 in [facility] [place] [facility] Office Visit from 05/12/2023 in [facility] Office Visit from 12/30/2022 in [facility] Office Visit from 09/12/2022 in [facility] Office Visit from 07/08/2022 in [facility]  PHQ-2 Total Score 0 0 0 4 2  PHQ-9 Total Score 0 -- -- 10 6      Flowsheet Row ED from 04/01/2022 in [facility] at [facility] Video Visit from 11/01/2021 in [facility] [place] Video Visit from 08/06/2021 in [facility] at [place]  C-SSRS RISK CATEGORY No Risk No Risk No Risk        Assessment and Plan: This patient is a 63 year old female with a history of depression anxiety and insomnia.  She is stressed right now because of family issues but does still feel that her medications are effective.  She will continue mirtazapine  15 mg at bedtime for depression and sleep and Xanax  1 mg 3 times daily for anxiety.  She will return to see me in 2 months  Collaboration of Care: Collaboration of Care: Referral or follow-up with counselor/therapist AEB patient will be scheduled to see Fayne Hoover in our office for therapy  Patient/Guardian was advised Release of Information must be obtained prior to any record release in order to collaborate their care with an outside provider. Patient/Guardian was advised if they have not already done so to contact the registration department to sign all necessary forms in order  for us  to release information regarding their care.   Consent: Patient/Guardian gives verbal consent for treatment and assignment of benefits for services provided during this visit. Patient/Guardian expressed understanding and agreed to proceed.    Alfredia Annas, MD 07/03/2023, 11:40 AM

## 2023-07-06 HISTORY — PX: CATARACT EXTRACTION, BILATERAL: SHX1313

## 2023-07-10 ENCOUNTER — Telehealth: Payer: Self-pay | Admitting: Family Medicine

## 2023-07-10 DIAGNOSIS — M329 Systemic lupus erythematosus, unspecified: Secondary | ICD-10-CM

## 2023-07-10 DIAGNOSIS — M359 Systemic involvement of connective tissue, unspecified: Secondary | ICD-10-CM

## 2023-07-10 DIAGNOSIS — Z8739 Personal history of other diseases of the musculoskeletal system and connective tissue: Secondary | ICD-10-CM

## 2023-07-10 NOTE — Telephone Encounter (Signed)
 Copied from CRM [id number]. Topic: Referral - Request for Referral >> Jul 10, 2023  8:03 AM Rosamond Comes wrote: Patient called referral rheumatologist   Did the patient discuss referral with their provider in the last year? Yes (If No - schedule appointment) (If Yes - send message)  Appointment offered? No  Type of order/referral and detailed reason for visit: rheumatologist    Preference of office, provider, location: Patient stated Dr Rodolph Clap has someone in mind. Patient would like something colser to home. Possibly [employer]?  If referral order, have you been seen by this specialty before? No (If Yes, this issue or another issue? When? Where?  Can we respond through MyChart? No

## 2023-07-12 DIAGNOSIS — H43813 Vitreous degeneration, bilateral: Secondary | ICD-10-CM | POA: Diagnosis not present

## 2023-07-12 DIAGNOSIS — H2513 Age-related nuclear cataract, bilateral: Secondary | ICD-10-CM | POA: Diagnosis not present

## 2023-07-13 NOTE — Telephone Encounter (Signed)
 Referred to dr Rodell Citrin

## 2023-07-14 DIAGNOSIS — H25811 Combined forms of age-related cataract, right eye: Secondary | ICD-10-CM | POA: Diagnosis not present

## 2023-07-14 NOTE — Telephone Encounter (Signed)
Pt informed

## 2023-07-17 ENCOUNTER — Other Ambulatory Visit: Payer: Self-pay | Admitting: Family Medicine

## 2023-07-20 NOTE — Progress Notes (Signed)
 Cardiology Office Note:    Date:  07/24/2023   ID:  Jenna Woods, DOB 1960/04/05, MRN [id number]  PCP:  Towanda Fret, MD   [facility] Providers Cardiologist:  None     Referring MD: Towanda Fret, MD   Chief Complaint  Patient presents with   Abnormal ECG    History of Present Illness:    Jenna Woods is a 63 y.o. female seen at the request of Dr Rodolph Clap for evaluation of abnormal Ecg. She has a history of HTN, obesity, Scleroderma, SLE, prediabetes, and remote tobacco use.   She states she feels great. She denies any chest pain, pressure, SOB, dizziness, palpitations or dizziness. Stays active with 4 grandchildren. Reports lupus is under good control.   Past Medical History:  Diagnosis Date   Allergic reaction    Allergy    perrenial    Anxiety    Arthritis    Depression    h/o suicidal ideation in 2011   Depression with anxiety    Diverticulosis of colon 12/19/2015   Diverticulosis of colon with hemorrhage 12/19/2015   GERD (gastroesophageal reflux disease)    Hypertension 1996   Obesity    Peripheral vascular disease (HCC)    Prediabetes 2013   Raynauds syndrome    Scleroderma (HCC) 1998   Systemic lupus erythematosus (HCC) 1998   Treated at [facility]   Tobacco abuse, in remission    10-pack-years discontinued in 2007   Ulcer    Urinary frequency     Past Surgical History:  Procedure Laterality Date   ABDOMINAL HYSTERECTOMY  1990   Neoplasm   CESAREAN SECTION     x2   COLONOSCOPY  2005   COLONOSCOPY WITH PROPOFOL  N/A 08/28/2015   Dr. Homero Luster: scattered medium-mouth diverticula entire colon, external hemorrhoids   COLONOSCOPY WITH PROPOFOL  N/A 07/27/2016   Procedure: COLONOSCOPY WITH PROPOFOL ;  Surgeon: Ruby Corporal, MD;  Location: [facility];  Service: Endoscopy;  Laterality: N/A;   FINGER AMPUTATION     Third finger, bilateral, distal   HERNIA REPAIR     INCISIONAL HERNIA REPAIR N/A 01/09/2019   Procedure:  HERNIA REPAIR INCISIONAL;  Surgeon: Awilda Bogus, MD;  Location: [facility];  Service: General;  Laterality: N/A;   INSERTION OF MESH N/A 01/09/2019   Procedure: INSERTION OF MESH;  Surgeon: Awilda Bogus, MD;  Location: [facility];  Service: General;  Laterality: N/A;   NASAL SINUS SURGERY  09/06/2011   Procedure: ENDOSCOPIC SINUS SURGERY;  Surgeon: Lawence Press, MD;  Location: [facility];  Service: ENT;  Laterality: N/A;  Nasal cerebrospinal fluid leak repair with Left temporalis fascia graft and Left Ear cartilage graft   TUBAL LIGATION     VASCULAR SURGERY     Hands, bilaterally, [facility]; right hand partial amputation of middle finger.    Current Medications: Current Meds  Medication Sig   ALPRAZolam  (XANAX ) 1 MG tablet Take 1 tablet (1 mg total) by mouth 3 (three) times daily.   amLODipine  (NORVASC ) 10 MG tablet TAKE 1 Tablet BY MOUTH ONCE DAILY   Ascorbic Acid (VITAMIN C) 500 MG CAPS Take 500 mg by mouth daily.   cetirizine  (ZYRTEC ) 10 MG tablet TAKE 1 Tablet BY MOUTH ONCE DAILY   Cholecalciferol (VITAMIN D3 PO) Take by mouth.   EPINEPHRINE , ANAPHYLAXIS, IJ Inject as directed.   ferrous sulfate  325 (65 FE) MG tablet Take 325 mg by mouth 1 day or 1 dose.  mirtazapine  (REMERON ) 15 MG tablet Take 1 tablet (15 mg total) by mouth at bedtime.   montelukast  (SINGULAIR ) 10 MG tablet TAKE ONE TABLET BY MOUTH (10MG ) AT BEDTIME   MYRBETRIQ  50 MG TB24 tablet TAKE 1 Tablet BY MOUTH ONCE DAILY   pantoprazole  (PROTONIX ) 40 MG tablet TAKE 1 Tablet BY MOUTH ONCE DAILY   spironolactone  (ALDACTONE ) 25 MG tablet Take 1 Tablet by mouth once daily   tiZANidine  (ZANAFLEX ) 2 MG tablet Take two tablets by mouth once daily, as needed for muscle spasm     Allergies:   Bee venom, Sesame oil, Aspirin , Molds & smuts, Zofran  [ondansetron ], and Codeine   Social History   Socioeconomic History   Marital status: Single    Spouse name: Not on file   Number of children: 2   Years of  education: 12   Highest education level: Some college, no degree  Occupational History   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  Vaping Use   Vaping status: Never Used  Substance and Sexual Activity   Alcohol use: No    Comment: quit in 2004   Drug use: No    Comment: use to use pot    Sexual activity: Not Currently    Birth control/protection: Surgical    Comment: hyst  Other Topics Concern   Not on file  Social History Narrative   4 grandchildren   Lives alone   Social Drivers of Health   Financial Resource Strain: Low Risk  (05/22/2023)   Overall Financial Resource Strain (CARDIA)    Difficulty of Paying Living Expenses: Not hard at all  Food Insecurity: No Food Insecurity (05/22/2023)   Hunger Vital Sign    Worried About Running Out of Food in the Last Year: Never true    Ran Out of Food in the Last Year: Never true  Transportation Needs: No Transportation Needs (05/22/2023)   PRAPARE - Administrator, Civil Service (Medical): No    Lack of Transportation (Non-Medical): No  Physical Activity: Sufficiently Active (05/22/2023)   Exercise Vital Sign    Days of Exercise per Week: 7 days    Minutes of Exercise per Session: 30 min  Recent Concern: Physical Activity - Insufficiently Active (05/08/2023)   Exercise Vital Sign    Days of Exercise per Week: 3 days    Minutes of Exercise per Session: 30 min  Stress: No Stress Concern Present (05/22/2023)   [employer] of Occupational Health - Occupational Stress Questionnaire    Feeling of Stress : Not at all  Recent Concern: Stress - Stress Concern  Present (05/08/2023)   [employer] of Occupational Health - Occupational Stress Questionnaire    Feeling of Stress : To some extent  Social Connections: Moderately Integrated (05/22/2023)   Social Connection and Isolation Panel [NHANES]    Frequency of Communication with Friends and Family: More than three times a week    Frequency of Social Gatherings with Friends and Family: More than three times a week    Attends Religious Services:  More than 4 times per year    Active Member of Clubs or Organizations: Yes    Attends Banker Meetings: More than 4 times per year    Marital Status: Divorced     Family History: The patient's family history includes Alcohol abuse in her father; Arthritis in her maternal grandmother and mother; Cirrhosis in her father; Dementia in her mother; Drug abuse in her sister; Murrell Arrant' disease in her son; Heart attack in her father; Hypertension in her mother; Scleroderma in her brother. There is no history of Colon cancer.  ROS:   Please see the history of present illness.     All other systems reviewed and are negative.  EKGs/Labs/Other Studies Reviewed:    The following studies were reviewed today: Ecg from 05/12/23 reviewed. NSR with nonspecific TWA. No change in multiple Ecgs dating back to 2012. I have personally reviewed and interpreted this study.       Recent Labs: 12/30/2022: ALT 11; BUN 12; Creatinine, Ser 0.78; Hemoglobin 14.5; Platelets 375; Potassium 4.2; Sodium 142; TSH 1.160  Recent Lipid Panel    Component Value Date/Time   CHOL 132 12/30/2022 1122   TRIG 112 12/30/2022 1122   HDL 45 12/30/2022 1122   CHOLHDL 2.9 12/30/2022 1122   CHOLHDL 2.9 04/01/2019 0833   VLDL 15 06/30/2016 0732   LDLCALC 67 12/30/2022 1122   LDLCALC 78 04/01/2019 0833     Risk Assessment/Calculations:                Physical Exam:    VS:  BP 126/82 (BP Location: Left Arm, Patient Position: Sitting, Cuff Size: Large)   Pulse 93   Ht 5\' 6"   (1.676 m)   Wt 253 lb 6.4 oz (114.9 kg)   SpO2 97%   BMI 40.90 kg/m     Wt Readings from Last 3 Encounters:  07/24/23 253 lb 6.4 oz (114.9 kg)  05/22/23 246 lb (111.6 kg)  05/12/23 248 lb (112.5 kg)     GEN:  Well nourished, well developed in no acute distress HEENT: Normal NECK: No JVD; No carotid bruits LYMPHATICS: No lymphadenopathy CARDIAC: RRR, no murmurs, rubs, gallops RESPIRATORY:  Clear to auscultation without rales, wheezing or rhonchi  ABDOMEN: Soft, non-tender, non-distended MUSCULOSKELETAL:  No edema; No deformity  SKIN: Warm and dry NEUROLOGIC:  Alert and oriented x 3 PSYCHIATRIC:  Normal affect   ASSESSMENT:    1. Abnormal EKG   2. Primary hypertension   3. Prediabetes    PLAN:    In order of problems listed above:  Abnormal Ecg- nonspecific T wave abnormality. No change dating back to 2012. She is completely asymptomatic from a cardiac standpoint so no further evaluation warranted at this time. HTN controlled Pre diabetes. Encourage weight loss and low carb diet.   Follow up PRN if any cardiac symptoms arise          Medication Adjustments/Labs and Tests Ordered: Current medicines are reviewed at length with the patient today.  Concerns regarding medicines are outlined above.  No orders of the defined types were placed in this encounter.  No orders of the defined types were placed in this encounter.   Patient Instructions  Medication Instructions:  Continue same medications  Lab Work: None ordered  Testing/Procedures: None ordered  Follow-Up: At [facility], you and your health needs are our priority.  As part of our continuing mission to provide you with exceptional heart care, our providers are all part of one  team.  This team includes your primary Cardiologist (physician) and Advanced Practice Providers or APPs (Physician Assistants and Nurse Practitioners) who all work together to provide you with the care you need, when you  need it.  Your next appointment:  As Needed    Provider:  Dr.Emmaus Brandi   We recommend signing up for the patient portal called "MyChart".  Sign up information is provided on this After Visit Summary.  MyChart is used to connect with patients for Virtual Visits (Telemedicine).  Patients are able to view lab/test results, encounter notes, upcoming appointments, etc.  Non-urgent messages can be sent to your provider as well.   To learn more about what you can do with MyChart, go to [email or web address].       Signed, Gergory Biello [place], MD  07/24/2023 10:08 AM    [facility]

## 2023-07-24 ENCOUNTER — Ambulatory Visit: Attending: Cardiology | Admitting: Cardiology

## 2023-07-24 ENCOUNTER — Encounter: Payer: Self-pay | Admitting: Cardiology

## 2023-07-24 VITALS — BP 126/82 | HR 93 | Ht 66.0 in | Wt 253.4 lb

## 2023-07-24 DIAGNOSIS — R9431 Abnormal electrocardiogram [ECG] [EKG]: Secondary | ICD-10-CM

## 2023-07-24 DIAGNOSIS — R7303 Prediabetes: Secondary | ICD-10-CM

## 2023-07-24 DIAGNOSIS — I10 Essential (primary) hypertension: Secondary | ICD-10-CM

## 2023-07-24 NOTE — Patient Instructions (Signed)

## 2023-07-27 ENCOUNTER — Ambulatory Visit
Admission: RE | Admit: 2023-07-27 | Discharge: 2023-07-27 | Disposition: A | Discharge status: Home / Self Care | Payer: 59 | Source: Ambulatory Visit | Attending: Family Medicine | Admitting: Family Medicine

## 2023-07-27 DIAGNOSIS — Z1231 Encounter for screening mammogram for malignant neoplasm of breast: Secondary | ICD-10-CM | POA: Diagnosis not present

## 2023-08-03 ENCOUNTER — Other Ambulatory Visit (HOSPITAL_COMMUNITY)
Admission: RE | Admit: 2023-08-03 | Discharge: 2023-08-03 | Disposition: A | Discharge status: Home / Self Care | Source: Ambulatory Visit | Attending: Family Medicine | Admitting: Family Medicine

## 2023-08-03 DIAGNOSIS — I10 Essential (primary) hypertension: Secondary | ICD-10-CM | POA: Insufficient documentation

## 2023-08-03 DIAGNOSIS — R7303 Prediabetes: Secondary | ICD-10-CM | POA: Diagnosis not present

## 2023-08-03 LAB — BASIC METABOLIC PANEL WITH GFR
Anion gap: 9 (ref 5–15)
BUN: 9 mg/dL (ref 8–23)
CO2: 27 mmol/L (ref 22–32)
Calcium: 9.2 mg/dL (ref 8.9–10.3)
Chloride: 105 mmol/L (ref 98–111)
Creatinine, Ser: 0.81 mg/dL (ref 0.44–1.00)
GFR, Estimated: >60 mL/min (ref >60)
Glucose, Bld: 113 mg/dL — ABNORMAL HIGH (ref 70–99)
Potassium: 3.7 mmol/L (ref 3.5–5.1)
Sodium: 141 mmol/L (ref 135–145)

## 2023-08-03 LAB — HEMOGLOBIN A1C
Hgb A1c MFr Bld: 5.8 % — ABNORMAL HIGH (ref 4.8–5.6)
Mean Plasma Glucose: 119.76 mg/dL

## 2023-08-15 ENCOUNTER — Encounter: Payer: Self-pay | Admitting: Family Medicine

## 2023-08-15 ENCOUNTER — Other Ambulatory Visit: Payer: Self-pay | Admitting: Family Medicine

## 2023-08-15 ENCOUNTER — Ambulatory Visit (INDEPENDENT_AMBULATORY_CARE_PROVIDER_SITE_OTHER): Admitting: Family Medicine

## 2023-08-15 VITALS — BP 116/79 | HR 91 | Resp 16 | Ht 65.0 in | Wt 242.1 lb

## 2023-08-15 DIAGNOSIS — I10 Essential (primary) hypertension: Secondary | ICD-10-CM | POA: Diagnosis not present

## 2023-08-15 DIAGNOSIS — M359 Systemic involvement of connective tissue, unspecified: Secondary | ICD-10-CM | POA: Diagnosis not present

## 2023-08-15 DIAGNOSIS — F418 Other specified anxiety disorders: Secondary | ICD-10-CM

## 2023-08-15 MED ORDER — PHENTERMINE HCL 37.5 MG PO TABS: ORAL_TABLET | ORAL | 1 refills | Status: AC

## 2023-08-15 NOTE — Patient Instructions (Signed)
 F/U in 14 weeks, call if you need me sooner  New to help with weight loss is phentermine  HALF tablet once daily  Eat regularly at least 3 meals per day, FOOD CHOICE is the key to losing weight, choose fruit and vegetables  most of the time!  Recent labs are great!  It is important that you exercise regularly at least 30 minutes 5 times a week. If you develop chest pain, have severe difficulty breathing, or feel very tired, stop exercising immediately and seek medical attention   Thanks for choosing [facility], we consider it a privelige to serve you.

## 2023-08-16 DIAGNOSIS — H2512 Age-related nuclear cataract, left eye: Secondary | ICD-10-CM | POA: Diagnosis not present

## 2023-08-18 DIAGNOSIS — H25812 Combined forms of age-related cataract, left eye: Secondary | ICD-10-CM | POA: Diagnosis not present

## 2023-08-21 ENCOUNTER — Encounter: Payer: Self-pay | Admitting: Family Medicine

## 2023-08-21 NOTE — Progress Notes (Signed)
 Jenna Woods     MRN: [id number]      DOB: 06-05-1960  Chief Complaint  Patient presents with   Hypertension    3 month follow up     HPI Jenna Woods is here for follow up and re-evaluation of chronic medical conditions, medication management and review of any available recent lab and radiology data.  Preventive health is updated, specifically  Cancer screening and Immunization.   Questions or concerns regarding consultations or procedures which the PT has had in the interim are  addressed. The PT denies any adverse reactions to current medications since the last visit.  There are no new concerns.  There are no specific complaints   ROS Denies recent fever or chills. Denies sinus pressure, nasal congestion, ear pain or sore throat. Denies chest congestion, productive cough or wheezing. Denies chest pains, palpitations and leg swelling Denies abdominal pain, nausea, vomiting,diarrhea or constipation.   Denies dysuria, frequency, hesitancy or incontinence. Chronic  joint pain, swelling and no significant  limitation in mobility. Denies headaches, seizures, numbness, or tingling. Denies depression, anxiety or insomnia. Denies skin break down or rash.   PE  BP 116/79   Pulse 91   Resp 16   Ht 5' 5 (1.651 m)   Wt 242 lb 1.9 oz (109.8 kg)   SpO2 92%   BMI 40.29 kg/m   Patient alert and oriented and in no cardiopulmonary distress.  HEENT: No facial asymmetry, EOMI,     Neck supple .  Chest: Clear to auscultation bilaterally.  CVS: S1, S2 no murmurs, no S3.Regular rate.  ABD: Soft non tender.   Ext: No edema  MS: Adequate ROM spine, shoulders, hips and knees.  Skin: Intact, no ulcerations or rash noted.  Psych: Good eye contact, normal affect. Memory intact not anxious or depressed appearing.  CNS: CN 2-12 intact, power,  normal throughout.no focal deficits noted.   Assessment & Plan  Essential hypertension Controlled, no change in medication DASH  diet and commitment to daily physical activity for a minimum of 30 minutes discussed and encouraged, as a part of hypertension management. The importance of attaining a healthy weight is also discussed.     08/15/2023   10:40 AM 07/24/2023    9:46 AM 05/22/2023    9:10 AM 05/12/2023    9:56 AM 12/30/2022   10:00 AM 09/12/2022    8:36 AM 07/08/2022    1:03 PM  BP/Weight  Systolic BP 116 126 120 120 121 119 124  Diastolic BP 79 82 80 83 83 76 85  Wt. (Lbs) 242.12 253.4 246 248 246.12 245 245.12  BMI 40.29 kg/m2 40.9 kg/m2 39.71 kg/m2 38.84 kg/m2 38.55 kg/m2 38.37 kg/m2 39.56 kg/m2       Autoimmune disease (HCC) Upcoming appt with rheumatology to reassess after over 10 year hiatus   Depression with anxiety Controlled and managed by Psych  Morbid obesity (HCC)  Patient re-educated about  the importance of commitment to a  minimum of 150 minutes of exercise per week as able.  The importance of healthy food choices with portion control discussed, as well as eating regularly and within a 12 hour window most days. The need to choose clean , green food 50 to 75% of the time is discussed, as well as to make water the primary drink and set a goal of 64 ounces water daily.       08/15/2023   10:40 AM 07/24/2023    9:46 AM 05/22/2023  9:10 AM  Weight /BMI  Weight 242 lb 1.9 oz 253 lb 6.4 oz 246 lb  Height 5' 5 (1.651 m) 5' 6 (1.676 m) 5' 6 (1.676 m)  BMI 40.29 kg/m2 40.9 kg/m2 39.71 kg/m2    Start half phentermine  daily

## 2023-08-21 NOTE — Assessment & Plan Note (Signed)
Controlled and managed by Psych 

## 2023-08-21 NOTE — Assessment & Plan Note (Signed)
 Upcoming appt with rheumatology to reassess after over 10 year hiatus

## 2023-08-21 NOTE — Assessment & Plan Note (Signed)
  Patient re-educated about  the importance of commitment to a  minimum of 150 minutes of exercise per week as able.  The importance of healthy food choices with portion control discussed, as well as eating regularly and within a 12 hour window most days. The need to choose clean , green food 50 to 75% of the time is discussed, as well as to make water the primary drink and set a goal of 64 ounces water daily.       08/15/2023   10:40 AM 07/24/2023    9:46 AM 05/22/2023    9:10 AM  Weight /BMI  Weight 242 lb 1.9 oz 253 lb 6.4 oz 246 lb  Height 5' 5 (1.651 m) 5' 6 (1.676 m) 5' 6 (1.676 m)  BMI 40.29 kg/m2 40.9 kg/m2 39.71 kg/m2    Start half phentermine  daily

## 2023-08-21 NOTE — Assessment & Plan Note (Signed)
 Controlled, no change in medication DASH diet and commitment to daily physical activity for a minimum of 30 minutes discussed and encouraged, as a part of hypertension management. The importance of attaining a healthy weight is also discussed.     08/15/2023   10:40 AM 07/24/2023    9:46 AM 05/22/2023    9:10 AM 05/12/2023    9:56 AM 12/30/2022   10:00 AM 09/12/2022    8:36 AM 07/08/2022    1:03 PM  BP/Weight  Systolic BP 116 126 120 120 121 119 124  Diastolic BP 79 82 80 83 83 76 85  Wt. (Lbs) 242.12 253.4 246 248 246.12 245 245.12  BMI 40.29 kg/m2 40.9 kg/m2 39.71 kg/m2 38.84 kg/m2 38.55 kg/m2 38.37 kg/m2 39.56 kg/m2

## 2023-08-25 ENCOUNTER — Telehealth (HOSPITAL_COMMUNITY): Admitting: Psychiatry

## 2023-08-25 ENCOUNTER — Encounter (HOSPITAL_COMMUNITY): Payer: Self-pay | Admitting: Psychiatry

## 2023-08-25 DIAGNOSIS — F411 Generalized anxiety disorder: Secondary | ICD-10-CM

## 2023-08-25 DIAGNOSIS — F331 Major depressive disorder, recurrent, moderate: Secondary | ICD-10-CM | POA: Diagnosis not present

## 2023-08-25 DIAGNOSIS — F5105 Insomnia due to other mental disorder: Secondary | ICD-10-CM

## 2023-08-25 MED ORDER — MIRTAZAPINE 30 MG PO TABS: 30.0000 mg | ORAL_TABLET | Freq: Every day | ORAL | 2 refills | Status: DC

## 2023-08-25 MED ORDER — ALPRAZOLAM 1 MG PO TABS: 1.0000 mg | ORAL_TABLET | Freq: Three times a day (TID) | ORAL | 3 refills | Status: DC

## 2023-08-25 NOTE — Progress Notes (Signed)
 Virtual Visit via Video Note  I connected with Jenna Woods on 08/25/23 at 11:00 AM EDT by a video enabled telemedicine application and verified that I am speaking with the correct person using two identifiers.  Location: Patient: home Provider: office   I discussed the limitations of evaluation and management by telemedicine and the availability of in person appointments. The patient expressed understanding and agreed to proceed.      I discussed the assessment and treatment plan with the patient. The patient was provided an opportunity to ask questions and all were answered. The patient agreed with the plan and demonstrated an understanding of the instructions.   The patient was advised to call back or seek an in-person evaluation if the symptoms worsen or if the condition fails to improve as anticipated.  I provided 20 minutes of non-face-to-face time during this encounter.   Alfredia Annas, MD  [facility] MD/PA/NP OP Progress Note  08/25/2023 11:11 AM ROBI MITTER  MRN:  [id number]  Chief Complaint:  Chief Complaint  Patient presents with   Depression   Anxiety   Follow-up   HPI: This patient is a 63 year old divorced white female who lives alone in [place]. She is on disability.   The patient returns for follow-up after 2 months regarding her depression anxiety and difficulty with sleep.  She states overall her mood has been stable.  Last time she was very upset with her daughter-in-law for not bringing her 48-year-old granddaughter to visit.  She is still not seeing the child but she is decided to love her from a distance.  She cannot force her daughter-in-law to bring the child and her son is not breathing her either.  The patient has been following a healthy lifestyle.  She is walking a lot eating fruits and vegetables and she has lost about 10 pounds.  She states however that her sleep is interrupted a lot and she wants to go back to Ambien .  I explained that this  probably is not a good idea to use at her age as it could cause cognitive problems.  I did rather increase her mirtazapine  at bedtime and she is willing to try this.  She denies being depressed or having any thoughts of self-harm.  The Xanax  continues to help her anxiety.  She does take a 2-hour nap during the day and this is probably contributing to her problems with sleep. Visit Diagnosis:    ICD-10-CM   1. Major depressive disorder, recurrent episode, moderate (HCC)  F33.1     2. Anxiety state  F41.1     3. Insomnia due to mental disorder  F51.05       Past Psychiatric History: none  Past Medical History:  Past Medical History:  Diagnosis Date   Allergic reaction    Allergy    perrenial    Anxiety    Arthritis    Depression    h/o suicidal ideation in 2011   Depression with anxiety    Diverticulosis of colon 12/19/2015   Diverticulosis of colon with hemorrhage 12/19/2015   GERD (gastroesophageal reflux disease)    Hypertension 1996   Obesity    Peripheral vascular disease (HCC)    Prediabetes 2013   Raynauds syndrome    Scleroderma (HCC) 1998   Systemic lupus erythematosus (HCC) 1998   T[facility] at [facility]   Tobacco abuse, in remission    10-pack-years discontinued in 2007   Ulcer    Urinary frequency     Past  Surgical History:  Procedure Laterality Date   ABDOMINAL HYSTERECTOMY  1990   Neoplasm   CESAREAN SECTION     x2   COLONOSCOPY  2005   COLONOSCOPY WITH PROPOFOL  N/A 08/28/2015   Dr. Homero Luster: scattered medium-mouth diverticula entire colon, external hemorrhoids   COLONOSCOPY WITH PROPOFOL  N/A 07/27/2016   Procedure: COLONOSCOPY WITH PROPOFOL ;  Surgeon: Ruby Corporal, MD;  [facility];  Service: Endoscopy;  Laterality: N/A;   FINGER AMPUTATION     Third finger, bilateral, distal   HERNIA REPAIR     INCISIONAL HERNIA REPAIR N/A 01/09/2019   Procedure: HERNIA REPAIR INCISIONAL;  Surgeon: Awilda Bogus, MD;  Location: [facility];  Service:  General;  Laterality: N/A;   INSERTION OF MESH N/A 01/09/2019   Procedure: INSERTION OF MESH;  Surgeon: Awilda Bogus, MD;  Location: [facility];  Service: General;  Laterality: N/A;   NASAL SINUS SURGERY  09/06/2011   Procedure: ENDOSCOPIC SINUS SURGERY;  Surgeon: Lawence Press, MD;  Location: [facility];  Service: ENT;  Laterality: N/A;  Nasal cerebrospinal fluid leak repair with Left temporalis fascia graft and Left Ear cartilage graft   TUBAL LIGATION     VASCULAR SURGERY     Hands, bilaterally, [facility]; right hand partial amputation of middle finger.    Family Psychiatric History: See below  Family History:  Family History  Problem Relation Age of Onset   Arthritis Mother        Rheumatoid   Dementia Mother    Hypertension Mother    Heart attack Father        Had heart attack in 11.   Cirrhosis Father    Alcohol abuse Father    Drug abuse Sister    Scleroderma Brother    Arthritis Maternal Grandmother    Graves' disease Son    Colon cancer Neg Hx     Social History:  Social History   Socioeconomic History   Marital status: Single    Spouse name: Not on file   Number of children: 2   Years of education: 12   Highest education level: Some college, no degree  Occupational History   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  Vaping Use   Vaping status: Never Used  Substance and Sexual Activity   Alcohol use: No    Comment: quit in 2004   Drug use: No    Comment: use to use pot    Sexual activity: Not Currently    Birth control/protection: Surgical    Comment: hyst  Other  Topics Concern   Not on file  Social History Narrative   4 grandchildren   Lives alone   Social Drivers of Health   Financial Resource Strain: Low Risk  (05/22/2023)   Overall Financial Resource Strain (CARDIA)    Difficulty of Paying Living Expenses: Not hard at all  Food Insecurity: No Food Insecurity (05/22/2023)   Hunger Vital Sign    Worried About Running Out of Food in the Last Year:  Never true    Ran Out of Food in the Last Year: Never true  Transportation Needs: No Transportation Needs (05/22/2023)   PRAPARE - Administrator, Civil Service (Medical): No    Lack of Transportation (Non-Medical): No  Physical Activity: Sufficiently Active (05/22/2023)   Exercise Vital Sign    Days of Exercise per Week: 7 days    Minutes of Exercise per Session: 30 min  Recent Concern: Physical Activity - Insufficiently Active (05/08/2023)   Exercise Vital Sign    Days of Exercise per Week: 3 days    Minutes of Exercise per Session: 30 min  Stress: No Stress Concern Present (05/22/2023)   [employer] of Occupational Health - Occupational Stress Questionnaire    Feeling of Stress : Not at all  Recent Concern: Stress - Stress Concern Present (05/08/2023)   [employer] of Occupational Health - Occupational Stress Questionnaire    Feeling of Stress : To some extent  Social Connections: Moderately Integrated (05/22/2023)   Social Connection and Isolation Panel    Frequency of Communication with Friends and Family: More than three times a week    Frequency of Social Gatherings with Friends and Family: More than three times a week    Attends Religious Services: More than 4 times per year    Active Member of [employer] or Organizations: Yes    Attends Engineer, structural: More than 4 times per year    Marital Status: Divorced    Allergies:  Allergies  Allergen Reactions   Bee Venom Anaphylaxis   Sesame Oil Anaphylaxis and Swelling    (Sesame seed)   Aspirin       Diverticulosis with bleed   Molds & Smuts    Zofran  [Ondansetron ] Itching    Generalized itching and had a rash on her thighs   Codeine Itching and Rash    Metabolic Disorder Labs: Lab Results  Component Value Date   HGBA1C 5.8 (H) 08/03/2023   MPG 119.76 08/03/2023   MPG 123 04/01/2019   No results found for: PROLACTIN Lab Results  Component Value Date   CHOL 132 12/30/2022   TRIG 112 12/30/2022   HDL 45 12/30/2022   CHOLHDL 2.9 12/30/2022   VLDL 15 06/30/2016   LDLCALC 67 12/30/2022   LDLCALC 79 12/28/2021   Lab Results  Component Value Date   TSH 1.160 12/30/2022   TSH 1.460 12/28/2021    Therapeutic Level Labs: No results found for: LITHIUM No results found for: VALPROATE No results found for: CBMZ  Current Medications: Current Outpatient Medications  Medication Sig Dispense Refill   mirtazapine  (REMERON ) 30 MG tablet Take 1 tablet (30 mg total) by mouth at bedtime. 30 tablet 2   ALPRAZolam  (XANAX ) 1 MG tablet Take 1 tablet (1 mg total) by mouth 3 (three) times daily. 90 tablet 3   amLODipine  (NORVASC ) 10 MG tablet TAKE 1 Tablet BY MOUTH ONCE DAILY 90 tablet 3   Ascorbic Acid (VITAMIN C) 500 MG CAPS Take 500 mg by mouth daily.     cetirizine  (ZYRTEC ) 10 MG tablet TAKE 1 Tablet BY MOUTH ONCE DAILY 30 tablet 11   Cholecalciferol (VITAMIN D3 PO) Take by mouth.     EPINEPHRINE , ANAPHYLAXIS, IJ Inject as directed.     ferrous sulfate  325 (65 FE) MG tablet Take 325 mg by mouth 1 day or 1 dose. 60 tablet 3   ketorolac  (ACULAR ) 0.5 % ophthalmic solution Place 1 drop into the right eye 4 (four) times  daily.     mirtazapine  (REMERON ) 15 MG tablet Take 1 tablet (15 mg total) by mouth at bedtime. 30 tablet 3   montelukast  (SINGULAIR ) 10 MG tablet TAKE ONE TABLET BY MOUTH (10MG ) AT BEDTIME 30 tablet 6   MYRBETRIQ  50 MG TB24 tablet TAKE 1 Tablet BY MOUTH ONCE DAILY 30 tablet 11   ofloxacin (OCUFLOX) 0.3 % ophthalmic solution Place 1 drop into the right eye 4 (four)  times daily.     pantoprazole  (PROTONIX ) 40 MG tablet TAKE 1 Tablet BY MOUTH ONCE DAILY 90 tablet 1   phentermine  (ADIPEX-P ) 37.5 MG tablet Take half tablet by mouth every morning with breakfast 30 tablet 1   prednisoLONE acetate (PRED FORTE) 1 % ophthalmic suspension Place 1 drop into the right eye 4 (four) times daily.     spironolactone  (ALDACTONE ) 25 MG tablet Take 1 Tablet by mouth once daily 30 tablet PRN   tiZANidine  (ZANAFLEX ) 2 MG tablet Take two tablets by mouth once daily, as needed for muscle spasm 60 tablet 2   No current facility-administered medications for this visit.     Musculoskeletal: Strength & Muscle Tone: within normal limits Gait & Station: normal Patient leans: N/A  Psychiatric Specialty Exam: Review of Systems  Psychiatric/Behavioral:  Positive for sleep disturbance.   All other systems reviewed and are negative.   There were no vitals taken for this visit.There is no height or weight on file to calculate BMI.  General Appearance: Casual and Fairly Groomed  Eye Contact:  Good  Speech:  Clear and Coherent  Volume:  Normal  Mood:  Euthymic  Affect:  Congruent  Thought Process:  Goal Directed  Orientation:  Full (Time, Place, and Person)  Thought Content: WDL   Suicidal Thoughts:  No  Homicidal Thoughts:  No  Memory:  Immediate;   Good Recent;   Good Remote;   Good  Judgement:  Good  Insight:  Fair  Psychomotor Activity:  Normal  Concentration:  Concentration: Good and Attention Span: Good  Recall:  Good  Fund of Knowledge: Good  Language: Good  Akathisia:  No  Handed:  Right  AIMS (if indicated): not done  Assets:  Communication Skills Desire for Improvement Resilience Social Support  ADL's:  Intact  Cognition: WNL  Sleep:  Fair   Screenings: GAD-7    Flowsheet Row Office Visit from 08/15/2023 in [facility] Office Visit from 12/30/2022 in [facility] Office Visit from 09/12/2022 in [facility] Office Visit from 07/08/2022 in [facility] Office Visit from 12/19/2019 in [facility]  Total GAD-7 Score 15 1 14 5  0   PHQ2-9    Flowsheet Row Office Visit from 08/15/2023 in [facility] Clinical Support from 05/22/2023 in [facility] Office Visit from 05/12/2023 in [facility] Office Visit from 12/30/2022 in [facility] Office Visit from 09/12/2022 in [facility]  PHQ-2 Total Score 2 0 0 0 4  PHQ-9 Total Score 6 0 -- -- 10   Flowsheet Row ED from 04/01/2022 in [facility] at [facility] Video Visit from 11/01/2021 in [facility] [place] Video Visit from 08/06/2021 in [facility] at [place]  C-SSRS RISK CATEGORY No Risk No Risk No Risk     Assessment and Plan: This patient is a 63 year old  female with a history of depression anxiety insomnia.  Her mood has been stable but she is not sleeping as well as she would like.  Will increase mirtazapine  to 30 mg at bedtime.  She will continue Xanax  1 mg 3 times daily for anxiety.  She will return to see me in 3 months  Collaboration of Care: Collaboration of Care: Primary Care Provider AEB notes are shared with PCP on the epic system  Patient/Guardian was advised Release of Information must be obtained prior to any record release in order to collaborate their care with an outside provider. Patient/Guardian was advised if they have not already done so to contact the registration department to sign all necessary forms in order for us  to release information regarding their care.   Consent: Patient/Guardian gives verbal consent for treatment and assignment of benefits for services provided during this visit. Patient/Guardian expressed understanding and agreed to proceed.     Alfredia Annas, MD 08/25/2023, 11:11 AM

## 2023-09-01 ENCOUNTER — Telehealth (HOSPITAL_COMMUNITY): Admitting: Psychiatry

## 2023-09-27 ENCOUNTER — Ambulatory Visit (HOSPITAL_COMMUNITY): Admitting: Psychiatry

## 2023-10-12 ENCOUNTER — Other Ambulatory Visit: Payer: Self-pay | Admitting: Family Medicine

## 2023-10-12 DIAGNOSIS — K219 Gastro-esophageal reflux disease without esophagitis: Secondary | ICD-10-CM

## 2023-11-10 ENCOUNTER — Other Ambulatory Visit: Payer: Self-pay | Admitting: Family Medicine

## 2023-11-10 ENCOUNTER — Other Ambulatory Visit (HOSPITAL_COMMUNITY): Payer: Self-pay | Admitting: Psychiatry

## 2023-11-10 NOTE — Telephone Encounter (Unsigned)
 Copied from CRM [id number]. Topic: Clinical - Medication Refill >> Nov 10, 2023  4:05 PM Fonda T wrote: Medication: tiZANidine  (ZANAFLEX ) 4 MG tablet   Has the patient contacted their pharmacy? Yes, advise to contact office   This is the patient's preferred pharmacy:  [facility] - [place], [place] - [address] [place] [address] Phone: [phone] Fax: [phone]  Is this the correct pharmacy for this prescription? Yes If no, delete pharmacy and type the correct one.   Has the prescription been filled recently? Yes  Is the patient out of the medication? Yes  Has the patient been seen for an appointment in the last year OR does the patient have an upcoming appointment? Yes  Can we respond through MyChart? No, patient prefers phone if need to follow up at [phone]  Agent: Please be advised that Rx refills may take up to 3 business days. We ask that you follow-up with your pharmacy.

## 2023-11-13 ENCOUNTER — Other Ambulatory Visit: Payer: Self-pay | Admitting: Obstetrics & Gynecology

## 2023-11-13 DIAGNOSIS — N3941 Urge incontinence: Secondary | ICD-10-CM

## 2023-11-14 ENCOUNTER — Telehealth: Payer: Self-pay

## 2023-11-14 NOTE — Telephone Encounter (Signed)
 Copied from CRM [id number]. Topic: Clinical - Medication Question >> Nov 14, 2023  1:02 PM Carlatta H wrote: Reason for CRM: Patient was taking MYRBETRIQ  50 MG TB24 tablet [[id number]] prescribed by a different doctor//She is not longer seeing that doctor and would like it to be prescribed by Dr Simpson//Please call to advised

## 2023-11-15 NOTE — Telephone Encounter (Signed)
 scheduled

## 2023-11-16 ENCOUNTER — Other Ambulatory Visit: Payer: Self-pay

## 2023-11-16 DIAGNOSIS — N3941 Urge incontinence: Secondary | ICD-10-CM

## 2023-11-16 MED ORDER — MIRABEGRON ER 50 MG PO TB24: 50.0000 mg | ORAL_TABLET | Freq: Every day | ORAL | 1 refills | Status: DC

## 2023-11-23 ENCOUNTER — Ambulatory Visit: Admitting: Family Medicine

## 2023-11-24 ENCOUNTER — Telehealth (INDEPENDENT_AMBULATORY_CARE_PROVIDER_SITE_OTHER): Admitting: Psychiatry

## 2023-11-24 ENCOUNTER — Encounter (HOSPITAL_COMMUNITY): Payer: Self-pay | Admitting: Psychiatry

## 2023-11-24 DIAGNOSIS — F331 Major depressive disorder, recurrent, moderate: Secondary | ICD-10-CM

## 2023-11-24 DIAGNOSIS — F5105 Insomnia due to other mental disorder: Secondary | ICD-10-CM | POA: Diagnosis not present

## 2023-11-24 DIAGNOSIS — F411 Generalized anxiety disorder: Secondary | ICD-10-CM | POA: Diagnosis not present

## 2023-11-24 MED ORDER — ALPRAZOLAM 1 MG PO TABS: 1.0000 mg | ORAL_TABLET | Freq: Three times a day (TID) | ORAL | 3 refills | Status: DC

## 2023-11-24 MED ORDER — MIRTAZAPINE 30 MG PO TABS: 30.0000 mg | ORAL_TABLET | Freq: Every day | ORAL | 3 refills | Status: DC

## 2023-11-24 NOTE — Progress Notes (Signed)
 Virtual Visit via Video Note  I connected with Jenna Woods on 11/24/23 at 10:00 AM EDT by a video enabled telemedicine application and verified that I am speaking with the correct person using two identifiers.  Location: Patient: home Provider: office   I discussed the limitations of evaluation and management by telemedicine and the availability of in person appointments. The patient expressed understanding and agreed to proceed.     I discussed the assessment and treatment plan with the patient. The patient was provided an opportunity to ask questions and all were answered. The patient agreed with the plan and demonstrated an understanding of the instructions.   The patient was advised to call back or seek an in-person evaluation if the symptoms worsen or if the condition fails to improve as anticipated.  I provided 20 minutes of non-face-to-face time during this encounter.   Barnie Gull, MD  [facility] MD/PA/NP OP Progress Note  11/24/2023 10:17 AM Jenna Woods  MRN:  [id number]  Chief Complaint:  Chief Complaint  Patient presents with   Anxiety   Follow-up   HPI: This patient is a 63 year old divorced white female who lives alone in [place]. She is on disability.   The patient returns for follow-up after 3 months regarding depression anxiety and difficulty with sleep.  She states that she is doing very well.  Last time I increased her mirtazapine  30 mg at bedtime and she is sleeping better.  She is not worrying about her grandchildren and children at much and is just decided to let things go.  She is decided to find more things for herself to do.  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  Her mood is good and she denies significant depression anxiety thoughts of self-harm.  The Xanax  continues to help with her anxiety Visit Diagnosis:    ICD-10-CM   1. Major depressive disorder, recurrent episode, moderate (HCC)  F33.1     2. Anxiety state   F41.1     3. Insomnia due to mental disorder  F51.05       Past Psychiatric History: none  Past Medical History:  Past Medical History:  Diagnosis Date   Allergic reaction    Allergy    perrenial    Anxiety    Arthritis    Depression    h/o suicidal ideation in 2011   Depression with anxiety    Diverticulosis of colon 12/19/2015   Diverticulosis of colon with hemorrhage 12/19/2015   GERD (gastroesophageal reflux disease)    Hypertension 1996   Obesity    Peripheral vascular disease (HCC)    Prediabetes 2013   Raynauds syndrome    Scleroderma (HCC) 1998   Systemic lupus erythematosus (HCC) 1998   Treated at [facility]   Tobacco abuse, in remission    10-pack-years discontinued in 2007   Ulcer    Urinary frequency     Past Surgical History:  Procedure Laterality Date   ABDOMINAL HYSTERECTOMY  1990   Neoplasm   CESAREAN SECTION     x2   COLONOSCOPY  2005   COLONOSCOPY WITH PROPOFOL  N/A 08/28/2015   Dr. Golda: scattered medium-mouth diverticula entire colon, external hemorrhoids   COLONOSCOPY WITH PROPOFOL  N/A 07/27/2016   Procedure: COLONOSCOPY WITH PROPOFOL ;  Surgeon: Golda Claudis PENNER, MD;  Location: [facility];  Service: Endoscopy;  Laterality: N/A;   FINGER AMPUTATION     Third finger, bilateral, distal   HERNIA REPAIR  INCISIONAL HERNIA REPAIR N/A 01/09/2019   Procedure: HERNIA REPAIR INCISIONAL;  Surgeon: Kallie Manuelita BROCKS, MD;  Location: [facility];  Service: General;  Laterality: N/A;   INSERTION OF MESH N/A 01/09/2019   Procedure: INSERTION OF MESH;  Surgeon: Kallie Manuelita BROCKS, MD;  Location: [facility];  Service: General;  Laterality: N/A;   NASAL SINUS SURGERY  09/06/2011   Procedure: ENDOSCOPIC SINUS SURGERY;  Surgeon: Ana LELON Moccasin, MD;  Location: [facility];  Service: ENT;  Laterality: N/A;  Nasal cerebrospinal fluid leak repair with Left temporalis fascia graft and Left Ear cartilage graft   TUBAL LIGATION     VASCULAR SURGERY     Hands,  bilaterally, [facility]; right hand partial amputation of middle finger.    Family Psychiatric History: See below  Family History:  Family History  Problem Relation Age of Onset   Arthritis Mother        Rheumatoid   Dementia Mother    Hypertension Mother    Heart attack Father        Had heart attack in 100.   Cirrhosis Father    Alcohol abuse Father    Drug abuse Sister    Scleroderma Brother    Arthritis Maternal Grandmother    Graves' disease Son    Colon cancer Neg Hx     Social History:  Social History   Socioeconomic History   Marital status: Single    Spouse name: Not on file   Number of children: 2   Years of education: 12   Highest education level: Some college, no degree  Occupational History   Occupation: Youth worker: [employer]    Comment: Disability awarded in 1998  Tobacco Use   Smoking status: Former    Current packs/day: 0.00    Average packs/day: 0.3 packs/day for 25.0 years (6.3 ttl pk-yrs)    Types: Cigarettes    Start date: 04/07/1976    Quit date: 04/07/2001    Years since quitting: 22.6   Smokeless tobacco: Never   Tobacco comments:    quit 88 days ago!!  Vaping Use   Vaping status: Never Used  Substance and Sexual Activity   Alcohol use: No    Comment: quit in 2004   Drug use: No    Comment: use to use pot    Sexual activity: Not Currently    Birth control/protection: Surgical    Comment: hyst  Other Topics Concern   Not on file  Social History Narrative   4 grandchildren   Lives alone   Social Drivers of Health   Financial Resource Strain: Low Risk  (05/22/2023)   Overall Financial Resource Strain (CARDIA)    Difficulty of Paying Living Expenses: Not hard at all  Food Insecurity: No Food Insecurity (05/22/2023)   Hunger Vital Sign    Worried About Running Out of Food in the Last Year: Never true    Ran Out of Food in the Last Year: Never true  Transportation Needs: No Transportation Needs  (05/22/2023)   PRAPARE - Administrator, Civil Service (Medical): No    Lack of Transportation (Non-Medical): No  Physical Activity: Sufficiently Active (05/22/2023)   Exercise Vital Sign    Days of Exercise per Week: 7 days    Minutes of Exercise per Session: 30 min  Recent Concern: Physical Activity - Insufficiently Active (05/08/2023)   Exercise Vital Sign    Days of Exercise per Week: 3  days    Minutes of Exercise per Session: 30 min  Stress: No Stress Concern Present (05/22/2023)   [employer] of Occupational Health - Occupational Stress Questionnaire    Feeling of Stress : Not at all  Recent Concern: Stress - Stress Concern Present (05/08/2023)   [employer] of Occupational Health - Occupational Stress Questionnaire    Feeling of Stress : To some extent  Social Connections: Moderately Integrated (05/22/2023)   Social Connection and Isolation Panel    Frequency of Communication with Friends and Family: More than three times a week    Frequency of Social Gatherings with Friends and Family: More than three times a week    Attends Religious Services: More than 4 times per year    Active Member of [employer] or Organizations: Yes    Attends Engineer, structural: More than 4 times per year    Marital Status: Divorced    Allergies:  Allergies  Allergen Reactions   Bee Venom Anaphylaxis   Sesame Oil Anaphylaxis and Swelling    (Sesame seed)   Aspirin      Diverticulosis with bleed   Molds & Smuts    Zofran  [Ondansetron ] Itching    Generalized itching and had a rash on her thighs   Codeine Itching and Rash    Metabolic Disorder Labs: Lab Results  Component Value Date   HGBA1C 5.8 (H) 08/03/2023   MPG 119.76 08/03/2023   MPG 123 04/01/2019   No results found for: PROLACTIN Lab Results  Component Value Date   CHOL 132 12/30/2022   TRIG 112 12/30/2022   HDL 45 12/30/2022   CHOLHDL 2.9 12/30/2022   VLDL 15 06/30/2016   LDLCALC 67 12/30/2022    LDLCALC 79 12/28/2021   Lab Results  Component Value Date   TSH 1.160 12/30/2022   TSH 1.460 12/28/2021    Therapeutic Level Labs: No results found for: LITHIUM No results found for: VALPROATE No results found for: CBMZ  Current Medications: Current Outpatient Medications  Medication Sig Dispense Refill   ALPRAZolam  (XANAX ) 1 MG tablet Take 1 tablet (1 mg total) by mouth 3 (three) times daily. 90 tablet 3   amLODipine  (NORVASC ) 10 MG tablet TAKE 1 Tablet BY MOUTH ONCE DAILY 90 tablet 3   Ascorbic Acid (VITAMIN C) 500 MG CAPS Take 500 mg by mouth daily.     cetirizine  (ZYRTEC ) 10 MG tablet TAKE 1 Tablet BY MOUTH ONCE DAILY 30 tablet 11   Cholecalciferol (VITAMIN D3 PO) Take by mouth.     EPINEPHRINE , ANAPHYLAXIS, IJ Inject as directed.     ferrous sulfate  325 (65 FE) MG tablet Take 325 mg by mouth 1 day or 1 dose. 60 tablet 3   mirabegron  ER (MYRBETRIQ ) 50 MG TB24 tablet Take 1 tablet (50 mg total) by mouth daily. 30 tablet 1   mirtazapine  (REMERON ) 30 MG tablet Take 1 tablet (30 mg total) by mouth at bedtime. 30 tablet 3   montelukast  (SINGULAIR ) 10 MG tablet TAKE ONE TABLET BY MOUTH (10MG ) AT BEDTIME 30 tablet 6   pantoprazole  (PROTONIX ) 40 MG tablet TAKE 1 Tablet BY MOUTH ONCE DAILY 90 tablet 1   phentermine  (ADIPEX-P ) 37.5 MG tablet Take half tablet by mouth every morning with breakfast 30 tablet 1   spironolactone  (ALDACTONE ) 25 MG tablet Take 1 Tablet by mouth once daily 30 tablet PRN   tiZANidine  (ZANAFLEX ) 2 MG tablet Take two tablets by mouth once daily, as needed for muscle spasm 60 tablet 2  No current facility-administered medications for this visit.     Musculoskeletal: Strength & Muscle Tone: within normal limits Gait & Station: normal Patient leans: N/A  Psychiatric Specialty Exam: Review of Systems  Musculoskeletal:  Positive for arthralgias.  All other systems reviewed and are negative.   There were no vitals taken for this visit.There is no height  or weight on file to calculate BMI.  General Appearance: Casual and Fairly Groomed  Eye Contact:  Good  Speech:  Clear and Coherent  Volume:  Normal  Mood:  Euthymic  Affect:  Congruent  Thought Process:  Goal Directed  Orientation:  Full (Time, Place, and Person)  Thought Content: WDL   Suicidal Thoughts:  No  Homicidal Thoughts:  No  Memory:  Immediate;   Good Recent;   Good Remote;   Good  Judgement:  Good  Insight:  Good  Psychomotor Activity:  Normal  Concentration:  Concentration: Good and Attention Span: Good  Recall:  Good  Fund of Knowledge: Good  Language: Good  Akathisia:  No  Handed:  Right  AIMS (if indicated): not done  Assets:  Communication Skills Desire for Improvement Resilience Social Support  ADL's:  Intact  Cognition: WNL  Sleep:  Good   Screenings: GAD-7    Flowsheet Row Office Visit from 08/15/2023 in [facility] Office Visit from 12/30/2022 in [facility] Office Visit from 09/12/2022 in [facility] Office Visit from 07/08/2022 in [facility] Office Visit from 12/19/2019 in [facility]  Total GAD-7 Score 15 1 14 5  0   PHQ2-9    Flowsheet Row Office Visit from 08/15/2023 in [facility] Clinical Support from 05/22/2023 in [facility] Office Visit from 05/12/2023 in [facility] Office Visit from 12/30/2022 in [facility] Office Visit from 09/12/2022 in [facility]  PHQ-2 Total Score 2 0 0 0 4  PHQ-9 Total Score 6 0 -- -- 10   Flowsheet Row ED from 04/01/2022 in [facility] Video Visit from 11/01/2021 in [facility] [place] Video Visit from 08/06/2021 in [facility] at [place]  C-SSRS RISK CATEGORY No Risk No Risk No Risk      Assessment and Plan: This patient is a 63 year old female with a history of depression anxiety and insomnia.  She is doing well on her current regimen.  She will continue mirtazapine  30 mg at bedtime for anxiety and sleep as well as Xanax  1 mg 3 times daily for anxiety.  She will return to see me in 4 months  Collaboration of Care: Collaboration of Care: Primary Care Provider AEB notes are shared with PCP on the epic system  Patient/Guardian was advised Release of Information must be obtained prior to any record release in order to collaborate their care with an outside provider. Patient/Guardian was advised if they have not already done so to contact the registration department to sign all necessary forms in order for us  to release information regarding their care.   Consent: Patient/Guardian gives verbal consent for treatment and assignment of benefits for services provided during this visit. Patient/Guardian expressed understanding and agreed to proceed.    Barnie Gull, MD 11/24/2023, 10:17 AM

## 2023-11-28 ENCOUNTER — Ambulatory Visit: Admitting: Nurse Practitioner

## 2023-12-20 ENCOUNTER — Encounter (INDEPENDENT_AMBULATORY_CARE_PROVIDER_SITE_OTHER): Payer: Self-pay | Admitting: Gastroenterology

## 2024-01-08 ENCOUNTER — Other Ambulatory Visit: Payer: Self-pay | Admitting: Obstetrics & Gynecology

## 2024-01-08 DIAGNOSIS — N3941 Urge incontinence: Secondary | ICD-10-CM

## 2024-01-11 ENCOUNTER — Other Ambulatory Visit

## 2024-01-12 ENCOUNTER — Ambulatory Visit: Attending: Internal Medicine | Admitting: Internal Medicine

## 2024-01-12 ENCOUNTER — Encounter: Payer: Self-pay | Admitting: Internal Medicine

## 2024-01-12 VITALS — BP 102/73 | HR 90 | Temp 98.1°F | Resp 16 | Ht 64.5 in | Wt 241.2 lb

## 2024-01-12 DIAGNOSIS — M329 Systemic lupus erythematosus, unspecified: Secondary | ICD-10-CM

## 2024-01-12 DIAGNOSIS — M349 Systemic sclerosis, unspecified: Secondary | ICD-10-CM

## 2024-01-12 DIAGNOSIS — I7301 Raynaud's syndrome with gangrene: Secondary | ICD-10-CM | POA: Diagnosis not present

## 2024-01-12 DIAGNOSIS — M359 Systemic involvement of connective tissue, unspecified: Secondary | ICD-10-CM | POA: Diagnosis not present
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
## 2024-01-15 LAB — ANA,IFA RA DIAG PNL W/RFLX TIT/PATN
Anti Nuclear Antibody (ANA): POSITIVE — AB
Cyclic Citrullin Peptide Ab: <16 U
Rheumatoid fact SerPl-aCnc: <10 [IU]/mL (ref <14)

## 2024-01-15 LAB — C-REACTIVE PROTEIN: CRP: <3 mg/L (ref <8.0)

## 2024-01-15 LAB — PROTEIN / CREATININE RATIO, URINE
Creatinine, Urine: 132 mg/dL (ref 20–275)
Protein/Creat Ratio: 91 mg/g{creat} (ref 24–184)
Protein/Creatinine Ratio: 0.091 mg/mg{creat} (ref 0.024–0.184)
Total Protein, Urine: 12 mg/dL (ref 5–24)

## 2024-01-15 LAB — ANTI-NUCLEAR AB-TITER (ANA TITER)
ANA TITER: 1:1280 {titer} — ABNORMAL HIGH
ANA Titer 1: 1:40 {titer} — ABNORMAL HIGH

## 2024-01-15 LAB — SEDIMENTATION RATE: Sed Rate: 22 mm/h (ref 0–30)

## 2024-01-15 LAB — C3 AND C4
C3 Complement: 167 mg/dL (ref 83–193)
C4 Complement: 29 mg/dL (ref 15–57)

## 2024-02-05 ENCOUNTER — Other Ambulatory Visit (HOSPITAL_COMMUNITY): Payer: Self-pay | Admitting: Psychiatry

## 2024-02-06 ENCOUNTER — Other Ambulatory Visit (HOSPITAL_BASED_OUTPATIENT_CLINIC_OR_DEPARTMENT_OTHER)

## 2024-02-09 ENCOUNTER — Encounter (HOSPITAL_COMMUNITY): Payer: Self-pay | Admitting: General Surgery

## 2024-02-21 ENCOUNTER — Telehealth: Payer: Self-pay | Admitting: *Deleted

## 2024-02-21 NOTE — Telephone Encounter (Signed)
 Patient contacted the office stating her bones are aching. Patient states she was seen in our office last month. Patient would like to have a prescription sent to the pharmacy to help with her pain. Patient's next follow up scheduled for 04/16/2024.  Please advise.

## 2024-03-06 ENCOUNTER — Other Ambulatory Visit: Payer: Self-pay | Admitting: Obstetrics & Gynecology

## 2024-03-06 DIAGNOSIS — N3941 Urge incontinence: Secondary | ICD-10-CM

## 2024-03-08 ENCOUNTER — Encounter: Payer: Self-pay | Admitting: Family Medicine

## 2024-03-08 ENCOUNTER — Ambulatory Visit: Admitting: Family Medicine

## 2024-03-08 VITALS — BP 128/82 | HR 94 | Resp 14 | Ht 65.0 in | Wt 241.0 lb

## 2024-03-08 DIAGNOSIS — F329 Major depressive disorder, single episode, unspecified: Secondary | ICD-10-CM

## 2024-03-08 DIAGNOSIS — Z6841 Body Mass Index (BMI) 40.0 and over, adult: Secondary | ICD-10-CM | POA: Diagnosis not present

## 2024-03-08 DIAGNOSIS — R32 Unspecified urinary incontinence: Secondary | ICD-10-CM

## 2024-03-08 DIAGNOSIS — M5416 Radiculopathy, lumbar region: Secondary | ICD-10-CM

## 2024-03-08 DIAGNOSIS — R7302 Impaired glucose tolerance (oral): Secondary | ICD-10-CM

## 2024-03-08 DIAGNOSIS — Z780 Asymptomatic menopausal state: Secondary | ICD-10-CM

## 2024-03-08 DIAGNOSIS — I10 Essential (primary) hypertension: Secondary | ICD-10-CM

## 2024-03-08 DIAGNOSIS — E559 Vitamin D deficiency, unspecified: Secondary | ICD-10-CM | POA: Diagnosis not present

## 2024-03-08 MED ORDER — METHOCARBAMOL 500 MG PO TABS: ORAL_TABLET | ORAL | 0 refills | Status: DC

## 2024-03-08 NOTE — Patient Instructions (Addendum)
 Annual exam in 3 to 4 months  I have messaged Dr Okey regarding your mental health, pls accept my condolence on recent loss of your sister  PLEASE schedule bone density  at breast Center in [place] at time of checkout  Please schedule mammogram due in May 23 or after at breast center give appt info at time of checkout please  You are prescribed robaxin  for acute low back pain and spasm  CBC, cmp and eGFR, hBA1C, tSH and vit D today  It is important that you exercise regularly at least 30 minutes 5 times a week. If you develop chest pain, have severe difficulty breathing, or feel very tired, stop exercising immediately and seek medical attention   Think about what you will eat, plan ahead. Choose  clean, green, fresh or frozen over canned, processed or packaged foods which are more sugary, salty and fatty. 70 to 75% of food eaten should be vegetables and fruit. Three meals at set times with snacks allowed between meals, but they must be fruit or vegetables. Aim to eat over a 12 hour period , example 7 am to 7 pm, and STOP after  your last meal of the day. Drink water,generally about 64 ounces per day, no other drink is as healthy. Fruit juice is best enjoyed in a healthy way, by EATING the fruit.

## 2024-03-08 NOTE — Progress Notes (Signed)
 "  Jenna Woods     MRN: [id number]      DOB: 01-12-1961  Chief Complaint  Patient presents with   Hypertension    14 week follow up    Spasms    Pt complains of muscle spasm of lower back since Wednesday. Pt states it is getting unbearable to walk. No known injury     HPI Ms. Reller is here for follow up and re-evaluation of chronic medical conditions, medication management and review of any available recent lab and radiology data.  Preventive health is updated, specifically  Cancer screening and Immunization.   Oldest sister died at age 44 on 01/13/2025 at [facility] after being on life support , pt did not know of her sister's illness  New back pain x 3 days started when she twisted putting on a coat, turning aggravates pain both sides Uncontrolled depression aggravated by grief at unexpected passing of her older sister approximately 2 months ago, not actively suicidal or homicidal but sometimes states she wishes that she had passed and not her sister, has upcoming appt with Psych who is unaware of the situation will message for sooner appt if able ROS Denies recent fever or chills. Denies sinus pressure, nasal congestion, ear pain or sore throat. Denies chest congestion, productive cough or wheezing. Denies chest pains, palpitations and leg swelling Denies abdominal pain, nausea, vomiting,diarrhea or constipation.   Denies dysuria, frequency, hesitancy or incontinence. Denies skin break down or rash.   PE  BP 128/82   Pulse 94   Resp 14   Ht 5' 5 (1.651 m)   Wt 241 lb (109.3 kg)   SpO2 96%   BMI 40.10 kg/m    Patient alert and oriented and in no cardiopulmonary distress.Crying , tearful  HEENT: No facial asymmetry, EOMI,     Neck supple .  Chest: Clear to auscultation bilaterally.  CVS: S1, S2 no murmurs, no S3.Regular rate.  ABD: Soft non tender.   Ext: No edema  MS: Tender to palpation over LS spine, decreased ROM l/S spine with paraspinal spasm Normal  ROM   shoulders, decreased in hips and knees.  Skin: Intact, no ulcerations or rash noted.  Psych: Good eye contact, Tearful,  anxious and  depressed appearing.  CNS: CN 2-12 intact, power,  normal throughout.no focal deficits noted.   Assessment & Plan  Essential hypertension Controlled, no change in medication DASH diet and commitment to daily physical activity for a minimum of 30 minutes discussed and encouraged, as a part of hypertension management. The importance of attaining a healthy weight is also discussed.     03/08/2024    2:56 PM 03/08/2024    2:01 PM 01/12/2024    9:45 AM 01/12/2024    9:26 AM 08/15/2023   10:40 AM 07/24/2023    9:46 AM 05/22/2023    9:10 AM  BP/Weight  Systolic BP 128 139 102 112 116 126 120  Diastolic BP 82 86 73 77 79 82 80  Wt. (Lbs) 241   241.2 242.12 253.4 246  BMI 40.1 kg/m2   40.76 kg/m2 40.29 kg/m2 40.9 kg/m2 39.71 kg/m2       Morbid obesity (HCC)  Patient re-educated about  the importance of commitment to a  minimum of 150 minutes of exercise per week as able.  The importance of healthy food choices with portion control discussed, as well as eating regularly and within a 12 hour window most days. The need to choose clean , green food  50 to 75% of the time is discussed, as well as to make water the primary drink and set a goal of 64 ounces water daily.       03/08/2024    2:56 PM 01/12/2024    9:26 AM 08/15/2023   10:40 AM  Weight /BMI  Weight 241 lb 241 lb 3.2 oz 242 lb 1.9 oz  Height 5' 5 (1.651 m) 5' 4.5 (1.638 m) 5' 5 (1.651 m)  BMI 40.1 kg/m2 40.76 kg/m2 40.29 kg/m2    Unchanged  Major depression Requesting and needing sooner appointment with Psych, message sent  Vitamin D  deficiency Updated lab needed at/ before next visit.   Post-menopause Dexa past due appt to be scheduled  Lumbar radicular pain Acute LBP to butocks aggarvated by movement, robaxin  is prescribed  IGT (impaired glucose tolerance) Patient educated  about the importance of limiting  Carbohydrate intake , the need to commit to daily physical activity for a minimum of 30 minutes , and to commit weight loss. The fact that changes in all these areas will reduce or eliminate all together the development of diabetes is stressed.      Latest Ref Rng & Units 03/08/2024    3:00 PM 08/03/2023   10:50 AM 12/30/2022   11:22 AM 09/12/2022    9:16 AM 07/08/2022    1:44 PM  Diabetic Labs  HbA1c 4.8 - 5.6 % 6.0  5.8  6.3   6.2   Chol 100 - 199 mg/dL   867     HDL >60 mg/dL   45     Calc LDL 0 - 99 mg/dL   67     Triglycerides 0 - 149 mg/dL   887     Creatinine 9.42 - 1.00 mg/dL 9.27  9.18  9.21  9.26        03/08/2024    2:56 PM 03/08/2024    2:01 PM 01/12/2024    9:45 AM 01/12/2024    9:26 AM 08/15/2023   10:40 AM 07/24/2023    9:46 AM 05/22/2023    9:10 AM  BP/Weight  Systolic BP 128 139 102 112 116 126 120  Diastolic BP 82 86 73 77 79 82 80  Wt. (Lbs) 241   241.2 242.12 253.4 246  BMI 40.1 kg/m2   40.76 kg/m2 40.29 kg/m2 40.9 kg/m2 39.71 kg/m2       No data to display          Updated lab needed at/ before next visit.   Urinary incontinence Controlled, no change in medication    "

## 2024-03-09 ENCOUNTER — Encounter: Payer: Self-pay | Admitting: Family Medicine

## 2024-03-09 ENCOUNTER — Ambulatory Visit: Payer: Self-pay | Admitting: Family Medicine

## 2024-03-09 DIAGNOSIS — Z780 Asymptomatic menopausal state: Secondary | ICD-10-CM | POA: Insufficient documentation

## 2024-03-09 DIAGNOSIS — M5416 Radiculopathy, lumbar region: Secondary | ICD-10-CM | POA: Insufficient documentation

## 2024-03-09 LAB — CMP14+EGFR
ALT: 14 IU/L (ref 0–32)
AST: 16 IU/L (ref 0–40)
Albumin: 4.3 g/dL (ref 3.9–4.9)
Alkaline Phosphatase: 95 IU/L (ref 49–135)
BUN/Creatinine Ratio: 21 (ref 12–28)
BUN: 15 mg/dL (ref 8–27)
Bilirubin Total: 0.3 mg/dL (ref 0.0–1.2)
CO2: 22 mmol/L (ref 20–29)
Calcium: 10.3 mg/dL (ref 8.7–10.3)
Chloride: 105 mmol/L (ref 96–106)
Creatinine, Ser: 0.72 mg/dL (ref 0.57–1.00)
Globulin, Total: 2.7 g/dL (ref 1.5–4.5)
Glucose: 94 mg/dL (ref 70–99)
Potassium: 4.4 mmol/L (ref 3.5–5.2)
Sodium: 141 mmol/L (ref 134–144)
Total Protein: 7 g/dL (ref 6.0–8.5)
eGFR: 93 mL/min/1.73

## 2024-03-09 LAB — CBC WITH DIFFERENTIAL/PLATELET
Basophils Absolute: 0.1 x10E3/uL (ref 0.0–0.2)
Basos: 1 %
EOS (ABSOLUTE): 0.3 x10E3/uL (ref 0.0–0.4)
Eos: 2 %
Hematocrit: 43.3 % (ref 34.0–46.6)
Hemoglobin: 14.6 g/dL (ref 11.1–15.9)
Immature Grans (Abs): 0 x10E3/uL (ref 0.0–0.1)
Immature Granulocytes: 0 %
Lymphocytes Absolute: 2 x10E3/uL (ref 0.7–3.1)
Lymphs: 16 %
MCH: 30.9 pg (ref 26.6–33.0)
MCHC: 33.7 g/dL (ref 31.5–35.7)
MCV: 92 fL (ref 79–97)
Monocytes Absolute: 0.9 x10E3/uL (ref 0.1–0.9)
Monocytes: 7 %
Neutrophils Absolute: 9 x10E3/uL — ABNORMAL HIGH (ref 1.4–7.0)
Neutrophils: 74 %
Platelets: 354 x10E3/uL (ref 150–450)
RBC: 4.73 x10E6/uL (ref 3.77–5.28)
RDW: 12.5 % (ref 11.7–15.4)
WBC: 12.2 x10E3/uL — ABNORMAL HIGH (ref 3.4–10.8)

## 2024-03-09 LAB — HEMOGLOBIN A1C
Est. average glucose Bld gHb Est-mCnc: 126 mg/dL
Hgb A1c MFr Bld: 6 % — ABNORMAL HIGH (ref 4.8–5.6)

## 2024-03-09 LAB — TSH: TSH: 0.999 u[IU]/mL (ref 0.450–4.500)

## 2024-03-09 LAB — VITAMIN D 25 HYDROXY (VIT D DEFICIENCY, FRACTURES): Vit D, 25-Hydroxy: 77.6 ng/mL (ref 30.0–100.0)

## 2024-03-09 NOTE — Assessment & Plan Note (Signed)
 Acute LBP to butocks aggarvated by movement, robaxin  is prescribed

## 2024-03-09 NOTE — Assessment & Plan Note (Signed)
" °  Patient re-educated about  the importance of commitment to a  minimum of 150 minutes of exercise per week as able.  The importance of healthy food choices with portion control discussed, as well as eating regularly and within a 12 hour window most days. The need to choose clean , green food 50 to 75% of the time is discussed, as well as to make water the primary drink and set a goal of 64 ounces water daily.       03/08/2024    2:56 PM 01/12/2024    9:26 AM 08/15/2023   10:40 AM  Weight /BMI  Weight 241 lb 241 lb 3.2 oz 242 lb 1.9 oz  Height 5' 5 (1.651 m) 5' 4.5 (1.638 m) 5' 5 (1.651 m)  BMI 40.1 kg/m2 40.76 kg/m2 40.29 kg/m2    Unchanged "

## 2024-03-09 NOTE — Assessment & Plan Note (Addendum)
 Requesting and needing sooner appointment with Psych, message sent

## 2024-03-09 NOTE — Assessment & Plan Note (Signed)
 Controlled, no change in medication

## 2024-03-09 NOTE — Assessment & Plan Note (Signed)
 Patient educated about the importance of limiting  Carbohydrate intake , the need to commit to daily physical activity for a minimum of 30 minutes , and to commit weight loss. The fact that changes in all these areas will reduce or eliminate all together the development of diabetes is stressed.      Latest Ref Rng & Units 03/08/2024    3:00 PM 08/03/2023   10:50 AM 12/30/2022   11:22 AM 09/12/2022    9:16 AM 07/08/2022    1:44 PM  Diabetic Labs  HbA1c 4.8 - 5.6 % 6.0  5.8  6.3   6.2   Chol 100 - 199 mg/dL   867     HDL >60 mg/dL   45     Calc LDL 0 - 99 mg/dL   67     Triglycerides 0 - 149 mg/dL   887     Creatinine 9.42 - 1.00 mg/dL 9.27  9.18  9.21  9.26        03/08/2024    2:56 PM 03/08/2024    2:01 PM 01/12/2024    9:45 AM 01/12/2024    9:26 AM 08/15/2023   10:40 AM 07/24/2023    9:46 AM 05/22/2023    9:10 AM  BP/Weight  Systolic BP 128 139 102 112 116 126 120  Diastolic BP 82 86 73 77 79 82 80  Wt. (Lbs) 241   241.2 242.12 253.4 246  BMI 40.1 kg/m2   40.76 kg/m2 40.29 kg/m2 40.9 kg/m2 39.71 kg/m2       No data to display          Updated lab needed at/ before next visit.

## 2024-03-09 NOTE — Assessment & Plan Note (Signed)
 Controlled, no change in medication DASH diet and commitment to daily physical activity for a minimum of 30 minutes discussed and encouraged, as a part of hypertension management. The importance of attaining a healthy weight is also discussed.     03/08/2024    2:56 PM 03/08/2024    2:01 PM 01/12/2024    9:45 AM 01/12/2024    9:26 AM 08/15/2023   10:40 AM 07/24/2023    9:46 AM 05/22/2023    9:10 AM  BP/Weight  Systolic BP 128 139 102 112 116 126 120  Diastolic BP 82 86 73 77 79 82 80  Wt. (Lbs) 241   241.2 242.12 253.4 246  BMI 40.1 kg/m2   40.76 kg/m2 40.29 kg/m2 40.9 kg/m2 39.71 kg/m2

## 2024-03-09 NOTE — Assessment & Plan Note (Signed)
 Dexa past due appt to be scheduled

## 2024-03-09 NOTE — Assessment & Plan Note (Signed)
 Updated lab needed at/ before next visit.

## 2024-03-11 ENCOUNTER — Telehealth: Payer: Self-pay

## 2024-03-11 MED ORDER — TRAMADOL HCL 50 MG PO TABS: ORAL_TABLET | ORAL | 0 refills | Status: DC

## 2024-03-11 NOTE — Telephone Encounter (Signed)
 Done

## 2024-03-11 NOTE — Telephone Encounter (Signed)
 Copied from CRM #[id number]. Topic: Clinical - Prescription Issue >> Mar 11, 2024 10:38 AM Olam RAMAN wrote: Reason for CRM: medication traMADol  (ULTRAM ) 50 MG tablet  needs to be overide because pt takes zanazx. Needs benzo opiiod overrided [phone]

## 2024-03-22 ENCOUNTER — Telehealth: Payer: Self-pay

## 2024-03-22 NOTE — Telephone Encounter (Signed)
 Copied from CRM #[id number]. Topic: Clinical - Medication Refill >> Mar 22, 2024 11:50 AM Zebedee SAUNDERS wrote: Medication: traMADol  (ULTRAM ) 50 MG tablet, methocarbamol  (ROBAXIN ) 500 MG tablet  Has the patient contacted their pharmacy? Yes (Agent: If no, request that the patient contact the pharmacy for the refill. If patient does not wish to contact the pharmacy document the reason why and proceed with request.) (Agent: If yes, when and what did the pharmacy advise?)  This is the patient's preferred pharmacy:  [facility] - [place], [place] - [address] [place] [address] Phone: [phone] Fax: [phone]  Is this the correct pharmacy for this prescription? Yes If no, delete pharmacy and type the correct one.   Has the prescription been filled recently? Yes  Is the patient out of the medication? Yes  Has the patient been seen for an appointment in the last year OR does the patient have an upcoming appointment? Yes  Can we respond through MyChart? Yes  Agent: Please be advised that Rx refills may take up to 3 business days. We ask that you follow-up with your pharmacy.

## 2024-03-23 MED ORDER — TRAMADOL HCL 50 MG PO TABS: ORAL_TABLET | ORAL | 0 refills | Status: AC

## 2024-03-23 NOTE — Telephone Encounter (Signed)
 Tramadol  is refilled for a limited time, will need eval by Ortho if pain persists, too early for robaxin  to be filled

## 2024-03-23 NOTE — Addendum Note (Signed)
 Addended by: ANTONETTA QUANT E on: 03/23/2024 06:00 AM   Modules accepted: Orders

## 2024-03-25 ENCOUNTER — Telehealth (HOSPITAL_COMMUNITY): Admitting: Psychiatry

## 2024-03-25 ENCOUNTER — Encounter (HOSPITAL_COMMUNITY): Payer: Self-pay | Admitting: Psychiatry

## 2024-03-25 DIAGNOSIS — F329 Major depressive disorder, single episode, unspecified: Secondary | ICD-10-CM

## 2024-03-25 DIAGNOSIS — G4700 Insomnia, unspecified: Secondary | ICD-10-CM

## 2024-03-25 DIAGNOSIS — F411 Generalized anxiety disorder: Secondary | ICD-10-CM | POA: Diagnosis not present

## 2024-03-25 MED ORDER — TRAZODONE HCL 100 MG PO TABS: 100.0000 mg | ORAL_TABLET | Freq: Every day | ORAL | 2 refills | Status: AC

## 2024-03-25 MED ORDER — ALPRAZOLAM 1 MG PO TABS: 1.0000 mg | ORAL_TABLET | Freq: Three times a day (TID) | ORAL | 3 refills | Status: AC

## 2024-03-25 NOTE — Telephone Encounter (Signed)
 Pt made aware.

## 2024-03-25 NOTE — Progress Notes (Signed)
 Virtual Visit via Video Note  I connected with Jenna Woods on 03/25/24 at 11:00 AM EST by a video enabled telemedicine application and verified that I am speaking with the correct person using two identifiers.  Location: Patient: home Provider: office   I discussed the limitations of evaluation and management by telemedicine and the availability of in person appointments. The patient expressed understanding and agreed to proceed.     I discussed the assessment and treatment plan with the patient. The patient was provided an opportunity to ask questions and all were answered. The patient agreed with the plan and demonstrated an understanding of the instructions.   The patient was advised to call back or seek an in-person evaluation if the symptoms worsen or if the condition fails to improve as anticipated.  I provided 20 minutes of non-face-to-face time during this encounter.   Barnie Gull, MD  [facility] MD/PA/NP OP Progress Note  03/25/2024 11:22 AM Jenna Woods  MRN:  [id number]  Chief Complaint:  Chief Complaint  Patient presents with   Anxiety   Follow-up   HPI: This patient is a 63 year old divorced white female who lives alone in [place].  She is on disability.  The patient returns for follow-up after 3 months regarding her major depression generalized anxiety and difficulty with sleep.  She states that her sister passed away in 01/06/2025.  She states that she and her other siblings were not aware that her sister had been sick or hospitalized.  Apparently the sister did not want anyone to know.  They are requesting an autopsy.  She states that since then she has not been sleeping well.  She would like to retry trazodone  as the mirtazapine  is not helping that much.  The Xanax  continues to help with her anxiety.  She is also dealing with chronic arthritic pain and hip pain and recently started a low-dose of tramadol  as well as a muscle relaxer. Visit Diagnosis:     ICD-10-CM   1. Anxiety state  F41.1       Past Psychiatric History: none  Past Medical History:  Past Medical History:  Diagnosis Date   Allergic reaction    Allergy    perrenial    Anxiety    Arthritis    Depression    h/o suicidal ideation in 2011   Depression with anxiety    Diverticulosis of colon 12/19/2015   Diverticulosis of colon with hemorrhage 12/19/2015   GERD (gastroesophageal reflux disease)    Hypertension 1996   Obesity    Peripheral vascular disease    Prediabetes 2013   Raynauds syndrome    Scleroderma (HCC) 1998   Systemic lupus erythematosus (HCC) 1998   Treated at [facility]   Tobacco abuse, in remission    10-pack-years discontinued in 2007   Ulcer    Urinary frequency     Past Surgical History:  Procedure Laterality Date   ABDOMINAL HYSTERECTOMY  1990   Neoplasm   CATARACT EXTRACTION, BILATERAL Left 07/2023   08/2023-Right   CESAREAN SECTION     x2   COLONOSCOPY  2005   COLONOSCOPY WITH PROPOFOL  N/A 08/28/2015   Dr. Golda: scattered medium-mouth diverticula entire colon, external hemorrhoids   COLONOSCOPY WITH PROPOFOL  N/A 07/27/2016   Procedure: COLONOSCOPY WITH PROPOFOL ;  Surgeon: Golda Claudis PENNER, MD;  [facility];  Service: Endoscopy;  Laterality: N/A;   FINGER AMPUTATION     Third finger, bilateral, distal   HAND SURGERY Bilateral 1990  x30   HERNIA REPAIR     INCISIONAL HERNIA REPAIR N/A 01/09/2019   Procedure: HERNIA REPAIR INCISIONAL;  Surgeon: Kallie Manuelita BROCKS, MD;  Location: [facility];  Service: General;  Laterality: N/A;   NASAL SINUS SURGERY  09/06/2011   Procedure: ENDOSCOPIC SINUS SURGERY;  Surgeon: Ana LELON Moccasin, MD;  Location: [facility];  Service: ENT;  Laterality: N/A;  Nasal cerebrospinal fluid leak repair with Left temporalis fascia graft and Left Ear cartilage graft   TUBAL LIGATION     VASCULAR SURGERY     Hands, bilaterally, [facility]; right hand partial amputation of middle finger.     Family Psychiatric History: See below  Family History:  Family History  Problem Relation Age of Onset   Arthritis Mother        Rheumatoid   Dementia Mother    Hypertension Mother    Heart attack Father        Had heart attack in 74.   Cirrhosis Father    Alcohol abuse Father    Lymphoma Brother    Scleroderma Brother    Arthritis Maternal Grandmother    Graves' disease Son    Hyperthyroidism Son    Yvone' disease Son    Colon cancer Neg Hx     Social History:  Social History   Socioeconomic History   Marital status: Single    Spouse name: Not on file   Number of children: 2   Years of education: 12   Highest education level: Some college, no degree  Occupational History   Occupation: Youth Worker: [employer]    Comment: Disability awarded in 1998  Tobacco Use   Smoking status: Former    Current packs/day: 0.00    Average packs/day: 0.3 packs/day for 25.0 years (6.3 ttl pk-yrs)    Types: Cigarettes    Start date: 04/07/1976    Quit date: 04/07/2001    Years since quitting: 22.9    Passive exposure: Past   Smokeless tobacco: Never   Tobacco comments:    quit 88 days ago!!  Vaping Use   Vaping status: Never Used  Substance and Sexual Activity   Alcohol use: No    Comment: occ   Drug use: No    Comment: use to use pot    Sexual activity: Not Currently    Birth control/protection: Surgical    Comment: hyst  Other Topics Concern   Not on file  Social History Narrative   4 grandchildren   Lives alone   Social Drivers of Health   Tobacco Use: Medium Risk (03/25/2024)   Patient History    Smoking Tobacco Use: Former    Smokeless Tobacco Use: Never    Passive Exposure: Past  Physicist, Medical Strain: Low Risk (03/04/2024)   Overall Financial Resource Strain (CARDIA)    Difficulty of Paying Living Expenses: Not very hard  Food Insecurity: No Food Insecurity (03/04/2024)   Epic    Worried About Radiation Protection Practitioner of Food in the  Last Year: Never true    Ran Out of Food in the Last Year: Never true  Transportation Needs: No Transportation Needs (03/04/2024)   Epic    Lack of Transportation (Medical): No    Lack of Transportation (Non-Medical): No  Physical Activity: Insufficiently Active (03/04/2024)   Exercise Vital Sign    Days of Exercise per Week: 3 days    Minutes of Exercise per Session: 30 min  Stress: Stress Concern Present (  03/04/2024)   [employer] - Occupational Stress Questionnaire    Feeling of Stress: To some extent  Social Connections: Unknown (03/04/2024)   Social Connection and Isolation Panel    Frequency of Communication with Friends and Family: More than three times a week    Frequency of Social Gatherings with Friends and Family: More than three times a week    Attends Religious Services: More than 4 times per year    Active Member of Clubs or Organizations: Yes    Attends Banker Meetings: More than 4 times per year    Marital Status: Patient declined  Depression (PHQ2-9): High Risk (03/08/2024)   Depression (PHQ2-9)    PHQ-2 Score: 22  Alcohol Screen: Low Risk (03/04/2024)   Alcohol Screen    Last Alcohol Screening Score (AUDIT): 1  Housing: Unknown (03/04/2024)   Epic    Unable to Pay for Housing in the Last Year: No    Number of Times Moved in the Last Year: Not on file    Homeless in the Last Year: No  Utilities: Not At Risk (05/22/2023)   AHC Utilities    Threatened with loss of utilities: No  Health Literacy: Adequate Health Literacy (05/22/2023)   B1300 Health Literacy    Frequency of need for help with medical instructions: Never    Allergies: Allergies[1]  Metabolic Disorder Labs: Lab Results  Component Value Date   HGBA1C 6.0 (H) 03/08/2024   MPG 119.76 08/03/2023   MPG 123 04/01/2019   No results found for: PROLACTIN Lab Results  Component Value Date   CHOL 132 12/30/2022   TRIG 112 12/30/2022   HDL 45 12/30/2022    CHOLHDL 2.9 12/30/2022   VLDL 15 06/30/2016   LDLCALC 67 12/30/2022   LDLCALC 79 12/28/2021   Lab Results  Component Value Date   TSH 0.999 03/08/2024   TSH 1.160 12/30/2022    Therapeutic Level Labs: No results found for: LITHIUM No results found for: VALPROATE No results found for: CBMZ  Current Medications: Current Outpatient Medications  Medication Sig Dispense Refill   traZODone  (DESYREL ) 100 MG tablet Take 1 tablet (100 mg total) by mouth at bedtime. 30 tablet 2   ALPRAZolam  (XANAX ) 1 MG tablet Take 1 tablet (1 mg total) by mouth 3 (three) times daily. 90 tablet 3   amLODipine  (NORVASC ) 10 MG tablet TAKE 1 Tablet BY MOUTH ONCE DAILY 90 tablet 3   Ascorbic Acid (VITAMIN C) 500 MG CAPS Take 500 mg by mouth daily.     cetirizine  (ZYRTEC ) 10 MG tablet TAKE 1 Tablet BY MOUTH ONCE DAILY 30 tablet 11   Cholecalciferol (VITAMIN D3 PO) Take by mouth.     EPINEPHRINE , ANAPHYLAXIS, IJ Inject as directed.     ferrous sulfate  325 (65 FE) MG tablet Take 325 mg by mouth 1 day or 1 dose. 60 tablet 3   methocarbamol  (ROBAXIN ) 500 MG tablet Take one tablet by mouth two times daily fopr 1 week then as needed 30 tablet 0   mirabegron  ER (MYRBETRIQ ) 50 MG TB24 tablet TAKE 1 Tablet BY MOUTH ONCE DAILY 30 tablet 11   montelukast  (SINGULAIR ) 10 MG tablet TAKE ONE TABLET BY MOUTH (10MG ) AT BEDTIME 30 tablet 6   pantoprazole  (PROTONIX ) 40 MG tablet TAKE 1 Tablet BY MOUTH ONCE DAILY 90 tablet 1   phentermine  (ADIPEX-P ) 37.5 MG tablet Take half tablet by mouth every morning with breakfast (Patient not taking: Reported on 03/08/2024) 30 tablet 1  spironolactone  (ALDACTONE ) 25 MG tablet Take 1 Tablet by mouth once daily 30 tablet PRN   traMADol  (ULTRAM ) 50 MG tablet Take one tablet two times daily , as needed , for uncontrolled  back pain 10 tablet 0   No current facility-administered medications for this visit.     Musculoskeletal: Strength & Muscle Tone: within normal limits Gait & Station:  normal Patient leans: N/A  Psychiatric Specialty Exam: Review of Systems  Musculoskeletal:  Positive for arthralgias and back pain.  Psychiatric/Behavioral:  Positive for sleep disturbance.   All other systems reviewed and are negative.   There were no vitals taken for this visit.There is no height or weight on file to calculate BMI.  General Appearance: Casual and Fairly Groomed  Eye Contact:  Good  Speech:  Clear and Coherent  Volume:  Normal  Mood:  Anxious  Affect:  Congruent  Thought Process:  Goal Directed  Orientation:  Full (Time, Place, and Person)  Thought Content: Rumination   Suicidal Thoughts:  No  Homicidal Thoughts:  No  Memory:  Immediate;   Good Recent;   Good Remote;   NA  Judgement:  Good  Insight:  Good  Psychomotor Activity:  Decreased  Concentration:  Concentration: Good and Attention Span: Good  Recall:  Good  Fund of Knowledge: Good  Language: Good  Akathisia:  No  Handed:  Right  AIMS (if indicated): not done  Assets:  Communication Skills Desire for Improvement Resilience Social Support  ADL's:  Intact  Cognition: WNL  Sleep:  Poor   Screenings: GAD-7    Flowsheet Row Office Visit from 03/08/2024 in [facility] Office Visit from 08/15/2023 in [facility] Office Visit from 12/30/2022 in [facility] Office Visit from 09/12/2022 in [facility] Office Visit from 07/08/2022 in [facility]  Total GAD-7 Score 16 15 1 14 5    PHQ2-9    Flowsheet Row Office Visit from 03/08/2024 in [facility] Office Visit from 08/15/2023 in [facility] Clinical Support from 05/22/2023 in [facility] Office Visit from 05/12/2023 in [facility] Office Visit from 12/30/2022 in [facility]  PHQ-2 Total Score 6 2 0 0 0  PHQ-9 Total Score 22 6 0 --  --   Flowsheet Row ED from 04/01/2022 in [facility] at [facility] Video Visit from 11/01/2021 in [facility] [place] Video Visit from 08/06/2021 in [facility] at [place]  C-SSRS RISK CATEGORY No Risk No Risk No Risk     Assessment and Plan: This patient is a 64 year old female with a history of major depression generalized anxiety and insomnia.  She denies current depression but does seem more anxious and is having trouble sleeping.  We will therefore discontinue mirtazapine  in favor of trazodone  100 mg at bedtime for sleep.  She will continue Xanax  1 mg 3 times daily for anxiety.  She will return to see me in 3 months  Collaboration of Care: Collaboration of Care: Primary Care Provider AEB notes are shared with PCP on the epic system  Patient/Guardian was advised Release of Information must be obtained prior to any record release in order to collaborate their care with an outside provider. Patient/Guardian was advised if they have not already done so to contact the registration department to sign all necessary  forms in order for us  to release information regarding their care.   Consent: Patient/Guardian gives verbal consent for treatment and assignment of benefits for services provided during this visit. Patient/Guardian expressed understanding and agreed to proceed.    Barnie Gull, MD 03/25/2024, 11:22 AM     [1]  Allergies Allergen Reactions   Bee Venom Anaphylaxis   Sesame Oil Anaphylaxis and Swelling    (Sesame seed)   Aspirin      Diverticulosis with bleed   Molds & Smuts    Zofran  [Ondansetron ] Itching    Generalized itching and had a rash on her thighs   Codeine Itching and Rash

## 2024-03-26 ENCOUNTER — Other Ambulatory Visit: Payer: Self-pay | Admitting: Family Medicine

## 2024-03-26 ENCOUNTER — Telehealth: Payer: Self-pay | Admitting: Family Medicine

## 2024-03-26 ENCOUNTER — Telehealth: Payer: Self-pay

## 2024-03-26 ENCOUNTER — Ambulatory Visit: Payer: Self-pay

## 2024-03-26 ENCOUNTER — Other Ambulatory Visit: Payer: Self-pay

## 2024-03-26 DIAGNOSIS — Z1231 Encounter for screening mammogram for malignant neoplasm of breast: Secondary | ICD-10-CM

## 2024-03-26 DIAGNOSIS — M25551 Pain in right hip: Secondary | ICD-10-CM

## 2024-03-26 NOTE — Telephone Encounter (Signed)
 Orders sent

## 2024-03-26 NOTE — Telephone Encounter (Signed)
 FYI Only or Action Required?: Action required by provider: clinical question for provider and update on patient condition.  Patient was last seen in primary care on 03/08/2024 by Antonetta Rollene BRAVO, MD.  Called Nurse Triage reporting Pain.  Symptoms began a week ago.  Interventions attempted: OTC medications: tylenol  and Prescription medications: tramadol ; gabapentin.  Symptoms are: gradually worsening.  Triage Disposition: See PCP When Office is Open (Within 3 Days)  Patient/caregiver understands and will follow disposition?: No, wishes to speak with PCP     Message from Victoria B sent at 03/26/2024 11:38 AM EST  Reason for Triage: severe pain on right side      Reason for Disposition  [1] MODERATE pain (e.g., interferes with normal activities, limping) AND [2] present > 3 days  Answer Assessment - Initial Assessment Questions Pt called to request Robaxin  refill, pt stated that [facility] reached out to her that they were sending an authorization for refill override for PCP to sign then rx could be dispensed. Pt states she understands with insurance if PCP cannot fill but states it does help pain. Pt took Robaxin  this morning around 11am and has one pill remaining at this time. Pt states she is taking tramadol  but breaking tablet in half. Pt take a total of 1 pill daily in two 1/2 doses; pt has 6 pills remaining at this time. Pt states that pain started to worsen in R hip 01/14 when she went to roll out of bed. Pt reports pain is at its worst when she tries to use the restroom, going from standing to sitting then back up. Pt reports she goes straight to her massage chair and uses massage with heat to aid in pain relief. Pt did have home health visit this morning where [facility] nurse did eval and reports A1C was 5.1. Pt states she has been trying to lose weight to aid in A1C but also get weight off of her hips to aid in pain relief as well. Pt does have lift chair so she is able to push  herself up with hands. Discussed d/t going to restroom aggravating pain, if pt could get elevated toilet seat so she isn't sitting so far down then having to push herself back up. Pt states she could easily get product from [facility] and it would be delivered to her. Pt states she is agreeable to any treatment plan PCP recommends. She does have bone density scan scheduled 04/25/24.      1. LOCATION and RADIATION: Where is the pain located? Does the pain spread (shoot) anywhere else?     R hip   2. QUALITY: What does the pain feel like?  (e.g., sharp, dull, aching, burning)     Sharp aching pain  3. SEVERITY: How bad is the pain? What does it keep you from doing?   (Scale 1-10; or mild, moderate, severe)     8-9/10 after taking robaxin ; took around 11am  4. ONSET: When did the pain start? Does it come and go, or is it there all the time?     Ongoing issue since 12/31; seen PCP but worsening 03/20/24  5. WORK OR EXERCISE: Has there been any recent work or exercise that involved this part of the body?      No   6. CAUSE: What do you think is causing the hip pain?      Unknown   7. AGGRAVATING FACTORS: What makes the hip pain worse? (e.g., walking, climbing stairs, running)  Going from standing to sitting back to standing  8. OTHER SYMPTOMS: Do you have any other symptoms? (e.g., back pain, pain shooting down leg,  fever, rash)     Lower back  Protocols used: Hip Pain-A-AH

## 2024-03-26 NOTE — Telephone Encounter (Signed)
 Copied from CRM #[id number]. Topic: General - Other >> Mar 26, 2024 11:38 AM Jenna Woods wrote: Reason for CRM: patient states her pharmacy is sending over form to be signed to be overidden so she can get, the med, Methocarbamol , with no charge. She was given a 30 day supply back on jan 2 which is gone now

## 2024-03-26 NOTE — Telephone Encounter (Signed)
 Copied from CRM [id number]. Topic: General - Other >> Mar 25, 2024  3:38 PM Jenna Woods wrote: Reason for CRM: [facility] in [place] is waiting for patients pcp to sign paperwork before patients appointment in February.

## 2024-03-26 NOTE — Telephone Encounter (Signed)
 Need to see Ortho asap, Beverley millman referral entered as urgent pt to call in amn and is aware of urgent care eval at that office  I spoke with the patgient and she has the contact number to call

## 2024-03-26 NOTE — Telephone Encounter (Signed)
Noted, will wait for form.

## 2024-03-27 ENCOUNTER — Other Ambulatory Visit (HOSPITAL_COMMUNITY): Payer: Self-pay

## 2024-04-09 ENCOUNTER — Other Ambulatory Visit: Payer: Self-pay | Admitting: Family Medicine

## 2024-04-09 ENCOUNTER — Telehealth: Payer: Self-pay

## 2024-04-09 MED ORDER — METHOCARBAMOL 500 MG PO TABS: ORAL_TABLET | ORAL | 2 refills | Status: AC

## 2024-04-11 ENCOUNTER — Telehealth: Payer: Self-pay | Admitting: Obstetrics & Gynecology

## 2024-04-11 NOTE — Telephone Encounter (Signed)
 Pt called to request a refill on prescription Myrbetriq  50mg  at [facility].

## 2024-04-11 NOTE — Telephone Encounter (Signed)
 Patient states she called [facility] and was told she did not have any refills left on her prescription. Called [facility] and technician stated she did have refills.

## 2024-04-16 ENCOUNTER — Ambulatory Visit: Admitting: Internal Medicine

## 2024-05-22 ENCOUNTER — Ambulatory Visit

## 2024-06-06 ENCOUNTER — Encounter: Payer: Self-pay | Admitting: Family Medicine

## 2024-06-24 ENCOUNTER — Telehealth (HOSPITAL_COMMUNITY): Admitting: Psychiatry
